# Patient Record
Sex: Female | Born: 1940 | Race: Black or African American | Hispanic: No | Marital: Married | Smoking: Former smoker
Health system: Southern US, Community
[De-identification: ages and names within clinical notes are randomized; demographics above are authoritative.]

## PROBLEM LIST (undated history)

## (undated) DIAGNOSIS — Z9289 Personal history of other medical treatment: Secondary | ICD-10-CM

## (undated) DIAGNOSIS — E785 Hyperlipidemia, unspecified: Secondary | ICD-10-CM

## (undated) DIAGNOSIS — I209 Angina pectoris, unspecified: Secondary | ICD-10-CM

## (undated) DIAGNOSIS — R002 Palpitations: Secondary | ICD-10-CM

## (undated) DIAGNOSIS — K635 Polyp of colon: Secondary | ICD-10-CM

## (undated) DIAGNOSIS — I509 Heart failure, unspecified: Secondary | ICD-10-CM

## (undated) DIAGNOSIS — I2510 Atherosclerotic heart disease of native coronary artery without angina pectoris: Secondary | ICD-10-CM

## (undated) DIAGNOSIS — F419 Anxiety disorder, unspecified: Secondary | ICD-10-CM

## (undated) DIAGNOSIS — I10 Essential (primary) hypertension: Secondary | ICD-10-CM

## (undated) DIAGNOSIS — N393 Stress incontinence (female) (male): Secondary | ICD-10-CM

## (undated) DIAGNOSIS — M1990 Unspecified osteoarthritis, unspecified site: Secondary | ICD-10-CM

## (undated) DIAGNOSIS — E669 Obesity, unspecified: Secondary | ICD-10-CM

## (undated) DIAGNOSIS — J189 Pneumonia, unspecified organism: Secondary | ICD-10-CM

## (undated) DIAGNOSIS — I351 Nonrheumatic aortic (valve) insufficiency: Secondary | ICD-10-CM

## (undated) DIAGNOSIS — K219 Gastro-esophageal reflux disease without esophagitis: Secondary | ICD-10-CM

## (undated) DIAGNOSIS — M7500 Adhesive capsulitis of unspecified shoulder: Secondary | ICD-10-CM

## (undated) DIAGNOSIS — I428 Other cardiomyopathies: Secondary | ICD-10-CM

## (undated) HISTORY — PX: TUBAL LIGATION: SHX77

## (undated) HISTORY — PX: EYE SURGERY: SHX253

## (undated) HISTORY — PX: OTHER SURGICAL HISTORY: SHX169

## (undated) HISTORY — PX: CARDIAC CATHETERIZATION: SHX172

## (undated) HISTORY — DX: Hyperlipidemia, unspecified: E78.5

## (undated) HISTORY — PX: APPENDECTOMY: SHX54

## (undated) HISTORY — DX: Personal history of other medical treatment: Z92.89

## (undated) HISTORY — DX: Other cardiomyopathies: I42.8

## (undated) HISTORY — DX: Essential (primary) hypertension: I10

## (undated) HISTORY — DX: Adhesive capsulitis of unspecified shoulder: M75.00

## (undated) HISTORY — DX: Unspecified osteoarthritis, unspecified site: M19.90

## (undated) HISTORY — DX: Obesity, unspecified: E66.9

## (undated) HISTORY — PX: HAND SURGERY: SHX662

## (undated) HISTORY — DX: Palpitations: R00.2

## (undated) HISTORY — PX: ABDOMINAL HYSTERECTOMY: SHX81

## (undated) HISTORY — DX: Atherosclerotic heart disease of native coronary artery without angina pectoris: I25.10

## (undated) HISTORY — DX: Nonrheumatic aortic (valve) insufficiency: I35.1

## (undated) HISTORY — DX: Polyp of colon: K63.5

## (undated) HISTORY — DX: Heart failure, unspecified: I50.9

---

## 1983-06-26 HISTORY — PX: BLADDER NECK SUSPENSION: SHX1240

## 1995-06-06 ENCOUNTER — Encounter: Payer: Self-pay | Admitting: Cardiovascular Disease

## 1998-09-12 ENCOUNTER — Other Ambulatory Visit: Admission: RE | Admit: 1998-09-12 | Discharge: 1998-09-12 | Payer: Self-pay | Admitting: Family Medicine

## 1998-10-10 ENCOUNTER — Ambulatory Visit (HOSPITAL_COMMUNITY): Admission: RE | Admit: 1998-10-10 | Discharge: 1998-10-10 | Payer: Self-pay | Admitting: *Deleted

## 1998-10-10 ENCOUNTER — Encounter: Payer: Self-pay | Admitting: *Deleted

## 1999-08-29 ENCOUNTER — Other Ambulatory Visit: Admission: RE | Admit: 1999-08-29 | Discharge: 1999-08-29 | Payer: Self-pay | Admitting: Family Medicine

## 1999-11-20 ENCOUNTER — Encounter: Payer: Self-pay | Admitting: Family Medicine

## 1999-11-20 ENCOUNTER — Encounter: Admission: RE | Admit: 1999-11-20 | Discharge: 1999-11-20 | Payer: Self-pay | Admitting: Family Medicine

## 2000-09-24 ENCOUNTER — Other Ambulatory Visit: Admission: RE | Admit: 2000-09-24 | Discharge: 2000-09-24 | Payer: Self-pay | Admitting: Family Medicine

## 2001-04-24 ENCOUNTER — Encounter: Payer: Self-pay | Admitting: Family Medicine

## 2001-04-24 ENCOUNTER — Encounter: Admission: RE | Admit: 2001-04-24 | Discharge: 2001-04-24 | Payer: Self-pay | Admitting: Family Medicine

## 2001-09-25 ENCOUNTER — Other Ambulatory Visit: Admission: RE | Admit: 2001-09-25 | Discharge: 2001-09-25 | Payer: Self-pay | Admitting: Family Medicine

## 2002-05-04 ENCOUNTER — Encounter: Admission: RE | Admit: 2002-05-04 | Discharge: 2002-05-04 | Payer: Self-pay | Admitting: Orthopedic Surgery

## 2002-05-04 ENCOUNTER — Encounter: Payer: Self-pay | Admitting: Orthopedic Surgery

## 2002-06-09 ENCOUNTER — Encounter: Payer: Self-pay | Admitting: Family Medicine

## 2002-06-09 ENCOUNTER — Encounter: Admission: RE | Admit: 2002-06-09 | Discharge: 2002-06-09 | Payer: Self-pay | Admitting: Family Medicine

## 2002-10-08 ENCOUNTER — Other Ambulatory Visit: Admission: RE | Admit: 2002-10-08 | Discharge: 2002-10-08 | Payer: Self-pay | Admitting: Family Medicine

## 2003-12-11 ENCOUNTER — Encounter: Admission: RE | Admit: 2003-12-11 | Discharge: 2003-12-11 | Payer: Self-pay | Admitting: Specialist

## 2003-12-23 ENCOUNTER — Other Ambulatory Visit: Admission: RE | Admit: 2003-12-23 | Discharge: 2003-12-23 | Payer: Self-pay | Admitting: Family Medicine

## 2003-12-23 ENCOUNTER — Encounter: Admission: RE | Admit: 2003-12-23 | Discharge: 2003-12-23 | Payer: Self-pay | Admitting: Family Medicine

## 2004-01-15 ENCOUNTER — Emergency Department (HOSPITAL_COMMUNITY): Admission: EM | Admit: 2004-01-15 | Discharge: 2004-01-15 | Payer: Self-pay | Admitting: Emergency Medicine

## 2004-02-09 ENCOUNTER — Encounter: Admission: RE | Admit: 2004-02-09 | Discharge: 2004-02-09 | Payer: Self-pay | Admitting: Family Medicine

## 2004-12-25 ENCOUNTER — Other Ambulatory Visit: Admission: RE | Admit: 2004-12-25 | Discharge: 2004-12-25 | Payer: Self-pay | Admitting: Family Medicine

## 2005-01-22 ENCOUNTER — Encounter: Admission: RE | Admit: 2005-01-22 | Discharge: 2005-01-22 | Payer: Self-pay | Admitting: Family Medicine

## 2005-03-02 ENCOUNTER — Ambulatory Visit: Admission: RE | Admit: 2005-03-02 | Discharge: 2005-03-02 | Payer: Self-pay | Admitting: Family Medicine

## 2005-03-14 ENCOUNTER — Encounter: Admission: RE | Admit: 2005-03-14 | Discharge: 2005-03-14 | Payer: Self-pay | Admitting: Orthopaedic Surgery

## 2005-06-29 ENCOUNTER — Encounter: Admission: RE | Admit: 2005-06-29 | Discharge: 2005-08-13 | Payer: Self-pay | Admitting: Orthopaedic Surgery

## 2005-07-13 ENCOUNTER — Ambulatory Visit (HOSPITAL_COMMUNITY): Admission: RE | Admit: 2005-07-13 | Discharge: 2005-07-13 | Payer: Self-pay | Admitting: Orthopaedic Surgery

## 2006-02-12 ENCOUNTER — Encounter: Admission: RE | Admit: 2006-02-12 | Discharge: 2006-02-12 | Payer: Self-pay | Admitting: Family Medicine

## 2007-02-14 ENCOUNTER — Encounter: Admission: RE | Admit: 2007-02-14 | Discharge: 2007-02-14 | Payer: Self-pay | Admitting: Family Medicine

## 2007-02-26 ENCOUNTER — Encounter: Admission: RE | Admit: 2007-02-26 | Discharge: 2007-02-26 | Payer: Self-pay | Admitting: Family Medicine

## 2008-04-20 ENCOUNTER — Encounter: Admission: RE | Admit: 2008-04-20 | Discharge: 2008-04-20 | Payer: Self-pay | Admitting: Family Medicine

## 2008-10-26 DIAGNOSIS — M159 Polyosteoarthritis, unspecified: Secondary | ICD-10-CM

## 2009-01-19 ENCOUNTER — Ambulatory Visit: Payer: Self-pay | Admitting: Gastroenterology

## 2009-02-01 ENCOUNTER — Encounter: Payer: Self-pay | Admitting: Gastroenterology

## 2009-02-01 ENCOUNTER — Ambulatory Visit: Payer: Self-pay | Admitting: Gastroenterology

## 2009-02-01 DIAGNOSIS — K635 Polyp of colon: Secondary | ICD-10-CM

## 2009-02-01 HISTORY — DX: Polyp of colon: K63.5

## 2009-02-03 ENCOUNTER — Encounter: Payer: Self-pay | Admitting: Gastroenterology

## 2009-04-21 ENCOUNTER — Encounter: Admission: RE | Admit: 2009-04-21 | Discharge: 2009-04-21 | Payer: Self-pay | Admitting: Family Medicine

## 2009-05-16 DIAGNOSIS — N318 Other neuromuscular dysfunction of bladder: Secondary | ICD-10-CM

## 2009-05-23 ENCOUNTER — Encounter: Admission: RE | Admit: 2009-05-23 | Discharge: 2009-05-23 | Payer: Self-pay | Admitting: Family Medicine

## 2009-09-04 ENCOUNTER — Inpatient Hospital Stay (HOSPITAL_COMMUNITY): Admission: EM | Admit: 2009-09-04 | Discharge: 2009-09-08 | Payer: Self-pay | Admitting: Emergency Medicine

## 2009-09-04 ENCOUNTER — Ambulatory Visit: Payer: Self-pay | Admitting: Cardiology

## 2009-09-05 ENCOUNTER — Encounter (INDEPENDENT_AMBULATORY_CARE_PROVIDER_SITE_OTHER): Payer: Self-pay | Admitting: Internal Medicine

## 2009-09-09 ENCOUNTER — Encounter: Payer: Self-pay | Admitting: Cardiology

## 2009-09-28 ENCOUNTER — Ambulatory Visit: Payer: Self-pay | Admitting: Cardiology

## 2009-09-28 DIAGNOSIS — I5032 Chronic diastolic (congestive) heart failure: Secondary | ICD-10-CM

## 2009-09-28 DIAGNOSIS — I359 Nonrheumatic aortic valve disorder, unspecified: Secondary | ICD-10-CM | POA: Insufficient documentation

## 2009-09-28 DIAGNOSIS — I2510 Atherosclerotic heart disease of native coronary artery without angina pectoris: Secondary | ICD-10-CM | POA: Insufficient documentation

## 2009-10-27 ENCOUNTER — Ambulatory Visit: Payer: Self-pay | Admitting: Cardiology

## 2009-11-01 LAB — CONVERTED CEMR LAB
AST: 14 units/L (ref 0–37)
Alkaline Phosphatase: 50 units/L (ref 39–117)
Bilirubin, Direct: 0.2 mg/dL (ref 0.0–0.3)
Cholesterol: 125 mg/dL (ref 0–200)
LDL Cholesterol: 53 mg/dL (ref 0–99)
Total CHOL/HDL Ratio: 2

## 2009-11-16 ENCOUNTER — Ambulatory Visit: Payer: Self-pay | Admitting: Cardiology

## 2009-11-24 ENCOUNTER — Telehealth: Payer: Self-pay | Admitting: Cardiology

## 2009-11-30 ENCOUNTER — Ambulatory Visit: Payer: Self-pay | Admitting: Cardiology

## 2009-11-30 DIAGNOSIS — R0989 Other specified symptoms and signs involving the circulatory and respiratory systems: Secondary | ICD-10-CM

## 2009-11-30 DIAGNOSIS — I517 Cardiomegaly: Secondary | ICD-10-CM

## 2009-11-30 DIAGNOSIS — R0609 Other forms of dyspnea: Secondary | ICD-10-CM | POA: Insufficient documentation

## 2009-12-06 LAB — CONVERTED CEMR LAB
BUN: 19 mg/dL (ref 6–23)
CO2: 31 meq/L (ref 19–32)
Creatinine, Ser: 0.6 mg/dL (ref 0.4–1.2)
GFR calc non Af Amer: 130.05 mL/min (ref >60)
Glucose, Bld: 107 mg/dL — ABNORMAL HIGH (ref 70–99)

## 2010-02-28 ENCOUNTER — Ambulatory Visit: Payer: Self-pay | Admitting: Internal Medicine

## 2010-02-28 ENCOUNTER — Ambulatory Visit: Payer: Self-pay

## 2010-02-28 ENCOUNTER — Encounter: Payer: Self-pay | Admitting: Cardiology

## 2010-02-28 ENCOUNTER — Ambulatory Visit (HOSPITAL_COMMUNITY): Admission: RE | Admit: 2010-02-28 | Discharge: 2010-02-28 | Payer: Self-pay | Admitting: Cardiology

## 2010-03-07 ENCOUNTER — Ambulatory Visit: Payer: Self-pay | Admitting: Cardiology

## 2010-03-07 DIAGNOSIS — I10 Essential (primary) hypertension: Secondary | ICD-10-CM

## 2010-03-07 DIAGNOSIS — E785 Hyperlipidemia, unspecified: Secondary | ICD-10-CM | POA: Insufficient documentation

## 2010-03-08 LAB — CONVERTED CEMR LAB
Chloride: 106 meq/L (ref 96–112)
GFR calc non Af Amer: 108.47 mL/min (ref >60)
Sodium: 142 meq/L (ref 135–145)

## 2010-03-23 DIAGNOSIS — I428 Other cardiomyopathies: Secondary | ICD-10-CM

## 2010-04-25 ENCOUNTER — Encounter: Admission: RE | Admit: 2010-04-25 | Discharge: 2010-04-25 | Payer: Self-pay | Admitting: Family Medicine

## 2010-04-26 ENCOUNTER — Ambulatory Visit: Payer: Self-pay | Admitting: Cardiology

## 2010-05-01 LAB — CONVERTED CEMR LAB
ALT: 12 units/L (ref 0–35)
Alkaline Phosphatase: 59 units/L (ref 39–117)
Cholesterol: 157 mg/dL (ref 0–200)
HDL: 60.5 mg/dL (ref >39.00)
LDL Cholesterol: 71 mg/dL (ref 0–99)
Triglycerides: 128 mg/dL (ref 0.0–149.0)
VLDL: 25.6 mg/dL (ref 0.0–40.0)

## 2010-05-08 ENCOUNTER — Ambulatory Visit: Payer: Self-pay | Admitting: Cardiology

## 2010-06-22 DIAGNOSIS — I10 Essential (primary) hypertension: Secondary | ICD-10-CM

## 2010-06-22 DIAGNOSIS — I2510 Atherosclerotic heart disease of native coronary artery without angina pectoris: Secondary | ICD-10-CM

## 2010-06-22 DIAGNOSIS — M949 Disorder of cartilage, unspecified: Secondary | ICD-10-CM

## 2010-06-22 DIAGNOSIS — M899 Disorder of bone, unspecified: Secondary | ICD-10-CM

## 2010-06-27 DIAGNOSIS — R7302 Impaired glucose tolerance (oral): Secondary | ICD-10-CM | POA: Insufficient documentation

## 2010-06-27 DIAGNOSIS — E119 Type 2 diabetes mellitus without complications: Secondary | ICD-10-CM | POA: Insufficient documentation

## 2010-07-23 LAB — CONVERTED CEMR LAB
CO2: 33 meq/L — ABNORMAL HIGH (ref 19–32)
Chloride: 102 meq/L (ref 96–112)
Glucose, Bld: 89 mg/dL (ref 70–99)
Pro B Natriuretic peptide (BNP): 75 pg/mL (ref 0.0–100.0)
Sodium: 141 meq/L (ref 135–145)

## 2010-07-27 NOTE — Assessment & Plan Note (Signed)
Summary: eph/per aft hrs sheet/jss   Visit Type:  eph Primary Provider:  Dr Maryelizabeth Rowan   History of Present Illness: 70 yo with history of HTN and obesity was admitted to [facility] in 3/11 with atypical chest pain and palpitations.  She ended up having a heart catheterization that showed nonobstructive CAD, but EF was decreased at 40%.  She was also diagnosed with diabetes. Her palpitations seemed to correlate with PACs on telemetry.  SInce discharge, she has done well.  No further chest pain.  She can walk on flat ground without getting short of breath but is short of breath if she walks up steps or a hill.  She is not very active.  She has been taking glipizide and blood glucose is reasonably controlled.    ECG: NSR, LVH, nonspecific anterolateral T wave flattening  Labs (3/11): K 4.3, creatinine 0.69, BNP 41, LDL 82, HDL 49  Current Medications (verified): 1)  Aspirin Ec 325 Mg Tbec (Aspirin) .... Take One Tablet By Mouth Daily 2)  Coreg 3.125 Mg Tabs (Carvedilol) .... One Tab Two Times A Day 3)  Furosemide 20 Mg Tabs (Furosemide) .... Once Daily 4)  Glipizide 5 Mg Tabs (Glipizide) .... Once Daily 5)  Lisinopril 5 Mg Tabs (Lisinopril) .... Once Daily 6)  Simvastatin 40 Mg Tabs (Simvastatin) .... Once Daily At Bedtime 7)  Calcium Carbonate 600 Mg Tabs (Calcium Carbonate) .... Once Daily 8)  Nixion .... Once Daily For The Hair 9)  Vit.d 500 .... Once Daily  Allergies: 1)  ! Iodine 2)  ! Penicillin  Past History:  Past Medical History: 1. Frozen right shoulder 2. Obesity 3. HTN 4. Diabetes 5. Hyperlipidemia 6. Palpitations: PACs noted on telemetry while in the hospital 7. Nonischemic cardiomyopathy: Echo (3/11): Difficult study, moderate aortic insufficiency noted.  Left ventriculogram (3/11): EF 40%, global hypokinesis.  8. Osteoarthritis 9. CAD (nonobstructive): Left heart cath (3/11): 40% ostial D1, 30% mid CFX.    Family History: Mother: CVA at 80 Siblings:  sister has diabetes and heart disease. Siblings: sister who is deceased in her 36s of an MI. Siblings: brother with  diabetes Father: deceased at the age of 13 with an MI.  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  Tobacco Use - No, quit 1970s.  Alcohol Use - yes Drug Use - no  Review of Systems       All systems reviewed and negative except as per HPI.   Vital Signs:  Patient profile:   70 year old female Height:      60 inches Weight:      196 pounds BMI:     38.42 Pulse rate:   62 / minute BP sitting:   111 / 59  (left arm) Cuff size:   large  Vitals Entered By: Oswald Hillock (September 28, 2009 11:34 AM)  Physical Exam  General:  Well developed, well nourished, in no acute distress.  Morbidly obese.  Head:  normocephalic and atraumatic Nose:  no deformity, discharge, inflammation, or lesions Mouth:  Teeth, gums and palate normal. Oral mucosa normal. Neck:  Neck thick, no JVD. No masses, thyromegaly or abnormal cervical nodes. Lungs:  Clear bilaterally to auscultation and percussion. Heart:  Non-displaced PMI, chest non-tender; regular rate and rhythm, S1, S2 without murmurs, rubs or gallops. Carotid upstroke normal, no bruit. Pedals normal pulses. Trace ankle edema.  Abdomen:  Bowel sounds positive; abdomen soft and non-tender without masses, organomegaly, or hernias noted. No  hepatosplenomegaly.  Obese.  Msk:  Back normal, normal gait. Muscle strength and tone normal. Extremities:  No clubbing or cyanosis. Neurologic:  Alert and oriented x 3. Skin:  Intact without lesions or rashes. Psych:  Normal affect.   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  Echo was a difficult study.  I certainly think that a significant part of the patient's dyspnea can be explained by obesity and deconditioning.  She will continue lisinopril 5 mg daily and  increase Coreg to 6.25 mg two times a day.  Will get echo in 9/11 to followup on EF and aortic insufficiency.   I have encouraged her to exercise more.  Will check BNP.   Problem # 2:  AORTIC REGURGITATION (ICD-424.1) Moderate on echo in 3/11.  Followup echo in 9/11.   Problem # 3:  CAD (ICD-414.00) Nonobstructive CAD on cath.  OK to decrease ASA to 81 mg daily.  Check lipids and LFTs in 5/11, would like LDL around 70.    Other Orders: Echocardiogram (Echo) TLB-BNP (B-Natriuretic Peptide) (83880-BNPR) TLB-BMP (Basic Metabolic Panel-BMET) (80048-METABOL)  Patient Instructions: 1)  Your physician has recommended you make the following change in your medication:  2)  Decrease Aspirin to 81mg  daily 3)  Increase Coreg to 6.25mg  twice a day 4)  Your physician recommends that you have  lab work today--BMP/BNP 428.22 5)  Your physician recommends that you return for a FASTING lipid profile/liver profile 428.22----MAY 2011 6)  Your physician recommends that you schedule a follow-up appointment in  September 2011 with Dr Veronda Prude few days after you have the echocardiogram. 7)  Your physician has requested that you have an echocardiogram.  Echocardiography is a painless test that uses sound waves to create images of your heart. It provides your doctor with information about the size and shape of your heart and how well your heart's chambers and valves are working.  This procedure takes approximately one hour. There are no restrictions for this procedure. September 2011 Prescriptions: GLIPIZIDE 5 MG TABS (GLIPIZIDE) once daily  #30 x 0   Entered by:   Katina Dung, RN, BSN   Authorized by:   Marca Ancona, MD   Signed by:   Katina Dung, RN, BSN on 09/28/2009   Method used:   Electronically to        [facility]. [phone]* (retail)       [address] [place] [address]       [place], [place]  [address]       Ph: [phone]       Fax: [phone]   RxID:    [id number] SIMVASTATIN 40 MG TABS (SIMVASTATIN) once daily at bedtime  #30 x 6   Entered by:   Katina Dung, RN, BSN   Authorized by:   Marca Ancona, MD   Signed by:   Katina Dung, RN, BSN on 09/28/2009   Method used:   Electronically to        [facility]. [phone]* (retail)       [address]       [place], [place]  [address]       Ph: [phone]       Fax: [phone]   RxID:   [id number] LISINOPRIL 5 MG TABS (LISINOPRIL) once daily  #30 x 6   Entered by:  Katina Dung, RN, BSN   Authorized by:   Marca Ancona, MD   Signed by:   Katina Dung, RN, BSN on 09/28/2009   Method used:   Electronically to        [facility] [phone]* (retail)       [address]       [place], [place]  [address]       Ph: [phone]       Fax: [phone]   RxID:   [id number] FUROSEMIDE 20 MG TABS (FUROSEMIDE) once daily  #30 x 6   Entered by:   Katina Dung, RN, BSN   Authorized by:   Marca Ancona, MD   Signed by:   Katina Dung, RN, BSN on 09/28/2009   Method used:   Electronically to        [facility] [phone]* (retail)       [address]       [place], [place]  [address]       Ph: [phone]       Fax: [phone]   RxID:   [id number] COREG 6.25 MG TABS (CARVEDILOL) one tablet twice a day  #60 x 6   Entered by:   Katina Dung, RN, BSN   Authorized by:   Marca Ancona, MD   Signed by:   Katina Dung, RN, BSN on 09/28/2009   Method used:   Electronically to        [facility] [phone]* (retail)       [address]       [place], [place]  [address]       Ph: [phone]       Fax: [phone]   RxID:   [id number]

## 2010-07-27 NOTE — Assessment & Plan Note (Signed)
Summary: F/U ECHO   Primary Provider:  Dr Maryelizabeth Rowan  CC:  f/u echo.  Pt believes Sertraline is making her itch.  .  History of Present Illness: 70 yo with history of HTN and obesity was admitted to [facility] in 3/11 with atypical chest pain and palpitations.  She ended up having a heart catheterization that showed nonobstructive CAD, but EF was decreased at 40%.  She was also diagnosed with diabetes. Her palpitations seemed to correlate with PACs on telemetry.  Echo in 9/11 has actually shown recovery of LV systolic function (EF 55-60%).  She is now taking losartan because she had a cough with lisinopril.    Patient is feeling better.  She has started going to [employer] and has been doing the exercise bike 3 times a week.  It is hard for her to walk due to her knee pain.  She is walking on flat ground without exertional dyspnea.  No chest pain.  Unfortunately, she has gained 7 lbs since last appointment.   Labs (3/11): K 4.3, creatinine 0.69, BNP 41, LDL 82, HDL 49 Labs (4/11): K 4.3, creatinine 0.7, BNP 75 Labs (5/11): LDL 53, HDL 52, LFTs normal Labs (1/61): creatinine 0.6, K 4.5  Current Medications (verified): 1)  Aspir-Low 81 Mg Tbec (Aspirin) .... One Daily 2)  Coreg 6.25 Mg Tabs (Carvedilol) .... One Tablet Twice A Day 3)  Simvastatin 40 Mg Tabs (Simvastatin) .... Once Daily At Bedtime 4)  Calcium Carbonate 600 Mg Tabs (Calcium Carbonate) .... Once Daily 5)  Vit.d 500 .... Once Daily 6)  Furosemide 20 Mg Tabs (Furosemide) .... One Daily 7)  Klor-Con M20 20 Meq Cr-Tabs (Potassium Chloride Crys Cr) .... 1/2 Tablet Once Daily 8)  Losartan Potassium 25 Mg Tabs (Losartan Potassium) .... Take 1/2 Tablet Once Daily 9)  Sertraline Hcl 100 Mg Tabs (Sertraline Hcl) .... Take One Tablet Once Daily  Allergies (verified): 1)  ! Iodine 2)  ! Penicillin  Past History:  Past Medical History: 1. Frozen shoulder 2. Obesity 3. HTN: ACEI cough.  4. Diabetes 5. Hyperlipidemia 6.  Palpitations: PACs noted on telemetry while in the hospital 7. Nonischemic cardiomyopathy: Echo (3/11): Difficult study, moderate aortic insufficiency noted.  Left ventriculogram (3/11): EF 40%, global hypokinesis.  Echo (9/11): EF 55-60%, moderate diastolic dysfunction, mild aortic insufficiency.  8. Osteoarthritis 9. CAD (nonobstructive): Left heart cath (3/11): 40% ostial D1, 30% mid CFX.   10. Aortic insufficiency: Mild on 9/11 cath.   Family History: Reviewed history from 09/28/2009 and no changes required. Mother: CVA at 33 Siblings: sister has diabetes and heart disease. Siblings: sister who is deceased in her 32s of an MI. Siblings: brother with  diabetes Father: deceased at the age of 24 with an MI.  Social History: Reviewed history from 09/28/2009 and no changes required. 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  Tobacco Use - No, quit 1970s.  Alcohol Use - yes Drug Use - no  Review of Systems       All systems reviewed and negative except as per HPI.   Vital Signs:  Patient profile:   70 year old female Height:      60 inches Weight:      227 pounds BMI:     44.49 Pulse rate:   64 / minute Pulse rhythm:   regular BP sitting:   118 / 60  (left arm) Cuff size:   large  Vitals Entered By: Judithe Modest CMA (March 07, 2010 9:29  AM)  Physical Exam  General:  Well developed, well nourished, in no acute distress.  Morbidly obese.  Mouth:  post nasal drip.   Neck:  Neck thick, JVP difficult. No masses, thyromegaly or abnormal cervical nodes. Lungs:  Clear bilaterally to auscultation and percussion. Heart:  Non-displaced PMI, chest non-tender; regular rate and rhythm, S1, S2 without rubs or gallops. 2/6 systolic murmur RUSB.  Carotid upstroke normal, no bruit. Pedals normal pulses. No edema.  Abdomen:  Bowel sounds positive; abdomen soft and non-tender without masses, organomegaly, or hernias noted. No hepatosplenomegaly. Extremities:  No clubbing  or cyanosis. Neurologic:  Alert and oriented x 3.   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 This may be due to long-standing HTN and DM.   I certainly think that a significant part of the patient's dyspnea and exhaustion can be explained by obesity and deconditioning.  Neck veins are difficult to evaluate.  Most recent echo showed improvement in EF (55-60%).  I will have her continue current doses of Coreg,  Losartan, and Lasix. BMET today to assess renal function on cardiac meds.   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   Problem # 3:  HYPERLIPIDEMIA-MIXED (ICD-272.4) Lipids/LFTs in 11/11.  LDL should be at least below 100.   Problem # 4:  HYPERTENSION, UNSPECIFIED (ICD-401.9) BP is well-controlled today.   Other Orders: TLB-BMP (Basic Metabolic Panel-BMET) (80048-METABOL)  Patient Instructions: 1)  Your physician recommends that you schedule a follow-up appointment in: 6 MONTHS WITH DR [facility] 2)  Your physician recommends that you return for lab work ZO:XWRUE BMET 428.32 AND NOV 2012 FASTIN LIPID LIVER 272.4 V58.69 3)  Your physician recommends that you continue on your current medications as directed. Please refer to the Current Medication list given to you today.

## 2010-07-27 NOTE — Assessment & Plan Note (Signed)
Summary: ROV / appt is 11:45/gd   Primary Provider:  Dr Maryelizabeth Rowan   History of Present Illness: 70 yo with history of HTN and obesity was admitted to [facility] in 3/11 with atypical chest pain and palpitations.  She ended up having a heart catheterization that showed nonobstructive CAD, but EF was decreased at 40%.  She was also diagnosed with diabetes. Her palpitations seemed to correlate with PACs on telemetry.    Today patient reports chest heaviness both with and without exertion.  This does seem to be associated with left shoulder pain (she has a frozen shoulder with musculoskeletal shoulder pain).  She is easily fatigued and feels exhausted doing housework such as making up a bed.  She is not very active (significantly limited by her knee osteoarthritis).    ECG: NSR, LVH, nonspecific anterolateral T wave flattening  Labs (3/11): K 4.3, creatinine 0.69, BNP 41, LDL 82, HDL 49 Labs (4/11): K 4.3, creatinine 0.7, BNP 75 Labs (5/11): LDL 53, HDL 52, LFTs normal  Current Medications (verified): 1)  Aspir-Low 81 Mg Tbec (Aspirin) .... One Daily 2)  Coreg 6.25 Mg Tabs (Carvedilol) .... One Tablet Twice A Day 3)  Furosemide 20 Mg Tabs (Furosemide) .... Once Daily 4)  Glipizide 5 Mg Tabs (Glipizide) .... Once Daily 5)  Lisinopril 5 Mg Tabs (Lisinopril) .... Once Daily 6)  Simvastatin 40 Mg Tabs (Simvastatin) .... Once Daily At Bedtime 7)  Calcium Carbonate 600 Mg Tabs (Calcium Carbonate) .... Once Daily 8)  Vit.d 500 .... Once Daily  Allergies (verified): 1)  ! Iodine 2)  ! Penicillin  Past History:  Past Medical History: 1. Frozen shoulder 2. Obesity 3. HTN 4. Diabetes 5. Hyperlipidemia 6. Palpitations: PACs noted on telemetry while in the hospital 7. Nonischemic cardiomyopathy: Echo (3/11): Difficult study, moderate aortic insufficiency noted.  Left ventriculogram (3/11): EF 40%, global hypokinesis.  8. Osteoarthritis 9. CAD (nonobstructive): Left heart cath (3/11):  40% ostial D1, 30% mid CFX.   10.  Moderate aortic insufficiency  Family History: Reviewed history from 09/28/2009 and no changes required. Mother: CVA at 18 Siblings: sister has diabetes and heart disease. Siblings: sister who is deceased in her 76s of an MI. Siblings: brother with  diabetes Father: deceased at the age of 70 with an MI.  Social History: Reviewed history from 09/28/2009 and no changes required. 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  Tobacco Use - No, quit 1970s.  Alcohol Use - yes Drug Use - no  Review of Systems       All systems reviewed and negative except as per HPI.   Vital Signs:  Patient profile:   70 year old female Height:      60 inches Weight:      220 pounds BMI:     43.12 Pulse rate:   63 / minute Pulse rhythm:   regular BP sitting:   95 / 55  (left arm) Cuff size:   regular  Vitals Entered By: Judithe Modest CMA (Nov 16, 2009 12:09 PM)  Physical Exam  General:  Well developed, well nourished, in no acute distress.  Morbidly obese.  Neck:  Neck thick, JVP difficult. No masses, thyromegaly or abnormal cervical nodes. Lungs:  Clear bilaterally to auscultation and percussion. Heart:  Non-displaced PMI, chest non-tender; regular rate and rhythm, S1, S2 without rubs or gallops. 2/6 systolic murmur RUSB.  Carotid upstroke normal, no bruit. Pedals normal pulses. 1+ edema 1/2 up lower legs bilaterally.  Abdomen:  Bowel sounds positive; abdomen soft and non-tender without masses, organomegaly, or hernias noted. No hepatosplenomegaly.  Obese.  Extremities:  No clubbing or cyanosis. Neurologic:  Alert and oriented x 3. Psych:  Normal affect.   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  Echo was a difficult study.  I certainly think that a significant part of the patient's dyspnea and exhaustion can be explained by obesity and  deconditioning.  Neck veins are difficult to evaluate.  She does, however, have increased lower extremity edema compared to prior.  I will have her increase her Lasix to 40 mg daily and take KCl 20 mEq daily to see if this makes any difference in her symptoms.  She needs weight loss.  Continue current doses of lisinopril and Coreg (will not titrate up given low blood pressure).  BMET in 2 wks.   Problem # 2:  AORTIC REGURGITATION (ICD-424.1) Moderate on echo in 3/11.  Followup echo in 9/11.  BP is controlled.   Problem # 3:  CAD (ICD-414.00) Nonobstructive CAD on cath.  Continue ASA.  Lipids/LFTs at goal.  I suspect that the chest heaviness she gets is not ischemic in nature.  It may be musculoskeletal and referred from her painful shoulder.   Patient Instructions: 1)  Your physician has recommended you make the following change in your medication:  2)  Increase Lasix(furosemide) to 40mg  daily 3)  Start KCL(potassium) daily 4)  Your physician recommends that you return for lab work in: 2 weeks--BMP 786.09   429.3 5)  Your physician has requested that you have an echocardiogram.  Echocardiography is a painless test that uses sound waves to create images of your heart. It provides your doctor with information about the size and shape of your heart and how well your heart's chambers and valves are working.  This procedure takes approximately one hour. There are no restrictions for this procedure. This is scheduled for September 6,2011 at 3pm. 6)  Your physician recommends that you schedule a follow-up appointment with Dr Shirlee Latch in September after the echo has been done. Prescriptions: KLOR-CON M20 20 MEQ CR-TABS (POTASSIUM CHLORIDE CRYS CR) one tablet daily  #30 x 6   Entered by:   Katina Dung, RN, BSN   Authorized by:   Marca Ancona, MD   Signed by:   Katina Dung, RN, BSN on 11/16/2009   Method used:   Electronically to        [facility]. [phone]* (retail)       [address]       [place], [place]  [address]       Ph: [phone]       Fax: [phone]   RxID:   [id number] FUROSEMIDE 40 MG TABS (FUROSEMIDE) one tablet daily  #30 x 6   Entered by:   Katina Dung, RN, BSN   Authorized by:   Marca Ancona, MD   Signed by:   Katina Dung, RN, BSN on 11/16/2009   Method used:   Electronically to        [facility]. [phone]* (retail)       [address]       [place], [place]  [address]       Ph: [phone]       Fax: [phone]   RxID:  [id number]   

## 2010-07-27 NOTE — Progress Notes (Signed)
Summary: pt has question reagrding her meds  Phone Note Call from Patient Call back at [phone]   Caller: Patient Reason for Call: Talk to Nurse, Talk to Doctor Summary of Call: pt has a question regarding medication Initial call taken by: Omer Jack,  November 24, 2009 1:17 PM  Follow-up for Phone Call        discussed with pt --pt states she feels like she cannot tolerate higher dose of Lasix -it makes her feel bad and she cannot go anywhere because she has to urinate so frequently on higher dose--she has only taken higher dose of Lasix 2 times--she is unsure about any change in her weight--I will review with Dr Shirlee Latch      Appended Document: pt has question reagrding her meds Decrease Lasix back to 20 mg daily and KCl to 10 mEq daily.   Appended Document: pt has question reagrding her meds LMTCB   Appended Document: pt has question reagrding her meds talked with pt by telephone--she will decrease Lasix to 20mg  and KCL to 10 mEq-she is schedued for BMP 11-30-09   Clinical Lists Changes  Medications: Changed medication from FUROSEMIDE 40 MG TABS (FUROSEMIDE) one tablet daily to FUROSEMIDE 20 MG TABS (FUROSEMIDE) one daily Changed medication from KLOR-CON M20 20 MEQ CR-TABS (POTASSIUM CHLORIDE CRYS CR) one tablet daily to KLOR-CON M10 10 MEQ CR-TABS (POTASSIUM CHLORIDE CRYS CR) one daily

## 2010-08-29 ENCOUNTER — Ambulatory Visit (INDEPENDENT_AMBULATORY_CARE_PROVIDER_SITE_OTHER): Payer: Medicare Other | Admitting: Cardiology

## 2010-08-29 ENCOUNTER — Encounter: Payer: Self-pay | Admitting: Cardiology

## 2010-08-29 DIAGNOSIS — I5032 Chronic diastolic (congestive) heart failure: Secondary | ICD-10-CM

## 2010-09-05 NOTE — Assessment & Plan Note (Signed)
Summary: 6 MO F/U/CY/AMD   Visit Type:  Follow-up Primary Provider:  Dr Maryelizabeth Rowan  CC:  No complaints.  History of Present Illness: 70 yo with history of HTN and obesity was admitted to [facility] in 3/11 with atypical chest pain and palpitations.  She ended up having a heart catheterization that showed nonobstructive CAD, but EF was decreased at 40%.  She was also diagnosed with diabetes. Her palpitations seemed to correlate with PACs on telemetry.  Echo in 9/11 has actually shown recovery of LV systolic function (EF 55-60%).  She is now taking losartan because she had a cough with lisinopril.    Patient has been doing well.  Weight is down 10 lbs compared to prior appointment.  She has been going to the [facility] and is walking 1.5 miles on the treadmill 4 days/week.  No exertional dyspnea or chest pain.   Labs (3/11): K 4.3, creatinine 0.69, BNP 41, LDL 82, HDL 49 Labs (4/11): K 4.3, creatinine 0.7, BNP 75 Labs (5/11): LDL 53, HDL 52, LFTs normal Labs (1/60): creatinine 0.6, K 4.5 Labs (9/11): K 4.4, creatinine 0.7 Labs (11/11): LDL 71, HDL 60, TGs 128, LFTs normal  ECG: NSR, LVH, nonspecific T wave flattening  Current Medications (verified): 1)  Aspir-Low 81 Mg Tbec (Aspirin) .... One Daily 2)  Coreg 6.25 Mg Tabs (Carvedilol) .... One Tablet Twice A Day 3)  Simvastatin 40 Mg Tabs (Simvastatin) .... Once Daily At Bedtime 4)  Calcium Carbonate 600 Mg Tabs (Calcium Carbonate) .... Once Daily 5)  Vit.d 500 .... Once Daily 6)  Furosemide 20 Mg Tabs (Furosemide) .... One Daily 7)  Klor-Con M20 20 Meq Cr-Tabs (Potassium Chloride Crys Cr) .... 1/2 Tablet Once Daily 8)  Losartan Potassium 25 Mg Tabs (Losartan Potassium) .... Take 1/2 Tablet Once Daily 9)  Sertraline Hcl 100 Mg Tabs (Sertraline Hcl) .... Take One Tablet Once Daily  Allergies: 1)  ! Iodine 2)  ! Penicillin  Past History:  Past Medical History: Reviewed history from 03/07/2010 and no changes required. 1. Frozen  shoulder 2. Obesity 3. HTN: ACEI cough.  4. Diabetes 5. Hyperlipidemia 6. Palpitations: PACs noted on telemetry while in the hospital 7. Nonischemic cardiomyopathy: Echo (3/11): Difficult study, moderate aortic insufficiency noted.  Left ventriculogram (3/11): EF 40%, global hypokinesis.  Echo (9/11): EF 55-60%, moderate diastolic dysfunction, mild aortic insufficiency.  8. Osteoarthritis 9. CAD (nonobstructive): Left heart cath (3/11): 40% ostial D1, 30% mid CFX.   10. Aortic insufficiency: Mild on 9/11 cath.   Family History: Reviewed history from 09/28/2009 and no changes required. Mother: CVA at 69 Siblings: sister has diabetes and heart disease. Siblings: sister who is deceased in her 26s of an MI. Siblings: brother with  diabetes Father: deceased at the age of 61 with an MI.  Social History: Reviewed history from 09/28/2009 and no changes required. 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  Tobacco Use - No, quit 1970s.  Alcohol Use - yes Drug Use - no  Vital Signs:  Patient profile:   70 year old female Height:      60 inches Weight:      216.25 pounds BMI:     42.39 Pulse rate:   56 / minute Pulse rhythm:   irregular Resp:     18 per minute BP sitting:   130 / 80  (left arm) Cuff size:   large  Vitals Entered By: Vikki Ports (August 29, 2010 10:30 AM)  Physical Exam  General:  Well developed, well nourished, in no acute distress.  Obese.  Neck:  Neck supple, no JVD. No masses, thyromegaly or abnormal cervical nodes. Lungs:  Clear bilaterally to auscultation and percussion. Heart:  Non-displaced PMI, chest non-tender; regular rate and rhythm, S1, S2 without rubs or gallops. 1/6 systolic murmur RUSB.  Carotid upstroke normal, no bruit. Pedals normal pulses. No edema.  Abdomen:  Bowel sounds positive; abdomen soft and non-tender without masses, organomegaly, or hernias noted. No hepatosplenomegaly. Extremities:  No clubbing or  cyanosis. Neurologic:  Alert and oriented x 3. Psych:  Normal affect.   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 This may have been due to long-standing HTN and DM.  Most recent echo showed improvement in EF (55-60%).  I will have her continue current doses of Coreg,  Losartan, and Lasix.  She is symptomatically improved and getting more exercise.   Problem # 2:  HYPERTENSION, UNSPECIFIED (ICD-401.9) BP is under good control on current meds.   Problem # 3:  HYPERLIPIDEMIA-MIXED (ICD-272.4) LDL close to goal (< 70) when last checked.   Patient Instructions: 1)  Your physician wants you to follow-up in: 1 year with Dr Shirlee Latch.[facility] 2013)  You will receive a reminder letter in the mail two months in advance. If you don't receive a letter, please call our office to schedule the follow-up appointment.

## 2010-09-18 LAB — CBC
HCT: 38.3 % (ref 36.0–46.0)
HCT: 41.5 % (ref 36.0–46.0)
Hemoglobin: 12.4 g/dL (ref 12.0–15.0)
Hemoglobin: 12.5 g/dL (ref 12.0–15.0)
Hemoglobin: 14.1 g/dL (ref 12.0–15.0)
MCHC: 33.8 g/dL (ref 30.0–36.0)
MCV: 90.3 fL (ref 78.0–100.0)
MCV: 90.3 fL (ref 78.0–100.0)
Platelets: 252 10*3/uL (ref 150–400)
RBC: 4.03 MIL/uL (ref 3.87–5.11)
RBC: 4.6 MIL/uL (ref 3.87–5.11)
RDW: 13.7 % (ref 11.5–15.5)
WBC: 11.6 10*3/uL — ABNORMAL HIGH (ref 4.0–10.5)

## 2010-09-18 LAB — BASIC METABOLIC PANEL WITH GFR
BUN: 21 mg/dL (ref 6–23)
BUN: 21 mg/dL (ref 6–23)
CO2: 25 meq/L (ref 19–32)
CO2: 25 meq/L (ref 19–32)
Calcium: 8.8 mg/dL (ref 8.4–10.5)
Calcium: 9.7 mg/dL (ref 8.4–10.5)
Chloride: 106 meq/L (ref 96–112)
Chloride: 106 meq/L (ref 96–112)
Creatinine, Ser: 0.69 mg/dL (ref 0.4–1.2)
Creatinine, Ser: 0.69 mg/dL (ref 0.4–1.2)
GFR calc non Af Amer: >60 mL/min
GFR calc non Af Amer: >60 mL/min
Glucose, Bld: 113 mg/dL — ABNORMAL HIGH (ref 70–99)
Glucose, Bld: 172 mg/dL — ABNORMAL HIGH (ref 70–99)
Potassium: 4.2 meq/L (ref 3.5–5.1)
Potassium: 4.3 meq/L (ref 3.5–5.1)
Sodium: 136 meq/L (ref 135–145)
Sodium: 137 meq/L (ref 135–145)

## 2010-09-18 LAB — CARDIAC PANEL(CRET KIN+CKTOT+MB+TROPI)
CK, MB: 1.2 ng/mL (ref 0.3–4.0)
CK, MB: 1.3 ng/mL (ref 0.3–4.0)
CK, MB: 1.4 ng/mL (ref 0.3–4.0)
Relative Index: 1.3 (ref 0.0–2.5)
Relative Index: INVALID (ref 0.0–2.5)
Relative Index: INVALID (ref 0.0–2.5)
Total CK: 111 U/L (ref 7–177)
Total CK: 85 U/L (ref 7–177)
Total CK: 86 U/L (ref 7–177)
Troponin I: 0.02 ng/mL (ref 0.00–0.06)

## 2010-09-18 LAB — POCT I-STAT, CHEM 8
BUN: 24 mg/dL — ABNORMAL HIGH (ref 6–23)
Chloride: 107 mEq/L (ref 96–112)
Creatinine, Ser: 0.8 mg/dL (ref 0.4–1.2)
Hemoglobin: 12.2 g/dL (ref 12.0–15.0)
Potassium: 3.8 mEq/L (ref 3.5–5.1)
Sodium: 138 mEq/L (ref 135–145)

## 2010-09-18 LAB — BASIC METABOLIC PANEL
CO2: 27 mEq/L (ref 19–32)
Chloride: 104 mEq/L (ref 96–112)
GFR calc Af Amer: >60 mL/min (ref >60)
Sodium: 136 mEq/L (ref 135–145)

## 2010-09-18 LAB — GLUCOSE, CAPILLARY
Glucose-Capillary: 114 mg/dL — ABNORMAL HIGH (ref 70–99)
Glucose-Capillary: 119 mg/dL — ABNORMAL HIGH (ref 70–99)
Glucose-Capillary: 131 mg/dL — ABNORMAL HIGH (ref 70–99)
Glucose-Capillary: 134 mg/dL — ABNORMAL HIGH (ref 70–99)
Glucose-Capillary: 134 mg/dL — ABNORMAL HIGH (ref 70–99)
Glucose-Capillary: 150 mg/dL — ABNORMAL HIGH (ref 70–99)
Glucose-Capillary: 162 mg/dL — ABNORMAL HIGH (ref 70–99)
Glucose-Capillary: 171 mg/dL — ABNORMAL HIGH (ref 70–99)
Glucose-Capillary: 237 mg/dL — ABNORMAL HIGH (ref 70–99)
Glucose-Capillary: 94 mg/dL (ref 70–99)
Glucose-Capillary: 95 mg/dL (ref 70–99)

## 2010-09-18 LAB — COMPREHENSIVE METABOLIC PANEL
AST: 22 U/L (ref 0–37)
Albumin: 3.8 g/dL (ref 3.5–5.2)
Calcium: 9.1 mg/dL (ref 8.4–10.5)
Creatinine, Ser: 0.72 mg/dL (ref 0.4–1.2)
GFR calc Af Amer: >60 mL/min (ref >60)
Total Protein: 7 g/dL (ref 6.0–8.3)

## 2010-09-18 LAB — POCT CARDIAC MARKERS
Myoglobin, poc: 60.6 ng/mL (ref 12–200)
Troponin i, poc: <0.05 ng/mL (ref 0.00–0.09)

## 2010-09-18 LAB — TSH: TSH: 1.101 u[IU]/mL (ref 0.350–4.500)

## 2010-09-18 LAB — PROTIME-INR
INR: 0.98 (ref 0.00–1.49)
Prothrombin Time: 12.9 s (ref 11.6–15.2)

## 2010-09-18 LAB — LIPID PANEL
Cholesterol: 150 mg/dL (ref 0–200)
LDL Cholesterol: 82 mg/dL (ref 0–99)
Triglycerides: 93 mg/dL (ref <150)

## 2010-09-18 LAB — BRAIN NATRIURETIC PEPTIDE: Pro B Natriuretic peptide (BNP): 41 pg/mL (ref 0.0–100.0)

## 2010-12-06 ENCOUNTER — Other Ambulatory Visit: Payer: Self-pay | Admitting: Orthopedic Surgery

## 2010-12-06 DIAGNOSIS — M545 Low back pain: Secondary | ICD-10-CM

## 2010-12-13 ENCOUNTER — Ambulatory Visit
Admission: RE | Admit: 2010-12-13 | Discharge: 2010-12-13 | Disposition: A | Discharge status: Home / Self Care | Payer: Medicare Other | Source: Ambulatory Visit | Attending: Orthopedic Surgery | Admitting: Orthopedic Surgery

## 2010-12-13 DIAGNOSIS — M545 Low back pain: Secondary | ICD-10-CM

## 2011-01-22 ENCOUNTER — Encounter: Payer: Self-pay | Admitting: Cardiology

## 2011-01-23 ENCOUNTER — Encounter: Payer: Self-pay | Admitting: Cardiology

## 2011-01-23 ENCOUNTER — Ambulatory Visit (INDEPENDENT_AMBULATORY_CARE_PROVIDER_SITE_OTHER): Payer: Medicare Other | Admitting: Cardiology

## 2011-01-23 DIAGNOSIS — R0600 Dyspnea, unspecified: Secondary | ICD-10-CM

## 2011-01-23 DIAGNOSIS — E785 Hyperlipidemia, unspecified: Secondary | ICD-10-CM

## 2011-01-23 DIAGNOSIS — R42 Dizziness and giddiness: Secondary | ICD-10-CM

## 2011-01-23 DIAGNOSIS — R0989 Other specified symptoms and signs involving the circulatory and respiratory systems: Secondary | ICD-10-CM

## 2011-01-23 DIAGNOSIS — R0609 Other forms of dyspnea: Secondary | ICD-10-CM

## 2011-01-23 LAB — LIPID PANEL
Cholesterol: 158 mg/dL (ref 0–200)
HDL: 65.8 mg/dL (ref >39.00)
Total CHOL/HDL Ratio: 2
Triglycerides: 140 mg/dL (ref 0.0–149.0)

## 2011-01-23 LAB — CBC WITH DIFFERENTIAL/PLATELET
Basophils Absolute: 0 10*3/uL (ref 0.0–0.1)
Basophils Relative: 0.5 % (ref 0.0–3.0)
Eosinophils Absolute: 0.3 10*3/uL (ref 0.0–0.7)
Lymphocytes Relative: 16.7 % (ref 12.0–46.0)
MCHC: 31.8 g/dL (ref 30.0–36.0)
Neutrophils Relative %: 70.9 % (ref 43.0–77.0)
Platelets: 258 10*3/uL (ref 150.0–400.0)
RBC: 4.31 Mil/uL (ref 3.87–5.11)
WBC: 9.4 10*3/uL (ref 4.5–10.5)

## 2011-01-23 LAB — BASIC METABOLIC PANEL
BUN: 22 mg/dL (ref 6–23)
CO2: 27 mEq/L (ref 19–32)
Calcium: 9.2 mg/dL (ref 8.4–10.5)
Chloride: 101 mEq/L (ref 96–112)
Creatinine, Ser: 0.6 mg/dL (ref 0.4–1.2)
Glucose, Bld: 98 mg/dL (ref 70–99)

## 2011-01-23 LAB — TSH: TSH: 1.86 u[IU]/mL (ref 0.35–5.50)

## 2011-01-23 MED ORDER — MECLIZINE HCL 25 MG PO TABS: ORAL_TABLET | ORAL | Status: DC

## 2011-01-23 NOTE — Assessment & Plan Note (Signed)
Symptoms sound like vertigo, possibly positional vertigo.  She is not orthostatic.  I will have her try meclizine to see if this can suppress the symptoms.

## 2011-01-23 NOTE — Assessment & Plan Note (Addendum)
I will check lipids/LFTs on simvastatin.   I encouraged her to increase her exercise level.

## 2011-01-23 NOTE — Patient Instructions (Signed)
Lab today--lipid profile/BMP/BNP/CBC/TSH 786.09  428.32    Use meclizine 25mg  every 8 hours as needed for dizziness.  Do not drive if you use this medication.   Schedule an appointment for an echocardiogram  Schedule an appointment with Dr Shirlee Latch in about 4 weeks.

## 2011-01-23 NOTE — Assessment & Plan Note (Signed)
Patient reports increased dyspnea with exertion.  She does not appear volume overloaded on exam.  She has gained 25 lbs since last appointment.  I suspect that obesity/deconditioning is playing a large role in her symptoms.  I will get an echo to make sure that EF remains preserved.  I will also check TSH and BNP.

## 2011-01-23 NOTE — Progress Notes (Signed)
PCP: Dr. Duanne Guess  70 yo with history of HTN and obesity was admitted to [facility] in 3/11 with atypical chest pain and palpitations. She ended up having a heart catheterization that showed nonobstructive CAD, but EF was decreased at 40%. She was also diagnosed with diabetes. Her palpitations seemed to correlate with PACs on telemetry. Echo in 9/11 showed recovery of LV systolic function (EF 55-60%).   Over the last month or so, patient has had frequent "dizzy spells".  She describes these episodes as "spinning" sensations.  She tends to get episodes in the morning.  They often occur with particular head movements.  She was not orthostatic when we checked in the office today.  She also feels in general tired and fatigued.  She has episodes of right-sided chest heaviness that are not related to exertion.  She is more short of breath with exertion.  She has not been exercising in the last month because she has not felt well.  She also chronic low back pain from spinal stenosis.  Weight is up 24 lbs since last appointment.   Labs (3/11): K 4.3, creatinine 0.69, BNP 41, LDL 82, HDL 49  Labs (4/11): K 4.3, creatinine 0.7, BNP 75  Labs (5/11): LDL 53, HDL 52, LFTs normal  Labs (6/11): creatinine 0.6, K 4.5  Labs (9/11): K 4.4, creatinine 0.7  Labs (11/11): LDL 71, HDL 60, TGs 128, LFTs normal   ECG: NSR with PAC, LVH, nonspecific anterolateral T wave flattening   Allergies:  1) ! Iodine  2) ! Penicillin   Past Medical History:  1. Frozen shoulder  2. Obesity  3. HTN: ACEI cough.  4. Diabetes  5. Hyperlipidemia  6. Palpitations: PACs noted on telemetry while in the hospital  7. Nonischemic cardiomyopathy: Echo (3/11): Difficult study, moderate aortic insufficiency noted. Left ventriculogram (3/11): EF 40%, global hypokinesis. Echo (9/11): EF 55-60%, moderate diastolic dysfunction, mild aortic insufficiency.  8. Osteoarthritis  9. CAD (nonobstructive): Left heart cath (3/11): 40% ostial D1, 30% mid  CFX.  10. Aortic insufficiency: Mild on 9/11 cath.   Family History:  Mother: CVA at 71  Siblings: sister has diabetes and heart disease.  Siblings: sister who is deceased in her 63s of an MI.  Siblings: brother with diabetes  Father: deceased at the age of 10 with an MI.   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  Tobacco Use - No, quit 1970s.  Alcohol Use - yes  Drug Use - no  ROS: All systems reviewed and negative except as per HPI.   Current Outpatient Prescriptions  Medication Sig Dispense Refill  . aspirin 81 MG tablet Take 81 mg by mouth daily.        . calcium carbonate (OS-CAL) 600 MG TABS Take 600 mg by mouth daily.        . carvedilol (COREG) 6.25 MG tablet Take 6.25 mg by mouth 2 (two) times daily.        . Cholecalciferol (VITAMIN D) 1000 UNITS capsule Take 1,000 Units by mouth daily.        . furosemide (LASIX) 20 MG tablet Take 20 mg by mouth daily.        Marland Kitchen loratadine (CLARITIN) 10 MG tablet Take 10 mg by mouth daily.        Marland Kitchen losartan (COZAAR) 25 MG tablet Take 12.5 mg by mouth daily.        . potassium chloride SA (KLOR-CON M20) 20 MEQ tablet Take 10  mEq by mouth daily.        . sertraline (ZOLOFT) 100 MG tablet Take 100 mg by mouth daily.        . simvastatin (ZOCOR) 40 MG tablet Take 40 mg by mouth at bedtime.        . meclizine (ANTIVERT) 25 MG tablet Take one every 8 hours as needed for dizziness  30 tablet  0    BP 137/69  Pulse 70  Resp 18  Ht 5' (1.524 m)  Wt 240 lb (108.863 kg)  BMI 46.87 kg/m2 General: NAD, obese.  Neck: JVP 7-8 cm, no thyromegaly or thyroid nodule.  Lungs: Clear to auscultation bilaterally with normal respiratory effort. CV: Nondisplaced PMI.  Heart regular S1/S2, no S3/S4, no murmur.  No peripheral edema.  No carotid bruit.  Normal pedal pulses.  Abdomen: Soft, nontender, no hepatosplenomegaly, no distention.  Neurologic: Alert and oriented x 3.  Psych: Normal affect. Extremities: No clubbing  or cyanosis.

## 2011-01-24 ENCOUNTER — Ambulatory Visit (HOSPITAL_COMMUNITY): Payer: Medicare Other | Attending: Cardiology | Admitting: Radiology

## 2011-01-24 DIAGNOSIS — R072 Precordial pain: Secondary | ICD-10-CM | POA: Insufficient documentation

## 2011-01-24 DIAGNOSIS — I10 Essential (primary) hypertension: Secondary | ICD-10-CM | POA: Insufficient documentation

## 2011-01-24 DIAGNOSIS — I517 Cardiomegaly: Secondary | ICD-10-CM | POA: Insufficient documentation

## 2011-01-24 DIAGNOSIS — I2510 Atherosclerotic heart disease of native coronary artery without angina pectoris: Secondary | ICD-10-CM | POA: Insufficient documentation

## 2011-01-24 DIAGNOSIS — E669 Obesity, unspecified: Secondary | ICD-10-CM | POA: Insufficient documentation

## 2011-01-24 DIAGNOSIS — Z87891 Personal history of nicotine dependence: Secondary | ICD-10-CM | POA: Insufficient documentation

## 2011-01-24 DIAGNOSIS — R0609 Other forms of dyspnea: Secondary | ICD-10-CM

## 2011-01-24 DIAGNOSIS — R42 Dizziness and giddiness: Secondary | ICD-10-CM

## 2011-01-24 DIAGNOSIS — R0989 Other specified symptoms and signs involving the circulatory and respiratory systems: Secondary | ICD-10-CM

## 2011-01-24 DIAGNOSIS — E785 Hyperlipidemia, unspecified: Secondary | ICD-10-CM | POA: Insufficient documentation

## 2011-03-05 ENCOUNTER — Ambulatory Visit: Payer: Medicare Other | Admitting: Cardiology

## 2011-03-09 ENCOUNTER — Encounter: Payer: Self-pay | Admitting: Cardiology

## 2011-03-09 ENCOUNTER — Ambulatory Visit (INDEPENDENT_AMBULATORY_CARE_PROVIDER_SITE_OTHER): Payer: Medicare Other | Admitting: Cardiology

## 2011-03-09 DIAGNOSIS — E785 Hyperlipidemia, unspecified: Secondary | ICD-10-CM

## 2011-03-09 DIAGNOSIS — R0600 Dyspnea, unspecified: Secondary | ICD-10-CM

## 2011-03-09 NOTE — Patient Instructions (Signed)
Your physician wants you to follow-up in: 6 months with Dr McLean.(March 2013). You will receive a reminder letter in the mail two months in advance. If you don't receive a letter, please call our office to schedule the follow-up appointment.  

## 2011-03-11 NOTE — Assessment & Plan Note (Signed)
Excellent lipids when checked last month.  Continue simvastatin.

## 2011-03-11 NOTE — Progress Notes (Signed)
PCP: Dr. Duanne Guess  70 yo with history of HTN and obesity was admitted to [facility] in 3/11 with atypical chest pain and palpitations. She ended up having a heart catheterization that showed nonobstructive CAD, but EF was decreased at 40%. She was also diagnosed with diabetes. Her palpitations seemed to correlate with PACs on telemetry. Echo in 9/11 showed recovery of LV systolic function (EF 55-60%).   At last appointment, patient reported increased exertional dyspnea.  Echo in 8/12 showed EF 50-55% with mild AI and mild MR.  She continues to have the same degree of exertional dyspnea as at the last appointment.  She is fatigued.  No chest pain.  She has not been able to exercise at all due to low back pain and knee pain.  She actually had a recent epidural injection.  She eats a lot of snacks and eats her largest meal at night.  She has gained another 12 lbs.   Labs (3/11): K 4.3, creatinine 0.69, BNP 41, LDL 82, HDL 49  Labs (4/11): K 4.3, creatinine 0.7, BNP 75  Labs (5/11): LDL 53, HDL 52, LFTs normal  Labs (6/11): creatinine 0.6, K 4.5  Labs (9/11): K 4.4, creatinine 0.7  Labs (11/11): LDL 71, HDL 60, TGs 128, LFTs normal  Labs (8/12): TSH normal, BNP 80, HCT 39.3, LDL 64, HDL 66, K 4.4, creatinine 0.6  Allergies:  1) ! Iodine  2) ! Penicillin   Past Medical History:  1. Frozen shoulder  2. Obesity  3. HTN: ACEI cough.  4. Diabetes  5. Hyperlipidemia  6. Palpitations: PACs noted on telemetry while in the hospital  7. Nonischemic cardiomyopathy: Echo (3/11): Difficult study, moderate aortic insufficiency noted. Left ventriculogram (3/11): EF 40%, global hypokinesis. Echo (9/11): EF 55-60%, moderate diastolic dysfunction, mild aortic insufficiency.  Echo (8/12): EF 50-55%, mild AI, mild MR 8. Osteoarthritis  9. CAD (nonobstructive): Left heart cath (3/11): 40% ostial D1, 30% mid CFX.  10. Aortic insufficiency: Mild on 9/11 cath.   Family History:  Mother: CVA at 58  Siblings: sister  has diabetes and heart disease.  Siblings: sister who is deceased in her 81s of an MI.  Siblings: brother with diabetes  Father: deceased at the age of 68 with an MI.   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  Tobacco Use - No, quit 1970s.  Alcohol Use - yes  Drug Use - no   Current Outpatient Prescriptions  Medication Sig Dispense Refill  . aspirin 81 MG tablet Take 81 mg by mouth daily.        . calcium carbonate (OS-CAL) 600 MG TABS Take 600 mg by mouth daily.        . carvedilol (COREG) 6.25 MG tablet Take 6.25 mg by mouth 2 (two) times daily.        . Cholecalciferol (VITAMIN D) 1000 UNITS capsule Take 1,000 Units by mouth daily.        . furosemide (LASIX) 20 MG tablet Take 20 mg by mouth daily.        Marland Kitchen loratadine (CLARITIN) 10 MG tablet Take 10 mg by mouth daily.        Marland Kitchen losartan (COZAAR) 25 MG tablet Take 12.5 mg by mouth daily.        . meclizine (ANTIVERT) 25 MG tablet Take one every 8 hours as needed for dizziness  30 tablet  0  . potassium chloride SA (KLOR-CON M20) 20 MEQ tablet Take 10 mEq by  mouth daily.        . sertraline (ZOLOFT) 100 MG tablet Take 100 mg by mouth daily.        . simvastatin (ZOCOR) 40 MG tablet Take 40 mg by mouth at bedtime.          BP 126/73  Pulse 67  Ht 5' (1.524 m)  Wt 252 lb (114.306 kg)  BMI 49.22 kg/m2 General: NAD, obese.  Neck: JVP not elevated, no thyromegaly or thyroid nodule.  Lungs: Clear to auscultation bilaterally with normal respiratory effort. CV: Nondisplaced PMI.  Heart regular S1/S2, no S3/S4, no murmur.  No peripheral edema.  No carotid bruit.  Normal pedal pulses.  Abdomen: Soft, nontender, no hepatosplenomegaly, no distention.  Neurologic: Alert and oriented x 3.  Psych: Normal affect. Extremities: No clubbing or cyanosis.

## 2011-03-11 NOTE — Assessment & Plan Note (Signed)
EF preserved on echo.  Not volume overloaded on exam and BNP not significantly increased.  Weight is up another 12 lbs.  I think that the main cause of her dyspnea is obesity and deconditioning.  She is sedentary due to orthopedic pain, and she has a very poor diet.  We talked at length about her need to change her diet and cut back significantly on portions and snacks.  Bariatric surgery may be a reasonable option for her.  Given fatigue and body habitus, OSA would be a consideration.

## 2011-04-17 ENCOUNTER — Other Ambulatory Visit: Payer: Self-pay | Admitting: Family Medicine

## 2011-04-17 DIAGNOSIS — Z1231 Encounter for screening mammogram for malignant neoplasm of breast: Secondary | ICD-10-CM

## 2011-05-10 ENCOUNTER — Ambulatory Visit
Admission: RE | Admit: 2011-05-10 | Discharge: 2011-05-10 | Disposition: A | Discharge status: Home / Self Care | Payer: Medicare Other | Source: Ambulatory Visit | Attending: Family Medicine | Admitting: Family Medicine

## 2011-05-10 DIAGNOSIS — Z1231 Encounter for screening mammogram for malignant neoplasm of breast: Secondary | ICD-10-CM

## 2011-06-25 ENCOUNTER — Other Ambulatory Visit: Payer: Self-pay | Admitting: Family Medicine

## 2011-06-25 DIAGNOSIS — Z780 Asymptomatic menopausal state: Secondary | ICD-10-CM

## 2011-07-04 ENCOUNTER — Other Ambulatory Visit: Payer: Medicare Other

## 2011-07-10 ENCOUNTER — Ambulatory Visit
Admission: RE | Admit: 2011-07-10 | Discharge: 2011-07-10 | Disposition: A | Discharge status: Home / Self Care | Payer: Medicare Other | Source: Ambulatory Visit | Attending: Family Medicine | Admitting: Family Medicine

## 2011-07-10 DIAGNOSIS — Z780 Asymptomatic menopausal state: Secondary | ICD-10-CM

## 2011-09-06 ENCOUNTER — Encounter: Payer: Self-pay | Admitting: Cardiology

## 2011-09-06 ENCOUNTER — Ambulatory Visit (INDEPENDENT_AMBULATORY_CARE_PROVIDER_SITE_OTHER): Payer: Medicare Other | Admitting: Cardiology

## 2011-09-06 VITALS — BP 140/78 | HR 62 | Ht 60.0 in | Wt 249.8 lb

## 2011-09-06 DIAGNOSIS — I5032 Chronic diastolic (congestive) heart failure: Secondary | ICD-10-CM

## 2011-09-06 DIAGNOSIS — I509 Heart failure, unspecified: Secondary | ICD-10-CM

## 2011-09-06 DIAGNOSIS — E785 Hyperlipidemia, unspecified: Secondary | ICD-10-CM

## 2011-09-06 DIAGNOSIS — R0600 Dyspnea, unspecified: Secondary | ICD-10-CM

## 2011-09-06 DIAGNOSIS — I10 Essential (primary) hypertension: Secondary | ICD-10-CM

## 2011-09-06 MED ORDER — TRAMADOL HCL 50 MG PO TABS: 50.0000 mg | ORAL_TABLET | Freq: Two times a day (BID) | ORAL | Status: AC | PRN

## 2011-09-06 NOTE — Patient Instructions (Addendum)
Your physician recommends that you have  lab work today-BMET/lipid profile 428.32  Avoid ibuprofen.   Use tramadol 50mg  twice a day as needed for pain.  Your physician wants you to follow-up in: 6 months with Dr Shirlee Latch. (September 2013). You will receive a reminder letter in the mail two months in advance. If you don't receive a letter, please call our office to schedule the follow-up appointment.

## 2011-09-09 NOTE — Assessment & Plan Note (Signed)
Check lipids on simvastatin.

## 2011-09-09 NOTE — Assessment & Plan Note (Addendum)
BP is mildly elevated but she just started on losartan.  Continue current doses of losartan and Coreg for now.  With hypertension and a tendency towards volume retention, I would like her to avoid ibuprofen for back pain and will give her a prescription for tramadol to use instead.

## 2011-09-09 NOTE — Assessment & Plan Note (Signed)
EF preserved on echo.  Not volume overloaded on exam and BNP not significantly increased.  I think that the main cause of her dyspnea is obesity and deconditioning.  She is sedentary due to orthopedic pain, and she has a very poor diet.  We talked again at length about her need to change her diet and cut back significantly on portions and snacks.  Bariatric surgery may be a reasonable option for her.  She has lost 3 lbs since last appointment.

## 2011-09-09 NOTE — Progress Notes (Signed)
PCP: Dr. Duanne Guess  71 yo with history of HTN and obesity was admitted to [facility] in 3/11 with atypical chest pain and palpitations. She ended up having a heart catheterization that showed nonobstructive CAD, but EF was decreased at 40%. She was also diagnosed with diabetes. Her palpitations seemed to correlate with PACs on telemetry. Echo in 9/11 showed recovery of LV systolic function (EF 55-60%).  Repeat echo in 8/12 showed EF 50-55% with mild AI and mild MR.    She has stable exertional dyspnea, noting shortness of breath with steps. She tends to do ok on flat ground but has not been walking much due to chronic back pain.  She tries to walk some around her sister's apartment complex.  She has chronic mild atypical chest pain from time to time. Weight is down 3 lbs since last appointment.    ECG: NSR, leftwards axis, LVH, nonspecific T wave flattening.   Labs (3/11): K 4.3, creatinine 0.69, BNP 41, LDL 82, HDL 49  Labs (4/11): K 4.3, creatinine 0.7, BNP 75  Labs (5/11): LDL 53, HDL 52, LFTs normal  Labs (6/11): creatinine 0.6, K 4.5  Labs (9/11): K 4.4, creatinine 0.7  Labs (11/11): LDL 71, HDL 60, TGs 128, LFTs normal  Labs (8/12): TSH normal, BNP 80, HCT 39.3, LDL 64, HDL 66, K 4.4, creatinine 0.6  Allergies:  1) ! Iodine  2) ! Penicillin   Past Medical History:  1. Frozen shoulder  2. Obesity  3. HTN: ACEI cough.  4. Diabetes  5. Hyperlipidemia  6. Palpitations: PACs noted on telemetry while in the hospital  7. Nonischemic cardiomyopathy: Echo (3/11): Difficult study, moderate aortic insufficiency noted. Left ventriculogram (3/11): EF 40%, global hypokinesis. Echo (9/11): EF 55-60%, moderate diastolic dysfunction, mild aortic insufficiency.  Echo (8/12): EF 50-55%, mild AI, mild MR 8. Osteoarthritis  9. CAD (nonobstructive): Left heart cath (3/11): 40% ostial D1, 30% mid CFX.  10. Aortic insufficiency: Mild on 9/11 cath.   Family History:  Mother: CVA at 35  Siblings: sister  has diabetes and heart disease.  Siblings: sister who is deceased in her 54s of an MI.  Siblings: brother with diabetes  Father: deceased at the age of 85 with an MI.   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  Tobacco Use - No, quit 1970s.  Alcohol Use - yes  Drug Use - no   Current Outpatient Prescriptions  Medication Sig Dispense Refill  . alendronate (FOSAMAX) 70 MG tablet Take 70 mg by mouth every 7 (seven) days. Take with a full glass of water on an empty stomach.      Marland Kitchen aspirin 81 MG tablet Take 81 mg by mouth daily.        . calcium carbonate (OS-CAL) 600 MG TABS Take 600 mg by mouth 2 (two) times daily with a meal.       . carvedilol (COREG) 6.25 MG tablet Take 6.25 mg by mouth 2 (two) times daily.        . Cholecalciferol (VITAMIN D) 1000 UNITS capsule Take 1,000 Units by mouth daily.        . furosemide (LASIX) 20 MG tablet Take 20 mg by mouth daily.        Marland Kitchen loratadine (CLARITIN) 10 MG tablet Take 10 mg by mouth daily.        Marland Kitchen losartan (COZAAR) 25 MG tablet Take 25 mg by mouth daily.       . meclizine (ANTIVERT)  25 MG tablet Take one every 8 hours as needed for dizziness  30 tablet  0  . potassium chloride SA (KLOR-CON M20) 20 MEQ tablet Take 20 mEq by mouth daily.       . simvastatin (ZOCOR) 40 MG tablet Take 40 mg by mouth at bedtime.        . traMADol (ULTRAM) 50 MG tablet Take 1 tablet (50 mg total) by mouth 2 (two) times daily as needed for pain.  20 tablet  1    BP 140/78  Pulse 62  Ht 5' (1.524 m)  Wt 249 lb 12.8 oz (113.309 kg)  BMI 48.79 kg/m2 General: NAD, obese.  Neck: JVP not elevated, no thyromegaly or thyroid nodule.  Lungs: Clear to auscultation bilaterally with normal respiratory effort. CV: Nondisplaced PMI.  Heart regular S1/S2, no S3/S4, no murmur.  Trace ankle edema.  No carotid bruit.  Normal pedal pulses.  Abdomen: Soft, nontender, no hepatosplenomegaly, no distention.  Neurologic: Alert and oriented x 3.    Psych: Normal affect. Extremities: No clubbing or cyanosis.

## 2011-09-13 ENCOUNTER — Other Ambulatory Visit: Payer: Medicare Other

## 2011-09-14 ENCOUNTER — Other Ambulatory Visit (INDEPENDENT_AMBULATORY_CARE_PROVIDER_SITE_OTHER): Payer: Medicare Other

## 2011-09-14 DIAGNOSIS — I509 Heart failure, unspecified: Secondary | ICD-10-CM

## 2011-09-14 DIAGNOSIS — I5032 Chronic diastolic (congestive) heart failure: Secondary | ICD-10-CM

## 2011-09-14 LAB — BASIC METABOLIC PANEL
BUN: 19 mg/dL (ref 6–23)
Calcium: 9.5 mg/dL (ref 8.4–10.5)
Creatinine, Ser: 0.7 mg/dL (ref 0.4–1.2)
GFR: 111.72 mL/min (ref >60.00)
Glucose, Bld: 106 mg/dL — ABNORMAL HIGH (ref 70–99)

## 2011-09-14 LAB — LIPID PANEL: Cholesterol: 127 mg/dL (ref 0–200)

## 2011-09-26 DIAGNOSIS — J309 Allergic rhinitis, unspecified: Secondary | ICD-10-CM | POA: Insufficient documentation

## 2012-03-25 ENCOUNTER — Encounter: Payer: Self-pay | Admitting: Cardiology

## 2012-03-25 ENCOUNTER — Ambulatory Visit (INDEPENDENT_AMBULATORY_CARE_PROVIDER_SITE_OTHER): Payer: Medicare Other | Admitting: Cardiology

## 2012-03-25 VITALS — BP 125/50 | HR 58 | Ht 60.0 in | Wt 253.0 lb

## 2012-03-25 DIAGNOSIS — E785 Hyperlipidemia, unspecified: Secondary | ICD-10-CM

## 2012-03-25 DIAGNOSIS — R0600 Dyspnea, unspecified: Secondary | ICD-10-CM

## 2012-03-25 DIAGNOSIS — R0989 Other specified symptoms and signs involving the circulatory and respiratory systems: Secondary | ICD-10-CM

## 2012-03-25 DIAGNOSIS — R0609 Other forms of dyspnea: Secondary | ICD-10-CM

## 2012-03-25 DIAGNOSIS — I10 Essential (primary) hypertension: Secondary | ICD-10-CM

## 2012-03-25 DIAGNOSIS — R079 Chest pain, unspecified: Secondary | ICD-10-CM

## 2012-03-25 LAB — LIPID PANEL
Cholesterol: 136 mg/dL (ref 0–200)
LDL Cholesterol: 56 mg/dL (ref 0–99)
VLDL: 24.8 mg/dL (ref 0.0–40.0)

## 2012-03-25 LAB — CBC WITH DIFFERENTIAL/PLATELET
Eosinophils Relative: 3.6 % (ref 0.0–5.0)
HCT: 39.2 % (ref 36.0–46.0)
Hemoglobin: 12.8 g/dL (ref 12.0–15.0)
Lymphs Abs: 1.4 10*3/uL (ref 0.7–4.0)
MCV: 93.4 fl (ref 78.0–100.0)
Monocytes Absolute: 0.4 10*3/uL (ref 0.1–1.0)
Monocytes Relative: 6.2 % (ref 3.0–12.0)
Neutro Abs: 4.9 10*3/uL (ref 1.4–7.7)
Platelets: 233 10*3/uL (ref 150.0–400.0)
RDW: 13.7 % (ref 11.5–14.6)
WBC: 7 10*3/uL (ref 4.5–10.5)

## 2012-03-25 LAB — BRAIN NATRIURETIC PEPTIDE: Pro B Natriuretic peptide (BNP): 53 pg/mL (ref 0.0–100.0)

## 2012-03-25 NOTE — Patient Instructions (Addendum)
Your physician recommends that you have  a FASTING lipid profile TSH/CBC/ BNP today.  Your physician has requested that you have en exercise stress myoview. For further information please visit [email or web address] Please follow instruction sheet, as given.  Call [phone] to get more information about Bariatric Surgery.   Your physician wants you to follow-up in: 6 months with Dr Shirlee Latch. (April 2014) You will receive a reminder letter in the mail two months in advance. If you don't receive a letter, please call our office to schedule the follow-up appointment.

## 2012-03-26 ENCOUNTER — Ambulatory Visit (HOSPITAL_COMMUNITY): Payer: Medicare Other | Attending: Cardiovascular Disease | Admitting: Radiology

## 2012-03-26 VITALS — BP 126/59 | Ht 60.0 in | Wt 249.0 lb

## 2012-03-26 DIAGNOSIS — R079 Chest pain, unspecified: Secondary | ICD-10-CM

## 2012-03-26 DIAGNOSIS — I10 Essential (primary) hypertension: Secondary | ICD-10-CM | POA: Insufficient documentation

## 2012-03-26 DIAGNOSIS — E119 Type 2 diabetes mellitus without complications: Secondary | ICD-10-CM | POA: Insufficient documentation

## 2012-03-26 DIAGNOSIS — I2510 Atherosclerotic heart disease of native coronary artery without angina pectoris: Secondary | ICD-10-CM

## 2012-03-26 DIAGNOSIS — R0602 Shortness of breath: Secondary | ICD-10-CM

## 2012-03-26 DIAGNOSIS — Z8249 Family history of ischemic heart disease and other diseases of the circulatory system: Secondary | ICD-10-CM | POA: Insufficient documentation

## 2012-03-26 DIAGNOSIS — R0609 Other forms of dyspnea: Secondary | ICD-10-CM | POA: Insufficient documentation

## 2012-03-26 DIAGNOSIS — R0989 Other specified symptoms and signs involving the circulatory and respiratory systems: Secondary | ICD-10-CM | POA: Insufficient documentation

## 2012-03-26 DIAGNOSIS — R42 Dizziness and giddiness: Secondary | ICD-10-CM | POA: Insufficient documentation

## 2012-03-26 MED ORDER — TECHNETIUM TC 99M SESTAMIBI GENERIC - CARDIOLITE
30.0000 | Freq: Once | INTRAVENOUS | Status: AC | PRN
  Administered 2012-03-26: 30 via INTRAVENOUS

## 2012-03-26 NOTE — Progress Notes (Signed)
Patient ID: Julie Graves, female   DOB: 1940-11-22, 71 y.o.   MRN: [id number] PCP: Dr. Duanne Guess  71 yo with history of HTN and obesity was admitted to [facility] in 3/11 with atypical chest pain and palpitations. She ended up having a heart catheterization that showed nonobstructive CAD, but EF was decreased at 40%. She was also diagnosed with diabetes. Her palpitations seemed to correlate with PACs on telemetry. Echo in 9/11 showed recovery of LV systolic function (EF 55-60%).  Repeat echo in 8/12 showed EF 50-55% with mild AI and mild MR.    She has continued to gain weight, up another 4 lbs.  BMI now 49.  She still snacks a lot, and her overall her diet is poor.  She says that she has been more short of breath recently and is concerned for a "heart blockage."  She was profoundly short of breath walking [place] a [place] this [place] in the [place].  She is getting more short of breath walking up steps.  She is fatigued with no energy all the time.  No exertional chest pain but she has "little sharp pains" in her chest lasting < 30 seconds at a time.  These seem to come on at random.     ECG: NSR, LVH, LAFB  Labs (3/11): K 4.3, creatinine 0.69, BNP 41, LDL 82, HDL 49  Labs (4/11): K 4.3, creatinine 0.7, BNP 75  Labs (5/11): LDL 53, HDL 52, LFTs normal  Labs (6/11): creatinine 0.6, K 4.5  Labs (9/11): K 4.4, creatinine 0.7  Labs (11/11): LDL 71, HDL 60, TGs 128, LFTs normal  Labs (8/12): TSH normal, BNP 80, HCT 39.3, LDL 64, HDL 66, K 4.4, creatinine 0.6 Labs (3/13): K 4.1, creatinine 0.7, LDL 47, HDL 56  Allergies:  1) ! Iodine  2) ! Penicillin   Past Medical History:  1. Frozen shoulder  2. Obesity  3. HTN: ACEI cough.  4. Diabetes  5. Hyperlipidemia  6. Palpitations: PACs noted on telemetry while in the hospital  7. Nonischemic cardiomyopathy: Echo (3/11): Difficult study, moderate aortic insufficiency noted. Left ventriculogram (3/11): EF 40%, global hypokinesis. Echo (9/11): EF 55-60%,  moderate diastolic dysfunction, mild aortic insufficiency.  Echo (8/12): EF 50-55%, mild AI, mild MR 8. Osteoarthritis  9. CAD (nonobstructive): Left heart cath (3/11): 40% ostial D1, 30% mid CFX.  10. Aortic insufficiency: Mild on 9/11 cath.   Family History:  Mother: CVA at 77  Siblings: sister has diabetes and heart disease.  Siblings: sister who is deceased in her 58s of an MI.  Siblings: brother with diabetes  Father: deceased at the age of 31 with an MI.   Social History:  Retired Administrator, sports, lives in [place]. 3 children.  Tobacco Use - No, quit 1970s.  Alcohol Use - yes  Drug Use - no  ROS: All systems reviewed and negative except as per HPI.    Current Outpatient Prescriptions  Medication Sig Dispense Refill  . alendronate (FOSAMAX) 70 MG tablet Take 70 mg by mouth every 7 (seven) days. Take with a full glass of water on an empty stomach.      Marland Kitchen aspirin 81 MG tablet Take 81 mg by mouth daily.        . calcium carbonate (OS-CAL) 600 MG TABS Take 600 mg by mouth 2 (two) times daily with a meal.       . carvedilol (COREG) 6.25 MG tablet Take 6.25 mg by mouth 2 (two) times daily.        Marland Kitchen  Cholecalciferol (VITAMIN D) 1000 UNITS capsule Take 1,000 Units by mouth daily.        . furosemide (LASIX) 20 MG tablet Take 20 mg by mouth daily.        Marland Kitchen loratadine (CLARITIN) 10 MG tablet Take 10 mg by mouth daily.        Marland Kitchen losartan (COZAAR) 25 MG tablet Take 25 mg by mouth daily.       . meclizine (ANTIVERT) 25 MG tablet Take one every 8 hours as needed for dizziness  30 tablet  0  . potassium chloride SA (KLOR-CON M20) 20 MEQ tablet Take 20 mEq by mouth daily.       . simvastatin (ZOCOR) 40 MG tablet Take 40 mg by mouth at bedtime.        . traMADol (ULTRAM) 50 MG tablet Take 1 tablet (50 mg total) by mouth 2 (two) times daily as needed for pain.  20 tablet  1  . DISCONTD: sertraline (ZOLOFT) 100 MG tablet Take 100 mg by mouth daily.          BP 125/50  Pulse 58  Ht 5' (1.524  m)  Wt 253 lb (114.76 kg)  BMI 49.41 kg/m2 General: NAD, obese.  Neck: JVP not elevated, no thyromegaly or thyroid nodule.  Lungs: Clear to auscultation bilaterally with normal respiratory effort. CV: Nondisplaced PMI.  Heart regular S1/S2, no S3/S4, no murmur.  Trace ankle edema.  No carotid bruit.  Normal pedal pulses.  Abdomen: Soft, nontender, no hepatosplenomegaly, no distention.  Neurologic: Alert and oriented x 3.  Psych: Normal affect. Extremities: No clubbing or cyanosis.   Assessment/Plan: HYPERLIPIDEMIA-MIXED  Check lipids on simvastatin. HYPERTENSION, UNSPECIFIED  BP seems to be under reasonable control on current regimen.  Dyspnea  Mrs Chelette feels like her dyspnea is considerably worse than in the past.  She is concerned that she has a "heart blockage."  Exam is difficult for volume, but she does not appear particularly volume overloaded.  She has atypical chest pain and had nonobstructive CAD on a prior cath. She is morbidly obese and has been gaining weight.  I suspect that the dyspnea is due to obesity/deconditioning.  I will have her do a stress myoview to make sure that there is no significant ischemia.   Obesity BMI now 49.  Weight loss is imperative.  Her diet is still poor.  We discussed dietary improvements.  If myoview is normal, I want her to start exercising (walking).  I will give her the number for the bariatric program at [facility].   Iyanla Eilers [employer]

## 2012-03-26 NOTE — Progress Notes (Addendum)
[facility] SITE 3 NUCLEAR MED [id number] [place] [address] [phone]  Cardiology Nuclear Med Study  Julie Graves is a 71 y.o. female     MRN : [id number]     DOB: February 03, 1941  Procedure Date: 03/26/2012  Nuclear Med Background Indication for Stress Test:  Evaluation for Ischemia History:  NICM, 3/11 Heart Cath: EF: 40%, 01/24/11 ECHO: EF: 50-55% Cardiac Risk Factors: Family History - CAD, History of Smoking, Hypertension, Lipids and NIDDM  Symptoms:  Chest Pain, Dizziness, DOE and SOB   Nuclear Pre-Procedure Caffeine/Decaff Intake:  None > 12 hrs NPO After: 6:00pm   Lungs:  clear O2 Sat: 95% on room air. IV 0.9% NS with Angio Cath:  22g  IV Site: R Antecubital x 1, tolerated well IV Started by:  Irean Hong, RN  Chest Size (in):  46 Cup Size: DD  Height: 5' (1.524 m)  Weight:  249 lb (112.946 kg)  BMI:  Body mass index is 48.63 kg/(m^2). Tech Comments:  Held Coreg x 24 hrs    Nuclear Med Study 1 or 2 day study: 2 day  Stress Test Type:  Stress  Reading MD: Kristeen Miss, MD  Order Authorizing Provider:  Marca Ancona, MD  Resting Radionuclide: Technetium 23m Sestamibi  Resting Radionuclide Dose: 33.0 mCi on 03/27/12   Stress Radionuclide:  Technetium 29m Sestamibi  Stress Radionuclide Dose: 33.0 mCi on 03/26/12           Stress Protocol Rest HR: 62 Stress HR: 133  Rest BP: 126/59 Stress BP: 156/89  Exercise Time (min): 2:59 METS: 4.6   Predicted Max HR: 149 bpm % Max HR: 89.26 bpm Rate Pressure Product: 62952   Dose of Adenosine (mg):  n/a Dose of Lexiscan: n/a mg  Dose of Atropine (mg): n/a Dose of Dobutamine: n/a mcg/kg/min (at max HR)  Stress Test Technologist: Milana Na, EMT-P  Nuclear Technologist:  Domenic Polite, CNMT     Rest Procedure:  Myocardial perfusion imaging was performed at rest 45 minutes following the intravenous administration of Technetium 27m Sestamibi. Rest ECG: NSR - Normal  EKG  Stress Procedure:  The patient performed treadmill exercise using a Bruce  Protocol for 2:59 minutes. The patient stopped due to fatigue, very sob, and denied any chest pain.  There were non specific ST-T wave changes and rare pvcs.  Technetium 46m Sestamibi was injected at peak exercise and myocardial perfusion imaging was performed after a brief delay. Stress ECG: No significant change from baseline ECG  QPS Raw Data Images:  Normal; no motion artifact; normal heart/lung ratio. Stress Images:  Small, mild apical perfusion defect.  Rest Images:  Normal homogeneous uptake in all areas of the myocardium. Subtraction (SDS):  Reversible small, mild apical perfusion defect.  Transient Ischemic Dilatation (Normal <1.22):  0.99 Lung/Heart Ratio (Normal <0.45):  0.43  Quantitative Gated Spect Images QGS EDV:  135 ml QGS ESV:  66 ml  Impression Exercise Capacity:  Poor exercise capacity. BP Response:  Hypertensive blood pressure response. Clinical Symptoms:  Very short of breath.  ECG Impression:  No significant ST segment change suggestive of ischemia. Comparison with Prior Nuclear Study: No images to compare  Overall Impression:  Low risk stress nuclear study. Poor exercise tolerance.  There was a small, mild reversible apical perfusion defect.  Cannot rule out a small area of ischemia but given prominent breast shadowing on planar images, this may represent shifting breast artifact.   LV Ejection Fraction: 51%.  LV Wall Motion:  Mild global hypokinesis.  This is comparable to prior echoes.   Marca Ancona 03/27/2012  Low risk study.  I do not think this is suggestive of a significant blockage.  EF consistent with the past.  Please tell patient.   Marca Ancona 03/28/2012 11:26 AM

## 2012-03-27 ENCOUNTER — Ambulatory Visit (HOSPITAL_COMMUNITY): Payer: Medicare Other | Attending: Cardiology

## 2012-03-27 ENCOUNTER — Telehealth: Payer: Self-pay | Admitting: Cardiology

## 2012-03-27 DIAGNOSIS — R0989 Other specified symptoms and signs involving the circulatory and respiratory systems: Secondary | ICD-10-CM

## 2012-03-27 MED ORDER — TECHNETIUM TC 99M SESTAMIBI GENERIC - CARDIOLITE
33.0000 | Freq: Once | INTRAVENOUS | Status: AC | PRN
  Administered 2012-03-27: 33 via INTRAVENOUS

## 2012-03-27 NOTE — Telephone Encounter (Signed)
New problem:  Test results.  

## 2012-03-27 NOTE — Telephone Encounter (Signed)
**Note De-Identified Julie Graves Obfuscation** Pt. given recent lab results, she verbalized understanding./LV

## 2012-03-28 NOTE — Patient Instructions (Signed)
Pt given results of stress test

## 2012-03-31 NOTE — Progress Notes (Signed)
Pt advised,verbalized understanding. 

## 2012-04-17 ENCOUNTER — Other Ambulatory Visit: Payer: Self-pay | Admitting: Family Medicine

## 2012-04-17 DIAGNOSIS — Z1231 Encounter for screening mammogram for malignant neoplasm of breast: Secondary | ICD-10-CM

## 2012-05-19 ENCOUNTER — Ambulatory Visit
Admission: RE | Admit: 2012-05-19 | Discharge: 2012-05-19 | Disposition: A | Discharge status: Home / Self Care | Payer: Medicare Other | Source: Ambulatory Visit | Attending: Family Medicine | Admitting: Family Medicine

## 2012-05-19 DIAGNOSIS — Z1231 Encounter for screening mammogram for malignant neoplasm of breast: Secondary | ICD-10-CM

## 2012-05-27 DIAGNOSIS — E785 Hyperlipidemia, unspecified: Secondary | ICD-10-CM | POA: Insufficient documentation

## 2012-10-01 DIAGNOSIS — M48061 Spinal stenosis, lumbar region without neurogenic claudication: Secondary | ICD-10-CM | POA: Insufficient documentation

## 2012-10-02 ENCOUNTER — Encounter: Payer: Self-pay | Admitting: Physical Medicine & Rehabilitation

## 2012-10-27 ENCOUNTER — Encounter: Payer: Medicare Other | Attending: Physical Medicine & Rehabilitation

## 2012-10-27 ENCOUNTER — Encounter: Payer: Self-pay | Admitting: Physical Medicine & Rehabilitation

## 2012-10-27 ENCOUNTER — Ambulatory Visit (HOSPITAL_BASED_OUTPATIENT_CLINIC_OR_DEPARTMENT_OTHER): Payer: Medicare Other | Admitting: Physical Medicine & Rehabilitation

## 2012-10-27 VITALS — BP 149/67 | HR 74 | Resp 16 | Ht 60.0 in | Wt 257.0 lb

## 2012-10-27 DIAGNOSIS — M25559 Pain in unspecified hip: Secondary | ICD-10-CM | POA: Insufficient documentation

## 2012-10-27 DIAGNOSIS — I509 Heart failure, unspecified: Secondary | ICD-10-CM | POA: Insufficient documentation

## 2012-10-27 DIAGNOSIS — M1990 Unspecified osteoarthritis, unspecified site: Secondary | ICD-10-CM | POA: Insufficient documentation

## 2012-10-27 DIAGNOSIS — E119 Type 2 diabetes mellitus without complications: Secondary | ICD-10-CM | POA: Insufficient documentation

## 2012-10-27 DIAGNOSIS — M7062 Trochanteric bursitis, left hip: Secondary | ICD-10-CM

## 2012-10-27 DIAGNOSIS — Z79899 Other long term (current) drug therapy: Secondary | ICD-10-CM

## 2012-10-27 DIAGNOSIS — M79609 Pain in unspecified limb: Secondary | ICD-10-CM | POA: Insufficient documentation

## 2012-10-27 DIAGNOSIS — I10 Essential (primary) hypertension: Secondary | ICD-10-CM | POA: Insufficient documentation

## 2012-10-27 DIAGNOSIS — Z5181 Encounter for therapeutic drug level monitoring: Secondary | ICD-10-CM

## 2012-10-27 DIAGNOSIS — M48061 Spinal stenosis, lumbar region without neurogenic claudication: Secondary | ICD-10-CM | POA: Insufficient documentation

## 2012-10-27 DIAGNOSIS — I2510 Atherosclerotic heart disease of native coronary artery without angina pectoris: Secondary | ICD-10-CM | POA: Insufficient documentation

## 2012-10-27 DIAGNOSIS — M76899 Other specified enthesopathies of unspecified lower limb, excluding foot: Secondary | ICD-10-CM

## 2012-10-27 DIAGNOSIS — M549 Dorsalgia, unspecified: Secondary | ICD-10-CM | POA: Insufficient documentation

## 2012-10-27 NOTE — Addendum Note (Signed)
Addended by: Doreene Eland on: 10/27/2012 01:42 PM   Modules accepted: Orders

## 2012-10-27 NOTE — Patient Instructions (Addendum)
If urine drug screen looks okay we can call in Tylenol 3 one tablet 2 times per day Do the exercises below Trochanteric Bursitis You have hip pain due to trochanteric bursitis. Bursitis means that the sack near the outside of the hip is filled with fluid and inflamed. This sack is made up of protective soft tissue. The pain from trochanteric bursitis can be severe and keep you from sleep. It can radiate to the buttocks or down the outside of the thigh to the knee. The pain is almost always worse when rising from the seated or lying position and with walking. Pain can improve after you take a few steps. It happens more often in people with hip joint and lumbar spine problems, such as arthritis or previous surgery. Very rarely the trochanteric bursa can become infected, and antibiotics and/or surgery may be needed. Treatment often includes an injection of local anesthetic mixed with cortisone medicine. This medicine is injected into the area where it is most tender over the hip. Repeat injections may be necessary if the response to treatment is slow. You can apply ice packs over the tender area for 30 minutes every 2 hours for the next few days. Anti-inflammatory and/or narcotic pain medicine may also be helpful. Limit your activity for the next few days if the pain continues. See your caregiver in 5-10 days if you are not greatly improved.  SEEK IMMEDIATE MEDICAL CARE IF:  You develop severe pain, fever, or increased redness.  You have pain that radiates below the knee. EXERCISES STRETCHING EXERCISES - Trochantic Bursitis  These exercises may help you when beginning to rehabilitate your injury. Your symptoms may resolve with or without further involvement from your physician, physical therapist or athletic trainer. While completing these exercises, remember:   Restoring tissue flexibility helps normal motion to return to the joints. This allows healthier, less painful movement and activity.  An  effective stretch should be held for at least 30 seconds.  A stretch should never be painful. You should only feel a gentle lengthening or release in the stretched tissue. STRETCH  Iliotibial Band  On the floor or bed, lie on your side so your injured leg is on top. Bend your knee and grab your ankle.  Slowly bring your knee back so that your thigh is in line with your trunk. Keep your heel at your buttocks and gently arch your back so your head, shoulders and hips line up.  Slowly lower your leg so that your knee approaches the floor/bed until you feel a gentle stretch on the outside of your thigh. If you do not feel a stretch and your knee will not fall farther, place the heel of your opposite foot on top of your knee and pull your thigh down farther.  Hold this stretch for __________ seconds.  Repeat __________ times. Complete this exercise __________ times per day. STRETCH Hamstrings, Supine   Lie on your back. Loop a belt or towel over the ball of your foot as shown.  Straighten your knee and slowly pull on the belt to raise your injured leg. Do not allow the knee to bend. Keep your opposite leg flat on the floor.  Raise the leg until you feel a gentle stretch behind your knee or thigh. Hold this position for __________ seconds.  Repeat __________ times. Complete this stretch __________ times per day. STRETCH - Quadriceps, Prone   Lie on your stomach on a firm surface, such as a bed or padded floor.  Bend your knee and grasp your ankle. If you are unable to reach, your ankle or pant leg, use a belt around your foot to lengthen your reach.  Gently pull your heel toward your buttocks. Your knee should not slide out to the side. You should feel a stretch in the front of your thigh and/or knee.  Hold this position for __________ seconds.  Repeat __________ times. Complete this stretch __________ times per day. STRETCHING - Hip Flexors, Lunge Half kneel with your knee on the floor  and your opposite knee bent and directly over your ankle.  Keep good posture with your head over your shoulders. Tighten your buttocks to point your tailbone downward; this will prevent your back from arching too much.  You should feel a gentle stretch in the front of your thigh and/or hip. If you do not feel any resistance, slightly slide your opposite foot forward and then slowly lunge forward so your knee once again lines up over your ankle. Be sure your tailbone remains pointed downward.  Hold this stretch for __________ seconds.  Repeat __________ times. Complete this stretch __________ times per day. STRETCH - Adductors, Lunge  While standing, spread your legs  Lean away from your injured leg by bending your opposite knee. You may rest your hands on your thigh for balance.  You should feel a stretch in your inner thigh. Hold for __________ seconds.  Repeat __________ times. Complete this exercise __________ times per day. Document Released: 07/19/2004 Document Revised: 09/03/2011 Document Reviewed: 09/23/2008 [facility] Patient Information 2013 [place], [place].

## 2012-10-27 NOTE — Progress Notes (Signed)
Subjective:    Patient ID: Julie Graves, female    DOB: [birth year], 72 y.o.   MRN: [id number]  HPI Female with congestive heart failure resident 2 year history of back pain radiating into the left hip lateral thigh lateral leg. She's been evaluated by orthopedics. She reports having had some type of spinal injections in the past. She's also had physical therapy in the past. She feels that these treatments were not helpful for her. She is also tried Lyrica which was causing fluid retention. In addition she tried tramadol but felt it made her breathing worse. Has used hydrocodone which makes her drowsy and falls sleep but then wakes up again with pain. Pain Inventory Average Pain 8 Pain Right Now n/a My pain is burning and aching  In the last 24 hours, has pain interfered with the following? General activity n/a Relation with others n/a Enjoyment of life n/a What TIME of day is your pain at its worst? morning,night time Sleep (in general) Fair  Pain is worse with: walking and standing Pain improves with: n/a Relief from Meds: n/a  Mobility use a cane  Function retired I need assistance with the following:  household duties Do you have any goals in this area?  no  Neuro/Psych bladder control problems  Prior Studies Any changes since last visit?  no RADIOLOGY REPORT*  Clinical Data: 72 year old female low back pain, burning and leg to  the ankle.  MRI LUMBAR SPINE WITHOUT CONTRAST  Technique: Multiplanar and multiecho pulse sequences of the lumbar  spine were obtained without intravenous contrast.  Comparison: 12/11/2003.  Findings: Visualized abdominal viscera and paraspinal soft tissues  are within normal limits. Visualized lower thoracic spinal cord  is normal with conus medularis at L1.  Stable vertebral height and alignment. No marrow edema or evidence  of acute osseous abnormality.  T11-T12: Interval disc desiccation and mild circumferential disc  bulge. No  significant stenosis.  T12-L1: Negative.  L1-L2: Negative.  L2-L3: Interval disc desiccation. Minor disc bulge. Mild facet  hypertrophy. No significant stenosis.  L3-L4: Interval loss of disc space height and continued disc  desiccation. Circumferential disc bulge has increased. Moderate  to severe facet and moderate ligament flavum hypertrophy is mildly  increased. Combined there is mild to moderate spinal stenosis (AP  thecal sac 7-8 mm) with mild left lateral recess and bilateral L3  foraminal stenosis.  L4-L5: Interval slight loss of disc height. Interval continued  disc desiccation. Moderate circumferential disc bulge has slightly  increased. Severe left and moderate right facet hypertrophy  appears stable. Ligament flavum hypertrophy on the left is mildly  increased. The spinal stenosis has mildly increased, now moderate  (AP thecal sac 6-7 mm), with moderate left lateral recess stenosis  and moderate left L4 foraminal stenosis.  L5-S1: Chronic sclerotic right and L5-S1 assimilation joint  appears stable. Stable mild left facet hypertrophy. Negative  disc. No stenosis.  IMPRESSION:  1. Mild progression of lumbar disc and facet degeneration since  2005.  2. Mild progression of multifactorial L3-L4 spinal stenosis (mild  to moderate), left lateral recess stenosis and bilateral L3  foraminal stenosis (all mild).  3. Mild progression of multifactorial L4-L5 spinal stenosis, left  lateral recess stenosis and left foraminal stenosis (all moderate).  4. 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  Physicians involved in your care Any changes since last visit?  no   Family History  Problem Relation Age of Onset  . Stroke Mother 56  . Heart attack  Father   . Diabetes Sister   . Heart disease Sister   . Diabetes Brother   . Heart attack Sister    History   Social History  . Marital Status: Married    Spouse Name: N/A    Number of Children: N/A  . Years of  Education: N/A   Social History Main Topics  . Smoking status: Former Games developer  . Smokeless tobacco: None     Comment: Quit 1970s  . Alcohol Use: Yes  . Drug Use: No  . Sexually Active:    Other Topics Concern  . None   Social History Narrative  . None   Past Surgical History  Procedure Laterality Date  . Abdominal hysterectomy    . Tubal ligation    . Eye surgery    . Appendectomy     Past Medical History  Diagnosis Date  . Frozen shoulder   . Obese   . HTN (hypertension)     ACEI cough  . Diabetes mellitus   . HLD (hyperlipidemia)   . Palpitation     PACs noted on telemetry while in the hospital  . Nonischemic cardiomyopathy     Ech 3/11 difficult study, moderate aortic insuficiency noted. Left evntriculogram 3/11  . Osteoarthritis   . CAD (coronary artery disease)     nonobstructive let heart cath 3/11: 40% ostial D1, 30% mid CFX  . Aortic insufficiency     mild on 9/11 cath  . CHF (congestive heart failure)    BP 149/67  Pulse 74  Resp 16  Ht 5' (1.524 m)  Wt 257 lb (116.574 kg)  BMI 50.19 kg/m2  SpO2 98%     Review of Systems  Genitourinary:       Bladder control problems.  All other systems reviewed and are negative.       Objective:   Physical Exam  Nursing note and vitals reviewed. Constitutional: She is oriented to person, place, and time. She appears well-developed.  Morbid obesity  HENT:  Head: Normocephalic.  Eyes: Conjunctivae and EOM are normal. Pupils are equal, round, and reactive to light.  Neck: Normal range of motion.  Musculoskeletal:       Lumbar back: She exhibits decreased range of motion and tenderness. She exhibits no spasm.  Left hip tenderness over the greater trochanter No pain with hip range of motion on either side No pain with knee range of motion on either side Lumbar range of motion mildly reduced In all directions 75% of normal Mild lumbar paraspinal tenderness  Neurological: She is alert and oriented to  person, place, and time. She has normal strength and normal reflexes. No sensory deficit. She exhibits normal muscle tone. Coordination abnormal.  Negative straight-leg raising Negative femoral stretch test  Psychiatric: She has a normal mood and affect.          Assessment & Plan:  1. Lumbar spinal stenosis and not convinced that she has neurogenic claudication. 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 Overall she would benefit from increasing activity. She used to walk a mile a day but has not done so after a flareup of her fasciitis. This problem has improved. Will inject trochanteric bursa today Discussed medication management she may do well with Tylenol 3 but need to check urine drug screen first RTC 1 month PA for exercise recommendations  Left hip trochanteric bursa injection Indication trochanteric bursitis not good candidate for nonsteroidals secondary  cardiac disease Informed consent was obtained at scarring risks and benefits of the procedure with the patient his include bleeding bruising and infection she elects to proceed and has given written consent. Patient placed in a right lateral decubitus position area marked and prepped with alcohol. 25-gauge 3.5" needle inserted to bone contact. After negative draw back for blood 1 cc of 40 mg mL Medrol and 4 cc 1% lidocaine injected. Patient tolerated procedure well. Post procedure instructions given

## 2012-11-03 ENCOUNTER — Telehealth: Payer: Self-pay

## 2012-11-03 NOTE — Telephone Encounter (Signed)
Patient has some questions.  Please call.

## 2012-11-04 NOTE — Telephone Encounter (Signed)
Inconsistent nonnarcotic only

## 2012-11-04 NOTE — Telephone Encounter (Signed)
Patient would like to know urine drug screen results.  She would like tylenol #3 called in if possible.

## 2012-11-07 NOTE — Telephone Encounter (Signed)
Informed patient she is non-narcotic.

## 2012-11-24 ENCOUNTER — Ambulatory Visit: Payer: Medicare Other | Admitting: Physical Medicine and Rehabilitation

## 2012-12-11 ENCOUNTER — Ambulatory Visit: Payer: Medicare Other | Admitting: Physical Medicine and Rehabilitation

## 2013-04-02 DIAGNOSIS — N3941 Urge incontinence: Secondary | ICD-10-CM | POA: Insufficient documentation

## 2013-05-06 ENCOUNTER — Other Ambulatory Visit: Payer: Self-pay

## 2013-05-06 DIAGNOSIS — Z1231 Encounter for screening mammogram for malignant neoplasm of breast: Secondary | ICD-10-CM

## 2013-05-17 ENCOUNTER — Emergency Department (HOSPITAL_COMMUNITY)
Admission: EM | Admit: 2013-05-17 | Discharge: 2013-05-17 | Disposition: A | Discharge status: Home / Self Care | Payer: Medicare Other | Attending: Emergency Medicine | Admitting: Emergency Medicine

## 2013-05-17 ENCOUNTER — Emergency Department (HOSPITAL_COMMUNITY): Payer: Medicare Other

## 2013-05-17 ENCOUNTER — Encounter (HOSPITAL_COMMUNITY): Payer: Self-pay | Admitting: Emergency Medicine

## 2013-05-17 DIAGNOSIS — E669 Obesity, unspecified: Secondary | ICD-10-CM | POA: Insufficient documentation

## 2013-05-17 DIAGNOSIS — Z79899 Other long term (current) drug therapy: Secondary | ICD-10-CM | POA: Insufficient documentation

## 2013-05-17 DIAGNOSIS — E785 Hyperlipidemia, unspecified: Secondary | ICD-10-CM | POA: Insufficient documentation

## 2013-05-17 DIAGNOSIS — Z87891 Personal history of nicotine dependence: Secondary | ICD-10-CM | POA: Insufficient documentation

## 2013-05-17 DIAGNOSIS — E119 Type 2 diabetes mellitus without complications: Secondary | ICD-10-CM | POA: Insufficient documentation

## 2013-05-17 DIAGNOSIS — Z7982 Long term (current) use of aspirin: Secondary | ICD-10-CM | POA: Insufficient documentation

## 2013-05-17 DIAGNOSIS — IMO0002 Reserved for concepts with insufficient information to code with codable children: Secondary | ICD-10-CM | POA: Insufficient documentation

## 2013-05-17 DIAGNOSIS — M25559 Pain in unspecified hip: Secondary | ICD-10-CM | POA: Insufficient documentation

## 2013-05-17 DIAGNOSIS — M1990 Unspecified osteoarthritis, unspecified site: Secondary | ICD-10-CM | POA: Insufficient documentation

## 2013-05-17 DIAGNOSIS — M5416 Radiculopathy, lumbar region: Secondary | ICD-10-CM

## 2013-05-17 DIAGNOSIS — I10 Essential (primary) hypertension: Secondary | ICD-10-CM | POA: Insufficient documentation

## 2013-05-17 DIAGNOSIS — I509 Heart failure, unspecified: Secondary | ICD-10-CM | POA: Insufficient documentation

## 2013-05-17 DIAGNOSIS — Z880 Allergy status to penicillin: Secondary | ICD-10-CM | POA: Insufficient documentation

## 2013-05-17 DIAGNOSIS — I2510 Atherosclerotic heart disease of native coronary artery without angina pectoris: Secondary | ICD-10-CM | POA: Insufficient documentation

## 2013-05-17 MED ORDER — HYDROMORPHONE HCL PF 1 MG/ML IJ SOLN
1.0000 mg | Freq: Once | INTRAMUSCULAR | Status: AC
  Administered 2013-05-17: 1 mg via INTRAMUSCULAR
  Filled 2013-05-17: qty 1

## 2013-05-17 MED ORDER — ONDANSETRON 4 MG PO TBDP
8.0000 mg | ORAL_TABLET | Freq: Once | ORAL | Status: AC
  Administered 2013-05-17: 8 mg via ORAL
  Filled 2013-05-17: qty 2

## 2013-05-17 MED ORDER — OXYCODONE-ACETAMINOPHEN 5-325 MG PO TABS: 1.0000 | ORAL_TABLET | ORAL | Status: DC | PRN

## 2013-05-17 NOTE — ED Notes (Signed)
Pt c/o right sided groin pain that radiates into right buttocks and down into right thigh. Pt reports with standing the pain increases. sts this all started about a week ago. Denies injuring leg/hip. Reports she isn't walking worse than normal, sts she usually can't walk far and uses a cane. Nad, skin warm and dry, resp e/u. Denies numbness/tingling.

## 2013-05-17 NOTE — ED Provider Notes (Signed)
CSN: [id number]     Arrival date & time 05/17/13  1206 History   First MD Initiated Contact with Patient 05/17/13 1218     Chief Complaint  Patient presents with  . Back Pain   (Consider location/radiation/quality/duration/timing/severity/associated sxs/prior Treatment) HPI Comments: Comes to the ER for evaluation of back and hip pain. The patient initially stated that it had been going on for about a week, but family says she has been having pain in this area for months. The last one to 2 weeks, however, has become more severe. Patient reports severe pain in the right groin and hip with low back region or pain that is much worse in the morning when she wakes up. Gets better when she gets up and moves around for the day, but it goes away. She has been taking Tylenol with Codeine that she was prescribed for a left hip bursitis last year but it isn't helping. Patient denies injury. Pain goes down the back of right thigh area but does not go to the knee. It worsens with movement.  Patient is a 72 y.o. female presenting with back pain.  Back Pain   Past Medical History  Diagnosis Date  . Frozen shoulder   . Obese   . HTN (hypertension)     ACEI cough  . Diabetes mellitus   . HLD (hyperlipidemia)   . Palpitation     PACs noted on telemetry while in the hospital  . Nonischemic cardiomyopathy     Ech 3/11 difficult study, moderate aortic insuficiency noted. Left evntriculogram 3/11  . Osteoarthritis   . CAD (coronary artery disease)     nonobstructive let heart cath 3/11: 40% ostial D1, 30% mid CFX  . Aortic insufficiency     mild on 9/11 cath  . CHF (congestive heart failure)    Past Surgical History  Procedure Laterality Date  . Abdominal hysterectomy    . Tubal ligation    . Eye surgery    . Appendectomy     Family History  Problem Relation Age of Onset  . Stroke Mother 64  . Heart attack Father   . Diabetes Sister   . Heart disease Sister   . Diabetes Brother   . Heart  attack Sister    History  Substance Use Topics  . Smoking status: Former Games developer  . Smokeless tobacco: Not on file     Comment: Quit 1970s  . Alcohol Use: Yes   OB History   Grav Para Term Preterm Abortions TAB SAB Ect Mult Living                 Review of Systems  Genitourinary: Negative.   Musculoskeletal: Positive for back pain.  All other systems reviewed and are negative.    Allergies  Iodine and Penicillins  Home Medications   Current Outpatient Rx  Name  Route  Sig  Dispense  Refill  . acetaminophen-codeine (TYLENOL #3) 300-30 MG per tablet   Oral   Take 1 tablet by mouth every 4 (four) hours as needed for moderate pain.         Marland Kitchen alendronate (FOSAMAX) 70 MG tablet   Oral   Take 70 mg by mouth every 7 (seven) days. Take with a full glass of water on an empty stomach. Take on mondays         . aspirin 81 MG tablet   Oral   Take 81 mg by mouth daily.           Marland Kitchen  calcium carbonate (OS-CAL) 600 MG TABS   Oral   Take 600 mg by mouth 2 (two) times daily with a meal.          . carvedilol (COREG) 6.25 MG tablet   Oral   Take 6.25 mg by mouth 2 (two) times daily.           . Cholecalciferol (VITAMIN D) 1000 UNITS capsule   Oral   Take 2,000 Units by mouth daily.          . furosemide (LASIX) 20 MG tablet   Oral   Take 20 mg by mouth daily.           Marland Kitchen levocetirizine (XYZAL) 5 MG tablet   Oral   Take 5 mg by mouth every evening.         Marland Kitchen losartan (COZAAR) 25 MG tablet   Oral   Take 25 mg by mouth daily.          . montelukast (SINGULAIR) 10 MG tablet   Oral   Take 10 mg by mouth at bedtime.         . potassium chloride SA (KLOR-CON M20) 20 MEQ tablet   Oral   Take 20 mEq by mouth daily.          . simvastatin (ZOCOR) 40 MG tablet   Oral   Take 40 mg by mouth at bedtime.            BP 140/106  Pulse 69  Temp(Src) 98 F (36.7 C) (Oral)  Resp 18  SpO2 99% Physical Exam  Constitutional: She is oriented to person,  place, and time. She appears well-developed and well-nourished. No distress.  HENT:  Head: Normocephalic and atraumatic.  Right Ear: Hearing normal.  Left Ear: Hearing normal.  Nose: Nose normal.  Mouth/Throat: Oropharynx is clear and moist and mucous membranes are normal.  Eyes: Conjunctivae and EOM are normal. Pupils are equal, round, and reactive to light.  Neck: Normal range of motion. Neck supple.  Cardiovascular: Regular rhythm, S1 normal and S2 normal.  Exam reveals no gallop and no friction rub.   No murmur heard. Pulmonary/Chest: Effort normal and breath sounds normal. No respiratory distress. She exhibits no tenderness.  Abdominal: Soft. Normal appearance and bowel sounds are normal. There is no hepatosplenomegaly. There is no tenderness. There is no rebound, no guarding, no tenderness at McBurney's point and negative Murphy's sign. No hernia.  Musculoskeletal:       Right hip: She exhibits decreased range of motion. She exhibits no tenderness, no bony tenderness and no deformity.       Lumbar back: She exhibits tenderness.  Neurological: She is alert and oriented to person, place, and time. She has normal strength. No cranial nerve deficit or sensory deficit. Coordination normal. GCS eye subscore is 4. GCS verbal subscore is 5. GCS motor subscore is 6.  Reflex Scores:      Patellar reflexes are 1+ on the right side and 1+ on the left side. Skin: Skin is warm, dry and intact. No rash noted. No cyanosis.  Psychiatric: She has a normal mood and affect. Her speech is normal and behavior is normal. Thought content normal.    ED Course  Procedures (including critical care time) Labs Review Labs Reviewed - No data to display Imaging Review Dg Lumbar Spine Complete  05/17/2013   CLINICAL DATA:  Low back pain.  EXAM: LUMBAR SPINE - COMPLETE 4+ VIEW  COMPARISON:  MRI scan of December 13, 2010.  FINDINGS: Minimal levoscoliosis of lumbar spine is noted. No fracture or spondylolisthesis is  noted. Moderate degenerative disc disease is noted at L3-4. Severe hypertrophy of left-sided posterior facet joint is seen at L4-5 secondary to degenerative joint disease.  IMPRESSION: Moderate degenerative disc disease is seen at L3-4. Severe degenerative joint disease is seen involving the left posterior facet joint at L4-5. No acute abnormality seen in the lumbar spine.   Electronically Signed   By: Roque Lias M.D.   On: 05/17/2013 13:45   Dg Hip Complete Right  05/17/2013   CLINICAL DATA:  Back pain.  Right groin and hip pain.  EXAM: RIGHT HIP - COMPLETE 2+ VIEW  COMPARISON:  None.  FINDINGS: Early osteoarthritic changes in the hips bilaterally. No acute bony abnormality. Specifically, no fracture, subluxation, or dislocation. Soft tissues are intact.  IMPRESSION: No acute bony abnormality.   Electronically Signed   By: Charlett Nose M.D.   On: 05/17/2013 13:46    EKG Interpretation   None       MDM  Diagnosis: Lumbar radiculopathy  Patient presents to the ER for evaluation of low back pain. Patient has been experiencing chronic low back pain and looking through the records, has a history of spinal stenosis. Patient reports that over the past 6 months has had progressive worsening of pain but in the last few days to one week's time it has significantly worsened. She has been using some Tylenol with codeine intermittently without improvement. She does have reproducible pain with range of motion.   Examination is consistent with mechanical back pain with radicular component. X-ray of lumbar spine and hip were performed to rule out other pathology. X-ray of the lumbar spine does show significant degenerative changes which supports the diagnosis of lumbar radiculopathy. X-ray is negative and I have no suspicion for occult fracture.  Patient was administered along with improvement here in the ER. Was prescribed Percocet, followup with her physician for possible repeat MRI (had on in 2012) or  further management.    Gilda Crease, MD 05/19/13 [phone]

## 2013-05-17 NOTE — ED Notes (Signed)
Per pt sts 1 week of lower back pain, right leg pain and inner thigh pain. sts taking tylenol without relief.

## 2013-05-20 ENCOUNTER — Ambulatory Visit: Payer: Medicare Other | Admitting: Cardiology

## 2013-06-09 ENCOUNTER — Ambulatory Visit: Payer: Medicare Other

## 2013-06-15 ENCOUNTER — Emergency Department (HOSPITAL_COMMUNITY)
Admission: EM | Admit: 2013-06-15 | Discharge: 2013-06-15 | Disposition: A | Discharge status: Home / Self Care | Payer: Medicare Other | Attending: Emergency Medicine | Admitting: Emergency Medicine

## 2013-06-15 ENCOUNTER — Encounter (HOSPITAL_COMMUNITY): Payer: Self-pay | Admitting: Emergency Medicine

## 2013-06-15 ENCOUNTER — Emergency Department (HOSPITAL_COMMUNITY): Payer: Medicare Other

## 2013-06-15 DIAGNOSIS — R0601 Orthopnea: Secondary | ICD-10-CM | POA: Insufficient documentation

## 2013-06-15 DIAGNOSIS — Z8739 Personal history of other diseases of the musculoskeletal system and connective tissue: Secondary | ICD-10-CM | POA: Insufficient documentation

## 2013-06-15 DIAGNOSIS — Z9089 Acquired absence of other organs: Secondary | ICD-10-CM | POA: Insufficient documentation

## 2013-06-15 DIAGNOSIS — E785 Hyperlipidemia, unspecified: Secondary | ICD-10-CM | POA: Insufficient documentation

## 2013-06-15 DIAGNOSIS — I10 Essential (primary) hypertension: Secondary | ICD-10-CM | POA: Insufficient documentation

## 2013-06-15 DIAGNOSIS — I2510 Atherosclerotic heart disease of native coronary artery without angina pectoris: Secondary | ICD-10-CM | POA: Insufficient documentation

## 2013-06-15 DIAGNOSIS — Z7982 Long term (current) use of aspirin: Secondary | ICD-10-CM | POA: Insufficient documentation

## 2013-06-15 DIAGNOSIS — Z9851 Tubal ligation status: Secondary | ICD-10-CM | POA: Insufficient documentation

## 2013-06-15 DIAGNOSIS — E119 Type 2 diabetes mellitus without complications: Secondary | ICD-10-CM | POA: Insufficient documentation

## 2013-06-15 DIAGNOSIS — Z87891 Personal history of nicotine dependence: Secondary | ICD-10-CM | POA: Insufficient documentation

## 2013-06-15 DIAGNOSIS — Z880 Allergy status to penicillin: Secondary | ICD-10-CM | POA: Insufficient documentation

## 2013-06-15 DIAGNOSIS — R0981 Nasal congestion: Secondary | ICD-10-CM

## 2013-06-15 DIAGNOSIS — R0602 Shortness of breath: Secondary | ICD-10-CM | POA: Insufficient documentation

## 2013-06-15 DIAGNOSIS — Z90710 Acquired absence of both cervix and uterus: Secondary | ICD-10-CM | POA: Insufficient documentation

## 2013-06-15 DIAGNOSIS — J3489 Other specified disorders of nose and nasal sinuses: Secondary | ICD-10-CM | POA: Insufficient documentation

## 2013-06-15 DIAGNOSIS — M1990 Unspecified osteoarthritis, unspecified site: Secondary | ICD-10-CM | POA: Insufficient documentation

## 2013-06-15 DIAGNOSIS — Z79899 Other long term (current) drug therapy: Secondary | ICD-10-CM | POA: Insufficient documentation

## 2013-06-15 DIAGNOSIS — I509 Heart failure, unspecified: Secondary | ICD-10-CM | POA: Insufficient documentation

## 2013-06-15 LAB — CBC WITH DIFFERENTIAL/PLATELET
Basophils Absolute: 0 10*3/uL (ref 0.0–0.1)
Eosinophils Absolute: 0.3 10*3/uL (ref 0.0–0.7)
Eosinophils Relative: 3 % (ref 0–5)
Lymphocytes Relative: 11 % — ABNORMAL LOW (ref 12–46)
Lymphs Abs: 1.1 10*3/uL (ref 0.7–4.0)
MCV: 95.1 fL (ref 78.0–100.0)
Neutrophils Relative %: 79 % — ABNORMAL HIGH (ref 43–77)
Platelets: 218 10*3/uL (ref 150–400)
RBC: 4.09 MIL/uL (ref 3.87–5.11)
RDW: 13.2 % (ref 11.5–15.5)
WBC: 9.8 10*3/uL (ref 4.0–10.5)

## 2013-06-15 LAB — BASIC METABOLIC PANEL
Calcium: 9 mg/dL (ref 8.4–10.5)
GFR calc Af Amer: >90 mL/min (ref >90)
GFR calc non Af Amer: >90 mL/min (ref >90)
Potassium: 3.7 mEq/L (ref 3.5–5.1)
Sodium: 136 mEq/L (ref 135–145)

## 2013-06-15 LAB — POCT I-STAT TROPONIN I: Troponin i, poc: 0.01 ng/mL (ref 0.00–0.08)

## 2013-06-15 MED ORDER — LORATADINE 10 MG PO TABS: 10.0000 mg | ORAL_TABLET | Freq: Every day | ORAL | Status: DC

## 2013-06-15 MED ORDER — FUROSEMIDE 10 MG/ML IJ SOLN: 40.0000 mg | Freq: Once | INTRAMUSCULAR | Status: DC

## 2013-06-15 NOTE — ED Notes (Signed)
Pt ambulates with a cane.

## 2013-06-15 NOTE — ED Notes (Signed)
Pt reports increased SOB, pt reports hx of CHF. Pt states she feels like she can't breath, pt states she woke up feeling like this. Pt states she has something going on from her head to her belly. Pt states it is hard to breath laying down. Pt is A&O X4. Pt also reports a pinched nerve in her lower back.

## 2013-06-15 NOTE — ED Provider Notes (Signed)
CSN: [id number]     Arrival date & time 06/15/13  0534 History   First MD Initiated Contact with Patient 06/15/13 [phone]     Chief Complaint  Patient presents with  . Shortness of Breath   (Consider location/radiation/quality/duration/timing/severity/associated sxs/prior Treatment) HPI This patient is a morbidly obese 72 year old woman with diabetes, hyperlipidemia, coronary artery disease and history of nonischemic cardiomyopathy with most recent ejection fraction of 50-55%.  She presents with complaints of shortness of breath. Her symptoms began after she walked back and forth to the bathroom from the bedroom, shortly prior to arrival.. She felt SOB once she got back to bed. She also complains of orthopnea. She  Has not had cough, fever or chest pain.   She has not appreciated any change in baseline dependent edema. The patient says she has not taken lasix 20mg  po qd , as prescribed, for the past month because she has a "pinched nerve" and this causes her to have pain with ambulation so, she did not want to have to deal with the urinary frequency which accompanies lasix use.   Past Medical History  Diagnosis Date  . Frozen shoulder   . Obese   . HTN (hypertension)     ACEI cough  . Diabetes mellitus   . HLD (hyperlipidemia)   . Palpitation     PACs noted on telemetry while in the hospital  . Nonischemic cardiomyopathy     Ech 3/11 difficult study, moderate aortic insuficiency noted. Left evntriculogram 3/11  . Osteoarthritis   . CAD (coronary artery disease)     nonobstructive let heart cath 3/11: 40% ostial D1, 30% mid CFX  . Aortic insufficiency     mild on 9/11 cath  . CHF (congestive heart failure)    Past Surgical History  Procedure Laterality Date  . Abdominal hysterectomy    . Tubal ligation    . Eye surgery    . Appendectomy     Family History  Problem Relation Age of Onset  . Stroke Mother 64  . Heart attack Father   . Diabetes Sister   . Heart disease Sister    . Diabetes Brother   . Heart attack Sister    History  Substance Use Topics  . Smoking status: Former Games developer  . Smokeless tobacco: Not on file     Comment: Quit 1970s  . Alcohol Use: Yes   OB History   Grav Para Term Preterm Abortions TAB SAB Ect Mult Living                 Review of Systems Ten point review of symptoms performed and is negative with the exception of symptoms noted above.   Allergies  Iodine and Penicillins  Home Medications   Current Outpatient Rx  Name  Route  Sig  Dispense  Refill  . acetaminophen-codeine (TYLENOL #3) 300-30 MG per tablet   Oral   Take 1 tablet by mouth every 4 (four) hours as needed for moderate pain.         Marland Kitchen alendronate (FOSAMAX) 70 MG tablet   Oral   Take 70 mg by mouth every 7 (seven) days. Take with a full glass of water on an empty stomach. Take on mondays         . aspirin 81 MG tablet   Oral   Take 81 mg by mouth daily.           . calcium carbonate (OS-CAL) 600 MG TABS   Oral  Take 600 mg by mouth 2 (two) times daily with a meal.          . carvedilol (COREG) 6.25 MG tablet   Oral   Take 6.25 mg by mouth 2 (two) times daily.           . Cholecalciferol (VITAMIN D) 1000 UNITS capsule   Oral   Take 2,000 Units by mouth daily.          . furosemide (LASIX) 20 MG tablet   Oral   Take 20 mg by mouth daily as needed for fluid.          Marland Kitchen gabapentin (NEURONTIN) 300 MG capsule   Oral   Take 300 mg by mouth 3 (three) times daily.         Marland Kitchen HYDROcodone-acetaminophen (NORCO/VICODIN) 5-325 MG per tablet   Oral   Take 1 tablet by mouth every 6 (six) hours as needed for moderate pain.         Marland Kitchen levocetirizine (XYZAL) 5 MG tablet   Oral   Take 5 mg by mouth every evening.         Marland Kitchen losartan (COZAAR) 25 MG tablet   Oral   Take 25 mg by mouth daily.          . montelukast (SINGULAIR) 10 MG tablet   Oral   Take 10 mg by mouth at bedtime.         . potassium chloride SA (KLOR-CON M20) 20  MEQ tablet   Oral   Take 20 mEq by mouth daily.          . simvastatin (ZOCOR) 40 MG tablet   Oral   Take 40 mg by mouth at bedtime.            BP 150/31  Pulse 88  Temp(Src) 98.3 F (36.8 C) (Oral)  Resp 22  SpO2 96% Physical Exam Gen: well developed and well nourished appearing, anxious and tearful Head: NCAT Eyes: PERL, EOMI Nose: no epistaixis or rhinorrhea Mouth/throat: mucosa is moist and pink Neck: supple, no stridor Lungs: CTA B, no wheezing, rhonchi or rales CV: regular rate and rythm, good distal pulses.  Abd: obese, soft, notender, nondistended Back: no ttp, no cva ttp Skin: warm and dry Ext: no edema, normal to inspection Neuro: CN ii-xii grossly intact, no focal deficits Psyche; tearful and anxious l affect,  cooperative.  ED Course  Procedures (including critical care time) Labs Review  Results for orders placed during the hospital encounter of 06/15/13 (from the past 24 hour(s))  CBC WITH DIFFERENTIAL     Status: Abnormal   Collection Time    06/15/13  7:30 AM      Result Value Range   WBC 9.8  4.0 - 10.5 K/uL   RBC 4.09  3.87 - 5.11 MIL/uL   Hemoglobin 12.7  12.0 - 15.0 g/dL   HCT 98.1  19.1 - 47.8 %   MCV 95.1  78.0 - 100.0 fL   MCH 31.1  26.0 - 34.0 pg   MCHC 32.6  30.0 - 36.0 g/dL   RDW 29.5  62.1 - 30.8 %   Platelets 218  150 - 400 K/uL   Neutrophils Relative % 79 (*) 43 - 77 %   Neutro Abs 7.8 (*) 1.7 - 7.7 K/uL   Lymphocytes Relative 11 (*) 12 - 46 %   Lymphs Abs 1.1  0.7 - 4.0 K/uL   Monocytes Relative 6  3 - 12 %  Monocytes Absolute 0.6  0.1 - 1.0 K/uL   Eosinophils Relative 3  0 - 5 %   Eosinophils Absolute 0.3  0.0 - 0.7 K/uL   Basophils Relative 0  0 - 1 %   Basophils Absolute 0.0  0.0 - 0.1 K/uL  POCT I-STAT TROPONIN I     Status: None   Collection Time    06/15/13  7:50 AM      Result Value Range   Troponin i, poc 0.01  0.00 - 0.08 ng/mL   Comment 3             EKG:  Nsr, no acute ischemic changes, normal axis,  normal qrs, normal intervals.   DG Chest 2 View (Final result)  Result time: 06/15/13 16:10:96    Final result by Rad Results In Interface (06/15/13 07:02:28)    Narrative:   CLINICAL DATA: Shortness of Breath  EXAM: CHEST 2 VIEW  COMPARISON: September 04, 2009  FINDINGS: There is no edema or consolidation. No appreciable effusions. There is cardiomegaly with mild pulmonary venous hypertension. No adenopathy. No bone lesions.  IMPRESSION: Cardiomegaly with pulmonary venous hypertension. Question early congestive heart failure. There is no frank edema, however. No airspace consolidation.   Electronically Signed By: Bretta Bang M.D. On: 06/15/2013 07:02         MDM   ED work up is non-diagnostic and the patient has maintained oxygen saturations in the 99 to 100% range on RA throughout her ED stay. No signs of any acute process on the CXR. I believe that the patient's sx stem from her non-compliance with lasix but, I do not believe that hospitalization is indicated. I have counseled the patient re: importance of compliance with lasix and she voices understanding She has mentioned concern re: nasal congestion and I have recommended that she try a decongestant. We will ambulate in anticipation of discharge home with close outpatient f/u.    Brandt Loosen, MD 06/19/13 [phone]

## 2013-06-17 ENCOUNTER — Other Ambulatory Visit: Payer: Self-pay | Admitting: Orthopedic Surgery

## 2013-06-17 DIAGNOSIS — M549 Dorsalgia, unspecified: Secondary | ICD-10-CM

## 2013-06-23 ENCOUNTER — Other Ambulatory Visit: Payer: Self-pay | Admitting: *Deleted

## 2013-06-23 ENCOUNTER — Encounter: Payer: Self-pay | Admitting: *Deleted

## 2013-06-28 ENCOUNTER — Ambulatory Visit
Admission: RE | Admit: 2013-06-28 | Discharge: 2013-06-28 | Disposition: A | Discharge status: Home / Self Care | Payer: Medicare Other | Source: Ambulatory Visit | Attending: Orthopedic Surgery | Admitting: Orthopedic Surgery

## 2013-06-28 DIAGNOSIS — M549 Dorsalgia, unspecified: Secondary | ICD-10-CM

## 2013-07-01 ENCOUNTER — Encounter: Payer: Self-pay | Admitting: Cardiology

## 2013-07-01 ENCOUNTER — Ambulatory Visit (INDEPENDENT_AMBULATORY_CARE_PROVIDER_SITE_OTHER): Payer: Medicare Other | Admitting: Cardiology

## 2013-07-01 VITALS — BP 118/66 | HR 72 | Ht 60.0 in | Wt 245.0 lb

## 2013-07-01 DIAGNOSIS — E785 Hyperlipidemia, unspecified: Secondary | ICD-10-CM

## 2013-07-01 DIAGNOSIS — R0600 Dyspnea, unspecified: Secondary | ICD-10-CM

## 2013-07-01 DIAGNOSIS — I10 Essential (primary) hypertension: Secondary | ICD-10-CM

## 2013-07-01 DIAGNOSIS — R0609 Other forms of dyspnea: Secondary | ICD-10-CM

## 2013-07-01 DIAGNOSIS — R0989 Other specified symptoms and signs involving the circulatory and respiratory systems: Principal | ICD-10-CM

## 2013-07-01 LAB — BASIC METABOLIC PANEL
BUN: 15 mg/dL (ref 6–23)
CHLORIDE: 101 meq/L (ref 96–112)
CO2: 28 meq/L (ref 19–32)
CREATININE: 0.7 mg/dL (ref 0.4–1.2)
Calcium: 9.2 mg/dL (ref 8.4–10.5)
GFR: 100.67 mL/min (ref >60.00)
Glucose, Bld: 105 mg/dL — ABNORMAL HIGH (ref 70–99)
Potassium: 4 mEq/L (ref 3.5–5.1)
SODIUM: 137 meq/L (ref 135–145)

## 2013-07-01 LAB — BRAIN NATRIURETIC PEPTIDE: PRO B NATRI PEPTIDE: 26 pg/mL (ref 0.0–100.0)

## 2013-07-01 NOTE — Progress Notes (Signed)
Patient ID: Julie Graves, female   DOB: 1940/07/14, 73 y.o.   MRN: [id number] PCP: Dr. Ernie Hew  73 yo with history of HTN and obesity was admitted to [facility] in 3/11 with atypical chest pain and palpitations. She ended up having a heart catheterization that showed nonobstructive CAD, but EF was decreased at 40%. She was also diagnosed with diabetes. Her palpitations seemed to correlate with PACs on telemetry. Echo in 9/11 showed recovery of LV systolic function (EF 51-70%).  Repeat echo in 8/12 showed EF 50-55% with mild AI and mild MR.  She had ETT-Cardiolite in 10/13 with poor exercise tolerance and a small apical defect suspicious for attenuation, EF 51%.   Patient was seen in the ER in 12/14 with dyspnea.  She had stopped her Lasix for about a month.  She restarted Lasix and has been taking daily since.  She is not very active because of back pain/sciatica, but she reports no dyspnea when walking around her house.  No orthopnea/PND.  No chest pain.  Weight is down 8 lbs since last appointment here.      Labs (3/11): K 4.3, creatinine 0.69, BNP 41, LDL 82, HDL 49  Labs (4/11): K 4.3, creatinine 0.7, BNP 75  Labs (5/11): LDL 53, HDL 52, LFTs normal  Labs (6/11): creatinine 0.6, K 4.5  Labs (9/11): K 4.4, creatinine 0.7  Labs (11/11): LDL 71, HDL 60, TGs 128, LFTs normal  Labs (8/12): TSH normal, BNP 80, HCT 39.3, LDL 64, HDL 66, K 4.4, creatinine 0.6 Labs (3/13): K 4.1, creatinine 0.7, LDL 47, HDL 56 Las (10/13): LDL 56, HDL 56 Labs (12/14): K 3.7, creatinine 0.52, BNP 137  Allergies:  1) ! Iodine  2) ! Penicillin   Past Medical History:  1. Frozen shoulder  2. Obesity  3. HTN: ACEI cough.  4. Diabetes  5. Hyperlipidemia  6. Palpitations: PACs noted on telemetry while in the hospital  7. Nonischemic cardiomyopathy: Echo (3/11): Difficult study, moderate aortic insufficiency noted. Left ventriculogram (3/11): EF 40%, global hypokinesis. Echo (9/11): EF 55-60%, moderate diastolic  dysfunction, mild aortic insufficiency.  Echo (8/12): EF 50-55%, mild AI, mild MR.  Cardiolite 10/13 with EF 51%.  8. Osteoarthritis  9. CAD (nonobstructive): Left heart cath (3/11): 40% ostial D1, 30% mid CFX. ETT-Cardiolite (10/13) with 2'59" exercise, small apical perfusion defect concerning for attenuation, EF 51% with global HK.  10. Aortic insufficiency: Mild on 9/11 cath.  11. Low back pain/sciatica  Family History:  Mother: CVA at 66  Siblings: sister has diabetes and heart disease.  Siblings: sister who is deceased in her 93s of an MI.  Siblings: brother with diabetes  Father: deceased at the age of 21 with an MI.   Social History:  Retired Hotel manager, lives in [place]. 3 children.  Tobacco Use - No, quit 1970s.  Alcohol Use - yes  Drug Use - no  Current Outpatient Prescriptions  Medication Sig Dispense Refill  . acetaminophen-codeine (TYLENOL #3) 300-30 MG per tablet Take 1 tablet by mouth every 4 (four) hours as needed for moderate pain.      Marland Kitchen alendronate (FOSAMAX) 70 MG tablet Take 70 mg by mouth every 7 (seven) days. Take with a full glass of water on an empty stomach. Take on mondays      . aspirin 81 MG tablet Take 81 mg by mouth daily.        . calcium carbonate (OS-CAL) 600 MG TABS Take 600 mg by mouth 2 (  two) times daily with a meal.       . carvedilol (COREG) 6.25 MG tablet Take 6.25 mg by mouth 2 (two) times daily.        . Cholecalciferol (VITAMIN D) 1000 UNITS capsule Take 2,000 Units by mouth daily.       . furosemide (LASIX) 20 MG tablet Take 20 mg by mouth daily      . HYDROcodone-acetaminophen (NORCO/VICODIN) 5-325 MG per tablet Take 1 tablet by mouth every 6 (six) hours as needed for moderate pain.      Marland Kitchen loratadine (CLARITIN) 10 MG tablet Take 1 tablet (10 mg total) by mouth daily. One po daily x 5 days  20 tablet  0  . losartan (COZAAR) 25 MG tablet Take 25 mg by mouth daily.       . montelukast (SINGULAIR) 10 MG tablet Take 10 mg by mouth at  bedtime.      . potassium chloride SA (KLOR-CON M20) 20 MEQ tablet Take 20 mEq by mouth daily.       . simvastatin (ZOCOR) 40 MG tablet Take 40 mg by mouth at bedtime.        . [DISCONTINUED] sertraline (ZOLOFT) 100 MG tablet Take 100 mg by mouth daily.         No current facility-administered medications for this visit.    BP 118/66  Pulse 72  Ht 5' (1.524 m)  Wt 111.131 kg (245 lb)  BMI 47.85 kg/m2 General: NAD, obese.  Neck: JVP not elevated, no thyromegaly or thyroid nodule.  Lungs: Clear to auscultation bilaterally with normal respiratory effort. CV: Nondisplaced PMI.  Heart regular S1/S2, no S3/S4, no murmur.  1+ ankle edema.  No carotid bruit.  Normal pedal pulses.  Abdomen: Soft, nontender, no hepatosplenomegaly, no distention.  Neurologic: Alert and oriented x 3.  Psych: Normal affect. Extremities: No clubbing or cyanosis.   Assessment/Plan: HYPERLIPIDEMIA-MIXED  Continue simvastatin. HYPERTENSION, UNSPECIFIED  BP seems to be under reasonable control on current regimen.  Chronic diastolic CHF Patient is feeling much better back on Lasix.  She is not limited by dyspnea, rather by back pain.  Continue Lasix 20 mg daily.  I will get a BMET and BNP today.  Obesity She needs weight loss.  However, exercise will be hard with back pain.  Continue working on [employer]. Would be reasonable to consider bariatric surgery evaluation.   Loralie Champagne 07/01/2013

## 2013-07-01 NOTE — Patient Instructions (Addendum)
Take lasix (furosemide) 20mg  daily every day.  Your physician recommends that you have lab work today--BMET/BNP.  Your physician wants you to follow-up in: 1 year with Dr Aundra Dubin. (January 2016).  You will receive a reminder letter in the mail two months in advance. If you don't receive a letter, please call our office to schedule the follow-up appointment.

## 2013-07-09 ENCOUNTER — Ambulatory Visit: Payer: Medicare Other

## 2013-08-25 ENCOUNTER — Ambulatory Visit: Payer: Medicare Other

## 2013-09-09 ENCOUNTER — Ambulatory Visit
Admission: RE | Admit: 2013-09-09 | Discharge: 2013-09-09 | Disposition: A | Discharge status: Home / Self Care | Payer: Medicare Other | Source: Ambulatory Visit

## 2013-09-09 DIAGNOSIS — Z1231 Encounter for screening mammogram for malignant neoplasm of breast: Secondary | ICD-10-CM

## 2013-10-06 ENCOUNTER — Encounter (HOSPITAL_COMMUNITY): Payer: Self-pay | Admitting: Pharmacy Technician

## 2013-10-09 ENCOUNTER — Encounter (HOSPITAL_COMMUNITY): Payer: Self-pay | Admitting: Vascular Surgery

## 2013-10-09 ENCOUNTER — Encounter (HOSPITAL_COMMUNITY)
Admission: RE | Admit: 2013-10-09 | Discharge: 2013-10-09 | Disposition: A | Discharge status: Home / Self Care | Payer: Medicare Other | Source: Ambulatory Visit | Attending: Orthopaedic Surgery | Admitting: Orthopaedic Surgery

## 2013-10-09 ENCOUNTER — Other Ambulatory Visit (HOSPITAL_COMMUNITY): Payer: Self-pay | Admitting: Orthopaedic Surgery

## 2013-10-09 ENCOUNTER — Ambulatory Visit (HOSPITAL_COMMUNITY)
Admission: RE | Admit: 2013-10-09 | Discharge: 2013-10-09 | Disposition: A | Discharge status: Home / Self Care | Payer: Medicare Other | Source: Ambulatory Visit | Attending: Orthopaedic Surgery | Admitting: Orthopaedic Surgery

## 2013-10-09 ENCOUNTER — Encounter (HOSPITAL_COMMUNITY): Payer: Self-pay

## 2013-10-09 DIAGNOSIS — Z01810 Encounter for preprocedural cardiovascular examination: Secondary | ICD-10-CM | POA: Insufficient documentation

## 2013-10-09 DIAGNOSIS — Z01818 Encounter for other preprocedural examination: Secondary | ICD-10-CM | POA: Insufficient documentation

## 2013-10-09 HISTORY — DX: Gastro-esophageal reflux disease without esophagitis: K21.9

## 2013-10-09 HISTORY — DX: Stress incontinence (female) (male): N39.3

## 2013-10-09 HISTORY — DX: Angina pectoris, unspecified: I20.9

## 2013-10-09 HISTORY — DX: Anxiety disorder, unspecified: F41.9

## 2013-10-09 HISTORY — DX: Pneumonia, unspecified organism: J18.9

## 2013-10-09 LAB — TYPE AND SCREEN
ABO/RH(D): AB POS
ANTIBODY SCREEN: NEGATIVE

## 2013-10-09 LAB — ABO/RH: ABO/RH(D): AB POS

## 2013-10-09 LAB — CBC
HCT: 42.2 % (ref 36.0–46.0)
HEMOGLOBIN: 13.7 g/dL (ref 12.0–15.0)
MCH: 30.2 pg (ref 26.0–34.0)
MCHC: 32.5 g/dL (ref 30.0–36.0)
MCV: 93 fL (ref 78.0–100.0)
PLATELETS: 267 10*3/uL (ref 150–400)
RBC: 4.54 MIL/uL (ref 3.87–5.11)
RDW: 13 % (ref 11.5–15.5)
WBC: 8.6 10*3/uL (ref 4.0–10.5)

## 2013-10-09 LAB — URINALYSIS, ROUTINE W REFLEX MICROSCOPIC
BILIRUBIN URINE: NEGATIVE
GLUCOSE, UA: NEGATIVE mg/dL
HGB URINE DIPSTICK: NEGATIVE
KETONES UR: NEGATIVE mg/dL
Leukocytes, UA: NEGATIVE
Nitrite: NEGATIVE
PROTEIN: NEGATIVE mg/dL
Specific Gravity, Urine: 1.015 (ref 1.005–1.030)
UROBILINOGEN UA: 0.2 mg/dL (ref 0.0–1.0)
pH: 5.5 (ref 5.0–8.0)

## 2013-10-09 LAB — COMPREHENSIVE METABOLIC PANEL
ALT: 10 U/L (ref 0–35)
AST: 14 U/L (ref 0–37)
Albumin: 4 g/dL (ref 3.5–5.2)
Alkaline Phosphatase: 46 U/L (ref 39–117)
BUN: 16 mg/dL (ref 6–23)
CALCIUM: 9.5 mg/dL (ref 8.4–10.5)
CO2: 28 meq/L (ref 19–32)
CREATININE: 0.75 mg/dL (ref 0.50–1.10)
Chloride: 101 mEq/L (ref 96–112)
GFR calc non Af Amer: 83 mL/min — ABNORMAL LOW (ref >90)
Glucose, Bld: 109 mg/dL — ABNORMAL HIGH (ref 70–99)
Potassium: 4 mEq/L (ref 3.7–5.3)
Sodium: 140 mEq/L (ref 137–147)
TOTAL PROTEIN: 7.2 g/dL (ref 6.0–8.3)
Total Bilirubin: 0.9 mg/dL (ref 0.3–1.2)

## 2013-10-09 LAB — PROTIME-INR
INR: 0.97 (ref 0.00–1.49)
PROTHROMBIN TIME: 12.7 s (ref 11.6–15.2)

## 2013-10-09 LAB — SURGICAL PCR SCREEN
MRSA, PCR: NEGATIVE
Staphylococcus aureus: NEGATIVE

## 2013-10-09 LAB — APTT: APTT: 29 s (ref 24–37)

## 2013-10-09 NOTE — Progress Notes (Signed)
req'd clearance notes, tests ,ekg from dr Aundra Dubin

## 2013-10-09 NOTE — Progress Notes (Signed)
Anesthesia PAT Evaluation:  Patient is a 73 year old female scheduled for right TKA on 10/19/13 by Dr. Lorin Mercy.  History includes former smoker, hypertension, nonischemic cardiomyopathy (mild non-obstructive CAD by 08/2009 cath), mild AR/MR by 2012 echo, congestive heart failure, pre-diabetes mellitus type 2 (no medications), anxiety, GERD, hyperlipidemia, osteoarthritis, palpitations, stress incontinence, hysterectomy, appendectomy. BMI is 45 consistent with morbid obesity. PCP is Dr. Billey Chang.   Cardiologist is Dr. Loralie Champagne, last visit 07/01/13. Overall, she appeared to be doing well at that time. She did report an episode of chest pain, somewhat sharp, when she bent over to gather her laundry on 10/05/13 AM.  Pain was still present but felt immediately better when she stood upright.  It was completely resolved within a hour. She had a pain in the same area later that evening but this was more of a quick "flash" of pain.  She has had no further episodes.  There were no associated symptoms such as SOB, radiation, arm or jaw pain, nausea, palpitations, diaphoresis.  She has had no exertional chest pain. She has chronic LE edema--her thighs feel larger since her steroid injection, but otherwise no significant change in her pedal edema or in her weight.  She denies SOB at rest and has chronic DOE which she feels is stable.  Her activity is fairly limited due to her knee pain/feeling like it will give out.   Nuclear stress test on 03/26/12 showed: Overall Impression: Low risk stress nuclear study. Poor exercise tolerance. There was a small, mild reversible apical perfusion defect. Cannot rule out a small area of ischemia but given prominent breast shadowing on planar images, this may represent shifting breast artifact. LV Ejection Fraction: 51%. LV Wall Motion: Mild global hypokinesis. This is comparable to prior echoes.  (Reviewed by Dr. Aundra Dubin who felt: "Low risk study. I do not think this is suggestive of  a significant blockage. EF consistent with the past.")  Echo on 01/24/11 showed: - Left ventricle: The cavity size was mildly dilated. Wall thickness was normal. Systolic function was normal. The estimated ejection fraction was in the range of 50% to 55%. - Aortic valve: Mild regurgitation. - Mitral valve: Mild regurgitation. - Left atrium: The atrium was mildly dilated. - Atrial septum: No defect or patent foramen ovale was identified. - Tricuspid valve: Mild regurgitation.  Incomplete cardiac CT (due to IV filtration) on 09/06/09 showed: Moderate abolute calcium score (290.1 Agatston units). This places her in the 91st percentile for her age, gender, and ethnicity (high risk). She ultimately had a cardiac cath on 09/07/09 that showed: Left main: No evidence of disease. LAD: Large vessel that coursed to the apex. There was at least moderate calcification throughout the proximal portion of the vessel. There appeared to be a mild 20% plaque in the proximal and midportion of the vessel. There were no flow limiting lesions in this vessel. There is a moderate size bifurcating diagonal branch that had a 40% stenosis at the ostium. CX: Gave off a large OM1 branch that was free of significant disease. OM 2 was small with no disease. I 13 was moderate with mild 20% plaque. The mid circumflex vessel appear to have 30% stenosis. There were no flow limiting lesions in this vessel. RCA: Moderate-sized dominant vessel with mild luminal irregularities throughout the proximal midportion. The PDA and posterior lateral branch and no disease. LVEF was 40% with mild global left ventricular systolic dysfunction and global hypokinesis. No significant mitral regurgitation. Aortic root was not enlarged.  EKG on 10/09/13 showed SB @ 55 bpm, LAD, LVH. Overall, I think her EKG is stable when compared to previous tracing.  CXR on 10/09/13 showed: No active cardiopulmonary disease.  Preoperative labs noted. Cr 0.75.  Glucose 109.   CBC, UA, PT/PTT WNL.  I reviewed above with anesthesiologist Dr. Tamala Julian.  Patient with recent cardiology follow-up and stress test within the past two years.  Her episode of chest pain when bending over sounded atypical, possible GI related, as it started to improved once upright.  She has no new CHF symptoms.  If no new or progressive CV/CHF symptoms then it is anticipated that she can proceed as planned.    George Hugh [facility] Short Stay Center/Anesthesiology Phone [phone] 10/09/2013 5:18 PM

## 2013-10-09 NOTE — Pre-Procedure Instructions (Addendum)
Julie Graves  10/09/2013   Your procedure is scheduled on:  10/19/13  Report to [facility] short stay admitting at 96 AM.  Call this number if you have problems the morning of surgery: [phone]   Remember:   Do not eat food or drink liquids after midnight.   Take these only these medicines the morning of surgery with A SIP OF WATER: carvedilol. Pain med if needed          Take all reg meds as ordered until day of surgery except as instructed below or by dr       Bridgette Habermann all herbel meds, nsaids (aleve,naproxen,advil,ibuprofen) 5 days prior to surgery including aspirin, vitamins   Do not wear jewelry, make-up or nail polish.  Do not wear lotions, powders, or perfumes. You may wear deodorant.  Do not shave 48 hours prior to surgery. Men may shave face and neck.  Do not bring valuables to the hospital.  [facility] is not responsible                  for any belongings or valuables.               Contacts, dentures or bridgework may not be worn into surgery.  Leave suitcase in the car. After surgery it may be brought to your room.  For patients admitted to the hospital, discharge time is determined by your                treatment team.               Patients discharged the day of surgery will not be allowed to drive  home.  Name and phone number of your driver:   Special Instructions:  Special Instructions: [place] - Preparing for Surgery  Before surgery, you can play an important role.  Because skin is not sterile, your skin needs to be as free of germs as possible.  You can reduce the number of germs on you skin by washing with CHG (chlorahexidine gluconate) soap before surgery.  CHG is an antiseptic cleaner which kills germs and bonds with the skin to continue killing germs even after washing.  Please DO NOT use if you have an allergy to CHG or antibacterial soaps.  If your skin becomes reddened/irritated stop using the CHG and inform your nurse when you arrive at Short  Stay.  Do not shave (including legs and underarms) for at least 48 hours prior to the first CHG shower.  You may shave your face.  Please follow these instructions carefully:   1.  Shower with CHG Soap the night before surgery and the morning of Surgery.  2.  If you choose to wash your hair, wash your hair first as usual with your normal shampoo.  3.  After you shampoo, rinse your hair and body thoroughly to remove the Shampoo.  4.  Use CHG as you would any other liquid soap.  You can apply chg directly  to the skin and wash gently with scrungie or a clean washcloth.  5.  Apply the CHG Soap to your body ONLY FROM THE NECK DOWN.  Do not use on open wounds or open sores.  Avoid contact with your eyes ears, mouth and genitals (private parts).  Wash genitals (private parts)       with your normal soap.  6.  Wash thoroughly, paying special attention to the area where your surgery will be performed.  7.  Thoroughly rinse your body with warm water from the neck down.  8.  DO NOT shower/wash with your normal soap after using and rinsing off the CHG Soap.  9.  Pat yourself dry with a clean towel.            10.  Wear clean pajamas.            11.  Place clean sheets on your bed the night of your first shower and do not sleep with pets.  Day of Surgery  Do not apply any lotions/deodorants the morning of surgery.  Please wear clean clothes to the hospital/surgery center.   Please read over the following fact sheets that you were given: Pain Booklet, Coughing and Deep Breathing, Blood Transfusion Information, Total Joint Packet, MRSA Information and Surgical Site Infection Prevention

## 2013-10-12 ENCOUNTER — Telehealth: Payer: Self-pay | Admitting: *Deleted

## 2013-10-12 NOTE — Telephone Encounter (Signed)
Dr Aundra Dubin received a request from Dr Lorin Mercy 10/09/13 requesting clearance for knee surgery. Dr Aundra Dubin cleared for surgery if pt not having any new symptoms. I spoke with patient and she states she has had a sharp pain in her chest when bending over that lasts a few minutes. This has happened 2 times. I scheduled pt an appt to see Richardson Dopp, PAc 10/27/13 2PM for clearance prior to knee surgery.  Pt is going to notify Dr Lorin Mercy.  I have forwarded clearance form request to HIM to fax to Dr Lorin Mercy with these notes.

## 2013-10-12 NOTE — H&P (Signed)
TOTAL KNEE ADMISSION H&P  Patient is being admitted for right total knee arthroplasty.  Subjective:  Chief Complaint:right knee pain.  HPI: Julie Graves, 73 y.o. female, has a history of pain and functional disability in the right knee due to arthritis and has failed non-surgical conservative treatments for greater than 12 weeks to includeNSAID's and/or analgesics, corticosteriod injections and use of assistive devices.  Onset of symptoms was gradual, starting 2 years ago with gradually worsening course since that time. The patient noted no past surgery on the right knee(s).  Patient currently rates pain in the right knee(s) at 8 out of 10 with activity. Patient has worsening of pain with activity and weight bearing, crepitus and joint swelling.  Patient has evidence of subchondral sclerosis, periarticular osteophytes and joint space narrowing by imaging studies.  There is no active infection.  Patient Active Problem List   Diagnosis Date Noted  . Urge incontinence 04/02/2013  . Spinal stenosis, lumbar region, without neurogenic claudication 10/27/2012  . Spinal stenosis of lumbar region 10/01/2012  . Hyperlipidemia 05/27/2012  . Allergic rhinitis 09/26/2011  . Dizziness 01/23/2011  . Dyspnea 01/23/2011  . Type II diabetes mellitus 06/27/2010  . Morbid obesity 06/27/2010  . Coronary atherosclerosis 06/22/2010  . Hypertension, benign 06/22/2010  . Disorder of bone and cartilage 06/22/2010  . Primary cardiomyopathy 03/23/2010  . HYPERLIPIDEMIA-MIXED 03/07/2010  . HYPERTENSION, UNSPECIFIED 03/07/2010  . CARDIOMEGALY 11/30/2009  . OTHER DYSPNEA AND RESPIRATORY ABNORMALITIES 11/30/2009  . CAD 09/28/2009  . AORTIC REGURGITATION 09/28/2009  . CHRONIC SYSTOLIC HEART FAILURE 87/56/4332  . 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 Osteoarthrosis involving multiple sites 10/26/2008   Past Medical History  Diagnosis Date  . Frozen shoulder   . Obese   . HTN (hypertension)     ACEI  cough  . HLD (hyperlipidemia)   . Palpitation     PACs noted on telemetry while in the hospital  . Nonischemic cardiomyopathy     Ech 3/11 difficult study, moderate aortic insuficiency noted. Left evntriculogram 3/11  . Osteoarthritis   . CAD (coronary artery disease)     nonobstructive let heart cath 3/11: 40% ostial D1, 30% mid CFX  . Aortic insufficiency     mild on 9/11 cath  . CHF (congestive heart failure)   . Anginal pain     "small pain?nerves in chest"started 10/05/13   . Pneumonia     hx  . Diabetes mellitus     no meds  . Anxiety     rx given recent not taken yet  . GERD (gastroesophageal reflux disease)     occ  . Stress incontinence     Past Surgical History  Procedure Laterality Date  . Abdominal hysterectomy    . Tubal ligation    . Appendectomy    . Eye surgery    . Bladder neck suspension  85    No prescriptions prior to admission   Allergies  Allergen Reactions  . Other Swelling    Shellfish- swelling throat and lips  . Iodine Hives and Swelling    REACTION: swelling  . Lisinopril     cough  . Penicillins Hives    REACTION: rash    History  Substance Use Topics  . Smoking status: Former Smoker -- 1.00 packs/day for 15 years    Types: Cigarettes    Quit date: 10/10/1983  . Smokeless tobacco: Not on file     Comment: Quit 1970s  . Alcohol Use: No    Family  History  Problem Relation Age of Onset  . Stroke Mother 30  . Heart attack Father   . Diabetes Sister   . Heart disease Sister   . Diabetes Brother   . Heart attack Sister      Review of Systems  Musculoskeletal: Positive for joint pain.       Right knee pain worsening over past several months  All other systems reviewed and are negative.   Objective:  Physical Exam  Constitutional: She is oriented to person, place, and time. She appears well-developed and well-nourished.  HENT:  Head: Normocephalic and atraumatic.  Eyes: EOM are normal. Pupils are equal, round, and reactive  to light.  Neck: Normal range of motion. Neck supple.  Cardiovascular: Normal rate.   Respiratory: Effort normal.  GI: Soft.  Musculoskeletal:    No pain with hip range of motion.  She has significant crepitus right knee, medial joint line tenderness, mild varus deformity less than 10 degrees.  Distal pulses are 2+.  Trace pitting edema.   She ambulates only with a cane.  Has a pronounced limp.  Crepitus with knee range of motion.  Quad strength is good.  Ankle range of motion is normal.   Neurological: She is alert and oriented to person, place, and time.  Skin: Skin is warm and dry.    Vital signs in last 24 hours:    Labs:   Estimated body mass index is 47.85 kg/(m^2) as calculated from the following:   Height as of 07/01/13: 5' (1.524 m).   Weight as of 07/01/13: 111.131 kg (245 lb).   Imaging Review Plain radiographs demonstrate severe degenerative joint disease of the right knee(s). The overall alignment ismild varus. The bone quality appears to be adequate for age and reported activity level.  Assessment/Plan:  End stage arthritis, right knee   The patient history, physical examination, clinical judgment of the provider and imaging studies are consistent with end stage degenerative joint disease of the right knee(s) and total knee arthroplasty is deemed medically necessary. The treatment options including medical management, injection therapy arthroscopy and arthroplasty were discussed at length. The risks and benefits of total knee arthroplasty were presented and reviewed. The risks due to aseptic loosening, infection, stiffness, patella tracking problems, thromboembolic complications and other imponderables were discussed. The patient acknowledged the explanation, agreed to proceed with the plan and consent was signed. Patient is being admitted for inpatient treatment for surgery, pain control, PT, OT, prophylactic antibiotics, VTE prophylaxis, progressive ambulation and ADL's and  discharge planning. The patient is planning to be discharged to skilled nursing facility

## 2013-10-13 ENCOUNTER — Telehealth: Payer: Self-pay | Admitting: Cardiology

## 2013-10-13 NOTE — Telephone Encounter (Signed)
Received request from Nurse fax box, documents faxed for surgical clearance. To: [employer] number: [phone] Attention: [phone]/[id number]

## 2013-10-19 ENCOUNTER — Inpatient Hospital Stay (HOSPITAL_COMMUNITY): Admission: RE | Admit: 2013-10-19 | Payer: Medicare Other | Source: Ambulatory Visit | Admitting: Orthopaedic Surgery

## 2013-10-19 ENCOUNTER — Encounter (HOSPITAL_COMMUNITY): Admission: RE | Payer: Self-pay | Source: Ambulatory Visit

## 2013-10-19 SURGERY — ARTHROPLASTY, KNEE, TOTAL, USING IMAGELESS COMPUTER-ASSISTED NAVIGATION
Anesthesia: General | Site: Knee | Laterality: Right

## 2013-10-27 ENCOUNTER — Encounter: Payer: Self-pay | Admitting: Physician Assistant

## 2013-10-27 ENCOUNTER — Ambulatory Visit (INDEPENDENT_AMBULATORY_CARE_PROVIDER_SITE_OTHER): Payer: Medicare Other | Admitting: Physician Assistant

## 2013-10-27 VITALS — BP 130/60 | HR 59 | Ht 60.0 in | Wt 230.0 lb

## 2013-10-27 DIAGNOSIS — I10 Essential (primary) hypertension: Secondary | ICD-10-CM

## 2013-10-27 DIAGNOSIS — I5032 Chronic diastolic (congestive) heart failure: Secondary | ICD-10-CM

## 2013-10-27 DIAGNOSIS — E785 Hyperlipidemia, unspecified: Secondary | ICD-10-CM

## 2013-10-27 DIAGNOSIS — R079 Chest pain, unspecified: Secondary | ICD-10-CM

## 2013-10-27 DIAGNOSIS — Z01810 Encounter for preprocedural cardiovascular examination: Secondary | ICD-10-CM

## 2013-10-27 DIAGNOSIS — I2510 Atherosclerotic heart disease of native coronary artery without angina pectoris: Secondary | ICD-10-CM

## 2013-10-27 DIAGNOSIS — I359 Nonrheumatic aortic valve disorder, unspecified: Secondary | ICD-10-CM

## 2013-10-27 NOTE — Patient Instructions (Signed)
Your physician has requested that you have a lexiscan myoview. For further information please visit [email or web address]. Please follow instruction sheet, as given.  Your physician recommends that you continue on your current medications as directed. Please refer to the Current Medication list given to you today.

## 2013-10-27 NOTE — Progress Notes (Signed)
[place], [place], [place]  [address] Phone: [phone] Fax:  [phone]  Date:  10/27/2013   ID:  Julie Graves, DOB [birth year], MRN [id number]  PCP:  Leamon Arnt, MD  Cardiologist:  Dr. Loralie Champagne    History of Present Illness: Julie Graves is a 73 y.o. female with a hx of HTN, CAD, cardiomyopathy, diabetes and obesity.  Admitted to [facility] in 08/2009 with atypical chest pain and palpitations. LHC showed nonobstructive CAD, but EF was decreased at 40%. Her palpitations seemed to correlate with PACs on telemetry. Echo in 9/11 showed recovery of LV systolic function (EF 38-10%). Repeat echo in 8/12 showed EF 50-55% with mild AI and mild MR.  She had ETT-Cardiolite in 10/13 with poor exercise tolerance and a small apical defect suspicious for attenuation, EF 51%.    Last seen by Dr. Loralie Champagne in 06/2013.  She needs R TKR with Dr. Lorin Mercy.  She was scheduled last week, but this was cancelled as the patient was having chest pain recently.  She returns for surgical clearance.  She notes sharp chest pain with bending over about 2 weeks ago.  Since then, she notes a discomfort. She cannot really tell if it occurs with exertion.  She is limited in her functionality by knee DJD.  She probably does not achieve 4 METs.  She denies significant dyspnea, orthopnea, PND, edema.  No syncope.    Recent Labs: 07/01/2013: Pro B Natriuretic peptide (BNP) 26.0  10/09/2013: ALT 10; Creatinine 0.75; Hemoglobin 13.7; Potassium 4.0   Wt Readings from Last 3 Encounters:  10/27/13 230 lb (104.327 kg)  07/01/13 245 lb (111.131 kg)  10/27/12 257 lb (116.574 kg)     Past Medical History:  1. Frozen shoulder  2. Obesity  3. HTN: ACEI cough.  4. Diabetes  5. Hyperlipidemia  6. Palpitations: PACs noted on telemetry while in the hospital  7. Nonischemic cardiomyopathy: Echo (3/11): Difficult study, moderate aortic insufficiency noted. Left ventriculogram (3/11): EF 40%, global  hypokinesis. Echo (9/11): EF 55-60%, moderate diastolic dysfunction, mild aortic insufficiency. Echo (8/12): EF 50-55%, mild AI, mild MR. Cardiolite 10/13 with EF 51%.  8. Osteoarthritis  9. CAD (nonobstructive): Left heart cath (3/11): 40% ostial D1, 30% mid CFX. ETT-Cardiolite (10/13) with 2'59" exercise, small apical perfusion defect concerning for attenuation, EF 51% with global HK.  10. Aortic insufficiency: Mild on 9/11 cath.  11. Low back pain/sciatica    Past Medical History  Diagnosis Date  . Frozen shoulder   . Obese   . HTN (hypertension)     ACEI cough  . HLD (hyperlipidemia)   . Palpitation     PACs noted on telemetry while in the hospital  . Nonischemic cardiomyopathy     Ech 3/11 difficult study, moderate aortic insuficiency noted. Left evntriculogram 3/11  . Osteoarthritis   . CAD (coronary artery disease)     nonobstructive let heart cath 3/11: 40% ostial D1, 30% mid CFX  . Aortic insufficiency     mild on 9/11 cath  . CHF (congestive heart failure)   . Anginal pain     "small pain?nerves in chest"started 10/05/13   . Pneumonia     hx  . Diabetes mellitus     no meds  . Anxiety     rx given recent not taken yet  . GERD (gastroesophageal reflux disease)     occ  . Stress incontinence     Current Outpatient Prescriptions  Medication  Sig Dispense Refill  . acetaminophen-codeine (TYLENOL #3) 300-30 MG per tablet Take 1 tablet by mouth every 4 (four) hours as needed for moderate pain.      Marland Kitchen alendronate (FOSAMAX) 70 MG tablet Take 70 mg by mouth every 7 (seven) days. Take with a full glass of water on an empty stomach. Take on mondays      . aspirin 81 MG tablet Take 81 mg by mouth every evening.       . calcium carbonate (OS-CAL) 600 MG TABS Take 600 mg by mouth 2 (two) times daily with a meal.       . carvedilol (COREG) 6.25 MG tablet Take 6.25 mg by mouth 2 (two) times daily.        . Cholecalciferol (VITAMIN D) 1000 UNITS capsule Take 2,000 Units by mouth  daily.       . furosemide (LASIX) 20 MG tablet Take 20 mg by mouth every morning.       Marland Kitchen HYDROcodone-acetaminophen (NORCO/VICODIN) 5-325 MG per tablet Take 1 tablet by mouth every 6 (six) hours as needed for moderate pain.      Marland Kitchen losartan (COZAAR) 25 MG tablet Take 25 mg by mouth every morning.       . potassium chloride SA (KLOR-CON M20) 20 MEQ tablet Take 20 mEq by mouth every morning.       . simvastatin (ZOCOR) 40 MG tablet Take 40 mg by mouth at bedtime.        . [DISCONTINUED] sertraline (ZOLOFT) 100 MG tablet Take 100 mg by mouth daily.         No current facility-administered medications for this visit.    Allergies:   Other; Iodine; Lisinopril; and Penicillins   Social History:  The patient  reports that she quit smoking about 30 years ago. Her smoking use included Cigarettes. She has a 15 pack-year smoking history. She does not have any smokeless tobacco history on file. She reports that she does not drink alcohol or use illicit drugs.   Family History:  The patient's family history includes Diabetes in her brother and sister; Heart attack in her father and sister; Heart disease in her sister; Stroke (age of onset: 23) in her mother.   ROS:  Please see the history of present illness.      All other systems reviewed and negative.   PHYSICAL EXAM: VS:  BP 130/60  Pulse 59  Ht 5' (1.524 m)  Wt 230 lb (104.327 kg)  BMI 44.92 kg/m2 Well nourished, well developed, in no acute distress HEENT: normal Neck: no JVD Cardiac:  normal S1, S2; RRR; no murmur Lungs:  clear to auscultation bilaterally, no wheezing, rhonchi or rales Abd: soft, nontender, no hepatomegaly Ext: no edema Skin: warm and dry Neuro:  CNs 2-12 intact, no focal abnormalities noted  EKG:  Sinus brady, HR 59, LAD, NSSTTW changes     ASSESSMENT AND PLAN:  1. Chest pain, unspecified:  Symptoms somewhat atypical.  She needs clearance for knee replacement surgery.  She is unable to achieve 4 METs.  She has a hx of  CAD and multiple risk factors.  It has been > 1 year since her last stress test.  I will arrange a Lexiscan Myoview. 2. Chronic diastolic heart failure:  Volume stable.  Continue current Rx. 3. CAD:  Continue ASA, statin, beta blocker.  Proceed with myoview as noted.  4. HYPERTENSION, UNSPECIFIED:  Controlled.  5. AORTIC REGURGITATION:  Mild by last echo.  No murmur  on exam.  6. HYPERLIPIDEMIA-MIXED:  Continue statin.  7. Pre-operative cardiovascular examination:  Proceed with stress testing as noted.  Further recommendations to follow. 8. Disposition:  F/u with Dr. Loralie Champagne as planned.    Signed, Richardson Dopp, PA-C  10/27/2013 2:17 PM

## 2013-11-03 ENCOUNTER — Telehealth (HOSPITAL_COMMUNITY): Payer: Self-pay

## 2013-11-03 DIAGNOSIS — F330 Major depressive disorder, recurrent, mild: Secondary | ICD-10-CM | POA: Insufficient documentation

## 2013-11-04 ENCOUNTER — Telehealth (HOSPITAL_COMMUNITY): Payer: Self-pay

## 2013-11-05 ENCOUNTER — Encounter: Payer: Self-pay | Admitting: Physician Assistant

## 2013-11-05 ENCOUNTER — Ambulatory Visit (HOSPITAL_COMMUNITY)
Admission: RE | Admit: 2013-11-05 | Discharge: 2013-11-05 | Disposition: A | Discharge status: Home / Self Care | Payer: Medicare Other | Source: Ambulatory Visit | Attending: Cardiology | Admitting: Cardiology

## 2013-11-05 DIAGNOSIS — I2510 Atherosclerotic heart disease of native coronary artery without angina pectoris: Secondary | ICD-10-CM | POA: Insufficient documentation

## 2013-11-05 DIAGNOSIS — Z01810 Encounter for preprocedural cardiovascular examination: Secondary | ICD-10-CM

## 2013-11-05 MED ORDER — TECHNETIUM TC 99M SESTAMIBI GENERIC - CARDIOLITE
10.0000 | Freq: Once | INTRAVENOUS | Status: AC | PRN
  Administered 2013-11-05: 10 via INTRAVENOUS

## 2013-11-05 MED ORDER — REGADENOSON 0.4 MG/5ML IV SOLN
0.4000 mg | Freq: Once | INTRAVENOUS | Status: AC
  Administered 2013-11-05: 0.4 mg via INTRAVENOUS

## 2013-11-05 MED ORDER — TECHNETIUM TC 99M SESTAMIBI GENERIC - CARDIOLITE
30.0000 | Freq: Once | INTRAVENOUS | Status: AC | PRN
  Administered 2013-11-05: 30 via INTRAVENOUS

## 2013-11-05 NOTE — Procedures (Addendum)
[facility] [address] [place] [address] [phone]  Cardiology Nuclear Med Study  Julie Graves is a 73 y.o. female     MRN : [id number]     DOB: 05-Mar-1941  Procedure Date: 11/05/2013  Nuclear Med Background Indication for Stress Test:  Surgical Clearance History:  CAD;nonischemic cardiomyopathy;Last NUC MPI on 03/2012-nonischemic;EF=51%;aortic insufficiency;CHF Cardiac Risk Factors: Family History - CAD, History of Smoking, Hypertension, Lipids, NIDDM and Obesity  Symptoms:  Chest Pain and Palpitations   Nuclear Pre-Procedure Caffeine/Decaff Intake:  7:00pm NPO After: 5:00am   IV Site: R Forearm  IV 0.9% NS with Angio Cath:  22g  Chest Size (in):  n/a IV Started by: Rolene Course, RN  Height: 5' (1.524 m)  Cup Size: D  BMI:  Body mass index is 44.92 kg/(m^2). Weight:  230 lb (104.327 kg)   Tech Comments:  n/a    Nuclear Med Study 1 or 2 day study: 1 day  Stress Test Type:  [place] Provider:  Loralie Champagne, MD   Resting Radionuclide: Technetium 36m Sestamibi  Resting Radionuclide Dose: 10.5 mCi   Stress Radionuclide:  Technetium 18m Sestamibi  Stress Radionuclide Dose: 30.1 mCi           Stress Protocol Rest HR: 51 Stress HR: 72  Rest BP: 141/67 Stress BP: 141/78  Exercise Time (min): n/a METS: n/a          Dose of Adenosine (mg):  n/a Dose of Lexiscan: 0.4 mg  Dose of Atropine (mg): n/a Dose of Dobutamine: n/a mcg/kg/min (at max HR)  Stress Test Technologist: Mellody Memos, CCT Nuclear Technologist: Otho Perl, CNMT   Rest Procedure:  Myocardial perfusion imaging was performed at rest 45 minutes following the intravenous administration of Technetium 37m Sestamibi. Stress Procedure:  The patient received IV Lexiscan 0.4 mg over 15-seconds.  Technetium 28m Sestamibi injected IV at 30-seconds.  There were no significant changes with Lexiscan.  Quantitative spect images were  obtained after a 45 minute delay.  Transient Ischemic Dilatation (Normal <1.22):  1.01 Lung/Heart Ratio (Normal <0.45):  0.35 QGS EDV:  102 ml QGS ESV:  54 ml LV Ejection Fraction: 46%   Robin Moffitt, CNMT  PHYSICIAN INTERPRETATION  Rest ECG: NSR-LVH , Non-specific ST and T wave changes  Stress ECG: No significant change from baseline ECG  QPS Raw Data Images:  There is a breast shadow that accounts for the anterior attenuation. Stress Images:  There is decreased uptake in the apex. Rest Images:  There is decreased uptake in the apex. Subtraction (SDS):  There is a fixed anteriour defect that is most consistent with breast attenuation, but cannot exclude apical infarct. There is no reversibility to suggest ischemia.  Impression Exercise Capacity:  Lexiscan with no exercise. BP Response:  Normal blood pressure response. Clinical Symptoms:  There is dyspnea. ECG Impression:  No significant ECG changes with Lexiscan. Comparison with Prior Nuclear Study: No images to compare to, however the clinical repeat appears to be similar.  Overall Impression:  Low risk stress nuclear study With mild apical breast attenuation. Cannot exclude small apical infarct.  LV Wall Motion:  Unable to adequately it says the overall cardiac function as the gated images were not adequately established to capture wall motion.   Leonie Man, MD  11/05/2013 6:33 PM

## 2013-11-19 ENCOUNTER — Other Ambulatory Visit (HOSPITAL_COMMUNITY): Payer: Self-pay | Admitting: Orthopaedic Surgery

## 2013-11-25 ENCOUNTER — Encounter: Payer: Self-pay | Admitting: *Deleted

## 2013-11-28 NOTE — Pre-Procedure Instructions (Signed)
[facility] - Preparing for Surgery  Before surgery, you can play an important role.  Because skin is not sterile, your skin needs to be as free of germs as possible.  You can reduce the number of germs on you skin by washing with CHG (chlorahexidine gluconate) soap before surgery.  CHG is an antiseptic cleaner which kills germs and bonds with the skin to continue killing germs even after washing.  Please DO NOT use if you have an allergy to CHG or antibacterial soaps.  If your skin becomes reddened/irritated stop using the CHG and inform your nurse when you arrive at Short Stay.  Do not shave (including legs and underarms) for at least 48 hours prior to the first CHG shower.  You may shave your face.  Please follow these instructions carefully:   1.  Shower with CHG Soap the night before surgery and the morning of Surgery.  2.  If you choose to wash your hair, wash your hair first as usual with your normal shampoo.  3.  After you shampoo, rinse your hair and body thoroughly to remove the shampoo.  4.  Use CHG as you would any other liquid soap.  You can apply CHG directly to the skin and wash gently with scrungie or a clean washcloth.  5.  Apply the CHG Soap to your body ONLY FROM THE NECK DOWN.  Do not use on open wounds or open sores.  Avoid contact with your eyes, ears, mouth and genitals (private parts).  Wash genitals (private parts) with your normal soap.  6.  Wash thoroughly, paying special attention to the area where your surgery will be performed.  7.  Thoroughly rinse your body with warm water from the neck down.  8.  DO NOT shower/wash with your normal soap after using and rinsing off the CHG Soap.  9.  Pat yourself dry with a clean towel.            10.  Wear clean pajamas.            11.  Place clean sheets on your bed the night of your first shower and do not sleep with pets.  Day of Surgery  Do not apply any lotions the morning of surgery.  Please wear clean clothes to the  hospital/surgery center.   

## 2013-11-28 NOTE — Pre-Procedure Instructions (Signed)
Julie Graves  11/28/2013   Your procedure is scheduled on:  June 17  Report to [facility] Admitting at 10:30 AM.  Call this number if you have problems the morning of surgery: [phone]   Remember:   Do not eat food or drink liquids after midnight.   Take these medicines the morning of surgery with A SIP OF WATER: Tylenol #3 OR Hydrocodone (if needed), Xanax (if needed), Carvedilol, Singulair, Zoloft   STOP Vitamin D, Os- Cal, Aspirin June 10   STOP/ Do not take Aspirin, Aleve, Naproxen, Advil, Ibuprofen, Vitamin, Herbs, or Supplements starting June 10   Do not wear jewelry, make-up or nail polish.  Do not wear lotions, powders, or perfumes. You may wear deodorant.  Do not shave 48 hours prior to surgery. Men may shave face and neck.  Do not bring valuables to the hospital.  [facility] is not responsible for any belongings or valuables.               Contacts, dentures or bridgework may not be worn into surgery.  Leave suitcase in the car. After surgery it may be brought to your room.  For patients admitted to the hospital, discharge time is determined by your treatment team.               Special Instructions: See [facility] Preparing For Surgery   Please read over the following fact sheets that you were given: Pain Booklet, Coughing and Deep Breathing, Blood Transfusion Information, Total Joint Packet and Surgical Site Infection Prevention

## 2013-11-30 ENCOUNTER — Encounter (HOSPITAL_COMMUNITY): Payer: Self-pay

## 2013-11-30 ENCOUNTER — Encounter (HOSPITAL_COMMUNITY): Payer: Self-pay | Admitting: Pharmacy Technician

## 2013-11-30 ENCOUNTER — Encounter (HOSPITAL_COMMUNITY)
Admission: RE | Admit: 2013-11-30 | Discharge: 2013-11-30 | Disposition: A | Discharge status: Home / Self Care | Payer: Medicare Other | Source: Ambulatory Visit | Attending: Orthopaedic Surgery | Admitting: Orthopaedic Surgery

## 2013-11-30 DIAGNOSIS — Z01812 Encounter for preprocedural laboratory examination: Secondary | ICD-10-CM | POA: Insufficient documentation

## 2013-11-30 LAB — CBC
HCT: 40.2 % (ref 36.0–46.0)
Hemoglobin: 12.9 g/dL (ref 12.0–15.0)
MCH: 29.8 pg (ref 26.0–34.0)
MCHC: 32.1 g/dL (ref 30.0–36.0)
MCV: 92.8 fL (ref 78.0–100.0)
PLATELETS: 226 10*3/uL (ref 150–400)
RBC: 4.33 MIL/uL (ref 3.87–5.11)
RDW: 13.3 % (ref 11.5–15.5)
WBC: 5.8 10*3/uL (ref 4.0–10.5)

## 2013-11-30 LAB — COMPREHENSIVE METABOLIC PANEL
ALBUMIN: 3.6 g/dL (ref 3.5–5.2)
ALT: 10 U/L (ref 0–35)
AST: 16 U/L (ref 0–37)
Alkaline Phosphatase: 39 U/L (ref 39–117)
BILIRUBIN TOTAL: 0.8 mg/dL (ref 0.3–1.2)
BUN: 15 mg/dL (ref 6–23)
CHLORIDE: 99 meq/L (ref 96–112)
CO2: 28 mEq/L (ref 19–32)
Calcium: 9.5 mg/dL (ref 8.4–10.5)
Creatinine, Ser: 0.66 mg/dL (ref 0.50–1.10)
GFR calc Af Amer: >90 mL/min (ref >90)
GFR calc non Af Amer: 86 mL/min — ABNORMAL LOW (ref >90)
Glucose, Bld: 102 mg/dL — ABNORMAL HIGH (ref 70–99)
Potassium: 4.3 mEq/L (ref 3.7–5.3)
SODIUM: 138 meq/L (ref 137–147)
Total Protein: 6.8 g/dL (ref 6.0–8.3)

## 2013-11-30 LAB — URINALYSIS, ROUTINE W REFLEX MICROSCOPIC
Bilirubin Urine: NEGATIVE
Glucose, UA: NEGATIVE mg/dL
HGB URINE DIPSTICK: NEGATIVE
Ketones, ur: NEGATIVE mg/dL
Leukocytes, UA: NEGATIVE
NITRITE: NEGATIVE
PH: 7 (ref 5.0–8.0)
Protein, ur: NEGATIVE mg/dL
SPECIFIC GRAVITY, URINE: 1.013 (ref 1.005–1.030)
Urobilinogen, UA: 1 mg/dL (ref 0.0–1.0)

## 2013-11-30 LAB — SURGICAL PCR SCREEN
MRSA, PCR: NEGATIVE
Staphylococcus aureus: NEGATIVE

## 2013-11-30 LAB — TYPE AND SCREEN
ABO/RH(D): AB POS
Antibody Screen: NEGATIVE

## 2013-11-30 LAB — PROTIME-INR
INR: 1.04 (ref 0.00–1.49)
Prothrombin Time: 13.4 seconds (ref 11.6–15.2)

## 2013-11-30 LAB — APTT: aPTT: 29 seconds (ref 24–37)

## 2013-12-01 ENCOUNTER — Encounter: Payer: Self-pay | Admitting: Gastroenterology

## 2013-12-07 NOTE — H&P (Signed)
TOTAL KNEE ADMISSION H&P  Patient is being admitted for right total knee arthroplasty.  Subjective:  Chief Complaint:right knee pain.  HPI: Julie Graves, 73 y.o. female, has a history of pain and functional disability in the right knee due to arthritis and has failed non-surgical conservative treatments for greater than 12 weeks to includeNSAID's and/or analgesics, corticosteriod injections and use of assistive devices.  Onset of symptoms was gradual, starting 2 years ago with gradually worsening course since that time. The patient noted no past surgery on the right knee(s).  Patient currently rates pain in the right knee(s) at 7 out of 10 with activity. Patient has night pain, worsening of pain with activity and weight bearing and pain that interferes with activities of daily living.  Patient has evidence of subchondral cysts, periarticular osteophytes and joint space narrowing by imaging studies. There is no active infection.  Patient Active Problem List   Diagnosis Date Noted  . Urge incontinence 04/02/2013  . Spinal stenosis, lumbar region, without neurogenic claudication 10/27/2012  . Spinal stenosis of lumbar region 10/01/2012  . Hyperlipidemia 05/27/2012  . Allergic rhinitis 09/26/2011  . Dizziness 01/23/2011  . Dyspnea 01/23/2011  . Type II diabetes mellitus 06/27/2010  . Morbid obesity 06/27/2010  . Coronary atherosclerosis 06/22/2010  . Hypertension, benign 06/22/2010  . Disorder of bone and cartilage 06/22/2010  . Primary cardiomyopathy 03/23/2010  . HYPERLIPIDEMIA-MIXED 03/07/2010  . HYPERTENSION, UNSPECIFIED 03/07/2010  . CARDIOMEGALY 11/30/2009  . OTHER DYSPNEA AND RESPIRATORY ABNORMALITIES 11/30/2009  . CAD 09/28/2009  . AORTIC REGURGITATION 09/28/2009  . Chronic diastolic heart failure 02/40/9735  . 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 Osteoarthrosis involving multiple sites 10/26/2008   Past Medical History  Diagnosis Date  . Frozen shoulder   . Obese    . HTN (hypertension)     ACEI cough  . HLD (hyperlipidemia)   . Palpitation     PACs noted on telemetry while in the hospital  . Nonischemic cardiomyopathy     Ech 3/11 difficult study, moderate aortic insuficiency noted. Left evntriculogram 3/11  . Osteoarthritis   . CAD (coronary artery disease)     nonobstructive let heart cath 3/11: 40% ostial D1, 30% mid CFX  . Aortic insufficiency     mild on 9/11 cath  . CHF (congestive heart failure)   . Pneumonia     hx  . Diabetes mellitus     no meds  . Anxiety     rx given recent not taken yet  . GERD (gastroesophageal reflux disease)     occ  . Stress incontinence   . Hx of cardiovascular stress test     Lexiscan Myoview (10/2013):  Fixed ant defect most c/w breast attenuation, cannot exclude apical infarct; no ischemia, EF 46%; Low Risk  . Colon polyps 02/01/2009     MULTIPLE FRAGMENTS OF TUBULAR ADENOMAS  . Anginal pain     "small pain?nerves in chest"started 10/05/13   . Anginal pain     cleared by cardiology 3/15    Past Surgical History  Procedure Laterality Date  . Abdominal hysterectomy    . Tubal ligation    . Appendectomy    . Eye surgery    . Bladder neck suspension  85    No prescriptions prior to admission   Allergies  Allergen Reactions  . Iodine Hives and Swelling  . Lisinopril Other (See Comments)    cough  . Shellfish Allergy Swelling    Swelling - throat and lips  .  Tramadol Other (See Comments)    confusion  . Penicillins Hives and Rash    History  Substance Use Topics  . Smoking status: Former Smoker -- 1.00 packs/day for 15 years    Types: Cigarettes    Quit date: 10/10/1983  . Smokeless tobacco: Not on file     Comment: Quit 1970s  . Alcohol Use: No    Family History  Problem Relation Age of Onset  . Stroke Mother 51  . Heart attack Father   . Diabetes Sister   . Heart disease Sister   . Diabetes Brother   . Heart attack Sister      Review of Systems  Musculoskeletal: Positive  for joint pain.       Right knee   All other systems reviewed and are negative.   Objective:  Physical Exam  Constitutional: She is oriented to person, place, and time. She appears well-developed and well-nourished.  HENT:  Head: Normocephalic and atraumatic.  Eyes: EOM are normal. Pupils are equal, round, and reactive to light.  Neck: Normal range of motion.  Cardiovascular: Normal rate, regular rhythm and intact distal pulses.   Respiratory: Effort normal and breath sounds normal.  GI: Soft.  Musculoskeletal:  Crepitus and mild varus deformity of the right knee. No ligamentous instability of the right knee Pronounced limp  Neurological: She is alert and oriented to person, place, and time.  Skin: Skin is warm and dry.  Psychiatric: She has a normal mood and affect.    Vital signs in last 24 hours:    Labs:   Estimated body mass index is 44.92 kg/(m^2) as calculated from the following:   Height as of 11/05/13: 5' (1.524 m).   Weight as of 11/05/13: 104.327 kg (230 lb).   Imaging Review Plain radiographs demonstrate moderate degenerative joint disease of the right knee(s). The overall alignment ismild varus. The bone quality appears to be adequate for age and reported activity level.  Assessment/Plan:  End stage arthritis, right knee   The patient history, physical examination, clinical judgment of the provider and imaging studies are consistent with end stage degenerative joint disease of the right knee(s) and total knee arthroplasty is deemed medically necessary. The treatment options including medical management, injection therapy arthroscopy and arthroplasty were discussed at length. The risks and benefits of total knee arthroplasty were presented and reviewed. The risks due to aseptic loosening, infection, stiffness, patella tracking problems, thromboembolic complications and other imponderables were discussed. The patient acknowledged the explanation, agreed to proceed  with the plan and consent was signed. Patient is being admitted for inpatient treatment for surgery, pain control, PT, OT, prophylactic antibiotics, VTE prophylaxis, progressive ambulation and ADL's and discharge planning. The patient is planning to be discharged home with home health services

## 2013-12-08 MED ORDER — CLINDAMYCIN PHOSPHATE 900 MG/50ML IV SOLN
900.0000 mg | INTRAVENOUS | Status: AC
  Administered 2013-12-09: 900 mg via INTRAVENOUS
  Filled 2013-12-08: qty 50

## 2013-12-09 ENCOUNTER — Inpatient Hospital Stay (HOSPITAL_COMMUNITY): Payer: Medicare Other | Admitting: Anesthesiology

## 2013-12-09 ENCOUNTER — Encounter (HOSPITAL_COMMUNITY): Payer: Medicare Other | Admitting: Anesthesiology

## 2013-12-09 ENCOUNTER — Encounter (HOSPITAL_COMMUNITY)
Admission: RE | Disposition: A | Discharge status: Skilled Nursing Facility | Payer: Self-pay | Source: Ambulatory Visit | Attending: Orthopaedic Surgery

## 2013-12-09 ENCOUNTER — Inpatient Hospital Stay (HOSPITAL_COMMUNITY)
Admission: RE | Admit: 2013-12-09 | Discharge: 2013-12-11 | DRG: 470 | Disposition: A | Discharge status: Skilled Nursing Facility | Payer: Medicare Other | Source: Ambulatory Visit | Attending: Orthopaedic Surgery | Admitting: Orthopaedic Surgery

## 2013-12-09 ENCOUNTER — Encounter (HOSPITAL_COMMUNITY): Payer: Self-pay | Admitting: *Deleted

## 2013-12-09 DIAGNOSIS — E785 Hyperlipidemia, unspecified: Secondary | ICD-10-CM | POA: Diagnosis present

## 2013-12-09 DIAGNOSIS — Z8249 Family history of ischemic heart disease and other diseases of the circulatory system: Secondary | ICD-10-CM

## 2013-12-09 DIAGNOSIS — I2510 Atherosclerotic heart disease of native coronary artery without angina pectoris: Secondary | ICD-10-CM | POA: Diagnosis present

## 2013-12-09 DIAGNOSIS — K219 Gastro-esophageal reflux disease without esophagitis: Secondary | ICD-10-CM | POA: Diagnosis present

## 2013-12-09 DIAGNOSIS — Z833 Family history of diabetes mellitus: Secondary | ICD-10-CM

## 2013-12-09 DIAGNOSIS — I10 Essential (primary) hypertension: Secondary | ICD-10-CM | POA: Diagnosis present

## 2013-12-09 DIAGNOSIS — M1710 Unilateral primary osteoarthritis, unspecified knee: Principal | ICD-10-CM | POA: Diagnosis present

## 2013-12-09 DIAGNOSIS — M48061 Spinal stenosis, lumbar region without neurogenic claudication: Secondary | ICD-10-CM | POA: Diagnosis present

## 2013-12-09 DIAGNOSIS — Z823 Family history of stroke: Secondary | ICD-10-CM

## 2013-12-09 DIAGNOSIS — M1711 Unilateral primary osteoarthritis, right knee: Secondary | ICD-10-CM

## 2013-12-09 DIAGNOSIS — I5032 Chronic diastolic (congestive) heart failure: Secondary | ICD-10-CM | POA: Diagnosis present

## 2013-12-09 DIAGNOSIS — I428 Other cardiomyopathies: Secondary | ICD-10-CM | POA: Diagnosis present

## 2013-12-09 DIAGNOSIS — Z9851 Tubal ligation status: Secondary | ICD-10-CM

## 2013-12-09 DIAGNOSIS — Z6841 Body Mass Index (BMI) 40.0 and over, adult: Secondary | ICD-10-CM

## 2013-12-09 DIAGNOSIS — Z7982 Long term (current) use of aspirin: Secondary | ICD-10-CM

## 2013-12-09 DIAGNOSIS — I509 Heart failure, unspecified: Secondary | ICD-10-CM | POA: Diagnosis present

## 2013-12-09 DIAGNOSIS — Z87891 Personal history of nicotine dependence: Secondary | ICD-10-CM

## 2013-12-09 DIAGNOSIS — E119 Type 2 diabetes mellitus without complications: Secondary | ICD-10-CM | POA: Diagnosis present

## 2013-12-09 DIAGNOSIS — F411 Generalized anxiety disorder: Secondary | ICD-10-CM | POA: Diagnosis present

## 2013-12-09 DIAGNOSIS — Z9089 Acquired absence of other organs: Secondary | ICD-10-CM

## 2013-12-09 DIAGNOSIS — Z79899 Other long term (current) drug therapy: Secondary | ICD-10-CM

## 2013-12-09 HISTORY — PX: KNEE ARTHROPLASTY: SHX992

## 2013-12-09 LAB — GLUCOSE, CAPILLARY: GLUCOSE-CAPILLARY: 114 mg/dL — AB (ref 70–99)

## 2013-12-09 SURGERY — ARTHROPLASTY, KNEE, TOTAL, USING IMAGELESS COMPUTER-ASSISTED NAVIGATION
Anesthesia: Regional | Site: Knee | Laterality: Right

## 2013-12-09 MED ORDER — KETOROLAC TROMETHAMINE 15 MG/ML IJ SOLN
7.5000 mg | Freq: Four times a day (QID) | INTRAMUSCULAR | Status: AC
  Administered 2013-12-09 – 2013-12-10 (×4): 7.5 mg via INTRAVENOUS
  Filled 2013-12-09: qty 1

## 2013-12-09 MED ORDER — BUPIVACAINE HCL (PF) 0.25 % IJ SOLN
INTRAMUSCULAR | Status: AC
Start: 2013-12-09 — End: 2013-12-09
  Filled 2013-12-09: qty 30

## 2013-12-09 MED ORDER — DIPHENHYDRAMINE HCL 12.5 MG/5ML PO ELIX: 12.5000 mg | ORAL_SOLUTION | ORAL | Status: DC | PRN

## 2013-12-09 MED ORDER — GLYCOPYRROLATE 0.2 MG/ML IJ SOLN
INTRAMUSCULAR | Status: AC
  Filled 2013-12-09: qty 1

## 2013-12-09 MED ORDER — SENNOSIDES-DOCUSATE SODIUM 8.6-50 MG PO TABS: 1.0000 | ORAL_TABLET | Freq: Every evening | ORAL | Status: DC | PRN

## 2013-12-09 MED ORDER — PROPOFOL 10 MG/ML IV BOLUS
INTRAVENOUS | Status: AC
Start: 2013-12-09 — End: 2013-12-09
  Filled 2013-12-09: qty 20

## 2013-12-09 MED ORDER — HYDROMORPHONE HCL PF 1 MG/ML IJ SOLN
0.2500 mg | INTRAMUSCULAR | Status: DC | PRN
  Administered 2013-12-09 (×4): 0.5 mg via INTRAVENOUS

## 2013-12-09 MED ORDER — ASPIRIN EC 325 MG PO TBEC: 325.0000 mg | DELAYED_RELEASE_TABLET | Freq: Every day | ORAL | Status: DC

## 2013-12-09 MED ORDER — FENTANYL CITRATE 0.05 MG/ML IJ SOLN: 50.0000 ug | INTRAMUSCULAR | Status: DC | PRN

## 2013-12-09 MED ORDER — LIDOCAINE HCL (CARDIAC) 20 MG/ML IV SOLN
INTRAVENOUS | Status: DC | PRN
  Administered 2013-12-09: 30 mg via INTRAVENOUS

## 2013-12-09 MED ORDER — VITAMIN D 1000 UNITS PO CAPS: 2000.0000 [IU] | ORAL_CAPSULE | Freq: Every day | ORAL | Status: DC

## 2013-12-09 MED ORDER — CARVEDILOL 6.25 MG PO TABS
6.2500 mg | ORAL_TABLET | Freq: Two times a day (BID) | ORAL | Status: DC
  Administered 2013-12-10 – 2013-12-11 (×3): 6.25 mg via ORAL
  Filled 2013-12-09 (×5): qty 1

## 2013-12-09 MED ORDER — MIDAZOLAM HCL 2 MG/2ML IJ SOLN: 2.0000 mg | INTRAMUSCULAR | Status: DC | PRN

## 2013-12-09 MED ORDER — OXYCODONE HCL 5 MG PO TABS
5.0000 mg | ORAL_TABLET | ORAL | Status: DC | PRN
  Administered 2013-12-09 – 2013-12-11 (×11): 10 mg via ORAL
  Filled 2013-12-09 (×10): qty 2

## 2013-12-09 MED ORDER — WHITE PETROLATUM GEL
Status: AC
  Administered 2013-12-09: 1
  Filled 2013-12-09: qty 5

## 2013-12-09 MED ORDER — OXYCODONE HCL 5 MG PO TABS
ORAL_TABLET | ORAL | Status: AC
  Administered 2013-12-09: 10 mg via ORAL
  Filled 2013-12-09: qty 2

## 2013-12-09 MED ORDER — MENTHOL 3 MG MT LOZG: 1.0000 | LOZENGE | OROMUCOSAL | Status: DC | PRN

## 2013-12-09 MED ORDER — SIMVASTATIN 40 MG PO TABS
40.0000 mg | ORAL_TABLET | Freq: Every day | ORAL | Status: DC
  Administered 2013-12-09 – 2013-12-10 (×2): 40 mg via ORAL
  Filled 2013-12-09 (×3): qty 1

## 2013-12-09 MED ORDER — LOSARTAN POTASSIUM 25 MG PO TABS
25.0000 mg | ORAL_TABLET | Freq: Every morning | ORAL | Status: DC
  Administered 2013-12-10 – 2013-12-11 (×2): 25 mg via ORAL
  Filled 2013-12-09 (×2): qty 1

## 2013-12-09 MED ORDER — METHOCARBAMOL 500 MG PO TABS
500.0000 mg | ORAL_TABLET | Freq: Four times a day (QID) | ORAL | Status: DC | PRN
  Administered 2013-12-09 – 2013-12-11 (×5): 500 mg via ORAL
  Filled 2013-12-09 (×4): qty 1

## 2013-12-09 MED ORDER — FENTANYL CITRATE 0.05 MG/ML IJ SOLN: 100.0000 ug | Freq: Once | INTRAMUSCULAR | Status: DC

## 2013-12-09 MED ORDER — ACETAMINOPHEN 325 MG PO TABS
650.0000 mg | ORAL_TABLET | Freq: Four times a day (QID) | ORAL | Status: DC | PRN
  Administered 2013-12-09: 650 mg via ORAL

## 2013-12-09 MED ORDER — METHOCARBAMOL 1000 MG/10ML IJ SOLN
500.0000 mg | Freq: Four times a day (QID) | INTRAVENOUS | Status: DC | PRN
  Filled 2013-12-09: qty 5

## 2013-12-09 MED ORDER — KETOROLAC TROMETHAMINE 15 MG/ML IJ SOLN
INTRAMUSCULAR | Status: AC
  Administered 2013-12-09: 7.5 mg via INTRAVENOUS
  Filled 2013-12-09: qty 1

## 2013-12-09 MED ORDER — KCL IN DEXTROSE-NACL 20-5-0.45 MEQ/L-%-% IV SOLN
INTRAVENOUS | Status: DC
  Administered 2013-12-09: 22:00:00 via INTRAVENOUS
  Filled 2013-12-09 (×5): qty 1000

## 2013-12-09 MED ORDER — ONDANSETRON HCL 4 MG/2ML IJ SOLN: 4.0000 mg | Freq: Once | INTRAMUSCULAR | Status: DC | PRN

## 2013-12-09 MED ORDER — PHENOL 1.4 % MT LIQD: 1.0000 | OROMUCOSAL | Status: DC | PRN

## 2013-12-09 MED ORDER — MEPERIDINE HCL 25 MG/ML IJ SOLN: 6.2500 mg | INTRAMUSCULAR | Status: DC | PRN

## 2013-12-09 MED ORDER — METHOCARBAMOL 500 MG PO TABS
ORAL_TABLET | ORAL | Status: AC
  Administered 2013-12-09: 500 mg via ORAL
  Filled 2013-12-09: qty 1

## 2013-12-09 MED ORDER — CALCIUM CARBONATE 600 MG PO TABS
600.0000 mg | ORAL_TABLET | Freq: Two times a day (BID) | ORAL | Status: DC
  Filled 2013-12-09: qty 1

## 2013-12-09 MED ORDER — DOCUSATE SODIUM 100 MG PO CAPS
100.0000 mg | ORAL_CAPSULE | Freq: Two times a day (BID) | ORAL | Status: DC
  Administered 2013-12-09 – 2013-12-11 (×4): 100 mg via ORAL
  Filled 2013-12-09 (×5): qty 1

## 2013-12-09 MED ORDER — LIDOCAINE HCL (CARDIAC) 20 MG/ML IV SOLN
INTRAVENOUS | Status: AC
  Filled 2013-12-09: qty 5

## 2013-12-09 MED ORDER — MORPHINE SULFATE 2 MG/ML IJ SOLN
1.0000 mg | INTRAMUSCULAR | Status: DC | PRN
  Administered 2013-12-09 (×3): 1 mg via INTRAVENOUS
  Filled 2013-12-09: qty 1

## 2013-12-09 MED ORDER — PROPOFOL 10 MG/ML IV BOLUS
INTRAVENOUS | Status: DC | PRN
  Administered 2013-12-09: 150 mg via INTRAVENOUS

## 2013-12-09 MED ORDER — MIDAZOLAM HCL 2 MG/2ML IJ SOLN
INTRAMUSCULAR | Status: AC
  Administered 2013-12-09: 2 mg
  Filled 2013-12-09: qty 2

## 2013-12-09 MED ORDER — POTASSIUM CHLORIDE CRYS ER 20 MEQ PO TBCR
20.0000 meq | EXTENDED_RELEASE_TABLET | Freq: Every day | ORAL | Status: DC
  Administered 2013-12-10 – 2013-12-11 (×2): 20 meq via ORAL
  Filled 2013-12-09 (×3): qty 1

## 2013-12-09 MED ORDER — ONDANSETRON HCL 4 MG/2ML IJ SOLN: 4.0000 mg | Freq: Four times a day (QID) | INTRAMUSCULAR | Status: DC | PRN

## 2013-12-09 MED ORDER — FENTANYL CITRATE 0.05 MG/ML IJ SOLN
INTRAMUSCULAR | Status: AC
  Filled 2013-12-09: qty 5

## 2013-12-09 MED ORDER — OXYCODONE-ACETAMINOPHEN 5-325 MG PO TABS: 1.0000 | ORAL_TABLET | ORAL | Status: DC | PRN

## 2013-12-09 MED ORDER — METOCLOPRAMIDE HCL 10 MG PO TABS: 5.0000 mg | ORAL_TABLET | Freq: Three times a day (TID) | ORAL | Status: DC | PRN

## 2013-12-09 MED ORDER — LACTATED RINGERS IV SOLN
INTRAVENOUS | Status: DC
  Administered 2013-12-09: 50 mL/h via INTRAVENOUS

## 2013-12-09 MED ORDER — HYDROMORPHONE HCL PF 1 MG/ML IJ SOLN
INTRAMUSCULAR | Status: AC
  Administered 2013-12-09: 0.5 mg via INTRAVENOUS
  Filled 2013-12-09: qty 1

## 2013-12-09 MED ORDER — ACETAMINOPHEN 325 MG PO TABS
ORAL_TABLET | ORAL | Status: AC
  Administered 2013-12-09: 650 mg via ORAL
  Filled 2013-12-09: qty 2

## 2013-12-09 MED ORDER — SERTRALINE HCL 50 MG PO TABS
50.0000 mg | ORAL_TABLET | Freq: Every day | ORAL | Status: DC
  Administered 2013-12-10 – 2013-12-11 (×2): 50 mg via ORAL
  Filled 2013-12-09 (×2): qty 1

## 2013-12-09 MED ORDER — OXYCODONE HCL 5 MG PO TABS: 5.0000 mg | ORAL_TABLET | Freq: Once | ORAL | Status: DC | PRN

## 2013-12-09 MED ORDER — FENTANYL CITRATE 0.05 MG/ML IJ SOLN
INTRAMUSCULAR | Status: DC | PRN
  Administered 2013-12-09: 50 ug via INTRAVENOUS
  Administered 2013-12-09: 150 ug via INTRAVENOUS
  Administered 2013-12-09: 50 ug via INTRAVENOUS

## 2013-12-09 MED ORDER — FENTANYL CITRATE 0.05 MG/ML IJ SOLN
INTRAMUSCULAR | Status: AC
  Administered 2013-12-09: 50 ug
  Filled 2013-12-09: qty 2

## 2013-12-09 MED ORDER — PHENYLEPHRINE HCL 10 MG/ML IJ SOLN
10.0000 mg | INTRAVENOUS | Status: DC | PRN
  Administered 2013-12-09: 20 ug/min via INTRAVENOUS

## 2013-12-09 MED ORDER — ONDANSETRON HCL 4 MG/2ML IJ SOLN
INTRAMUSCULAR | Status: DC | PRN
  Administered 2013-12-09: 4 mg via INTRAVENOUS

## 2013-12-09 MED ORDER — LACTATED RINGERS IV SOLN
INTRAVENOUS | Status: DC | PRN
  Administered 2013-12-09 (×2): via INTRAVENOUS

## 2013-12-09 MED ORDER — ALPRAZOLAM 0.25 MG PO TABS
0.2500 mg | ORAL_TABLET | Freq: Two times a day (BID) | ORAL | Status: DC | PRN
  Administered 2013-12-10: 0.25 mg via ORAL
  Filled 2013-12-09: qty 1

## 2013-12-09 MED ORDER — CALCIUM CARBONATE 1250 (500 CA) MG PO TABS
1.0000 | ORAL_TABLET | Freq: Two times a day (BID) | ORAL | Status: DC
  Administered 2013-12-10 – 2013-12-11 (×3): 500 mg via ORAL
  Filled 2013-12-09 (×5): qty 1

## 2013-12-09 MED ORDER — CHLORHEXIDINE GLUCONATE 4 % EX LIQD: 60.0000 mL | Freq: Once | CUTANEOUS | Status: DC

## 2013-12-09 MED ORDER — FLEET ENEMA 7-19 GM/118ML RE ENEM: 1.0000 | ENEMA | Freq: Once | RECTAL | Status: AC | PRN

## 2013-12-09 MED ORDER — ONDANSETRON HCL 4 MG PO TABS: 4.0000 mg | ORAL_TABLET | Freq: Four times a day (QID) | ORAL | Status: DC | PRN

## 2013-12-09 MED ORDER — VITAMIN D3 25 MCG (1000 UNIT) PO TABS
2000.0000 [IU] | ORAL_TABLET | Freq: Every day | ORAL | Status: DC
  Administered 2013-12-10 – 2013-12-11 (×2): 2000 [IU] via ORAL
  Filled 2013-12-09 (×3): qty 2

## 2013-12-09 MED ORDER — ASPIRIN EC 325 MG PO TBEC
325.0000 mg | DELAYED_RELEASE_TABLET | Freq: Every day | ORAL | Status: DC
  Administered 2013-12-10 – 2013-12-11 (×2): 325 mg via ORAL
  Filled 2013-12-09 (×3): qty 1

## 2013-12-09 MED ORDER — MIDAZOLAM HCL 2 MG/2ML IJ SOLN
1.0000 mg | INTRAMUSCULAR | Status: DC | PRN
Start: 2013-12-09 — End: 2013-12-09

## 2013-12-09 MED ORDER — MORPHINE SULFATE 2 MG/ML IJ SOLN
INTRAMUSCULAR | Status: AC
  Filled 2013-12-09: qty 1

## 2013-12-09 MED ORDER — ROCURONIUM BROMIDE 50 MG/5ML IV SOLN
INTRAVENOUS | Status: AC
  Filled 2013-12-09: qty 1

## 2013-12-09 MED ORDER — ONDANSETRON HCL 4 MG/2ML IJ SOLN
INTRAMUSCULAR | Status: AC
  Filled 2013-12-09: qty 2

## 2013-12-09 MED ORDER — PROPOFOL 10 MG/ML IV BOLUS
INTRAVENOUS | Status: AC
  Filled 2013-12-09: qty 20

## 2013-12-09 MED ORDER — BISACODYL 10 MG RE SUPP: 10.0000 mg | Freq: Every day | RECTAL | Status: DC | PRN

## 2013-12-09 MED ORDER — FUROSEMIDE 20 MG PO TABS
20.0000 mg | ORAL_TABLET | Freq: Every day | ORAL | Status: DC
  Administered 2013-12-11: 20 mg via ORAL
  Filled 2013-12-09 (×2): qty 1

## 2013-12-09 MED ORDER — ACETAMINOPHEN 650 MG RE SUPP: 650.0000 mg | Freq: Four times a day (QID) | RECTAL | Status: DC | PRN

## 2013-12-09 MED ORDER — METOCLOPRAMIDE HCL 5 MG/ML IJ SOLN: 5.0000 mg | Freq: Three times a day (TID) | INTRAMUSCULAR | Status: DC | PRN

## 2013-12-09 MED ORDER — OXYCODONE HCL 5 MG/5ML PO SOLN: 5.0000 mg | Freq: Once | ORAL | Status: DC | PRN

## 2013-12-09 MED ORDER — SODIUM CHLORIDE 0.9 % IR SOLN
Status: DC | PRN
  Administered 2013-12-09: 1000 mL

## 2013-12-09 MED ORDER — GLYCOPYRROLATE 0.2 MG/ML IJ SOLN
INTRAMUSCULAR | Status: DC | PRN
  Administered 2013-12-09: 0.2 mg via INTRAVENOUS

## 2013-12-09 MED ORDER — MIDAZOLAM HCL 5 MG/ML IJ SOLN
2.0000 mg | Freq: Once | INTRAMUSCULAR | Status: DC
Start: 2013-12-09 — End: 2013-12-09

## 2013-12-09 MED ORDER — MIDAZOLAM HCL 2 MG/2ML IJ SOLN
INTRAMUSCULAR | Status: AC
  Filled 2013-12-09: qty 2

## 2013-12-09 MED ORDER — METHOCARBAMOL 500 MG PO TABS: 500.0000 mg | ORAL_TABLET | Freq: Four times a day (QID) | ORAL | Status: DC | PRN

## 2013-12-09 SURGICAL SUPPLY — 71 items
BANDAGE ELASTIC 4 VELCRO ST LF (GAUZE/BANDAGES/DRESSINGS) ×3 IMPLANT
BANDAGE ELASTIC 6 VELCRO ST LF (GAUZE/BANDAGES/DRESSINGS) ×3 IMPLANT
BANDAGE ESMARK 6X9 LF (GAUZE/BANDAGES/DRESSINGS) ×1 IMPLANT
BENZOIN TINCTURE PRP APPL 2/3 (GAUZE/BANDAGES/DRESSINGS) ×3 IMPLANT
BLADE SAGITTAL 25.0X1.19X90 (BLADE) ×2 IMPLANT
BLADE SAGITTAL 25.0X1.19X90MM (BLADE) ×1
BLADE SAW SGTL 13X75X1.27 (BLADE) ×3 IMPLANT
BNDG CMPR MED 10X6 ELC LF (GAUZE/BANDAGES/DRESSINGS) ×1
BNDG ELASTIC 6X10 VLCR STRL LF (GAUZE/BANDAGES/DRESSINGS) ×3 IMPLANT
BNDG ESMARK 6X9 LF (GAUZE/BANDAGES/DRESSINGS) ×3
BOWL SMART MIX CTS (DISPOSABLE) ×3 IMPLANT
CAPT RP KNEE ×3 IMPLANT
CEMENT HV SMART SET (Cement) ×6 IMPLANT
CLOSURE WOUND 1/2 X4 (GAUZE/BANDAGES/DRESSINGS) ×1
COVER BACK TABLE 24X17X13 BIG (DRAPES) IMPLANT
COVER SURGICAL LIGHT HANDLE (MISCELLANEOUS) ×3 IMPLANT
CUFF TOURNIQUET SINGLE 34IN LL (TOURNIQUET CUFF) ×3 IMPLANT
CUFF TOURNIQUET SINGLE 44IN (TOURNIQUET CUFF) IMPLANT
DRAPE ORTHO SPLIT 77X108 STRL (DRAPES) ×6
DRAPE SURG ORHT 6 SPLT 77X108 (DRAPES) ×2 IMPLANT
DRAPE U-SHAPE 47X51 STRL (DRAPES) ×3 IMPLANT
DRSG PAD ABDOMINAL 8X10 ST (GAUZE/BANDAGES/DRESSINGS) ×6 IMPLANT
DURAPREP 26ML APPLICATOR (WOUND CARE) IMPLANT
ELECT REM PT RETURN 9FT ADLT (ELECTROSURGICAL) ×3
ELECTRODE REM PT RTRN 9FT ADLT (ELECTROSURGICAL) ×1 IMPLANT
FACESHIELD WRAPAROUND (MASK) ×3 IMPLANT
GAUZE XEROFORM 5X9 LF (GAUZE/BANDAGES/DRESSINGS) ×3 IMPLANT
GLOVE BIO SURGEON STRL SZ7 (GLOVE) ×6 IMPLANT
GLOVE BIOGEL PI IND STRL 7.0 (GLOVE) ×2 IMPLANT
GLOVE BIOGEL PI IND STRL 7.5 (GLOVE) ×1 IMPLANT
GLOVE BIOGEL PI IND STRL 8 (GLOVE) ×1 IMPLANT
GLOVE BIOGEL PI INDICATOR 7.0 (GLOVE) ×4
GLOVE BIOGEL PI INDICATOR 7.5 (GLOVE) ×2
GLOVE BIOGEL PI INDICATOR 8 (GLOVE) ×2
GLOVE ECLIPSE 7.0 STRL STRAW (GLOVE) ×3 IMPLANT
GLOVE ORTHO TXT STRL SZ7.5 (GLOVE) ×6 IMPLANT
GOWN STRL REUS W/ TWL LRG LVL3 (GOWN DISPOSABLE) ×3 IMPLANT
GOWN STRL REUS W/ TWL XL LVL3 (GOWN DISPOSABLE) ×3 IMPLANT
GOWN STRL REUS W/TWL LRG LVL3 (GOWN DISPOSABLE) ×6
GOWN STRL REUS W/TWL XL LVL3 (GOWN DISPOSABLE) ×6
HANDPIECE INTERPULSE COAX TIP (DISPOSABLE) ×2
IMMOBILIZER KNEE 22 UNIV (SOFTGOODS) ×3 IMPLANT
KIT BASIN OR (CUSTOM PROCEDURE TRAY) ×3 IMPLANT
KIT ROOM TURNOVER OR (KITS) ×3 IMPLANT
MANIFOLD NEPTUNE II (INSTRUMENTS) ×3 IMPLANT
MARKER SPHERE PSV REFLC THRD 5 (MARKER) ×9 IMPLANT
NEEDLE 1/2 CIR MAYO (NEEDLE) ×3 IMPLANT
NEEDLE HYPO 25GX1X1/2 BEV (NEEDLE) ×3 IMPLANT
NS IRRIG 1000ML POUR BTL (IV SOLUTION) ×3 IMPLANT
PACK TOTAL JOINT (CUSTOM PROCEDURE TRAY) ×3 IMPLANT
PAD ABD 8X10 STRL (GAUZE/BANDAGES/DRESSINGS) ×3 IMPLANT
PAD ARMBOARD 7.5X6 YLW CONV (MISCELLANEOUS) ×6 IMPLANT
PAD CAST 4YDX4 CTTN HI CHSV (CAST SUPPLIES) ×1 IMPLANT
PADDING CAST COTTON 4X4 STRL (CAST SUPPLIES) ×2
PADDING CAST COTTON 6X4 STRL (CAST SUPPLIES) ×3 IMPLANT
PIN SCHANZ 4MM 130MM (PIN) ×12 IMPLANT
SET HNDPC FAN SPRY TIP SCT (DISPOSABLE) ×1 IMPLANT
SPONGE GAUZE 4X4 12PLY (GAUZE/BANDAGES/DRESSINGS) ×3 IMPLANT
STAPLER VISISTAT 35W (STAPLE) ×3 IMPLANT
STRIP CLOSURE SKIN 1/2X4 (GAUZE/BANDAGES/DRESSINGS) ×2 IMPLANT
SUCTION FRAZIER TIP 10 FR DISP (SUCTIONS) ×3 IMPLANT
SUT ETHIBOND NAB CT1 #1 30IN (SUTURE) ×6 IMPLANT
SUT VIC AB 2-0 CT1 27 (SUTURE) ×4
SUT VIC AB 2-0 CT1 TAPERPNT 27 (SUTURE) ×2 IMPLANT
SUT VICRYL 4-0 PS2 18IN ABS (SUTURE) ×3 IMPLANT
SUT VICRYL AB 2 0 TIES (SUTURE) ×3 IMPLANT
SYR CONTROL 10ML LL (SYRINGE) ×3 IMPLANT
TOWEL OR 17X24 6PK STRL BLUE (TOWEL DISPOSABLE) ×3 IMPLANT
TOWEL OR 17X26 10 PK STRL BLUE (TOWEL DISPOSABLE) ×3 IMPLANT
TRAY FOLEY CATH 16FRSI W/METER (SET/KITS/TRAYS/PACK) ×3 IMPLANT
WATER STERILE IRR 1000ML POUR (IV SOLUTION) ×9 IMPLANT

## 2013-12-09 NOTE — Transfer of Care (Signed)
Immediate Anesthesia Transfer of Care Note  Patient: Julie Graves  Procedure(s) Performed: Procedure(s): COMPUTER ASSISTED Right TOTAL KNEE ARTHROPLASTY (Right)  Patient Location: PACU  Anesthesia Type:General  Level of Consciousness: awake and sedated  Airway & Oxygen Therapy: Patient connected to face mask oxygen  Post-op Assessment: Report given to PACU RN, Post -op Vital signs reviewed and stable and Patient moving all extremities  Post vital signs: Reviewed and stable  Complications: No apparent anesthesia complications

## 2013-12-09 NOTE — Plan of Care (Signed)
Problem: Consults Goal: Diagnosis- Total Joint Replacement Primary Total Knee Right     

## 2013-12-09 NOTE — Interval H&P Note (Signed)
History and Physical Interval Note:  12/09/2013 12:23 PM  Julie Graves  has presented today for surgery, with the diagnosis of Right Knee Osteoarthritis  The various methods of treatment have been discussed with the patient and family. After consideration of risks, benefits and other options for treatment, the patient has consented to  Procedure(s) with comments: COMPUTER ASSISTED TOTAL KNEE ARTHROPLASTY (Right) - Cemented Right Total Knee Arthroplasty as a surgical intervention .  The patient's history has been reviewed, patient examined, no change in status, stable for surgery.  I have reviewed the patient's chart and labs.  Questions were answered to the patient's satisfaction.     YATES,MARK C

## 2013-12-09 NOTE — Brief Op Note (Cosign Needed)
12/09/2013  3:18 PM  PATIENT:  Julie Graves  73 y.o. female  PRE-OPERATIVE DIAGNOSIS:  Right Knee Osteoarthritis  POST-OPERATIVE DIAGNOSIS:  right total knee arthroplasty  PROCEDURE:  Procedure(s): COMPUTER ASSISTED Right TOTAL KNEE ARTHROPLASTY (Right)  SURGEON:  Surgeon(s) and Role:    * Marybelle Killings, MD - Primary  PHYSICIAN ASSISTANT: Delman Cheadle  ASSISTANTS: none   ANESTHESIA:   general  EBL:  Total I/O In: 1000 [I.V.:1000] Out: 100 [Urine:100]  BLOOD ADMINISTERED:none  DRAINS: none   LOCAL MEDICATIONS USED:  NONE  SPECIMEN:  No Specimen  DISPOSITION OF SPECIMEN:  N/A  COUNTS:  YES  TOURNIQUET:   Total Tourniquet Time Documented: Thigh (Right) - -37371 minutes Total: Thigh (Right) - -37371 minutes   DICTATION: .Note written in EPIC  PLAN OF CARE: Admit to inpatient   PATIENT DISPOSITION:  PACU - hemodynamically stable.   Delay start of Pharmacological VTE agent (>24hrs) due to surgical blood loss or risk of bleeding: no

## 2013-12-09 NOTE — Anesthesia Procedure Notes (Addendum)
Procedure Name: LMA Insertion Date/Time: 12/09/2013 12:47 PM Performed by: Izora Gala Pre-anesthesia Checklist: Patient identified, Emergency Drugs available, Suction available and Patient being monitored Patient Re-evaluated:Patient Re-evaluated prior to inductionOxygen Delivery Method: Circle system utilized Preoxygenation: Pre-oxygenation with 100% oxygen Intubation Type: IV induction Ventilation: Mask ventilation without difficulty LMA: LMA inserted LMA Size: 4.0 Number of attempts: 1    Anesthesia Regional Block:  Femoral nerve block  Pre-Anesthetic Checklist: ,, timeout performed, Correct Patient, Correct Site, Correct Laterality, Correct Procedure, Correct Position, site marked, Risks and benefits discussed,  Surgical consent,  Pre-op evaluation,  At surgeon's request and post-op pain management  Laterality: Right  Prep: chloraprep       Needles:  Injection technique: Single-shot  Needle Type: Echogenic Stimulator Needle     Needle Length: 9cm 9 cm Needle Gauge: 21 and 21 G    Additional Needles:  Procedures: ultrasound guided (picture in chart) and nerve stimulator Femoral nerve block  Nerve Stimulator or Paresthesia:  Response: 0.4 mA,   Additional Responses:   Narrative:  Start time: 12/09/2013 1:10 PM End time: 12/09/2013 12:20 PM Injection made incrementally with aspirations every 5 mL.  Performed by: Personally  Anesthesiologist: Lillia Abed MD  Additional Notes: Monitors applied. Patient sedated. Sterile prep and drape,hand hygiene and sterile gloves were used. Relevant anatomy identified.Needle position confirmed.Local anesthetic injected incrementally after negative aspiration. Local anesthetic spread visualized around nerve(s). Vascular puncture avoided. No complications. Image printed for medical record.The patient tolerated the procedure well.

## 2013-12-09 NOTE — Op Note (Signed)
[name], Julie Graves            ACCOUNT NO.:  [id number]  MEDICAL RECORD NO.:  [id number]  LOCATION:  5N32C                        FACILITY:  [facility]  PHYSICIAN:  Mark C. Lorin Mercy, M.D.    DATE OF BIRTH:  09-22-1940  DATE OF PROCEDURE:  12/09/2013 DATE OF DISCHARGE:                              OPERATIVE REPORT   PREOPERATIVE DIAGNOSIS:  Right knee osteoarthritis.  POSTOPERATIVE DIAGNOSIS:  Right knee osteoarthritis.  PROCEDURE: 1. Right cemented total knee arthroplasty, DePuy LCS rotating     Platform. ( computer assisted) 2. Cemented femur with lugs, 10 mm rotating platform. 3. Tibial plateau, 32 mm 3 peg, poly.  SURGEON:  Mark C. Lorin Mercy, M.D.  ASSISTANT:  Epimenio Foot, PA-C, medically necessary and present for the entire procedure.  ANESTHESIA:  General with preoperative block.  TOURNIQUET TIME:  1 hour and 10 minutes.  COMPLICATIONS:  None.  PROCEDURE:  After induction of general anesthesia, the patient had morbid obesity, short leg, proximal thigh tourniquet was applied.  Leg was prepped with DuraPrep, usual extremity sheets, drapes, impervious stockinette, Coban was applied.  Time-out procedure was completed. Clindamycin 900 mg were given.  The leg was wrapped in Esmarch, tourniquet inflated.  Midline incision was made.  Superficial retinaculum was developed.  Medial parapatellar incision was made splitting the quad tendon between the medial one-third and lateral two- thirds.  The patient had thick layer of adipose tissue over the quad tendon.  It was difficult to evert the patella and patella was prepared first taking 10 mm off both bone size 32 mm and 3 holes drilled and prepared.  Stab incision was made in mid tibia and bicortical pins were made for computer navigation attachment with the laser balls and computer model was made using reference points on the tibia and femur. 8 mm were taken off the distal femur, 9 off the tibia.  Chamfer cuts were made.  All  cuts were verified.  Box cut was not made in the femur until after the tibia AP cut had been prepared.  Cuts were verified. There was 1 degree of varus on the tibia.  After box cuts, trials were inserted.  Knee reached full extension.  It was in 1 degree of varus. Collateral ligaments were balanced and gaps in flexion/extension both measured 22.5 mm.  Pulse lavage preparation, cementing of the tibia followed by femur, insertion of the permanent rotating platform after all excessive cement had been removed, and patellar component held with self-retaining clamp.  Cement was hardened at 15 minutes.  Once confirmation of good position and full extension, 1 degree varus was confirmed, the 2 pins inside the incision of the femur and 2 stab incision and tibia pins were removed.  Tourniquet was deflated after 15 minutes, the cement was hard, hemostasis was obtained.  Stab incision was made.  Tibia closed with 4-0 Vicryl subcuticular.  Deep layer closed with nonabsorbable 0, 2-0 Vicryl subcutaneous tissue, superficial retinaculum subcuticular skin closure. Tincture of benzoin, Steri-Strips, postop dressing, knee immobilizer, and transferred to recovery room in stable condition.  Instrument count and needle count was correct.     Mark C. Lorin Mercy, M.D.     MCY/MEDQ  D:  12/09/2013  T:  12/09/2013  Job:  [id number]

## 2013-12-09 NOTE — Progress Notes (Signed)
Orthopedic Tech Progress Note Patient Details:  Julie Graves [birth year] [id number]  CPM Right Knee CPM Right Knee: On Right Knee Flexion (Degrees): 60 Right Knee Extension (Degrees): 0 Additional Comments: Trapeze bar and foot roll   Cammer, Theodoro Parma 12/09/2013, 4:22 PM

## 2013-12-09 NOTE — Anesthesia Preprocedure Evaluation (Addendum)
Anesthesia Evaluation  Patient identified by MRN, date of birth, ID band Patient awake    Reviewed: Allergy & Precautions, H&P , NPO status , Patient's Chart, lab work & pertinent test results  Airway Mallampati: I TM Distance: >3 FB Neck ROM: Full    Dental  (+) Teeth Intact, Dental Advisory Given   Pulmonary neg pulmonary ROS, former smoker,    Pulmonary exam normal       Cardiovascular hypertension, Pt. on medications + CAD and +CHF Rhythm:Regular  Non-obstructive CAD with Ej Fx of 46   Neuro/Psych    GI/Hepatic GERD-  Medicated and Controlled,  Endo/Other  diabetes, Well Controlled, Type 2Does not check glucose daily and takes no meds.  Renal/GU      Musculoskeletal   Abdominal Normal abdominal exam  (+)   Peds  Hematology   Anesthesia Other Findings   Reproductive/Obstetrics                         Anesthesia Physical Anesthesia Plan  ASA: III  Anesthesia Plan: General   Post-op Pain Management: MAC Combined w/ Regional for Post-op pain   Induction: Intravenous  Airway Management Planned: LMA  Additional Equipment:   Intra-op Plan:   Post-operative Plan: Extubation in OR  Informed Consent: I have reviewed the patients History and Physical, chart, labs and discussed the procedure including the risks, benefits and alternatives for the proposed anesthesia with the patient or authorized representative who has indicated his/her understanding and acceptance.     Plan Discussed with: CRNA and Surgeon  Anesthesia Plan Comments:        Anesthesia Quick Evaluation

## 2013-12-09 NOTE — Discharge Instructions (Signed)
Keep knee incision dry for 5 days post op then may wet while bathing. Therapy daily and  goal full extension and greater than 90 degrees flexion. Call if fever or chills or increased drainage. Go to ER if acutely short of breath or call for ambulance. Return for follow up in 2 weeks. May full weight bear on the surgical leg unless told otherwise. Use knee immobilizer until able to straight leg raise off bed with knee stable. In house walking for first 2 weeks.

## 2013-12-10 ENCOUNTER — Encounter (HOSPITAL_COMMUNITY): Payer: Self-pay

## 2013-12-10 LAB — BASIC METABOLIC PANEL
BUN: 13 mg/dL (ref 6–23)
CO2: 29 mEq/L (ref 19–32)
Calcium: 9 mg/dL (ref 8.4–10.5)
Chloride: 100 mEq/L (ref 96–112)
Creatinine, Ser: 0.62 mg/dL (ref 0.50–1.10)
GFR, EST NON AFRICAN AMERICAN: 88 mL/min — AB (ref >90)
Glucose, Bld: 120 mg/dL — ABNORMAL HIGH (ref 70–99)
POTASSIUM: 4.7 meq/L (ref 3.7–5.3)
SODIUM: 138 meq/L (ref 137–147)

## 2013-12-10 LAB — CBC
HCT: 33.9 % — ABNORMAL LOW (ref 36.0–46.0)
Hemoglobin: 10.7 g/dL — ABNORMAL LOW (ref 12.0–15.0)
MCH: 29.4 pg (ref 26.0–34.0)
MCHC: 31.6 g/dL (ref 30.0–36.0)
MCV: 93.1 fL (ref 78.0–100.0)
PLATELETS: 206 10*3/uL (ref 150–400)
RBC: 3.64 MIL/uL — ABNORMAL LOW (ref 3.87–5.11)
RDW: 13.6 % (ref 11.5–15.5)
WBC: 8.5 10*3/uL (ref 4.0–10.5)

## 2013-12-10 MED ORDER — KETOROLAC TROMETHAMINE 15 MG/ML IJ SOLN
INTRAMUSCULAR | Status: AC
  Filled 2013-12-10: qty 1

## 2013-12-10 NOTE — Progress Notes (Signed)
Utilization review completed.  

## 2013-12-10 NOTE — Evaluation (Signed)
Physical Therapy Evaluation Patient Details Name: Julie Graves MRN: [id number] DOB: 10-31-1940 Today's Date: 12/10/2013   History of Present Illness  pt presents with R TKA.    Clinical Impression  Pt agreeable to mobility, but painful in R knee.  Pt indicates plan for D/C to SNF to maximize independence and decrease burden of care prior to returning to home with husband.      Follow Up Recommendations SNF    Equipment Recommendations  None recommended by PT    Recommendations for Other Services       Precautions / Restrictions Precautions Precautions: Knee;Fall Precaution Booklet Issued: Yes (comment) Required Braces or Orthoses: Knee Immobilizer - Right Knee Immobilizer - Right: Discontinue once straight leg raise with < 10 degree lag Restrictions Weight Bearing Restrictions: Yes RLE Weight Bearing: Weight bearing as tolerated      Mobility  Bed Mobility Overal bed mobility: Needs Assistance Bed Mobility: Supine to Sit     Supine to sit: Min assist;HOB elevated     General bed mobility comments: HOB elevated, only A needed for R LE.    Transfers Overall transfer level: Needs assistance Equipment used: Rolling walker (2 wheeled) Transfers: Sit to/from [facility] Sit to Stand: Min assist Stand pivot transfers: Min assist       General transfer comment: cues for UE use and positioning of LEs.  cues to control descent.    Ambulation/Gait                Stairs            Wheelchair Mobility    Modified Rankin (Stroke Patients Only)       Balance Overall balance assessment: Needs assistance Sitting-balance support: Bilateral upper extremity supported;Feet supported Sitting balance-Leahy Scale: Poor     Standing balance support: Bilateral upper extremity supported Standing balance-Leahy Scale: Poor                               Pertinent Vitals/Pain 7/10 during mobility.  Premedicated.      Home  Living Family/patient expects to be discharged to:: Skilled nursing facility                      Prior Function Level of Independence: Independent               Hand Dominance        Extremity/Trunk Assessment   Upper Extremity Assessment: Defer to OT evaluation           Lower Extremity Assessment: RLE deficits/detail RLE Deficits / Details: ROM and strength limited by post-op pain.  AAROM ~5-60    Cervical / Trunk Assessment: Normal  Communication   Communication: No difficulties  Cognition Arousal/Alertness: Awake/alert Behavior During Therapy: WFL for tasks assessed/performed Overall Cognitive Status: Within Functional Limits for tasks assessed                      General Comments      Exercises Total Joint Exercises Ankle Circles/Pumps: AROM;Both;10 reps Quad Sets: AROM;Both;10 reps      Assessment/Plan    PT Assessment Patient needs continued PT services  PT Diagnosis Abnormality of gait;Acute pain   PT Problem List Decreased strength;Decreased range of motion;Decreased activity tolerance;Decreased balance;Decreased mobility;Decreased knowledge of use of DME;Pain  PT Treatment Interventions DME instruction;Gait training;Stair training;Functional mobility training;Therapeutic activities;Therapeutic exercise;Balance training;Patient/family education   PT Goals (Current  goals can be found in the Care Plan section) Acute Rehab PT Goals Patient Stated Goal: Walk PT Goal Formulation: With patient Time For Goal Achievement: 12/17/13 Potential to Achieve Goals: Good    Frequency 7X/week   Barriers to discharge        Co-evaluation               End of Session Equipment Utilized During Treatment: Gait belt;Right knee immobilizer Activity Tolerance: Patient tolerated treatment well Patient left: in chair;with call bell/phone within reach Nurse Communication: Mobility status         Time: 6812-7517 PT Time Calculation  (min): 26 min   Charges:   PT Evaluation $Initial PT Evaluation Tier I: 1 Procedure PT Treatments $Therapeutic Activity: 8-22 mins   PT G CodesCatarina Hartshorn, [place] [phone] 12/10/2013, 12:47 PM

## 2013-12-10 NOTE — Progress Notes (Signed)
Orthopedic Tech Progress Note Patient Details:  Julie Graves 30-Nov-1940 [id number] On cpm at 6:20 pm RLE 0-60 Patient ID: Julie Graves, female   DOB: September 17, 1940, 73 y.o.   MRN: [id number]   [name] 12/10/2013, 6:26 PM

## 2013-12-10 NOTE — Progress Notes (Signed)
Clinical Social Work Department BRIEF PSYCHOSOCIAL ASSESSMENT 12/10/2013  Patient:  Julie Graves, Julie Graves     Account Number:  [id number]     Admit date:  12/09/2013  Clinical Social Worker:  Adair Laundry  Date/Time:  12/10/2013 10:00 AM  Referred by:  Physician  Date Referred:  12/10/2013 Referred for  SNF Placement   Other Referral:   Interview type:  Patient Other interview type:    PSYCHOSOCIAL DATA Living Status:  HUSBAND Admitted from facility:   Level of care:   Primary support name:  Francy Mcilvaine [phone] Primary support relationship to patient:  CHILD, ADULT Degree of support available:   Pt has good support system    CURRENT CONCERNS Current Concerns  Post-Acute Placement   Other Concerns:    SOCIAL WORK ASSESSMENT / PLAN CSW aware that pt is expecting to dc to SNF. CSW visited pt room and spoke with pt about [facility]. Pt informed CSW she has not been to rehab before and was nervous with the idea at first. Pt informed CSW that after speaking with family and having time she is more comfortable with having to go to rehab. Pt informed CSW she is familiar with [facility] since it is close to her home and she would like to dc there. CSW explained SNF referal process. Pt agreed to referal being sent to all [facility] as a back up to desired facility. Pt does have concerns about payment and CSW spoke briefly about insurance coverage but also informed pt that facility would be able to provide more details on coverage. Pt aware that should [facility] be the plan she will need a family member to complete paperwork at the facility tomorrow. Pt informed CSW this would likely be her daughter Joelene Millin.    CSW called [facility] and informed of pt preference. They will review clinicals and respond.   Assessment/plan status:  Psychosocial Support/Ongoing Assessment of Needs Other assessment/ plan:   Information/referral to community resources:   SNF list to be  provided with bed offers    PATIENT'S/FAMILY'S RESPONSE TO PLAN OF CARE: Pt is agreeable to ST rehab at [facility] [place]

## 2013-12-10 NOTE — Progress Notes (Signed)
OT Cancellation Note  Patient Details Name: Julie Graves MRN: [id number] DOB: 1941/06/03   Cancelled Treatment:    Reason Eval/Treat Not Completed: Other (comment) Pt 's current D/C plan is SNF. No apparent immediate acute care OT needs, therefore will defer OT to SNF. If OT eval is needed please call Acute Rehab Dept. at [phone] or text page OT at [phone].    Almon Register [phone] 12/10/2013, 1:53 PM

## 2013-12-10 NOTE — Progress Notes (Addendum)
Clinical Social Work Department CLINICAL SOCIAL WORK PLACEMENT NOTE 12/10/2013  Patient:  Julie Graves, Julie Graves  Account Number:  [id number] Admit date:  12/09/2013  Clinical Social Worker:  Berton Mount, Latanya Presser  Date/time:  12/10/2013 10:20 AM  Clinical Social Work is seeking post-discharge placement for this patient at the following level of care:   SKILLED NURSING   (*CSW will update this form in Epic as items are completed)   12/10/2013  Patient/family provided with [facility] Department of Clinical Social Work's list of facilities offering this level of care within the geographic area requested by the patient (or if unable, by the patient's family).  12/10/2013  Patient/family informed of their freedom to choose among providers that offer the needed level of care, that participate in Medicare, Medicaid or managed care program needed by the patient, have an available bed and are willing to accept the patient.  12/10/2013  Patient/family informed of [facility]' ownership interest in [facility], as well as of the fact that they are under no obligation to receive care at this facility.  PASARR submitted to EDS on 12/10/2013 PASARR number received on 12/10/2013  FL2 transmitted to all facilities in geographic area requested by pt/family on  12/10/2013 FL2 transmitted to all facilities within larger geographic area on   Patient informed that his/her managed care company has contracts with or will negotiate with  certain facilities, including the following:     Patient/family informed of bed offers received: 12/10/2013  Patient chooses bed at [facility] Physician recommends and patient chooses bed at    Patient to be transferred to  on   Patient to be transferred to facility by  Patient and family notified of transfer on  Name of family member notified:    The following physician request were entered in Epic: Physician Request  Please sign FL2.    Additional  CommentsBerton Mount, [place]

## 2013-12-10 NOTE — Anesthesia Postprocedure Evaluation (Signed)
Anesthesia Post Note  Patient: Julie Graves  Procedure(s) Performed: Procedure(s) (LRB): COMPUTER ASSISTED Right TOTAL KNEE ARTHROPLASTY (Right)  Anesthesia type: general  Patient location: PACU  Post pain: Pain level controlled  Post assessment: Patient's Cardiovascular Status Stable  Last Vitals:  Filed Vitals:   12/10/13 0500  BP: 125/47  Pulse: 61  Temp: 36.5 C  Resp: 16    Post vital signs: Reviewed and stable  Level of consciousness: sedated  Complications: No apparent anesthesia complications

## 2013-12-10 NOTE — Care Management Note (Signed)
CARE MANAGEMENT NOTE 12/10/2013  Patient:  Julie Graves, Julie Graves   Account Number:  [id number]  Date Initiated:  12/10/2013  Documentation initiated by:  Ricki Miller  Subjective/Objective Assessment:   73 yr old female s/p right total knee arthroplasty.     Action/Plan:   Patient is for shortterm rehab at SNF, wants [facility]. Social worker is aware. Will discharge when medically ready.   Anticipated DC Date:  12/12/2013   Anticipated DC Plan:  SKILLED NURSING FACILITY  In-house referral  Clinical Social Worker      DC Planning Services  CM consult      [facility] Choice  NA   Choice offered to / List presented to:             Status of service:  Completed, signed off Medicare Important Message given?  NA - LOS <3 / Initial given by admissions (If response is "NO", the following Medicare IM given date fields will be blank) Date Medicare IM given:   Date Additional Medicare IM given:    Discharge Disposition:  [facility]  Per UR Regulation:  Reviewed for med. necessity/level of care/duration of stay

## 2013-12-10 NOTE — Progress Notes (Signed)
Subjective: 1 Day Post-Op Procedure(s) (LRB): COMPUTER ASSISTED Right TOTAL KNEE ARTHROPLASTY (Right) Patient reports pain as moderate.    Objective: Vital signs in last 24 hours: Temp:  [97.6 F (36.4 C)-98.3 F (36.8 C)] 97.7 F (36.5 C) (06/18 0500) Pulse Rate:  [50-71] 61 (06/18 0500) Resp:  [11-21] 16 (06/18 0500) BP: (112-160)/(42-70) 125/47 mmHg (06/18 0500) SpO2:  [95 %-100 %] 99 % (06/18 0500) Weight:  [102.513 kg (226 lb)] 102.513 kg (226 lb) (06/18 0500)  Intake/Output from previous day: 06/17 0701 - 06/18 0700 In: 2440 [P.O.:540; I.V.:1900] Out: 700 [Urine:700] Intake/Output this shift:     Recent Labs  12/10/13 0525  HGB 10.7*    Recent Labs  12/10/13 0525  WBC 8.5  RBC 3.64*  HCT 33.9*  PLT 206    Recent Labs  12/10/13 0525  NA 138  K 4.7  CL 100  CO2 29  BUN 13  CREATININE 0.62  GLUCOSE 120*  CALCIUM 9.0   No results found for this basename: LABPT, INR,  in the last 72 hours  Neurologically intact  Assessment/Plan: 1 Day Post-Op Procedure(s) (LRB): COMPUTER ASSISTED Right TOTAL KNEE ARTHROPLASTY (Right) Up with therapy  Will need SNF . Will try to find FL 2 to sign  YATES,MARK C 12/10/2013, 8:01 AM

## 2013-12-11 LAB — CBC
HCT: 32.4 % — ABNORMAL LOW (ref 36.0–46.0)
HEMOGLOBIN: 10.6 g/dL — AB (ref 12.0–15.0)
MCH: 30.4 pg (ref 26.0–34.0)
MCHC: 32.7 g/dL (ref 30.0–36.0)
MCV: 92.8 fL (ref 78.0–100.0)
Platelets: 221 10*3/uL (ref 150–400)
RBC: 3.49 MIL/uL — AB (ref 3.87–5.11)
RDW: 13.5 % (ref 11.5–15.5)
WBC: 10.1 10*3/uL (ref 4.0–10.5)

## 2013-12-11 NOTE — Progress Notes (Signed)
Patient being discharged to [facility] facility post right total knee replacement 6/17. She is being transported via non-emergent transport. Report called to [facility].

## 2013-12-11 NOTE — Progress Notes (Signed)
Physical Therapy Treatment Patient Details Name: Julie Graves MRN: [id number] DOB: 1941/03/29 Today's Date: 12/11/2013    History of Present Illness pt presents with R TKA.      PT Comments    Pt moving better today, though continues to be limited by fatigue.  Pt notes anticipating D/C to SNF today.  Will continue to follow.    Follow Up Recommendations  SNF     Equipment Recommendations  None recommended by PT    Recommendations for Other Services       Precautions / Restrictions Precautions Precautions: Knee;Fall Precaution Booklet Issued: Yes (comment) Required Braces or Orthoses: Knee Immobilizer - Right Knee Immobilizer - Right: Discontinue once straight leg raise with < 10 degree lag Restrictions Weight Bearing Restrictions: Yes RLE Weight Bearing: Weight bearing as tolerated    Mobility  Bed Mobility                  Transfers Overall transfer level: Needs assistance Equipment used: Rolling walker (2 wheeled) Transfers: Sit to/from Stand Sit to Stand: Min assist         General transfer comment: cues for UE use and positioning of LEs.  cues to control descent.    Ambulation/Gait Ambulation/Gait assistance: Min guard Ambulation Distance (Feet): 75 Feet (and 100) Assistive device: Rolling walker (2 wheeled) Gait Pattern/deviations: Step-to pattern;Decreased step length - left;Decreased stance time - right     General Gait Details: pt moves slowly and fatigues easily.  cues for positioning within RW, upright posture, and hand placement on RW.     Stairs            Wheelchair Mobility    Modified Rankin (Stroke Patients Only)       Balance                                    Cognition Arousal/Alertness: Awake/alert Behavior During Therapy: WFL for tasks assessed/performed Overall Cognitive Status: Within Functional Limits for tasks assessed                      Exercises Total Joint Exercises Ankle  Circles/Pumps: AROM;Both;10 reps Quad Sets: AROM;Both;10 reps Long Arc Quad: AAROM;Right;10 reps Knee Flexion: AAROM;Right;10 reps Goniometric ROM: ~5 - 70    General Comments        Pertinent Vitals/Pain 6/10 during ROM.  Premedicated.      Home Living                      Prior Function            PT Goals (current goals can now be found in the care plan section) Acute Rehab PT Goals Patient Stated Goal: Walk PT Goal Formulation: With patient Time For Goal Achievement: 12/17/13 Potential to Achieve Goals: Good Progress towards PT goals: Progressing toward goals    Frequency  7X/week    PT Plan Current plan remains appropriate    Co-evaluation             End of Session Equipment Utilized During Treatment: Gait belt;Right knee immobilizer Activity Tolerance: Patient tolerated treatment well Patient left: in chair;with call bell/phone within reach     Time: 6270-3500 PT Time Calculation (min): 33 min  Charges:  $Gait Training: 8-22 mins $Therapeutic Exercise: 8-22 mins  G CodesCatarina Hartshorn, [place] 12/11/2013, 2:55 PM

## 2013-12-11 NOTE — Progress Notes (Signed)
Pt has bed available at [facility] if ready for dc today.  [place], [place]

## 2013-12-11 NOTE — Discharge Summary (Signed)
Physician Discharge Summary  Patient ID: Julie Graves MRN: [id number] DOB/AGE: 73-19-1942 73 y.o.  Admit date: 12/09/2013 Discharge date: 12/11/2013  Admission Diagnoses:RIGHT KNEE OA  Discharge Diagnoses: SAME Principal Problem:   Osteoarthritis of right knee   Discharged Condition: [place] [facility] Course: PATIENT UNDERWENT RIGHT TKA COMPUTER ASSISTED WITH GOOD POST OP POSITION AND ALLIGNMENT. USE OF IV AND PO PAIN MEDS, PHYSICAL THERAPY, CPM ORDERED.   Consults: None  Significant Diagnostic Studies:   Treatments: surgery: AS ABOVE.   Discharge Exam: Blood pressure 111/45, pulse 73, temperature 98.4 F (36.9 C), temperature source Oral, resp. rate 18, height 5' (1.524 m), weight 102.513 kg (226 lb), SpO2 99.00%.  POST OP KNEE INCISION DRY. MILD SWELLING. ON ASPIRIN AND SCD'S FOR PROPHYLAXIS FOR DVT PER PROTOCOL.  Disposition: 01-Home or Self Care  Discharge Instructions   Full weight bearing    Complete by:  As directed   Laterality:  right  Extremity:  Lower            Medication List         alendronate 70 MG tablet  Commonly known as:  FOSAMAX  Take 70 mg by mouth every 7 (seven) days. Take with a full glass of water on an empty stomach. Take on mondays     aspirin 81 MG tablet  Take 81 mg by mouth every evening.     aspirin EC 325 MG tablet  Take 1 tablet (325 mg total) by mouth daily.     calcium carbonate 600 MG Tabs tablet  Commonly known as:  OS-CAL  Take 600 mg by mouth 2 (two) times daily with a meal.     COREG 6.25 MG tablet  Generic drug:  carvedilol  Take 6.25 mg by mouth 2 (two) times daily.     furosemide 20 MG tablet  Commonly known as:  LASIX  Take 20 mg by mouth every morning.     KLOR-CON M20 20 MEQ tablet  Generic drug:  potassium chloride SA  Take 20 mEq by mouth every morning.     losartan 25 MG tablet  Commonly known as:  COZAAR  Take 25 mg by mouth every morning.     methocarbamol 500 MG tablet  Commonly known as:   ROBAXIN  Take 1 tablet (500 mg total) by mouth every 6 (six) hours as needed for muscle spasms (spasm).     oxyCODONE-acetaminophen 5-325 MG per tablet  Commonly known as:  ROXICET  Take 1-2 tablets by mouth every 4 (four) hours as needed.     simvastatin 40 MG tablet  Commonly known as:  ZOCOR  Take 40 mg by mouth at bedtime.     Vitamin D 1000 UNITS capsule  Take 2,000 Units by mouth daily.     XANAX 0.25 MG tablet  Generic drug:  ALPRAZolam  Take 0.25 mg by mouth 2 (two) times daily as needed for anxiety. Take 1-2 tablets twice a day as needed for anxiety     ZOLOFT 25 MG tablet  Generic drug:  sertraline  Take 50 mg by mouth daily. Take 1 tab daily for 2 weeks, then increase to 2 daily           Follow-up Information   Follow up with YATES,MARK C, MD. Schedule an appointment as soon as possible for a visit in 2 weeks.   Specialty:  Orthopedic Surgery   Contact information:   [address] [place] [address] [phone]  SignedMarybelle Killings 12/11/2013, 12:26 PM

## 2013-12-13 ENCOUNTER — Encounter (HOSPITAL_COMMUNITY): Payer: Self-pay | Admitting: Orthopaedic Surgery

## 2013-12-23 NOTE — Telephone Encounter (Signed)
Encounter complete. 

## 2013-12-24 NOTE — Telephone Encounter (Signed)
Encounter complete. 

## 2013-12-31 ENCOUNTER — Encounter (HOSPITAL_COMMUNITY): Payer: Self-pay | Admitting: Orthopaedic Surgery

## 2013-12-31 NOTE — OR Nursing (Signed)
Late entry on 12-31-2013 by Etheleen Mayhew, RN to add surgery end time.

## 2014-01-08 NOTE — OR Nursing (Signed)
Late entry for type, subtype and infection under procedural recordings 

## 2014-07-09 ENCOUNTER — Encounter: Payer: Self-pay | Admitting: Cardiology

## 2014-07-09 ENCOUNTER — Ambulatory Visit (INDEPENDENT_AMBULATORY_CARE_PROVIDER_SITE_OTHER): Payer: Medicare Other | Admitting: Cardiology

## 2014-07-09 VITALS — BP 132/60 | HR 64 | Ht 60.0 in | Wt 223.0 lb

## 2014-07-09 DIAGNOSIS — I5032 Chronic diastolic (congestive) heart failure: Secondary | ICD-10-CM

## 2014-07-09 DIAGNOSIS — I359 Nonrheumatic aortic valve disorder, unspecified: Secondary | ICD-10-CM

## 2014-07-09 DIAGNOSIS — I10 Essential (primary) hypertension: Secondary | ICD-10-CM

## 2014-07-09 DIAGNOSIS — E785 Hyperlipidemia, unspecified: Secondary | ICD-10-CM

## 2014-07-09 LAB — CBC WITH DIFFERENTIAL/PLATELET
Basophils Absolute: 0 10*3/uL (ref 0.0–0.1)
Basophils Relative: 0 % (ref 0–1)
EOS ABS: 0.3 10*3/uL (ref 0.0–0.7)
EOS PCT: 4 % (ref 0–5)
HEMATOCRIT: 38.6 % (ref 36.0–46.0)
Hemoglobin: 12.7 g/dL (ref 12.0–15.0)
LYMPHS PCT: 23 % (ref 12–46)
Lymphs Abs: 1.6 10*3/uL (ref 0.7–4.0)
MCH: 30.1 pg (ref 26.0–34.0)
MCHC: 32.9 g/dL (ref 30.0–36.0)
MCV: 91.5 fL (ref 78.0–100.0)
MONOS PCT: 9 % (ref 3–12)
MPV: 10.5 fL (ref 8.6–12.4)
Monocytes Absolute: 0.6 10*3/uL (ref 0.1–1.0)
NEUTROS PCT: 64 % (ref 43–77)
Neutro Abs: 4.4 10*3/uL (ref 1.7–7.7)
Platelets: 278 10*3/uL (ref 150–400)
RBC: 4.22 MIL/uL (ref 3.87–5.11)
RDW: 13.9 % (ref 11.5–15.5)
WBC: 6.8 10*3/uL (ref 4.0–10.5)

## 2014-07-09 LAB — BASIC METABOLIC PANEL
BUN: 18 mg/dL (ref 6–23)
CHLORIDE: 101 meq/L (ref 96–112)
CO2: 26 mEq/L (ref 19–32)
CREATININE: 0.7 mg/dL (ref 0.50–1.10)
Calcium: 9.5 mg/dL (ref 8.4–10.5)
Glucose, Bld: 90 mg/dL (ref 70–99)
POTASSIUM: 4.5 meq/L (ref 3.5–5.3)
Sodium: 138 mEq/L (ref 135–145)

## 2014-07-09 LAB — LIPID PANEL
CHOL/HDL RATIO: 2.5 ratio
CHOLESTEROL: 140 mg/dL (ref 0–200)
HDL: 55 mg/dL (ref >39)
LDL Cholesterol: 36 mg/dL (ref 0–99)
TRIGLYCERIDES: 243 mg/dL — AB (ref <150)
VLDL: 49 mg/dL — ABNORMAL HIGH (ref 0–40)

## 2014-07-09 LAB — TSH: TSH: 1.017 u[IU]/mL (ref 0.350–4.500)

## 2014-07-09 NOTE — Patient Instructions (Signed)
Your physician recommends that you have  lab work today--Lipid profile/BMET/CBCd/TSH.  Your physician wants you to follow-up in: 1 year with Dr Aundra Dubin. (January 2017).  You will receive a reminder letter in the mail two months in advance. If you don't receive a letter, please call our office to schedule the follow-up appointment.

## 2014-07-10 NOTE — Progress Notes (Signed)
Patient ID: Julie Graves, female   DOB: [birth year], 74 y.o.   MRN: [id number] PCP: Billey Chang  74 yo with history of HTN and obesity was admitted to [facility] in 3/11 with atypical chest pain and palpitations. She ended up having a heart catheterization that showed nonobstructive CAD, but EF was decreased at 40%. She was also diagnosed with diabetes. Her palpitations seemed to correlate with PACs on telemetry. Echo in 9/11 showed recovery of LV systolic function (EF 08-65%).  Repeat echo in 8/12 showed EF 50-55% with mild AI and mild MR.  She had ETT-Cardiolite in 10/13 with poor exercise tolerance and a small apical defect suspicious for attenuation, EF 51%.  Lexiscan-Cardiolite in 3/15 was probably normal with breast attenuation.   She is doing well overall.  Weight is down another 7 lbs.  She had right TKR in 6/15.  She walks and uses the machines at the [facility] several times a week.  No chest pain, no exertional dyspnea.  No lightheadedness.  Main complaint is fatigue. She does not snore and is not sleepy during the day.     Labs (3/11): K 4.3, creatinine 0.69, BNP 41, LDL 82, HDL 49  Labs (4/11): K 4.3, creatinine 0.7, BNP 75  Labs (5/11): LDL 53, HDL 52, LFTs normal  Labs (6/11): creatinine 0.6, K 4.5  Labs (9/11): K 4.4, creatinine 0.7  Labs (11/11): LDL 71, HDL 60, TGs 128, LFTs normal  Labs (8/12): TSH normal, BNP 80, HCT 39.3, LDL 64, HDL 66, K 4.4, creatinine 0.6 Labs (3/13): K 4.1, creatinine 0.7, LDL 47, HDL 56 Las (10/13): LDL 56, HDL 56 Labs (12/14): K 3.7, creatinine 0.52, BNP 137 Labs (6/15): K 4.1, creatinine 0.62  Allergies:  1) ! Iodine  2) ! Penicillin   Past Medical History:  1. Frozen shoulder  2. Obesity  3. HTN: ACEI cough.  4. Diabetes  5. Hyperlipidemia  6. Palpitations: PACs noted on telemetry while in the hospital  7. Nonischemic cardiomyopathy: Echo (3/11): Difficult study, moderate aortic insufficiency noted. Left ventriculogram (3/11): EF 40%, global  hypokinesis. Echo (9/11): EF 55-60%, moderate diastolic dysfunction, mild aortic insufficiency.  Echo (8/12): EF 50-55%, mild AI, mild MR.  Cardiolite 10/13 with EF 51%.  8. Osteoarthritis  9. CAD (nonobstructive): Left heart cath (3/11): 40% ostial D1, 30% mid CFX. ETT-Cardiolite (10/13) with 2'59" exercise, small apical perfusion defect concerning for attenuation, EF 51% with global HK.  Lexiscan Cardiolite (3/15) was probably normal with breast attenuation.  10. Aortic insufficiency: Mild on 9/11 cath.  11. Low back pain/sciatica 12. Depression 13. Right TKR (6/15)  Family History:  Mother: CVA at 37  Siblings: sister has diabetes and heart disease.  Siblings: sister who is deceased in her 65s of an MI.  Siblings: brother with diabetes  Father: deceased at the age of 3 with an MI.   Social History:  Retired Hotel manager, lives in [place]. 3 children.  Tobacco Use - No, quit 1970s.  Alcohol Use - yes  Drug Use - no  Current Outpatient Prescriptions  Medication Sig Dispense Refill  . alendronate (FOSAMAX) 70 MG tablet Take 70 mg by mouth every 7 (seven) days. Take with a full glass of water on an empty stomach. Take on mondays    . ALPRAZolam (XANAX) 0.25 MG tablet Take 0.25 mg by mouth 2 (two) times daily as needed for anxiety. Take 1-2 tablets twice a day as needed for anxiety    . aspirin 81 MG tablet  Take 81 mg by mouth every evening.     . calcium carbonate (OS-CAL) 600 MG TABS Take 600 mg by mouth 2 (two) times daily with a meal.     . carvedilol (COREG) 6.25 MG tablet Take 6.25 mg by mouth 2 (two) times daily.      . Cholecalciferol (VITAMIN D) 1000 UNITS capsule Take 2,000 Units by mouth daily.     . furosemide (LASIX) 20 MG tablet Take 20 mg by mouth every morning.     Marland Kitchen losartan (COZAAR) 25 MG tablet Take 25 mg by mouth every morning.     . potassium chloride SA (KLOR-CON M20) 20 MEQ tablet Take 20 mEq by mouth every morning.     . sertraline (ZOLOFT) 25 MG tablet  Take 25 mg by mouth daily.     . simvastatin (ZOCOR) 40 MG tablet Take 40 mg by mouth at bedtime.       No current facility-administered medications for this visit.    BP 132/60 mmHg  Pulse 64  Ht 5' (1.524 m)  Wt 223 lb (101.152 kg)  BMI 43.55 kg/m2  SpO2 98% General: NAD, obese.  Neck: JVP not elevated, no thyromegaly or thyroid nodule.  Lungs: Clear to auscultation bilaterally with normal respiratory effort. CV: Nondisplaced PMI.  Heart regular S1/S2, no S3/S4, no murmur.  Trace ankle edema.  No carotid bruit.  Normal pedal pulses.  Abdomen: Soft, nontender, no hepatosplenomegaly, no distention.  Neurologic: Alert and oriented x 3.  Psych: Normal affect. Extremities: No clubbing or cyanosis.   Assessment/Plan: HYPERLIPIDEMIA Continue simvastatin, check lipids today. HYPERTENSION  BP seems to be under reasonable control on current regimen.  Chronic diastolic CHF No significant dyspnea, weight down.  Continue current Lasix.  Obesity She is exercising and weight is coming down slowly.    Loralie Champagne 07/10/2014

## 2014-07-21 ENCOUNTER — Telehealth: Payer: Self-pay | Admitting: Cardiology

## 2014-07-21 NOTE — Telephone Encounter (Signed)
New Message  Pt calling to speak w/ Lelon Frohlich. Please call back and discuss. Can leave message on answering service.

## 2014-07-21 NOTE — Telephone Encounter (Signed)
Spoke with patient about recent lab results 

## 2014-09-09 ENCOUNTER — Other Ambulatory Visit: Payer: Self-pay | Admitting: Family Medicine

## 2014-09-09 DIAGNOSIS — M8580 Other specified disorders of bone density and structure, unspecified site: Secondary | ICD-10-CM

## 2014-09-09 DIAGNOSIS — Z1231 Encounter for screening mammogram for malignant neoplasm of breast: Secondary | ICD-10-CM

## 2014-09-21 ENCOUNTER — Ambulatory Visit
Admission: RE | Admit: 2014-09-21 | Discharge: 2014-09-21 | Disposition: A | Discharge status: Home / Self Care | Payer: Medicare Other | Source: Ambulatory Visit | Attending: Family Medicine | Admitting: Family Medicine

## 2014-09-21 DIAGNOSIS — M8580 Other specified disorders of bone density and structure, unspecified site: Secondary | ICD-10-CM

## 2014-09-21 DIAGNOSIS — Z1231 Encounter for screening mammogram for malignant neoplasm of breast: Secondary | ICD-10-CM

## 2014-12-06 ENCOUNTER — Encounter: Payer: Self-pay | Admitting: Gastroenterology

## 2015-03-16 ENCOUNTER — Encounter: Payer: Self-pay | Admitting: Gastroenterology

## 2015-03-30 ENCOUNTER — Ambulatory Visit: Payer: Medicare Other | Attending: Family Medicine | Admitting: Physical Therapy

## 2015-03-30 DIAGNOSIS — R262 Difficulty in walking, not elsewhere classified: Secondary | ICD-10-CM | POA: Diagnosis present

## 2015-03-30 DIAGNOSIS — R29898 Other symptoms and signs involving the musculoskeletal system: Secondary | ICD-10-CM | POA: Diagnosis not present

## 2015-03-30 NOTE — Patient Instructions (Signed)
   1. Marching    Alternate lifting knees as high as is comfortable, as if marching. Repeat _10__ times each leg. Do __1-2_ sessions per day. Note: If possible place feet on floor.  Copyright  VHI. All rights reserved.     2. Long Arc Quad    Tighten muscle on front of thigh and extend leg. Hold straight for __1-2__ seconds. Repeat _10___ times. Do __1-2__ sessions per day.  Copyright  VHI. All rights reserved.

## 2015-03-30 NOTE — Therapy (Signed)
[place], [place], [address] Phone: [phone]   Fax:  [phone]  Physical Therapy Evaluation  Patient Details  Name: Julie Graves MRN: [id number] Date of Birth: 04/03/1941 Referring Provider:  Leamon Arnt, MD  Encounter Date: 03/30/2015      PT End of Session - 03/30/15 1049    Visit Number 1   Number of Visits 5   Date for PT Re-Evaluation 05/04/15   Authorization Type UHC Medicare   Authorization Time Period Gcode 10th visit; progress note by 10th visit   PT Start Time 1015   PT Stop Time 1047   PT Time Calculation (min) 32 min   Activity Tolerance Patient tolerated treatment well   Behavior During Therapy [facility] for tasks assessed/performed      Past Medical History  Diagnosis Date  . Frozen shoulder   . Obese   . HTN (hypertension)     ACEI cough  . HLD (hyperlipidemia)   . Palpitation     PACs noted on telemetry while in the hospital  . Nonischemic cardiomyopathy     Ech 3/11 difficult study, moderate aortic insuficiency noted. Left evntriculogram 3/11  . Osteoarthritis   . CAD (coronary artery disease)     nonobstructive let heart cath 3/11: 40% ostial D1, 30% mid CFX  . Aortic insufficiency     mild on 9/11 cath  . CHF (congestive heart failure)   . Pneumonia     hx  . Diabetes mellitus     no meds  . Anxiety     rx given recent not taken yet  . GERD (gastroesophageal reflux disease)     occ  . Stress incontinence   . Hx of cardiovascular stress test     Lexiscan Myoview (10/2013):  Fixed ant defect most c/w breast attenuation, cannot exclude apical infarct; no ischemia, EF 46%; Low Risk  . Colon polyps 02/01/2009     MULTIPLE FRAGMENTS OF TUBULAR ADENOMAS  . Anginal pain     "small pain?nerves in chest"started 10/05/13   . Anginal pain     cleared by cardiology 3/15    Past Surgical History  Procedure Laterality Date  . Abdominal hysterectomy    . Tubal  ligation    . Appendectomy    . Eye surgery    . Bladder neck suspension  85  . Knee arthroplasty Right 12/09/2013    Procedure: COMPUTER ASSISTED Right TOTAL KNEE ARTHROPLASTY;  Surgeon: Marybelle Killings, MD;  Location: [facility];  Service: Orthopedics;  Laterality: Right;    There were no vitals filed for this visit.  Visit Diagnosis:  Weakness of right lower extremity  Difficulty walking      Subjective Assessment - 03/30/15 1020    Subjective Pt is a 74 y/o female who presents to OPPT s/p R TKA 12/09/2013.  Pt reports having OPPT initially following TKA for 8 weeks at [facility].  Pt states she had to stop due to financial limitations.  Pt persents today with continued weakness and pain.     Limitations Walking   Patient Stated Goals walk for exercise (prior to surgery able to walk 1 mile on track at [facility])   Currently in Pain? Yes   Pain Score 5    Pain Location Knee   Pain Orientation Right   Pain Descriptors / Indicators Sore   Pain Type Chronic pain   Pain Onset More than a month ago   Pain Frequency Intermittent  Aggravating Factors  walking, standing   Pain Relieving Factors rest, medication            [facility] PT Assessment - 03/30/15 1028    Assessment   Medical Diagnosis R TKA   Onset Date/Surgical Date 12/09/13   Next MD Visit 04/12/15   Prior Therapy OPPT after surgery   Precautions   Precautions None   Restrictions   Weight Bearing Restrictions No   Balance Screen   Has the patient fallen in the past 6 months No   Has the patient had a decrease in activity level because of a fear of falling?  No   Is the patient reluctant to leave their home because of a fear of falling?  No   Home Environment   Living Environment Private residence   Living Arrangements Spouse/significant other   Type of [place] to enter   Entrance Stairs-Number of Steps 1   Entrance Stairs-Rails None   Home Layout Two level;Bed/bath upstairs;Able to live on main  level with bedroom/bathroom   Alternate Level Stairs-Number of Steps 13   Alternate Level Stairs-Rails Right   Prior Function   Level of Independence Independent;Independent with household mobility without device   Vocation Retired   Observation/Other Assessments   Focus on Therapeutic Outcomes (FOTO)  49 (51% limited; predicted 43% limited)   AROM   AROM Assessment Site Knee   Right/Left Knee Right;Left   Right Knee Extension -6   Right Knee Flexion 85   Left Knee Extension -5   Left Knee Flexion 103   Strength   Strength Assessment Site Hip;Knee   Right Hip Flexion 3-/5   Right Hip Extension 3/5   Right Hip ABduction 3/5   Left Hip Flexion 4/5   Left Hip Extension 3+/5   Left Hip ABduction 4-/5   Right/Left Knee Right;Left   Right Knee Flexion 3/5   Right Knee Extension 3+/5   Left Knee Flexion 5/5   Left Knee Extension 4/5                           PT Education - 03/30/15 1044    Education provided Yes   Education Details HEP   Person(s) Educated Patient   Methods Explanation;Demonstration;Handout   Comprehension Verbalized understanding;Returned demonstration          PT Short Term Goals - 03/30/15 1052    PT SHORT TERM GOAL #1   Title all STGs=LTGs           PT Long Term Goals - 03/30/15 1053    PT LONG TERM GOAL #1   Title independent with HEP (05/04/15)   Time 5   Period Weeks   Status New   PT LONG TERM GOAL #2   Title report attendance at [employer] at least 2x/wk for return to community fitness (05/04/15)   Time 5   Period Weeks   Status New   PT LONG TERM GOAL #3   Title improve strength in RLE to at least 4/5 knee flex/ext for improved mobility (05/04/15)   Time 5   Period Weeks   Status New               Plan - 03/30/15 1050    Clinical Impression Statement Pt is a 74 y/o female who presents to OPPT with RLE weakness following TKA over 1 year ago.  Pt will benefit from OPPT to establish HEP  and return to  community fitness.    Pt will benefit from skilled therapeutic intervention in order to improve on the following deficits Pain;Decreased range of motion;Decreased strength   Rehab Potential Good   PT Frequency 1x / week   PT Duration 6 weeks   PT Treatment/Interventions ADLs/Self Care Home Management;Cryotherapy;Electrical Stimulation;Moist Heat;Therapeutic exercise;Therapeutic activities;Functional mobility training;Gait training;Ultrasound;Balance training;Neuromuscular re-education;Manual techniques;Vasopneumatic Device   PT Next Visit Plan establish HEP for lower extremity strengthening   Consulted and Agree with Plan of Care Patient          G-Codes - 04-11-15 1056    Functional Assessment Tool Used FOTO 51% limited   Functional Limitation Mobility: Walking and moving around   Mobility: Walking and Moving Around Current Status (X5170) At least 40 percent but less than 60 percent impaired, limited or restricted   Mobility: Walking and Moving Around Goal Status (Y1749) At least 40 percent but less than 60 percent impaired, limited or restricted       Problem List Patient Active Problem List   Diagnosis Date Noted  . Osteoarthritis of right knee 12/09/2013    Class: Diagnosis of  . Urge incontinence 04/02/2013  . Spinal stenosis, lumbar region, without neurogenic claudication 10/27/2012  . Spinal stenosis of lumbar region 10/01/2012  . Hyperlipidemia 05/27/2012  . Allergic rhinitis 09/26/2011  . Dizziness 01/23/2011  . Dyspnea 01/23/2011  . Type II diabetes mellitus ([place]) 06/27/2010  . Morbid obesity ([place]) 06/27/2010  . Coronary atherosclerosis 06/22/2010  . Hypertension, benign 06/22/2010  . Disorder of bone and cartilage 06/22/2010  . Primary cardiomyopathy ([place]) 03/23/2010  . HYPERLIPIDEMIA-MIXED 03/07/2010  . Essential hypertension 03/07/2010  . CARDIOMEGALY 11/30/2009  . OTHER DYSPNEA AND RESPIRATORY ABNORMALITIES 11/30/2009  . CAD 09/28/2009  . AORTIC  REGURGITATION 09/28/2009  . Chronic diastolic heart failure ([place]) 09/28/2009  . 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 Osteoarthrosis involving multiple sites 10/26/2008   Laureen Abrahams, PT, DPT April 11, 2015 10:58 AM  [facility] [address] [place], [place], [address] Phone: [phone]   Fax:  [phone]

## 2015-04-08 ENCOUNTER — Ambulatory Visit: Payer: Medicare Other | Admitting: Physical Therapy

## 2015-04-08 DIAGNOSIS — R29898 Other symptoms and signs involving the musculoskeletal system: Secondary | ICD-10-CM | POA: Diagnosis not present

## 2015-04-08 DIAGNOSIS — R262 Difficulty in walking, not elsewhere classified: Secondary | ICD-10-CM

## 2015-04-08 NOTE — Therapy (Signed)
[place], [place], [address] Phone: [phone]   Fax:  [phone]  Physical Therapy Treatment  Patient Details  Name: Julie Graves MRN: [id number] Date of Birth: 05-23-1941 No Data Recorded  Encounter Date: 04/08/2015      PT End of Session - 04/08/15 1149    Visit Number 2   Number of Visits 5   Date for PT Re-Evaluation 05/04/15   Authorization Type UHC Medicare   Authorization Time Period Gcode 10th visit; progress note by 10th visit   PT Start Time 1100   PT Stop Time 1145   PT Time Calculation (min) 45 min   Activity Tolerance Patient tolerated treatment well   Behavior During Therapy [facility] for tasks assessed/performed      Past Medical History  Diagnosis Date  . Frozen shoulder   . Obese   . HTN (hypertension)     ACEI cough  . HLD (hyperlipidemia)   . Palpitation     PACs noted on telemetry while in the hospital  . Nonischemic cardiomyopathy     Ech 3/11 difficult study, moderate aortic insuficiency noted. Left evntriculogram 3/11  . Osteoarthritis   . CAD (coronary artery disease)     nonobstructive let heart cath 3/11: 40% ostial D1, 30% mid CFX  . Aortic insufficiency     mild on 9/11 cath  . CHF (congestive heart failure)   . Pneumonia     hx  . Diabetes mellitus     no meds  . Anxiety     rx given recent not taken yet  . GERD (gastroesophageal reflux disease)     occ  . Stress incontinence   . Hx of cardiovascular stress test     Lexiscan Myoview (10/2013):  Fixed ant defect most c/w breast attenuation, cannot exclude apical infarct; no ischemia, EF 46%; Low Risk  . Colon polyps 02/01/2009     MULTIPLE FRAGMENTS OF TUBULAR ADENOMAS  . Anginal pain     "small pain?nerves in chest"started 10/05/13   . Anginal pain     cleared by cardiology 3/15    Past Surgical History  Procedure Laterality Date  . Abdominal hysterectomy    . Tubal ligation    . Appendectomy     . Eye surgery    . Bladder neck suspension  85  . Knee arthroplasty Right 12/09/2013    Procedure: COMPUTER ASSISTED Right TOTAL KNEE ARTHROPLASTY;  Surgeon: Marybelle Killings, MD;  Location: [facility];  Service: Orthopedics;  Laterality: Right;    There were no vitals filed for this visit.  Visit Diagnosis:  Weakness of right lower extremity  Difficulty walking      Subjective Assessment - 04/08/15 1109    Subjective " I have no pain it just feels weak in the knee"   Currently in Pain? No/denies   Pain Score 0-No pain   Pain Location Knee   Pain Orientation Right   Pain Descriptors / Indicators --  weakness   Pain Onset More than a month ago            [facility] PT Assessment - 04/08/15 0001    AROM   Right Knee Flexion 105                     [facility] Adult PT Treatment/Exercise - 04/08/15 1110    Knee/Hip Exercises: Aerobic   Nustep L6 x 5 min   Knee/Hip Exercises: Standing  Heel Raises 1 set;15 reps   Other Standing Knee Exercises standing hip abduction/ extension 1 x 10 each.   Knee/Hip Exercises: Seated   Long Arc Quad AROM;Strengthening;Right;1 set;15 reps   Long Arc Quad Weight --  2 1/2 Building surveyor AROM;Strengthening;Right;1 set;15 Chartered certified accountant --  2 1/2   Abduction/Adduction  Strengthening;Both;1 set;15 reps  red theraband   Sit to Sand 1 set;10 reps  VC to avoid using legs on table for support   Knee/Hip Exercises: Supine   Bridges AROM;Both;1 set;10 reps  VC to squeeze bottom together at the top   Straight Leg Raises AROM;Strengthening;Right;1 set;10 reps  VC to keep quad contracted   Manual Therapy   Manual Therapy Joint mobilization   Joint Mobilization grade 2-3 tibiofemoral joint P<>A                PT Education - 04/08/15 1149    Education provided Yes   Education Details updated HEP   Person(s) Educated Patient   Methods Explanation   Comprehension Verbalized understanding          PT Short Term Goals -  03/30/15 1052    PT SHORT TERM GOAL #1   Title all STGs=LTGs           PT Long Term Goals - 04/08/15 1153    PT LONG TERM GOAL #1   Title independent with HEP (05/04/15)   Time 5   Period Weeks   Status On-going   PT LONG TERM GOAL #2   Title report attendance at [employer] at least 2x/wk for return to community fitness (05/04/15)   Time 5   Period Weeks   Status On-going   PT LONG TERM GOAL #3   Title improve strength in RLE to at least 4/5 knee flex/ext for improved mobility (05/04/15)   Time 5   Period Weeks   Status On-going               Plan - 04/08/15 1149    Clinical Impression Statement Meridith reports that she is doing better today compared to last visit reporting no pain just weakness. She was able to complete all exercises except for standing hip abduction reporting it caused her hip to hurt, modified to seated clamshells with red theraband which she reported no pain.   PT Next Visit Plan LE strengthening   PT Home Exercise Plan straight leg raises, bridges, clamshells   Consulted and Agree with Plan of Care Patient        Problem List Patient Active Problem List   Diagnosis Date Noted  . Osteoarthritis of right knee 12/09/2013    Class: Diagnosis of  . Urge incontinence 04/02/2013  . Spinal stenosis, lumbar region, without neurogenic claudication 10/27/2012  . Spinal stenosis of lumbar region 10/01/2012  . Hyperlipidemia 05/27/2012  . Allergic rhinitis 09/26/2011  . Dizziness 01/23/2011  . Dyspnea 01/23/2011  . Type II diabetes mellitus ([place]) 06/27/2010  . Morbid obesity ([place]) 06/27/2010  . Coronary atherosclerosis 06/22/2010  . Hypertension, benign 06/22/2010  . Disorder of bone and cartilage 06/22/2010  . Primary cardiomyopathy ([place]) 03/23/2010  . HYPERLIPIDEMIA-MIXED 03/07/2010  . Essential hypertension 03/07/2010  . CARDIOMEGALY 11/30/2009  . OTHER DYSPNEA AND RESPIRATORY ABNORMALITIES 11/30/2009  . CAD 09/28/2009  . AORTIC  REGURGITATION 09/28/2009  . Chronic diastolic heart failure ([place]) 09/28/2009  . 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 Osteoarthrosis involving multiple sites 10/26/2008   Elisama Thissen PT, DPT, LAT, ATC  04/08/2015  11:55 AM      [place], [place], [address] Phone: [phone]   Fax:  [phone]  Name: KAYLOR MAIERS MRN: [id number] Date of Birth: 1940/08/01

## 2015-04-08 NOTE — Patient Instructions (Signed)
   Haniyyah Sakuma PT, DPT, LAT, ATC  [facility] Phone: [phone]     

## 2015-04-13 ENCOUNTER — Ambulatory Visit: Payer: Medicare Other | Admitting: Physical Therapy

## 2015-04-13 DIAGNOSIS — R262 Difficulty in walking, not elsewhere classified: Secondary | ICD-10-CM

## 2015-04-13 DIAGNOSIS — R29898 Other symptoms and signs involving the musculoskeletal system: Secondary | ICD-10-CM

## 2015-04-13 NOTE — Therapy (Signed)
[place], [place], [address] Phone: [phone]   Fax:  [phone]  Physical Therapy Treatment  Patient Details  Name: Julie Graves MRN: [id number] Date of Birth: [birth year] No Data Recorded  Encounter Date: 04/13/2015      PT End of Session - 04/13/15 1144    Visit Number 3   Number of Visits 5   Date for PT Re-Evaluation 05/04/15   Authorization Type UHC Medicare   Authorization Time Period Gcode 10th visit; progress note by 10th visit   PT Start Time 1100   PT Stop Time 1143   PT Time Calculation (min) 43 min   Activity Tolerance Patient tolerated treatment well   Behavior During Therapy [facility] for tasks assessed/performed      Past Medical History  Diagnosis Date  . Frozen shoulder   . Obese   . HTN (hypertension)     ACEI cough  . HLD (hyperlipidemia)   . Palpitation     PACs noted on telemetry while in the hospital  . Nonischemic cardiomyopathy     Ech 3/11 difficult study, moderate aortic insuficiency noted. Left evntriculogram 3/11  . Osteoarthritis   . CAD (coronary artery disease)     nonobstructive let heart cath 3/11: 40% ostial D1, 30% mid CFX  . Aortic insufficiency     mild on 9/11 cath  . CHF (congestive heart failure)   . Pneumonia     hx  . Diabetes mellitus     no meds  . Anxiety     rx given recent not taken yet  . GERD (gastroesophageal reflux disease)     occ  . Stress incontinence   . Hx of cardiovascular stress test     Lexiscan Myoview (10/2013):  Fixed ant defect most c/w breast attenuation, cannot exclude apical infarct; no ischemia, EF 46%; Low Risk  . Colon polyps 02/01/2009     MULTIPLE FRAGMENTS OF TUBULAR ADENOMAS  . Anginal pain     "small pain?nerves in chest"started 10/05/13   . Anginal pain     cleared by cardiology 3/15    Past Surgical History  Procedure Laterality Date  . Abdominal hysterectomy    . Tubal ligation    . Appendectomy     . Eye surgery    . Bladder neck suspension  85  . Knee arthroplasty Right 12/09/2013    Procedure: COMPUTER ASSISTED Right TOTAL KNEE ARTHROPLASTY;  Surgeon: Marybelle Killings, MD;  Location: [facility];  Service: Orthopedics;  Laterality: Right;    There were no vitals filed for this visit.  Visit Diagnosis:  Weakness of right lower extremity  Difficulty walking      Subjective Assessment - 04/13/15 1107    Subjective no pain; just weak   Patient Stated Goals walk for exercise (prior to surgery able to walk 1 mile on track [facility])   Currently in Pain? No/denies                         [facility] Adult PT Treatment/Exercise - 04/13/15 1108    Knee/Hip Exercises: Aerobic   Nustep L4 x 8 min   Knee/Hip Exercises: Machines for Strengthening   Cybex Knee Extension 15# x 10   Knee/Hip Exercises: Seated   Long Arc Quad Strengthening;Both;2 sets;10 reps   Long Arc Quad Weight 3 lbs.   Marching Both;2 sets;10 reps   Marching Weights 3 lbs.   Hamstring Curl  Both;2 sets;10 reps   Hamstring Limitations red theraband   Sit to [employer] 10 reps;2 sets   Knee/Hip Exercises: Supine   Bridges Both;2 sets;10 reps   Straight Leg Raises Both;2 sets;10 reps   Other Supine Knee/Hip Exercises clamshell with red tband 2x10                  PT Short Term Goals - 03/30/15 1052    PT SHORT TERM GOAL #1   Title all STGs=LTGs           PT Long Term Goals - 04/08/15 1153    PT LONG TERM GOAL #1   Title independent with HEP (05/04/15)   Time 5   Period Weeks   Status On-going   PT LONG TERM GOAL #2   Title report attendance at [employer] at least 2x/wk for return to community fitness (05/04/15)   Time 5   Period Weeks   Status On-going   PT LONG TERM GOAL #3   Title improve strength in RLE to at least 4/5 knee flex/ext for improved mobility (05/04/15)   Time 5   Period Weeks   Status On-going               Plan - 04/13/15 1144    Clinical Impression Statement  Pt reports compliance with HEP and tolerated exercises well today.     PT Next Visit Plan LE strengthening; add to HEP   PT Home Exercise Plan straight leg raises, bridges, clamshells   Consulted and Agree with Plan of Care Patient        Problem List Patient Active Problem List   Diagnosis Date Noted  . Osteoarthritis of right knee 12/09/2013    Class: Diagnosis of  . Urge incontinence 04/02/2013  . Spinal stenosis, lumbar region, without neurogenic claudication 10/27/2012  . Spinal stenosis of lumbar region 10/01/2012  . Hyperlipidemia 05/27/2012  . Allergic rhinitis 09/26/2011  . Dizziness 01/23/2011  . Dyspnea 01/23/2011  . Type II diabetes mellitus ([place]) 06/27/2010  . Morbid obesity ([place]) 06/27/2010  . Coronary atherosclerosis 06/22/2010  . Hypertension, benign 06/22/2010  . Disorder of bone and cartilage 06/22/2010  . Primary cardiomyopathy ([place]) 03/23/2010  . HYPERLIPIDEMIA-MIXED 03/07/2010  . Essential hypertension 03/07/2010  . CARDIOMEGALY 11/30/2009  . OTHER DYSPNEA AND RESPIRATORY ABNORMALITIES 11/30/2009  . CAD 09/28/2009  . AORTIC REGURGITATION 09/28/2009  . Chronic diastolic heart failure ([place]) 09/28/2009  . 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 Osteoarthrosis involving multiple sites 10/26/2008   Julie Graves, PT, DPT 04/13/2015 11:45 AM  [facility] [address] [place], [place], [address] Phone: [phone]   Fax:  [phone]  Name: Julie Graves MRN: [id number] Date of Birth: [birth year]

## 2015-04-20 ENCOUNTER — Ambulatory Visit: Payer: Medicare Other | Admitting: Physical Therapy

## 2015-04-20 DIAGNOSIS — R29898 Other symptoms and signs involving the musculoskeletal system: Secondary | ICD-10-CM

## 2015-04-20 DIAGNOSIS — R262 Difficulty in walking, not elsewhere classified: Secondary | ICD-10-CM

## 2015-04-20 NOTE — Therapy (Signed)
[place], [place], [address] Phone: [phone]   Fax:  [phone]  Physical Therapy Treatment  Patient Details  Name: Julie Graves MRN: [id number] Date of Birth: [birth year] No Data Recorded  Encounter Date: 04/20/2015      PT End of Session - 04/20/15 1145    Visit Number 4   Number of Visits 5   Date for PT Re-Evaluation 05/04/15   Authorization Type UHC Medicare   Authorization Time Period Gcode 10th visit; progress note by 10th visit   PT Start Time 1058   PT Stop Time 1138   PT Time Calculation (min) 40 min   Activity Tolerance Patient tolerated treatment well   Behavior During Therapy [facility] for tasks assessed/performed      Past Medical History  Diagnosis Date  . Frozen shoulder   . Obese   . HTN (hypertension)     ACEI cough  . HLD (hyperlipidemia)   . Palpitation     PACs noted on telemetry while in the hospital  . Nonischemic cardiomyopathy     Ech 3/11 difficult study, moderate aortic insuficiency noted. Left evntriculogram 3/11  . Osteoarthritis   . CAD (coronary artery disease)     nonobstructive let heart cath 3/11: 40% ostial D1, 30% mid CFX  . Aortic insufficiency     mild on 9/11 cath  . CHF (congestive heart failure)   . Pneumonia     hx  . Diabetes mellitus     no meds  . Anxiety     rx given recent not taken yet  . GERD (gastroesophageal reflux disease)     occ  . Stress incontinence   . Hx of cardiovascular stress test     Lexiscan Myoview (10/2013):  Fixed ant defect most c/w breast attenuation, cannot exclude apical infarct; no ischemia, EF 46%; Low Risk  . Colon polyps 02/01/2009     MULTIPLE FRAGMENTS OF TUBULAR ADENOMAS  . Anginal pain     "small pain?nerves in chest"started 10/05/13   . Anginal pain     cleared by cardiology 3/15    Past Surgical History  Procedure Laterality Date  . Abdominal hysterectomy    . Tubal ligation    . Appendectomy     . Eye surgery    . Bladder neck suspension  85  . Knee arthroplasty Right 12/09/2013    Procedure: COMPUTER ASSISTED Right TOTAL KNEE ARTHROPLASTY;  Surgeon: Marybelle Killings, MD;  Location: [place];  Service: Orthopedics;  Laterality: Right;    There were no vitals filed for this visit.  Visit Diagnosis:  Weakness of right lower extremity  Difficulty walking      Subjective Assessment - 04/20/15 1104    Subjective feels like her leg is getting stronger   Patient Stated Goals walk for exercise (prior to surgery able to walk 1 mile on track at [facility])   Currently in Pain? No/denies                         [facility] Adult PT Treatment/Exercise - 04/20/15 1105    Knee/Hip Exercises: Aerobic   Nustep L4 x 8 min   Knee/Hip Exercises: Standing   Heel Raises 2 sets;10 reps;Both   Hip Abduction Both;2 sets;10 reps   Hip Extension Both;2 sets;10 reps   Forward Step Up Both;1 set;Hand Hold: 1;10 reps;Step Height: 4"   Other Standing Knee Exercises tandem walking; amb on heels/toes  with 1 UE support   Knee/Hip Exercises: Seated   Long Arc Quad Strengthening;Both;2 sets;10 reps   Long Arc Quad Weight 2 lbs.   Marching Both;2 sets;10 reps   Marching Weights 2 lbs.   Sit to Sand 10 reps;2 sets                PT Education - 04/20/15 1145    Education provided Yes   Education Details HEP   Person(s) Educated Patient   Methods Explanation;Demonstration;Handout   Comprehension Verbalized understanding;Returned demonstration;Need further instruction          PT Short Term Goals - 03/30/15 1052    PT SHORT TERM GOAL #1   Title all STGs=LTGs           PT Long Term Goals - 04/08/15 1153    PT LONG TERM GOAL #1   Title independent with HEP (05/04/15)   Time 5   Period Weeks   Status On-going   PT LONG TERM GOAL #2   Title report attendance at [employer] at least 2x/wk for return to community fitness (05/04/15)   Time 5   Period Weeks   Status On-going    PT LONG TERM GOAL #3   Title improve strength in RLE to at least 4/5 knee flex/ext for improved mobility (05/04/15)   Time 5   Period Weeks   Status On-going               Plan - 04/20/15 1146    Clinical Impression Statement Pt with new onset of R sided low back/hip pain.  Unable to palpate any tenderness and pt tolerated exercises well.  Recommended pt follow up with MD if pain persists and will monitor activity and adjust as needed.   PT Next Visit Plan LE strengthening; add to HEP   PT Home Exercise Plan straight leg raises, bridges, clamshells; standing calf raises, hip abdct and hip ext   Consulted and Agree with Plan of Care Patient        Problem List Patient Active Problem List   Diagnosis Date Noted  . Osteoarthritis of right knee 12/09/2013    Class: Diagnosis of  . Urge incontinence 04/02/2013  . Spinal stenosis, lumbar region, without neurogenic claudication 10/27/2012  . Spinal stenosis of lumbar region 10/01/2012  . Hyperlipidemia 05/27/2012  . Allergic rhinitis 09/26/2011  . Dizziness 01/23/2011  . Dyspnea 01/23/2011  . Type II diabetes mellitus ([place]) 06/27/2010  . Morbid obesity ([place]) 06/27/2010  . Coronary atherosclerosis 06/22/2010  . Hypertension, benign 06/22/2010  . Disorder of bone and cartilage 06/22/2010  . Primary cardiomyopathy ([place]) 03/23/2010  . HYPERLIPIDEMIA-MIXED 03/07/2010  . Essential hypertension 03/07/2010  . CARDIOMEGALY 11/30/2009  . OTHER DYSPNEA AND RESPIRATORY ABNORMALITIES 11/30/2009  . CAD 09/28/2009  . AORTIC REGURGITATION 09/28/2009  . Chronic diastolic heart failure ([place]) 09/28/2009  . 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 Osteoarthrosis involving multiple sites 10/26/2008   Laureen Abrahams, PT, DPT 04/20/2015 11:49 AM  [facility] [address] [place], [place], [address] Phone: [phone]   Fax:  [phone]  Name: Julie Graves MRN:  [id number] Date of Birth: 10-Jan-1941

## 2015-04-20 NOTE — Patient Instructions (Signed)
Heel Raises    Stand with support. With knees straight, raise heels off ground. Hold _1-2_ seconds. Relax for _1-2__ seconds. Repeat _20__ times. Do _2-3__ times a day.  Copyright  VHI. All rights reserved.   ABDUCTION: Standing (Active)    Stand, feet flat. Lift right leg out to side. Repeat to other side. Complete _2__ sets of _10__ repetitions. Perform _2-3__ sessions per day.  http://gtsc.exer.us/111   Copyright  VHI. All rights reserved.   EXTENSION: Standing (Active)    Stand, both feet flat. Draw right leg behind body as far as possible. Repeat with other leg. Complete _2__ sets of _10__ repetitions. Perform _2-3__ sessions per day.  http://gtsc.exer.us/77   Copyright  VHI. All rights reserved.    [facility] [address] [place] [address]  [phone] (office) [phone] (fax)

## 2015-04-27 ENCOUNTER — Ambulatory Visit: Payer: Medicare Other | Attending: Family Medicine | Admitting: Physical Therapy

## 2015-04-27 DIAGNOSIS — R29898 Other symptoms and signs involving the musculoskeletal system: Secondary | ICD-10-CM | POA: Diagnosis present

## 2015-04-27 DIAGNOSIS — R262 Difficulty in walking, not elsewhere classified: Secondary | ICD-10-CM | POA: Diagnosis present

## 2015-04-27 NOTE — Therapy (Signed)
[place], [place], [address] Phone: [phone]   Fax:  [phone]  Physical Therapy Treatment  Patient Details  Name: Julie Graves MRN: [id number] Date of Birth: August 01, 1940 No Data Recorded  Encounter Date: 04/27/2015      PT End of Session - 04/27/15 1139    Visit Number 5   Number of Visits 5   Date for PT Re-Evaluation 05/04/15   Authorization Type UHC Medicare   Authorization Time Period Gcode 10th visit; progress note by 10th visit   PT Start Time 1102   PT Stop Time 1140   PT Time Calculation (min) 38 min   Activity Tolerance Patient tolerated treatment well   Behavior During Therapy [facility] for tasks assessed/performed      Past Medical History  Diagnosis Date  . Frozen shoulder   . Obese   . HTN (hypertension)     ACEI cough  . HLD (hyperlipidemia)   . Palpitation     PACs noted on telemetry while in the hospital  . Nonischemic cardiomyopathy ([place])     Ech 3/11 difficult study, moderate aortic insuficiency noted. Left evntriculogram 3/11  . Osteoarthritis   . CAD (coronary artery disease)     nonobstructive let heart cath 3/11: 40% ostial D1, 30% mid CFX  . Aortic insufficiency     mild on 9/11 cath  . CHF (congestive heart failure)   . Pneumonia     hx  . Diabetes mellitus     no meds  . Anxiety     rx given recent not taken yet  . GERD (gastroesophageal reflux disease)     occ  . Stress incontinence   . Hx of cardiovascular stress test     Lexiscan Myoview (10/2013):  Fixed ant defect most c/w breast attenuation, cannot exclude apical infarct; no ischemia, EF 46%; Low Risk  . Colon polyps 02/01/2009     MULTIPLE FRAGMENTS OF TUBULAR ADENOMAS  . Anginal pain     "small pain?nerves in chest"started 10/05/13   . Anginal pain     cleared by cardiology 3/15    Past Surgical History  Procedure Laterality Date  . Abdominal hysterectomy    . Tubal ligation    .  Appendectomy    . Eye surgery    . Bladder neck suspension  85  . Knee arthroplasty Right 12/09/2013    Procedure: COMPUTER ASSISTED Right TOTAL KNEE ARTHROPLASTY;  Surgeon: Marybelle Killings, MD;  Location: [place];  Service: Orthopedics;  Laterality: Right;    There were no vitals filed for this visit.  Visit Diagnosis:  Weakness of right lower extremity  Difficulty walking      Subjective Assessment - 04/27/15 1106    Subjective States she doesn't consistently do exercises at home.  "I get lazy." Plans to d/c next week.  Didn't go to the gym as planned. Stated "I was tired."   Limitations Walking   Patient Stated Goals walk for exercise (prior to surgery able to walk 1 mile on track at [facility])   Currently in Pain? No/denies                         [facility] Adult PT Treatment/Exercise - 04/27/15 1109    Knee/Hip Exercises: Aerobic   Nustep L4 x 8 min   Knee/Hip Exercises: Standing   Heel Raises 2 sets;10 reps;Both   Hip Flexion Both;2 sets;10 reps;Knee bent  Hip Abduction Both;2 sets;10 reps   Hip Extension Both;2 sets;10 reps   Other Standing Knee Exercises tandem walking; amb on heels/toes with 1 UE support   Other Standing Knee Exercises sidestepping and backwards walking without UE support   Knee/Hip Exercises: Supine   Bridges Both;2 sets;10 reps   Straight Leg Raises Both;2 sets;10 reps                  PT Short Term Goals - 03/30/15 1052    PT SHORT TERM GOAL #1   Title all STGs=LTGs           PT Long Term Goals - 04/08/15 1153    PT LONG TERM GOAL #1   Title independent with HEP (05/04/15)   Time 5   Period Weeks   Status On-going   PT LONG TERM GOAL #2   Title report attendance at [employer] at least 2x/wk for return to community fitness (05/04/15)   Time 5   Period Weeks   Status On-going   PT LONG TERM GOAL #3   Title improve strength in RLE to at least 4/5 knee flex/ext for improved mobility (05/04/15)   Time 5   Period Weeks    Status On-going               Plan - 04/27/15 1140    Clinical Impression Statement Pt doing well with PT and HEP mostly established.  Pt ready for transition home and community programs.  Pt states history of limited compliance with HEP and educated on importance of continued exercise to improve and maintain strength.     PT Next Visit Plan add counter dynamic exercises; d/c   PT Home Exercise Plan straight leg raises, bridges, clamshells; standing calf raises, hip abdct and hip ext   Consulted and Agree with Plan of Care Patient        Problem List Patient Active Problem List   Diagnosis Date Noted  . Osteoarthritis of right knee 12/09/2013    Class: Diagnosis of  . Urge incontinence 04/02/2013  . Spinal stenosis, lumbar region, without neurogenic claudication 10/27/2012  . Spinal stenosis of lumbar region 10/01/2012  . Hyperlipidemia 05/27/2012  . Allergic rhinitis 09/26/2011  . Dizziness 01/23/2011  . Dyspnea 01/23/2011  . Type II diabetes mellitus ([place]) 06/27/2010  . Morbid obesity ([place]) 06/27/2010  . Coronary atherosclerosis 06/22/2010  . Hypertension, benign 06/22/2010  . Disorder of bone and cartilage 06/22/2010  . Primary cardiomyopathy ([place]) 03/23/2010  . HYPERLIPIDEMIA-MIXED 03/07/2010  . Essential hypertension 03/07/2010  . CARDIOMEGALY 11/30/2009  . OTHER DYSPNEA AND RESPIRATORY ABNORMALITIES 11/30/2009  . CAD 09/28/2009  . AORTIC REGURGITATION 09/28/2009  . Chronic diastolic heart failure ([place]) 09/28/2009  . 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 Osteoarthrosis involving multiple sites 10/26/2008   Laureen Abrahams, PT, DPT 04/27/2015 11:42 AM  [facility] [address] [place], [place], [address] Phone: [phone]   Fax:  [phone]  Name: SHARINA PETRE MRN: [id number] Date of Birth: December 10, 1940

## 2015-05-04 ENCOUNTER — Ambulatory Visit: Payer: Medicare Other | Admitting: Physical Therapy

## 2015-05-04 DIAGNOSIS — R29898 Other symptoms and signs involving the musculoskeletal system: Secondary | ICD-10-CM | POA: Diagnosis not present

## 2015-05-04 DIAGNOSIS — R262 Difficulty in walking, not elsewhere classified: Secondary | ICD-10-CM

## 2015-05-04 NOTE — Therapy (Signed)
[place], [place], [address] Phone: [phone]   Fax:  [phone]  Physical Therapy Treatment  Patient Details  Name: Julie Graves MRN: [id number] Date of Birth: 1941/06/08 Referring Provider: Rodell Perna, MD  Encounter Date: 05/04/2015      PT End of Session - 05/04/15 1304    Visit Number 6   Date for PT Re-Evaluation 05/04/15   PT Start Time 1101   PT Stop Time 1132   PT Time Calculation (min) 31 min   Activity Tolerance Patient tolerated treatment well   Behavior During Therapy [facility] for tasks assessed/performed      Past Medical History  Diagnosis Date  . Frozen shoulder   . Obese   . HTN (hypertension)     ACEI cough  . HLD (hyperlipidemia)   . Palpitation     PACs noted on telemetry while in the hospital  . Nonischemic cardiomyopathy ([place])     Ech 3/11 difficult study, moderate aortic insuficiency noted. Left evntriculogram 3/11  . Osteoarthritis   . CAD (coronary artery disease)     nonobstructive let heart cath 3/11: 40% ostial D1, 30% mid CFX  . Aortic insufficiency     mild on 9/11 cath  . CHF (congestive heart failure)   . Pneumonia     hx  . Diabetes mellitus     no meds  . Anxiety     rx given recent not taken yet  . GERD (gastroesophageal reflux disease)     occ  . Stress incontinence   . Hx of cardiovascular stress test     Lexiscan Myoview (10/2013):  Fixed ant defect most c/w breast attenuation, cannot exclude apical infarct; no ischemia, EF 46%; Low Risk  . Colon polyps 02/01/2009     MULTIPLE FRAGMENTS OF TUBULAR ADENOMAS  . Anginal pain     "small pain?nerves in chest"started 10/05/13   . Anginal pain     cleared by cardiology 3/15    Past Surgical History  Procedure Laterality Date  . Abdominal hysterectomy    . Tubal ligation    . Appendectomy    . Eye surgery    . Bladder neck suspension  85  . Knee arthroplasty Right 12/09/2013    Procedure:  COMPUTER ASSISTED Right TOTAL KNEE ARTHROPLASTY;  Surgeon: Marybelle Killings, MD;  Location: [place];  Service: Orthopedics;  Laterality: Right;    There were no vitals filed for this visit.  Visit Diagnosis:  Weakness of right lower extremity  Difficulty walking      Subjective Assessment - 05/04/15 1105    Subjective Reports she is doing exercises at home; when asked about continuing after d/c pt stated "I'm gonna try my best."   Patient Stated Goals walk for exercise (prior to surgery able to walk 1 mile on track at [facility])   Currently in Pain? No/denies            [facility] PT Assessment - 05/04/15 1110    Assessment   Referring Provider Rodell Perna, MD   Observation/Other Assessments   Focus on Therapeutic Outcomes (FOTO)  45 (55% limited)   Strength   Right Hip Flexion 3+/5   Right Hip Extension 3+/5   Right Hip ABduction 4/5   Left Hip Flexion 4/5   Left Hip Extension 4/5   Left Hip ABduction 4/5   Right Knee Flexion 4/5   Right Knee Extension 4/5   Left Knee Flexion 4/5  Left Knee Extension 5/5                     [facility] Adult PT Treatment/Exercise - May 18, 2015 1107    Knee/Hip Exercises: Aerobic   Nustep L6 x 8 min   Knee/Hip Exercises: Standing   Other Standing Knee Exercises tandem walking; amb on heels/toes with 1 UE support   Other Standing Knee Exercises sidestepping and backwards walking without UE support                PT Education - 05/18/15 1303    Education provided Yes   Education Details HEP   Person(s) Educated Patient   Methods Explanation;Demonstration;Handout   Comprehension Verbalized understanding;Need further instruction          PT Short Term Goals - 03/30/15 1052    PT SHORT TERM GOAL #1   Title all STGs=LTGs           PT Long Term Goals - 2015-05-18 1305    PT LONG TERM GOAL #1   Title independent with HEP (05/18/2015)   Status Achieved   PT LONG TERM GOAL #2   Title report attendance at [employer] at least  2x/wk for return to community fitness (May 18, 2015)   Baseline pt plans to attend beginning next week (05/09/15)   Status Not Met   PT LONG TERM GOAL #3   Title improve strength in RLE to at least 4/5 knee flex/ext for improved mobility (2015/05/18)   Status Achieved               Plan - 2015-05-18 1304    Clinical Impression Statement Pt ready for d/c and return to [employer] program.  Pt states she plans to return next week.  2 out of 3 LTGs met.   PT Next Visit Plan d/c PT          G-Codes - 05/18/15 1306    Functional Assessment Tool Used FOTO 55% limited   Functional Limitation Mobility: Walking and moving around   Mobility: Walking and Moving Around Goal Status [phone]) At least 40 percent but less than 60 percent impaired, limited or restricted   Mobility: Walking and Moving Around Discharge Status [phone]) At least 40 percent but less than 60 percent impaired, limited or restricted      Problem List Patient Active Problem List   Diagnosis Date Noted  . Osteoarthritis of right knee 12/09/2013    Class: Diagnosis of  . Urge incontinence 04/02/2013  . Spinal stenosis, lumbar region, without neurogenic claudication 10/27/2012  . Spinal stenosis of lumbar region 10/01/2012  . Hyperlipidemia 05/27/2012  . Allergic rhinitis 09/26/2011  . Dizziness 01/23/2011  . Dyspnea 01/23/2011  . Type II diabetes mellitus ([place]) 06/27/2010  . Morbid obesity ([place]) 06/27/2010  . Coronary atherosclerosis 06/22/2010  . Hypertension, benign 06/22/2010  . Disorder of bone and cartilage 06/22/2010  . Primary cardiomyopathy ([place]) 03/23/2010  . HYPERLIPIDEMIA-MIXED 03/07/2010  . Essential hypertension 03/07/2010  . CARDIOMEGALY 11/30/2009  . OTHER DYSPNEA AND RESPIRATORY ABNORMALITIES 11/30/2009  . CAD 09/28/2009  . AORTIC REGURGITATION 09/28/2009  . Chronic diastolic heart failure ([place]) 09/28/2009  . 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 Osteoarthrosis involving multiple sites  10/26/2008   Laureen Abrahams, PT, DPT 05/18/15 1:10 PM  [facility] [address] [place], [place], [address] Phone: [phone]   Fax:  [phone]  Name: Julie Graves MRN: [id number] Date of Birth: [birth year]    PHYSICAL THERAPY  DISCHARGE SUMMARY  Visits from Start of Care: 6  Current functional level related to goals / functional outcomes: See above; 2/3 LTGs met   Remaining deficits: Pt demonstrates some lower extremity weakness but overall improved from initial evaluation.  Pt with history of limited compliance with HEP despite education on importance of continued exercise.     Education / Equipment: HEP, community fitness  Plan: Patient agrees to discharge.  Patient goals were partially met. Patient is being discharged due to being pleased with the current functional level.  ?????   Laureen Abrahams, PT, DPT 05/04/2015 1:10 PM  [facility] [address] [place] [address]  [phone] (office) [phone] (fax)

## 2015-05-11 ENCOUNTER — Ambulatory Visit (AMBULATORY_SURGERY_CENTER): Payer: Self-pay

## 2015-05-11 VITALS — Ht 59.5 in | Wt 244.8 lb

## 2015-05-11 DIAGNOSIS — Z860100 Personal history of colon polyps, unspecified: Secondary | ICD-10-CM

## 2015-05-11 MED ORDER — SUPREP BOWEL PREP KIT 17.5-3.13-1.6 GM/177ML PO SOLN: 1.0000 | Freq: Once | ORAL | Status: DC

## 2015-05-11 NOTE — Progress Notes (Signed)
No allergies to eggs or soy No diet/weight loss meds No home oxygen No past problems with anesthesia  No internet right now

## 2015-05-16 ENCOUNTER — Telehealth: Payer: Self-pay | Admitting: Gastroenterology

## 2015-05-16 NOTE — Telephone Encounter (Signed)
Pt states her prep is 85$ and she cannot afford that . Asked about sample or changing her prep to something else  pt to pick up sample 4th floor desk  Medication Samples have been provided to the patient.  Drug name: suprep  Qty: 1  LOTNB:[id number]  Exp.Date: 5-18  The patient has been instructed regarding the correct time, dose, and frequency of taking this medication, including desired effects and most common side effects.   Hetty Ely Widener 11:25 AM 05/16/2015

## 2015-05-25 ENCOUNTER — Ambulatory Visit (AMBULATORY_SURGERY_CENTER): Payer: Medicare Other | Admitting: Gastroenterology

## 2015-05-25 ENCOUNTER — Encounter: Payer: Self-pay | Admitting: Gastroenterology

## 2015-05-25 VITALS — BP 136/55 | HR 62 | Temp 98.6°F | Resp 19 | Ht 59.5 in | Wt 244.0 lb

## 2015-05-25 DIAGNOSIS — Z86010 Personal history of colonic polyps: Secondary | ICD-10-CM | POA: Diagnosis not present

## 2015-05-25 DIAGNOSIS — D123 Benign neoplasm of transverse colon: Secondary | ICD-10-CM | POA: Diagnosis not present

## 2015-05-25 DIAGNOSIS — Z800 Family history of malignant neoplasm of digestive organs: Secondary | ICD-10-CM | POA: Diagnosis not present

## 2015-05-25 MED ORDER — SODIUM CHLORIDE 0.9 % IV SOLN: 500.0000 mL | INTRAVENOUS | Status: DC

## 2015-05-25 NOTE — Progress Notes (Signed)
Called to room to assist during endoscopic procedure.  Patient ID and intended procedure confirmed with present staff. Received instructions for my participation in the procedure from the performing physician.  

## 2015-05-25 NOTE — Patient Instructions (Signed)
YOU HAD AN ENDOSCOPIC PROCEDURE TODAY AT [facility]:   Refer to the procedure report that was given to you for any specific questions about what was found during the examination.  If the procedure report does not answer your questions, please call your gastroenterologist to clarify.  If you requested that your care partner not be given the details of your procedure findings, then the procedure report has been included in a sealed envelope for you to review at your convenience later.  YOU SHOULD EXPECT: Some feelings of bloating in the abdomen. Passage of more gas than usual.  Walking can help get rid of the air that was put into your GI tract during the procedure and reduce the bloating. If you had a lower endoscopy (such as a colonoscopy or flexible sigmoidoscopy) you may notice spotting of blood in your stool or on the toilet paper. If you underwent a bowel prep for your procedure, you may not have a normal bowel movement for a few days.  Please Note:  You might notice some irritation and congestion in your nose or some drainage.  This is from the oxygen used during your procedure.  There is no need for concern and it should clear up in a day or so.  SYMPTOMS TO REPORT IMMEDIATELY:   Following lower endoscopy (colonoscopy or flexible sigmoidoscopy):  Excessive amounts of blood in the stool  Significant tenderness or worsening of abdominal pains  Swelling of the abdomen that is new, acute  Fever of 100F or higher    For urgent or emergent issues, a gastroenterologist can be reached at any hour by calling [phone].   DIET: Your first meal following the procedure should be a small meal and then it is ok to progress to your normal diet. Heavy or fried foods are harder to digest and may make you feel nauseous or bloated.  Likewise, meals heavy in dairy and vegetables can increase bloating.  Drink plenty of fluids but you should avoid alcoholic beverages for 24  hours.  ACTIVITY:  You should plan to take it easy for the rest of today and you should NOT DRIVE or use heavy machinery until tomorrow (because of the sedation medicines used during the test).    FOLLOW UP: Our staff will call the number listed on your records the next business day following your procedure to check on you and address any questions or concerns that you may have regarding the information given to you following your procedure. If we do not reach you, we will leave a message.  However, if you are feeling well and you are not experiencing any problems, there is no need to return our call.  We will assume that you have returned to your regular daily activities without incident.  If any biopsies were taken you will be contacted by phone or by letter within the next 1-3 weeks.  Please call us at [phone] if you have not heard about the biopsies in 3 weeks.    SIGNATURES/CONFIDENTIALITY: You and/or your care partner have signed paperwork which will be entered into your electronic medical record.  These signatures attest to the fact that that the information above on your After Visit Summary has been reviewed and is understood.  Full responsibility of the confidentiality of this discharge information lies with you and/or your care-partner.   Hold Aspirin and all NSAIDS for 2 weeks, but resume remainder of medication. Information given on polyps, diverticulosis and high fiber diet.

## 2015-05-25 NOTE — Progress Notes (Signed)
To recovery, report to Brown, RN, VSS. 

## 2015-05-25 NOTE — Op Note (Signed)
[place]  [address] [place], [id number]   COLONOSCOPY PROCEDURE REPORT  PATIENT: Julie, Graves  MR#: [id number] BIRTHDATE: February 09, 1941 , 74  yrs. old GENDER: female ENDOSCOPIST: Ladene Artist, MD, [facility] PROCEDURE DATE:  05/25/2015 PROCEDURE:   Colonoscopy, surveillance and Colonoscopy with snare polypectomy First Screening Colonoscopy - Avg.  risk and is 50 yrs.  old or older - No.  Prior Negative Screening - Now for repeat screening. N/A  History of Adenoma - Now for follow-up colonoscopy & has been > or = to 3 yrs.  Yes hx of adenoma.  Has been 3 or more years since last colonoscopy.  Polyps removed today? Yes ASA CLASS:   Class III INDICATIONS:Surveillance due to prior colonic neoplasia, PH Colon Adenoma, and PH Colon or Rectal Adenocarcinoma. MEDICATIONS: Monitored anesthesia care and Propofol 400 mg IV DESCRIPTION OF PROCEDURE:   After the risks benefits and alternatives of the procedure were thoroughly explained, informed consent was obtained.  The digital rectal exam revealed no abnormalities of the rectum.   The LB [id number]-H190 [id number]  endoscope was introduced through the anus and advanced to the cecum, which was identified by both the appendix and ileocecal valve. No adverse events experienced.   The quality of the prep was good.  (Suprep was used)  The instrument was then slowly withdrawn as the colon was fully examined. Estimated blood loss is zero unless otherwise noted in this procedure report.    COLON FINDINGS: Two sessile polyps measuring 8 mm in size were found in the transverse colon.  Polypectomies were performed using snare cautery.  The resection was complete, the polyp tissue was completely retrieved and sent to histology.   Two sessile polyps measuring 6 mm in size were found in the transverse colon and at the hepatic flexure.  Polypectomies were performed with a cold snare.  The resection was complete, the polyp tissue was  completely retrieved and sent to histology.   There was moderate diverticulosis noted in the sigmoid colon and descending colon with associated colonic spasm, luminal narrowing and muscular hypertrophy.   There was mild diverticulosis noted in the transverse colon.   The examination was otherwise normal. Retroflexed views revealed no abnormalities. The time to cecum = 4.5 Withdrawal time = 25.2   The scope was withdrawn and the procedure completed. COMPLICATIONS: There were no immediate complications.  ENDOSCOPIC IMPRESSION: 1.   Two sessile polyps in the transverse colon; polypectomies performed using snare cautery 2.   Two sessile polyps in the transverse colon and at the hepatic flexure; polypectomies performed with a cold snare 3.   Moderate diverticulosis noted in the sigmoid colon and descending colon 4.   Mild diverticulosis in the transverse colon  RECOMMENDATIONS: 1.  Await pathology results 2.  Hold Aspirin and all other NSAIDS for 2 weeks. 3.  High fiber diet with liberal fluid intake. 4.  Given your age, you will not need another colonoscopy for colon cancer screening or polyp surveillance.  These types of tests usually stop around the age 80.  eSigned:  Ladene Artist, MD, [facility] 05/25/2015 9:15 AM   cc: Billey Chang, MD   PATIENT NAME:  Julie, Graves MR#: [id number]

## 2015-05-26 ENCOUNTER — Telehealth: Payer: Self-pay

## 2015-05-26 NOTE — Telephone Encounter (Signed)
  Follow up Call-  Call back number 05/25/2015  Post procedure Call Back phone  # [phone]  Permission to leave phone message Yes     Patient questions:  Do you have a fever, pain , or abdominal swelling? No. Pain Score  0 *  Have you tolerated food without any problems? Yes.    Have you been able to return to your normal activities? Yes.    Do you have any questions about your discharge instructions: Diet   No. Medications  No. Follow up visit  No.  Do you have questions or concerns about your Care? No.  Actions: * If pain score is 4 or above: No action needed, pain <4.

## 2015-06-07 ENCOUNTER — Encounter: Payer: Self-pay | Admitting: Gastroenterology

## 2015-09-23 ENCOUNTER — Other Ambulatory Visit: Payer: Self-pay

## 2015-09-23 DIAGNOSIS — Z1231 Encounter for screening mammogram for malignant neoplasm of breast: Secondary | ICD-10-CM

## 2015-10-11 ENCOUNTER — Ambulatory Visit
Admission: RE | Admit: 2015-10-11 | Discharge: 2015-10-11 | Disposition: A | Discharge status: Home / Self Care | Payer: Medicare Other | Source: Ambulatory Visit

## 2015-10-11 DIAGNOSIS — Z1231 Encounter for screening mammogram for malignant neoplasm of breast: Secondary | ICD-10-CM

## 2016-03-23 ENCOUNTER — Telehealth: Payer: Self-pay | Admitting: *Deleted

## 2016-03-23 NOTE — Telephone Encounter (Signed)
Spoke with patient and scheduled her for Oct 3 with Dr. Letta Pate for injection.

## 2016-03-23 NOTE — Telephone Encounter (Signed)
-----   Message ----- From: Charlett Blake, MD Sent: 03/22/2016   1:49 PM To: Caro Hight, RN Subject: FW: Referral on 03/21/2016                      Please have patient make appointment for trochanteric bursa injection. I last saw her in 2014, left side, no ultrasound

## 2016-03-27 ENCOUNTER — Encounter: Payer: Medicare Other | Attending: Physical Medicine & Rehabilitation

## 2016-03-27 ENCOUNTER — Ambulatory Visit (HOSPITAL_BASED_OUTPATIENT_CLINIC_OR_DEPARTMENT_OTHER): Payer: Medicare Other | Admitting: Physical Medicine & Rehabilitation

## 2016-03-27 ENCOUNTER — Encounter: Payer: Self-pay | Admitting: Physical Medicine & Rehabilitation

## 2016-03-27 VITALS — BP 133/64 | HR 50 | Resp 14

## 2016-03-27 DIAGNOSIS — M7062 Trochanteric bursitis, left hip: Secondary | ICD-10-CM

## 2016-03-27 NOTE — Patient Instructions (Signed)
We injected the left hip for bursitis. Celestone and lidocaine were injected. Please call back if you need another injection.

## 2016-03-27 NOTE — Progress Notes (Signed)
Left hip trochanteric bursa injection Indication trochanteric bursitis not good candidate for nonsteroidals secondary cardiac disease Informed consent was obtained at scarring risks and benefits of the procedure with the patient his include bleeding bruising and infection she elects to proceed and has given written consent. Patient placed in a right lateral decubitus position area marked and prepped with alcohol. 25-gauge 3.5" needle inserted to bone contact. After negative draw back for blood 1 cc of 40 mg mL Medrol and 4 cc 1% lidocaine injected. Patient tolerated procedure well. Post procedure instructions given

## 2016-09-25 ENCOUNTER — Other Ambulatory Visit: Payer: Self-pay | Admitting: Family Medicine

## 2016-09-25 DIAGNOSIS — Z1231 Encounter for screening mammogram for malignant neoplasm of breast: Secondary | ICD-10-CM

## 2016-10-16 ENCOUNTER — Ambulatory Visit: Payer: Medicare Other

## 2016-10-18 ENCOUNTER — Ambulatory Visit
Admission: RE | Admit: 2016-10-18 | Discharge: 2016-10-18 | Disposition: A | Discharge status: Home / Self Care | Payer: Medicare Other | Source: Ambulatory Visit | Attending: Family Medicine | Admitting: Family Medicine

## 2016-10-18 DIAGNOSIS — Z1231 Encounter for screening mammogram for malignant neoplasm of breast: Secondary | ICD-10-CM

## 2016-11-27 ENCOUNTER — Ambulatory Visit (INDEPENDENT_AMBULATORY_CARE_PROVIDER_SITE_OTHER): Payer: Medicare Other

## 2016-11-27 ENCOUNTER — Ambulatory Visit (INDEPENDENT_AMBULATORY_CARE_PROVIDER_SITE_OTHER): Payer: Medicare Other | Admitting: Orthopaedic Surgery

## 2016-11-27 ENCOUNTER — Encounter (INDEPENDENT_AMBULATORY_CARE_PROVIDER_SITE_OTHER): Payer: Self-pay | Admitting: Orthopaedic Surgery

## 2016-11-27 VITALS — BP 143/70 | HR 53 | Ht 60.0 in | Wt 210.0 lb

## 2016-11-27 DIAGNOSIS — M25561 Pain in right knee: Secondary | ICD-10-CM | POA: Diagnosis not present

## 2016-11-27 DIAGNOSIS — M25562 Pain in left knee: Secondary | ICD-10-CM

## 2016-11-27 DIAGNOSIS — Z96651 Presence of right artificial knee joint: Secondary | ICD-10-CM | POA: Diagnosis not present

## 2016-11-27 NOTE — Progress Notes (Addendum)
Office Visit Note   Patient: Julie Graves           Date of Birth: 1941-01-06           MRN: [id number] Visit Date: 11/27/2016              Requested by: Leamon Arnt, [place] [address] [place], [place] [address] PCP: Leamon Arnt, MD   Assessment & Plan: Visit Diagnoses:  1. Acute pain of both knees     Plan: Patient has satisfactory postop right total knee arthroplasty x-rays. She has residual quad weakness and at 5 feet tall 210 pounds has not been able to negotiate stairs due to residual quad weakness. We went over multiple exercises she needs to do to strengthen her quads. She can do isometrics on the left as well as straight leg raising using her first on her ankle as a counterweight for both legs to help strengthen her quads. We discussed ball squats 1 over appropriate technique also 1 over single leg knee squats on a step making sure she is hanging on to the walls or radial to help strengthen the right total knee arthroplasty quad which she never fully rehabbed. I discussed with her that if she did the exercises and went to the gym that she has available and did exercise we discussed she should get significant improvement in her ambulation and improvement in her right leg fatigue. I will recheck her in 2 months. Injection was performed in her left knee for osteoarthritis with mild synovitis. She's watched her sugars carefully in the past. She requested hydrocodone tablets which she states she does not take repetitively and I discussed with her I'm willing to give her 20 tablets with no refills but she needs to do her exercises to improve her gait and fix her quad weakness. She was in agreement to this.  Follow-Up Instructions: No Follow-up on file.   Orders:  Orders Placed This Encounter  Procedures  . XR Knee 1-2 Views Right  . XR Knee 1-2 Views Left   No orders of the defined types were placed in this encounter.     Procedures: Large Joint  Inj Date/Time: 11/27/2016 3:10 PM Performed by: Marybelle Killings Authorized by: Rodell Perna C   Consent Given by:  Patient Indications:  Pain and joint swelling Location:  Knee Site:  L knee Needle Size:  22 G Needle Length:  1.5 inches Approach:  Anterolateral Ultrasound Guidance: No   Fluoroscopic Guidance: No   Arthrogram: No   Medications:  1 mL lidocaine 1 %; 40 mg methylPREDNISolone acetate 40 MG/ML; 0.66 mL bupivacaine 0.25 % Aspiration Attempted: No   Patient tolerance:  Patient tolerated the procedure well with no immediate complications     Clinical Data: No additional findings.   Subjective: Chief Complaint  Patient presents with  . Left Knee - Pain  . Right Knee - Pain    HPI patient seen with bilateral knee complaints. She had a right total knee arthroplasty June 2015. She's had persistent right knee weakness at the end of the day. She's gone to physical therapy but has trouble using steps. Left knee has become increasingly painful bothers her more certain movements mostly on the lateral aspect of her knee. She carries a cane. She's taken hydrocodone with relief and states she is out of hydrocodone. She denies fever chills no right knee effusion.  Review of Systems 14 point review of systems updated since her  2015 surgery. She has hyperlipidemia, hypertension, coronary artery disease, aortic regurgitation. History of cardiomegaly. Obesity. Type 2 diabetes, lumbar spinal stenosis. Otherwise negative as it pertains to her history of present illness   Objective: Vital Signs: BP (!) 143/70   Pulse (!) 53   Ht 5' (1.524 m)   Wt 210 lb (95.3 kg)   BMI 41.01 kg/m   Physical Exam  Constitutional: She is oriented to person, place, and time. She appears well-developed.  HENT:  Head: Normocephalic.  Right Ear: External ear normal.  Left Ear: External ear normal.  Eyes: Pupils are equal, round, and reactive to light.  Neck: No tracheal deviation present. No  thyromegaly present.  Cardiovascular: Normal rate.   Pulmonary/Chest: Effort normal.  Abdominal: Soft.  Musculoskeletal:  Patient name delay without her cane. She is unable to single stance stepped on her right total knee arthroplasty side without hop. She has some weakness with manual resistive testing of her right quad and now 2-3 extension lag. Opposite left knee shows lateral joint line tenderness trace effusion collateral cruciate ligament exam is normal distal pulses are palpable. No pain with hip range of motion. No sciatic notch tenderness. Knee and ankle jerk are symmetrical. No plantar foot lesions.  Neurological: She is alert and oriented to person, place, and time.  Skin: Skin is warm and dry.  Psychiatric: She has a normal mood and affect. Her behavior is normal.    Ortho Exam  Specialty Comments:  No specialty comments available.  Imaging: No results found.   PMFS History: Patient Active Problem List   Diagnosis Date Noted  . Osteoarthritis of right knee 12/09/2013    Class: Diagnosis of  . Urge incontinence 04/02/2013  . Spinal stenosis, lumbar region, without neurogenic claudication 10/27/2012  . Spinal stenosis of lumbar region 10/01/2012  . Hyperlipidemia 05/27/2012  . Allergic rhinitis 09/26/2011  . Dizziness 01/23/2011  . Dyspnea 01/23/2011  . Type II diabetes mellitus ([place]) 06/27/2010  . Morbid obesity ([place]) 06/27/2010  . Coronary atherosclerosis 06/22/2010  . Hypertension, benign 06/22/2010  . Disorder of bone and cartilage 06/22/2010  . 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 HYPERLIPIDEMIA-MIXED 03/07/2010  . Essential hypertension 03/07/2010  . CARDIOMEGALY 11/30/2009  . OTHER DYSPNEA AND RESPIRATORY ABNORMALITIES 11/30/2009  . CAD 09/28/2009  . AORTIC REGURGITATION 09/28/2009  . Chronic diastolic heart failure ([place]) 09/28/2009  . 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 Osteoarthrosis involving multiple sites 10/26/2008   Past Medical  History:  Diagnosis Date  . Anginal pain ([place])    "small pain?nerves in chest"started 10/05/13   . Anginal pain ([place])    cleared by cardiology 3/15  . Anxiety    rx given recent not taken yet  . Aortic insufficiency    mild on 9/11 cath  . CAD (coronary artery disease)    nonobstructive let heart cath 3/11: 40% ostial D1, 30% mid CFX  . CHF (congestive heart failure) ([place])   . Colon polyps 02/01/2009    MULTIPLE FRAGMENTS OF TUBULAR ADENOMAS  . Diabetes mellitus    no meds  . Frozen shoulder   . GERD (gastroesophageal reflux disease)    occ  . History of blood transfusion    pregnancy  . HLD (hyperlipidemia)   . HTN (hypertension)    ACEI cough  . Hx of cardiovascular stress test    Lexiscan Myoview (10/2013):  Fixed ant defect most c/w breast attenuation, cannot exclude apical infarct; no ischemia, EF 46%; Low Risk  . Nonischemic cardiomyopathy ([place])  Ech 3/11 difficult study, moderate aortic insuficiency noted. Left evntriculogram 3/11  . Obese   . Osteoarthritis   . Palpitation    PACs noted on telemetry while in the hospital  . Pneumonia    hx  . Stress incontinence     Family History  Problem Relation Age of Onset  . Heart attack Father   . Heart attack Sister   . Stroke Mother 40  . Diabetes Sister   . Heart disease Sister   . Diabetes Brother   . Colon cancer Brother     Past Surgical History:  Procedure Laterality Date  . ABDOMINAL HYSTERECTOMY    . APPENDECTOMY    . BLADDER NECK SUSPENSION  85  . EYE SURGERY    . HAND SURGERY     right "had knots cut out"  . KNEE ARTHROPLASTY Right 12/09/2013   Procedure: COMPUTER ASSISTED Right TOTAL KNEE ARTHROPLASTY;  Surgeon: Marybelle Killings, MD;  Location: [facility];  Service: Orthopedics;  Laterality: Right;  . TUBAL LIGATION     Social History   Occupational History  . Not on file.   Social History Main Topics  . Smoking status: Former Smoker    Packs/day: 1.00    Years: 15.00    Types: Cigarettes    Quit  date: 10/10/1983  . Smokeless tobacco: Never Used     Comment: Quit 1970s  . Alcohol use No  . Drug use: No  . Sexual activity: Not on file

## 2016-12-03 MED ORDER — METHYLPREDNISOLONE ACETATE 40 MG/ML IJ SUSP
40.0000 mg | INTRAMUSCULAR | Status: AC | PRN
  Administered 2016-11-27: 40 mg via INTRA_ARTICULAR

## 2016-12-03 MED ORDER — LIDOCAINE HCL 1 % IJ SOLN
1.0000 mL | INTRAMUSCULAR | Status: AC | PRN
  Administered 2016-11-27: 1 mL

## 2016-12-03 MED ORDER — BUPIVACAINE HCL 0.25 % IJ SOLN
0.6600 mL | INTRAMUSCULAR | Status: AC | PRN
  Administered 2016-11-27: .66 mL via INTRA_ARTICULAR

## 2017-08-13 ENCOUNTER — Ambulatory Visit: Payer: Medicare Other | Admitting: Cardiology

## 2017-08-13 ENCOUNTER — Encounter: Payer: Self-pay | Admitting: Cardiology

## 2017-08-13 ENCOUNTER — Ambulatory Visit (HOSPITAL_BASED_OUTPATIENT_CLINIC_OR_DEPARTMENT_OTHER)
Admission: RE | Admit: 2017-08-13 | Discharge: 2017-08-13 | Disposition: A | Discharge status: Home / Self Care | Payer: Medicare Other | Source: Ambulatory Visit | Attending: Cardiology | Admitting: Cardiology

## 2017-08-13 ENCOUNTER — Other Ambulatory Visit: Payer: Self-pay

## 2017-08-13 ENCOUNTER — Ambulatory Visit (INDEPENDENT_AMBULATORY_CARE_PROVIDER_SITE_OTHER): Payer: Medicare Other | Admitting: Cardiology

## 2017-08-13 VITALS — BP 130/80 | Ht 61.0 in | Wt 227.0 lb

## 2017-08-13 DIAGNOSIS — I25111 Atherosclerotic heart disease of native coronary artery with angina pectoris with documented spasm: Secondary | ICD-10-CM | POA: Diagnosis not present

## 2017-08-13 DIAGNOSIS — I209 Angina pectoris, unspecified: Secondary | ICD-10-CM

## 2017-08-13 DIAGNOSIS — I10 Essential (primary) hypertension: Secondary | ICD-10-CM | POA: Diagnosis not present

## 2017-08-13 DIAGNOSIS — E1100 Type 2 diabetes mellitus with hyperosmolarity without nonketotic hyperglycemic-hyperosmolar coma (NKHHC): Secondary | ICD-10-CM

## 2017-08-13 DIAGNOSIS — E782 Mixed hyperlipidemia: Secondary | ICD-10-CM | POA: Insufficient documentation

## 2017-08-13 DIAGNOSIS — I2510 Atherosclerotic heart disease of native coronary artery without angina pectoris: Secondary | ICD-10-CM | POA: Insufficient documentation

## 2017-08-13 DIAGNOSIS — R079 Chest pain, unspecified: Secondary | ICD-10-CM | POA: Insufficient documentation

## 2017-08-13 MED ORDER — PREDNISONE 50 MG PO TABS: 50.0000 mg | ORAL_TABLET | Freq: Four times a day (QID) | ORAL | 0 refills | Status: DC

## 2017-08-13 MED ORDER — DIPHENHYDRAMINE HCL 50 MG PO CAPS: ORAL_CAPSULE | ORAL | 0 refills | Status: DC

## 2017-08-13 MED ORDER — NITROGLYCERIN 0.4 MG SL SUBL: 0.4000 mg | SUBLINGUAL_TABLET | SUBLINGUAL | 6 refills | Status: DC | PRN

## 2017-08-13 NOTE — Progress Notes (Signed)
Cardiology Office Note:    Date:  08/13/2017   ID:  Julie Graves, DOB 1941-01-10, MRN [id number]  PCP:  Julie Arnt, MD  Cardiologist:  Julie Lindau, MD   Referring MD: Julie Arnt, MD    ASSESSMENT:    1. Angina pectoris ([place])   2. Essential hypertension   3. Type 2 diabetes mellitus with hyperosmolarity without coma, without long-term current use of insulin ([place])   4. Morbid obesity ([place])   5. Mixed dyslipidemia    PLAN:    In order of problems listed above:  1. Secondary prevention stressed with the patient.  Importance of compliance with diet and medications stressed and she vocalized understanding.  There is documentation of nonobstructive disease by coronary angiography in the remote past.  I do not have those details. 2. In view of the patient's symptoms, I discussed with the patient options for evaluation. Invasive and noninvasive options were given to the patient. I discussed stress testing and coronary angiography and left heart catheterization at length. Benefits, pros and cons of each approach were discussed at length. Patient had multiple questions which were answered to the patient's satisfaction. Patient opted for invasive evaluation and we will set up for coronary angiography and left heart catheterization. Further recommendations will be made based on the findings with coronary angiography. In the interim if the patient has any significant symptoms in hospital to the nearest emergency room. 3. Sublingual nitroglycerin prescription was sent, its protocol and 911 protocol explained and the patient vocalized understanding questions were answered to the patient's satisfaction 4. Her blood pressure is stable diet was discussed for diabetes mellitus dyslipidemia and obesity and the risks of obesity explained and she vocalized understanding. 5. She has allergy to shellfish and will be prepped appropriately.   Medication Adjustments/Labs and Tests  Ordered: Current medicines are reviewed at length with the patient today.  Concerns regarding medicines are outlined above.  Orders Placed This Encounter  Procedures  . DG Chest 2 View  . CBC with Differential/Platelet  . INR/PT   Meds ordered this encounter  Medications  . nitroGLYCERIN (NITROSTAT) 0.4 MG SL tablet    Sig: Place 1 tablet (0.4 mg total) under the tongue every 5 (five) minutes as needed.    Dispense:  11 tablet    Refill:  6     History of Present Illness:    Julie Graves is a 77 y.o. female who is being seen today for the evaluation of chest tightness at the request of Julie Arnt, MD.  Patient mentions to me that she has been diagnosed with cardiomyopathy in the past.  She has history of essential hypertension, dyslipidemia and diabetes mellitus and is morbidly obese.  She mentions to me that she has substernal chest tightness whenever she is stressed out.  This is no radiation to the neck or to the arms.  She feels like somebody sitting on her chest.  When she relaxes she feels better.  At the time of my evaluation, the patient is alert awake oriented and in no distress.  He does not carry nitroglycerin with her.  No history of syncope.  Past Medical History:  Diagnosis Date  . Anginal pain ([place])    "small pain?nerves in chest"started 10/05/13   . Anginal pain ([place])    cleared by cardiology 3/15  . Anxiety    rx given recent not taken yet  . Aortic insufficiency    mild on 9/11 cath  .  CAD (coronary artery disease)    nonobstructive let heart cath 3/11: 40% ostial D1, 30% mid CFX  . CHF (congestive heart failure) ([place])   . Colon polyps 02/01/2009    MULTIPLE FRAGMENTS OF TUBULAR ADENOMAS  . Diabetes mellitus    no meds  . Frozen shoulder   . GERD (gastroesophageal reflux disease)    occ  . History of blood transfusion    pregnancy  . HLD (hyperlipidemia)   . HTN (hypertension)    ACEI cough  . Hx of cardiovascular stress test    Lexiscan  Myoview (10/2013):  Fixed ant defect most c/w breast attenuation, cannot exclude apical infarct; no ischemia, EF 46%; Low Risk  . Nonischemic cardiomyopathy ([place])    Ech 3/11 difficult study, moderate aortic insuficiency noted. Left evntriculogram 3/11  . Obese   . Osteoarthritis   . Palpitation    PACs noted on telemetry while in the hospital  . Pneumonia    hx  . Stress incontinence     Past Surgical History:  Procedure Laterality Date  . ABDOMINAL HYSTERECTOMY    . APPENDECTOMY    . BLADDER NECK SUSPENSION  85  . EYE SURGERY    . HAND SURGERY     right "had knots cut out"  . KNEE ARTHROPLASTY Right 12/09/2013   Procedure: COMPUTER ASSISTED Right TOTAL KNEE ARTHROPLASTY;  Surgeon: Julie Killings, MD;  Location: [facility];  Service: Orthopedics;  Laterality: Right;  . TUBAL LIGATION      Current Medications: Current Meds  Medication Sig  . acetaminophen (TYLENOL) 500 MG tablet Take by mouth.  Marland Kitchen alendronate (FOSAMAX) 70 MG tablet Take 70 mg by mouth every 7 (seven) days. Take with a full glass of water on an empty stomach. Take on mondays  . aspirin 81 MG tablet Take 81 mg by mouth every evening.   . calcium carbonate (OS-CAL) 600 MG TABS Take 600 mg by mouth 2 (two) times daily with a meal.   . carvedilol (COREG) 6.25 MG tablet Take 6.25 mg by mouth 2 (two) times daily.    . cetirizine (ZYRTEC) 10 MG tablet Take by mouth.  . Cholecalciferol (VITAMIN D) 1000 UNITS capsule Take 2,000 Units by mouth daily.   . Cyanocobalamin (VITAMIN B 12 PO) Take by mouth. [phone]  . FLUZONE HIGH-DOSE 0.5 ML injection inject 0.5 milliliter intramuscularly  . furosemide (LASIX) 20 MG tablet Take 20 mg by mouth every morning.   Marland Kitchen HYDROcodone-acetaminophen (NORCO/VICODIN) 5-325 MG tablet Take 1 tablet by mouth every 6 (six) hours as needed for moderate pain.  Marland Kitchen losartan (COZAAR) 25 MG tablet Take 25 mg by mouth every morning.   . metFORMIN (GLUCOPHAGE-XR) 500 MG 24 hr tablet take 1 tablet by mouth once  daily WITH BREAKFAST  . potassium chloride SA (KLOR-CON M20) 20 MEQ tablet Take 20 mEq by mouth every morning.   . simvastatin (ZOCOR) 40 MG tablet Take 40 mg by mouth at bedtime.       Allergies:   Shellfish allergy; Iodine; Lisinopril; Tramadol; and Penicillins   Social History   Socioeconomic History  . Marital status: Married    Spouse name: None  . Number of children: None  . Years of education: None  . Highest education level: None  Social Needs  . Financial resource strain: None  . Food insecurity - worry: None  . Food insecurity - inability: None  . Transportation needs - medical: None  . Transportation needs - non-medical: None  Occupational  History  . None  Tobacco Use  . Smoking status: Former Smoker    Packs/day: 1.00    Years: 15.00    Pack years: 15.00    Types: Cigarettes    Last attempt to quit: 10/10/1983    Years since quitting: 33.8  . Smokeless tobacco: Never Used  . Tobacco comment: Quit 1970s  Substance and Sexual Activity  . Alcohol use: No    Alcohol/week: 0.0 oz  . Drug use: No  . Sexual activity: None  Other Topics Concern  . None  Social History Narrative  . None     Family History: The patient's family history includes Colon cancer in her brother; Diabetes in her brother and sister; Heart attack in her father and sister; Heart disease in her sister; Stroke (age of onset: 66) in her mother.  ROS:   Please see the history of present illness.    All other systems reviewed and are negative.  EKGs/Labs/Other Studies Reviewed:    The following studies were reviewed today: EKG done today reveals sinus rhythm, left axis deviation, left ventricular hypertrophy and nonspecific ST-T changes.   Recent Labs: No results found for requested labs within last 8760 hours.  Recent Lipid Panel    Component Value Date/Time   CHOL 140 07/09/2014 1637   TRIG 243 (H) 07/09/2014 1637   HDL 55 07/09/2014 1637   CHOLHDL 2.5 07/09/2014 1637   VLDL 49  (H) 07/09/2014 1637   LDLCALC 36 07/09/2014 1637    Physical Exam:    VS:  BP 130/80 (BP Location: Left Arm, Patient Position: Sitting, Cuff Size: Normal)   Ht 5\' 1"  (1.549 m)   Wt 227 lb (103 kg)   SpO2 99%   BMI 42.89 kg/m     Wt Readings from Last 3 Encounters:  08/13/17 227 lb (103 kg)  11/27/16 210 lb (95.3 kg)  05/25/15 244 lb (110.7 kg)     GEN: Patient is in no acute distress HEENT: Normal NECK: No JVD; No carotid bruits LYMPHATICS: No lymphadenopathy CARDIAC: S1 S2 regular, 2/6 systolic murmur at the apex. RESPIRATORY:  Clear to auscultation without rales, wheezing or rhonchi  ABDOMEN: Soft, non-tender, non-distended MUSCULOSKELETAL:  No edema; No deformity  SKIN: Warm and dry NEUROLOGIC:  Alert and oriented x 3 PSYCHIATRIC:  Normal affect    Signed, Julie Lindau, MD  08/13/2017 3:59 PM    [facility]

## 2017-08-13 NOTE — Patient Instructions (Addendum)
Medication Instructions:  Your physician has recommended you make the following change in your medication:  START Nitroglycerin 0.4 mg sublingual (under your tongue) as needed for chest pain. If experiencing chest pain, stop what you are doing and sit down. Take 1 nitroglycerin and wait 5 minutes. If chest pain continues, take another nitroglycerin and wait 5 minutes. If chest pain does not subside, take 1 more nitroglycerin and dial 911. You make take a total of 3 nitroglycerin in a 15 minute time frame.  START bendaryl 50 mg once with last dose of prednisone just before arriving to the hospital START prednisone 50 mg every 6 hours for 3 doses; please ensure that the last dose is taken with the benadryl before arriving to the hospital  Labwork: Your physician recommends that you have the following labs drawn: CBC and INR  Testing/Procedures: A chest x-ray takes a picture of the organs and structures inside the chest, including the heart, lungs, and blood vessels. This test can show several things, including, whether the heart is enlarges; whether fluid is building up in the lungs; and whether pacemaker / defibrillator leads are still in place.    [facility] [address], [place] [address] Dept: [phone] Loc: [phone]  Julie Graves  08/13/2017  You are scheduled for a Cardiac Catheterization on Thursday, February 21 with Dr. Larae Graves.  1. Please arrive at the [facility] (Main Entrance A) at [place]: [address] [place], [place] at 8:00 AM (two hours before your procedure to ensure your preparation). Free valet parking service is available.   Special note: Every effort is made to have your procedure done on time. Please understand that emergencies sometimes delay scheduled procedures.  2. Diet: Do not eat or drink anything after midnight prior  to your procedure except sips of water to take medications.  3. Labs: Done on 02/19  4. Medication instructions in preparation for your procedure:  *For reference purposes while preparing patient instructions.   Delete this med list prior to printing instructions for patient.*  Please do not take your lasix (furosemide) the morning of the procedure  Stop taking, Glucophage (Metformin) on Wednesday, February 20. Please hold this medication for 48 hours after.   On the morning of your procedure, take your Aspirin and any morning medicines NOT listed above.  You may use sips of water.  5. Plan for one night stay--bring personal belongings. 6. Bring a current list of your medications and current insurance cards. 7. You MUST have a responsible person to drive you home. 8. Someone MUST be with you the first 24 hours after you arrive home or your discharge will be delayed. 9. Please wear clothes that are easy to get on and off and wear slip-on shoes.  Thank you for allowing [place] to care for you!   -- [facility]    Follow-Up: Your physician recommends that you schedule a follow-up appointment in: 1 month  Any Other Special Instructions Will Be Listed Below (If Applicable).     If you need a refill on your cardiac medications before your next appointment, please call your pharmacy.   [place], RN, BSN    Coronary Angiogram With Stent Coronary angiogram with stent placement is a procedure to widen or open a narrow blood vessel of the heart (coronary artery). Arteries may become blocked by  cholesterol buildup (plaques) in the lining of the wall. When a coronary artery becomes partially blocked, blood flow to that area decreases. This may lead to chest pain or a heart attack (myocardial infarction). A stent is a small piece of metal that looks like mesh or a spring. Stent placement may be done as treatment for a heart attack or right  after a coronary angiogram in which a blocked artery is found. Let your health care provider know about:  Any allergies you have.  All medicines you are taking, including vitamins, herbs, eye drops, creams, and over-the-counter medicines.  Any problems you or family members have had with anesthetic medicines.  Any blood disorders you have.  Any surgeries you have had.  Any medical conditions you have.  Whether you are pregnant or may be pregnant. What are the risks? Generally, this is a safe procedure. However, problems may occur, including:  Damage to the heart or its blood vessels.  A return of blockage.  Bleeding, infection, or bruising at the insertion site.  A collection of blood under the skin (hematoma) at the insertion site.  A blood clot in another part of the body.  Kidney injury.  Allergic reaction to the dye or contrast that is used.  Bleeding into the abdomen (retroperitoneal bleeding).  What happens before the procedure? Staying hydrated Follow instructions from your health care provider about hydration, which may include:  Up to 2 hours before the procedure - you may continue to drink clear liquids, such as water, clear fruit juice, black coffee, and plain tea.  Eating and drinking restrictions Follow instructions from your health care provider about eating and drinking, which may include:  8 hours before the procedure - stop eating heavy meals or foods such as meat, fried foods, or fatty foods.  6 hours before the procedure - stop eating light meals or foods, such as toast or cereal.  2 hours before the procedure - stop drinking clear liquids.  Ask your health care provider about:  Changing or stopping your regular medicines. This is especially important if you are taking diabetes medicines or blood thinners.  Taking medicines such as ibuprofen. These medicines can thin your blood. Do not take these medicines before your procedure if your health  care provider instructs you not to. Generally, aspirin is recommended before a procedure of passing a small, thin tube (catheter) through a blood vessel and into the heart (cardiac catheterization).  What happens during the procedure?  An IV tube will be inserted into one of your veins.  You will be given one or more of the following: ? A medicine to help you relax (sedative). ? A medicine to numb the area where the catheter will be inserted into an artery (local anesthetic).  To reduce your risk of infection: ? Your health care team will wash or sanitize their hands. ? Your skin will be washed with soap. ? Hair may be removed from the area where the catheter will be inserted.  Using a guide wire, the catheter will be inserted into an artery. The location may be in your groin, in your wrist, or in the fold of your arm (near your elbow).  A type of X-ray (fluoroscopy) will be used to help guide the catheter to the opening of the arteries in the heart.  A dye will be injected into the catheter, and X-rays will be taken. The dye will help to show where any narrowing or blockages are located in the arteries.  A tiny wire will be guided to the blocked spot, and a balloon will be inflated to make the artery wider.  The stent will be expanded and will crush the plaques into the wall of the vessel. The stent will hold the area open and improve the blood flow. Most stents have a drug coating to reduce the risk of the stent narrowing over time.  The artery may be made wider using a drill, laser, or other tools to remove plaques.  When the blood flow is better, the catheter will be removed. The lining of the artery will grow over the stent, which stays where it was placed. This procedure may vary among health care providers and hospitals. What happens after the procedure?  If the procedure is done through the leg, you will be kept in bed lying flat for about 6 hours. You will be instructed to not  bend and not cross your legs.  The insertion site will be checked frequently.  The pulse in your foot or wrist will be checked frequently.  You may have additional blood tests, X-rays, and a test that records the electrical activity of your heart (electrocardiogram, or ECG). This information is not intended to replace advice given to you by your health care provider. Make sure you discuss any questions you have with your health care provider. Document Released: 12/16/2002 Document Revised: 02/09/2016 Document Reviewed: 01/15/2016 Elsevier Interactive Patient Education  [employer].

## 2017-08-13 NOTE — Addendum Note (Signed)
Addended by: Mattie Marlin on: 08/13/2017 04:31 PM   Modules accepted: Orders

## 2017-08-13 NOTE — H&P (View-Only) (Signed)
Cardiology Office Note:    Date:  08/13/2017   ID:  SOSIE GATO, DOB September 21, 1940, MRN [id number]  PCP:  Leamon Arnt, MD  Cardiologist:  Jenean Lindau, MD   Referring MD: Leamon Arnt, MD    ASSESSMENT:    1. Angina pectoris ([place])   2. Essential hypertension   3. Type 2 diabetes mellitus with hyperosmolarity without coma, without long-term current use of insulin ([place])   4. Morbid obesity ([place])   5. Mixed dyslipidemia    PLAN:    In order of problems listed above:  1. Secondary prevention stressed with the patient.  Importance of compliance with diet and medications stressed and she vocalized understanding.  There is documentation of nonobstructive disease by coronary angiography in the remote past.  I do not have those details. 2. In view of the patient's symptoms, I discussed with the patient options for evaluation. Invasive and noninvasive options were given to the patient. I discussed stress testing and coronary angiography and left heart catheterization at length. Benefits, pros and cons of each approach were discussed at length. Patient had multiple questions which were answered to the patient's satisfaction. Patient opted for invasive evaluation and we will set up for coronary angiography and left heart catheterization. Further recommendations will be made based on the findings with coronary angiography. In the interim if the patient has any significant symptoms in hospital to the nearest emergency room. 3. Sublingual nitroglycerin prescription was sent, its protocol and 911 protocol explained and the patient vocalized understanding questions were answered to the patient's satisfaction 4. Her blood pressure is stable diet was discussed for diabetes mellitus dyslipidemia and obesity and the risks of obesity explained and she vocalized understanding. 5. She has allergy to shellfish and will be prepped appropriately.   Medication Adjustments/Labs and Tests  Ordered: Current medicines are reviewed at length with the patient today.  Concerns regarding medicines are outlined above.  Orders Placed This Encounter  Procedures  . DG Chest 2 View  . CBC with Differential/Platelet  . INR/PT   Meds ordered this encounter  Medications  . nitroGLYCERIN (NITROSTAT) 0.4 MG SL tablet    Sig: Place 1 tablet (0.4 mg total) under the tongue every 5 (five) minutes as needed.    Dispense:  11 tablet    Refill:  6     History of Present Illness:    Julie Graves is a 77 y.o. female who is being seen today for the evaluation of chest tightness at the request of Leamon Arnt, MD.  Patient mentions to me that she has been diagnosed with cardiomyopathy in the past.  She has history of essential hypertension, dyslipidemia and diabetes mellitus and is morbidly obese.  She mentions to me that she has substernal chest tightness whenever she is stressed out.  This is no radiation to the neck or to the arms.  She feels like somebody sitting on her chest.  When she relaxes she feels better.  At the time of my evaluation, the patient is alert awake oriented and in no distress.  He does not carry nitroglycerin with her.  No history of syncope.  Past Medical History:  Diagnosis Date  . Anginal pain ([place])    "small pain?nerves in chest"started 10/05/13   . Anginal pain ([place])    cleared by cardiology 3/15  . Anxiety    rx given recent not taken yet  . Aortic insufficiency    mild on 9/11 cath  .  CAD (coronary artery disease)    nonobstructive let heart cath 3/11: 40% ostial D1, 30% mid CFX  . CHF (congestive heart failure) ([place])   . Colon polyps 02/01/2009    MULTIPLE FRAGMENTS OF TUBULAR ADENOMAS  . Diabetes mellitus    no meds  . Frozen shoulder   . GERD (gastroesophageal reflux disease)    occ  . History of blood transfusion    pregnancy  . HLD (hyperlipidemia)   . HTN (hypertension)    ACEI cough  . Hx of cardiovascular stress test    Lexiscan  Myoview (10/2013):  Fixed ant defect most c/w breast attenuation, cannot exclude apical infarct; no ischemia, EF 46%; Low Risk  . Nonischemic cardiomyopathy ([place])    Ech 3/11 difficult study, moderate aortic insuficiency noted. Left evntriculogram 3/11  . Obese   . Osteoarthritis   . Palpitation    PACs noted on telemetry while in the hospital  . Pneumonia    hx  . Stress incontinence     Past Surgical History:  Procedure Laterality Date  . ABDOMINAL HYSTERECTOMY    . APPENDECTOMY    . BLADDER NECK SUSPENSION  85  . EYE SURGERY    . HAND SURGERY     right "had knots cut out"  . KNEE ARTHROPLASTY Right 12/09/2013   Procedure: COMPUTER ASSISTED Right TOTAL KNEE ARTHROPLASTY;  Surgeon: Marybelle Killings, MD;  Location: [facility];  Service: Orthopedics;  Laterality: Right;  . TUBAL LIGATION      Current Medications: Current Meds  Medication Sig  . acetaminophen (TYLENOL) 500 MG tablet Take by mouth.  Marland Kitchen alendronate (FOSAMAX) 70 MG tablet Take 70 mg by mouth every 7 (seven) days. Take with a full glass of water on an empty stomach. Take on mondays  . aspirin 81 MG tablet Take 81 mg by mouth every evening.   . calcium carbonate (OS-CAL) 600 MG TABS Take 600 mg by mouth 2 (two) times daily with a meal.   . carvedilol (COREG) 6.25 MG tablet Take 6.25 mg by mouth 2 (two) times daily.    . cetirizine (ZYRTEC) 10 MG tablet Take by mouth.  . Cholecalciferol (VITAMIN D) 1000 UNITS capsule Take 2,000 Units by mouth daily.   . Cyanocobalamin (VITAMIN B 12 PO) Take by mouth. [phone]  . FLUZONE HIGH-DOSE 0.5 ML injection inject 0.5 milliliter intramuscularly  . furosemide (LASIX) 20 MG tablet Take 20 mg by mouth every morning.   Marland Kitchen HYDROcodone-acetaminophen (NORCO/VICODIN) 5-325 MG tablet Take 1 tablet by mouth every 6 (six) hours as needed for moderate pain.  Marland Kitchen losartan (COZAAR) 25 MG tablet Take 25 mg by mouth every morning.   . metFORMIN (GLUCOPHAGE-XR) 500 MG 24 hr tablet take 1 tablet by mouth once  daily WITH BREAKFAST  . potassium chloride SA (KLOR-CON M20) 20 MEQ tablet Take 20 mEq by mouth every morning.   . simvastatin (ZOCOR) 40 MG tablet Take 40 mg by mouth at bedtime.       Allergies:   Shellfish allergy; Iodine; Lisinopril; Tramadol; and Penicillins   Social History   Socioeconomic History  . Marital status: Married    Spouse name: None  . Number of children: None  . Years of education: None  . Highest education level: None  Social Needs  . Financial resource strain: None  . Food insecurity - worry: None  . Food insecurity - inability: None  . Transportation needs - medical: None  . Transportation needs - non-medical: None  Occupational  History  . None  Tobacco Use  . Smoking status: Former Smoker    Packs/day: 1.00    Years: 15.00    Pack years: 15.00    Types: Cigarettes    Last attempt to quit: 10/10/1983    Years since quitting: 33.8  . Smokeless tobacco: Never Used  . Tobacco comment: Quit 1970s  Substance and Sexual Activity  . Alcohol use: No    Alcohol/week: 0.0 oz  . Drug use: No  . Sexual activity: None  Other Topics Concern  . None  Social History Narrative  . None     Family History: The patient's family history includes Colon cancer in her brother; Diabetes in her brother and sister; Heart attack in her father and sister; Heart disease in her sister; Stroke (age of onset: 59) in her mother.  ROS:   Please see the history of present illness.    All other systems reviewed and are negative.  EKGs/Labs/Other Studies Reviewed:    The following studies were reviewed today: EKG done today reveals sinus rhythm, left axis deviation, left ventricular hypertrophy and nonspecific ST-T changes.   Recent Labs: No results found for requested labs within last 8760 hours.  Recent Lipid Panel    Component Value Date/Time   CHOL 140 07/09/2014 1637   TRIG 243 (H) 07/09/2014 1637   HDL 55 07/09/2014 1637   CHOLHDL 2.5 07/09/2014 1637   VLDL 49  (H) 07/09/2014 1637   LDLCALC 36 07/09/2014 1637    Physical Exam:    VS:  BP 130/80 (BP Location: Left Arm, Patient Position: Sitting, Cuff Size: Normal)   Ht 5\' 1"  (1.549 m)   Wt 227 lb (103 kg)   SpO2 99%   BMI 42.89 kg/m     Wt Readings from Last 3 Encounters:  08/13/17 227 lb (103 kg)  11/27/16 210 lb (95.3 kg)  05/25/15 244 lb (110.7 kg)     GEN: Patient is in no acute distress HEENT: Normal NECK: No JVD; No carotid bruits LYMPHATICS: No lymphadenopathy CARDIAC: S1 S2 regular, 2/6 systolic murmur at the apex. RESPIRATORY:  Clear to auscultation without rales, wheezing or rhonchi  ABDOMEN: Soft, non-tender, non-distended MUSCULOSKELETAL:  No edema; No deformity  SKIN: Warm and dry NEUROLOGIC:  Alert and oriented x 3 PSYCHIATRIC:  Normal affect    Signed, Jenean Lindau, MD  08/13/2017 3:59 PM    [facility]

## 2017-08-14 LAB — CBC WITH DIFFERENTIAL/PLATELET
BASOS ABS: 0 10*3/uL (ref 0.0–0.2)
BASOS: 0 %
EOS (ABSOLUTE): 0.2 10*3/uL (ref 0.0–0.4)
Eos: 3 %
Hematocrit: 40.6 % (ref 34.0–46.6)
Hemoglobin: 13.1 g/dL (ref 11.1–15.9)
Immature Grans (Abs): 0 10*3/uL (ref 0.0–0.1)
Immature Granulocytes: 0 %
LYMPHS: 27 %
Lymphocytes Absolute: 2 10*3/uL (ref 0.7–3.1)
MCH: 30.6 pg (ref 26.6–33.0)
MCHC: 32.3 g/dL (ref 31.5–35.7)
MCV: 95 fL (ref 79–97)
MONOS ABS: 0.5 10*3/uL (ref 0.1–0.9)
Monocytes: 7 %
Neutrophils Absolute: 4.5 10*3/uL (ref 1.4–7.0)
Neutrophils: 63 %
PLATELETS: 281 10*3/uL (ref 150–379)
RBC: 4.28 x10E6/uL (ref 3.77–5.28)
RDW: 13.5 % (ref 12.3–15.4)
WBC: 7.3 10*3/uL (ref 3.4–10.8)

## 2017-08-14 LAB — PROTIME-INR
INR: 1 (ref 0.8–1.2)
Prothrombin Time: 10.5 s (ref 9.1–12.0)

## 2017-08-15 ENCOUNTER — Encounter (HOSPITAL_COMMUNITY)
Admission: RE | Disposition: A | Discharge status: Home / Self Care | Payer: Self-pay | Source: Ambulatory Visit | Attending: Interventional Cardiology

## 2017-08-15 ENCOUNTER — Ambulatory Visit (HOSPITAL_COMMUNITY)
Admission: RE | Admit: 2017-08-15 | Discharge: 2017-08-15 | Disposition: A | Discharge status: Home / Self Care | Payer: Medicare Other | Source: Ambulatory Visit | Attending: Interventional Cardiology | Admitting: Interventional Cardiology

## 2017-08-15 DIAGNOSIS — Z7982 Long term (current) use of aspirin: Secondary | ICD-10-CM | POA: Diagnosis not present

## 2017-08-15 DIAGNOSIS — E782 Mixed hyperlipidemia: Secondary | ICD-10-CM | POA: Diagnosis not present

## 2017-08-15 DIAGNOSIS — I509 Heart failure, unspecified: Secondary | ICD-10-CM | POA: Diagnosis not present

## 2017-08-15 DIAGNOSIS — I2510 Atherosclerotic heart disease of native coronary artery without angina pectoris: Secondary | ICD-10-CM | POA: Diagnosis not present

## 2017-08-15 DIAGNOSIS — E785 Hyperlipidemia, unspecified: Secondary | ICD-10-CM | POA: Insufficient documentation

## 2017-08-15 DIAGNOSIS — I110 Hypertensive heart disease with heart failure: Secondary | ICD-10-CM | POA: Insufficient documentation

## 2017-08-15 DIAGNOSIS — Z87891 Personal history of nicotine dependence: Secondary | ICD-10-CM | POA: Insufficient documentation

## 2017-08-15 DIAGNOSIS — R072 Precordial pain: Secondary | ICD-10-CM | POA: Diagnosis present

## 2017-08-15 DIAGNOSIS — Z6841 Body Mass Index (BMI) 40.0 and over, adult: Secondary | ICD-10-CM | POA: Insufficient documentation

## 2017-08-15 DIAGNOSIS — E1100 Type 2 diabetes mellitus with hyperosmolarity without nonketotic hyperglycemic-hyperosmolar coma (NKHHC): Secondary | ICD-10-CM | POA: Insufficient documentation

## 2017-08-15 DIAGNOSIS — Z79899 Other long term (current) drug therapy: Secondary | ICD-10-CM | POA: Insufficient documentation

## 2017-08-15 DIAGNOSIS — I428 Other cardiomyopathies: Secondary | ICD-10-CM | POA: Diagnosis not present

## 2017-08-15 DIAGNOSIS — Z91013 Allergy to seafood: Secondary | ICD-10-CM | POA: Insufficient documentation

## 2017-08-15 HISTORY — PX: LEFT HEART CATH AND CORONARY ANGIOGRAPHY: CATH118249

## 2017-08-15 LAB — BASIC METABOLIC PANEL
Anion gap: 11 (ref 5–15)
BUN: 21 mg/dL — AB (ref 6–20)
CO2: 24 mmol/L (ref 22–32)
CREATININE: 0.66 mg/dL (ref 0.44–1.00)
Calcium: 9.5 mg/dL (ref 8.9–10.3)
Chloride: 103 mmol/L (ref 101–111)
GFR calc Af Amer: >60 mL/min (ref >60)
Glucose, Bld: 151 mg/dL — ABNORMAL HIGH (ref 65–99)
Potassium: 4.4 mmol/L (ref 3.5–5.1)
SODIUM: 138 mmol/L (ref 135–145)

## 2017-08-15 LAB — GLUCOSE, CAPILLARY: GLUCOSE-CAPILLARY: 146 mg/dL — AB (ref 65–99)

## 2017-08-15 SURGERY — LEFT HEART CATH AND CORONARY ANGIOGRAPHY
Anesthesia: LOCAL

## 2017-08-15 MED ORDER — FENTANYL CITRATE (PF) 100 MCG/2ML IJ SOLN
INTRAMUSCULAR | Status: DC | PRN
  Administered 2017-08-15: 50 ug via INTRAVENOUS

## 2017-08-15 MED ORDER — VERAPAMIL HCL 2.5 MG/ML IV SOLN
INTRAVENOUS | Status: AC
  Filled 2017-08-15: qty 2

## 2017-08-15 MED ORDER — HEPARIN (PORCINE) IN NACL 2-0.9 UNIT/ML-% IJ SOLN
INTRAMUSCULAR | Status: AC | PRN
  Administered 2017-08-15 (×2): 500 mL

## 2017-08-15 MED ORDER — SODIUM CHLORIDE 0.9 % IV SOLN: INTRAVENOUS | Status: DC

## 2017-08-15 MED ORDER — IOPAMIDOL (ISOVUE-370) INJECTION 76%
INTRAVENOUS | Status: AC
  Filled 2017-08-15: qty 100

## 2017-08-15 MED ORDER — SODIUM CHLORIDE 0.9 % IV SOLN
250.0000 mL | INTRAVENOUS | Status: DC | PRN
Start: 2017-08-15 — End: 2017-08-15

## 2017-08-15 MED ORDER — MIDAZOLAM HCL 2 MG/2ML IJ SOLN
INTRAMUSCULAR | Status: DC | PRN
  Administered 2017-08-15: 2 mg via INTRAVENOUS

## 2017-08-15 MED ORDER — VERAPAMIL HCL 2.5 MG/ML IV SOLN
INTRAVENOUS | Status: DC | PRN
  Administered 2017-08-15 (×2): 10 mL via INTRA_ARTERIAL

## 2017-08-15 MED ORDER — HEPARIN SODIUM (PORCINE) 1000 UNIT/ML IJ SOLN
INTRAMUSCULAR | Status: AC
  Filled 2017-08-15: qty 1

## 2017-08-15 MED ORDER — SODIUM CHLORIDE 0.9% FLUSH: 3.0000 mL | INTRAVENOUS | Status: DC | PRN

## 2017-08-15 MED ORDER — MIDAZOLAM HCL 2 MG/2ML IJ SOLN
INTRAMUSCULAR | Status: AC
  Filled 2017-08-15: qty 2

## 2017-08-15 MED ORDER — SODIUM CHLORIDE 0.9 % IV SOLN: 250.0000 mL | INTRAVENOUS | Status: DC | PRN

## 2017-08-15 MED ORDER — SODIUM CHLORIDE 0.9% FLUSH: 3.0000 mL | Freq: Two times a day (BID) | INTRAVENOUS | Status: DC

## 2017-08-15 MED ORDER — LIDOCAINE HCL (PF) 1 % IJ SOLN
INTRAMUSCULAR | Status: DC | PRN
  Administered 2017-08-15: 2 mL via INTRADERMAL

## 2017-08-15 MED ORDER — SODIUM CHLORIDE 0.9 % WEIGHT BASED INFUSION: 1.0000 mL/kg/h | INTRAVENOUS | Status: DC

## 2017-08-15 MED ORDER — FENTANYL CITRATE (PF) 100 MCG/2ML IJ SOLN
INTRAMUSCULAR | Status: AC
  Filled 2017-08-15: qty 2

## 2017-08-15 MED ORDER — SODIUM CHLORIDE 0.9 % WEIGHT BASED INFUSION
3.0000 mL/kg/h | INTRAVENOUS | Status: AC
  Administered 2017-08-15: 3 mL/kg/h via INTRAVENOUS

## 2017-08-15 MED ORDER — IOPAMIDOL (ISOVUE-370) INJECTION 76%
INTRAVENOUS | Status: DC | PRN
  Administered 2017-08-15: 50 mL via INTRA_ARTERIAL

## 2017-08-15 MED ORDER — LIDOCAINE HCL (PF) 1 % IJ SOLN
INTRAMUSCULAR | Status: AC
  Filled 2017-08-15: qty 30

## 2017-08-15 MED ORDER — HEPARIN SODIUM (PORCINE) 1000 UNIT/ML IJ SOLN
INTRAMUSCULAR | Status: DC | PRN
  Administered 2017-08-15: 5000 [IU] via INTRAVENOUS

## 2017-08-15 MED ORDER — HEPARIN (PORCINE) IN NACL 2-0.9 UNIT/ML-% IJ SOLN
INTRAMUSCULAR | Status: AC
  Filled 2017-08-15: qty 1000

## 2017-08-15 MED ORDER — ASPIRIN 81 MG PO CHEW: 81.0000 mg | CHEWABLE_TABLET | ORAL | Status: DC

## 2017-08-15 SURGICAL SUPPLY — 12 items
CATH 5FR JL3.5 JR4 ANG PIG MP (CATHETERS) ×2 IMPLANT
COVER PRB 48X5XTLSCP FOLD TPE (BAG) ×1 IMPLANT
COVER PROBE 5X48 (BAG) ×1
DEVICE RAD COMP TR BAND LRG (VASCULAR PRODUCTS) ×2 IMPLANT
GLIDESHEATH SLEND SS 6F .021 (SHEATH) ×2 IMPLANT
GUIDEWIRE INQWIRE 1.5J.035X260 (WIRE) ×1 IMPLANT
INQWIRE 1.5J .035X260CM (WIRE) ×2
KIT HEART LEFT (KITS) ×2 IMPLANT
PACK CARDIAC CATHETERIZATION (CUSTOM PROCEDURE TRAY) ×2 IMPLANT
TRANSDUCER W/STOPCOCK (MISCELLANEOUS) ×2 IMPLANT
TUBING CIL FLEX 10 FLL-RA (TUBING) ×2 IMPLANT
WIRE HI TORQ VERSACORE-J 145CM (WIRE) ×2 IMPLANT

## 2017-08-15 NOTE — Research (Signed)
CADFEM Informed Consent   Subject Name: Julie Graves  Subject met inclusion and exclusion criteria.  The informed consent form, study requirements and expectations were reviewed with the subject and questions and concerns were addressed prior to the signing of the consent form.  The subject verbalized understanding of the trail requirements.  The subject agreed to participate in the CADFEM trial and signed the informed consent.  The informed consent was obtained prior to performance of any protocol-specific procedures for the subject.  A copy of the signed informed consent was given to the subject and a copy was placed in the subject's medical record.  [name] 08/15/2017, 09:01 AM

## 2017-08-15 NOTE — Interval H&P Note (Signed)
Cath Lab Visit (complete for each Cath Lab visit)  Clinical Evaluation Leading to the Procedure:   ACS: No.  Non-ACS:    Anginal Classification: CCS III  Anti-ischemic medical therapy: Minimal Therapy (1 class of medications)  Non-Invasive Test Results: No non-invasive testing performed  Prior CABG: No previous CABG      History and Physical Interval Note:  08/15/2017 11:04 AM  Julie Graves  has presented today for surgery, with the diagnosis of angina  The various methods of treatment have been discussed with the patient and family. After consideration of risks, benefits and other options for treatment, the patient has consented to  Procedure(s): LEFT HEART CATH AND CORONARY ANGIOGRAPHY (N/A) as a surgical intervention .  The patient's history has been reviewed, patient examined, no change in status, stable for surgery.  I have reviewed the patient's chart and labs.  Questions were answered to the patient's satisfaction.     Larae Grooms

## 2017-08-15 NOTE — Discharge Instructions (Signed)

## 2017-08-16 ENCOUNTER — Telehealth: Payer: Self-pay | Admitting: Cardiology

## 2017-08-16 ENCOUNTER — Encounter (HOSPITAL_COMMUNITY): Payer: Self-pay | Admitting: Interventional Cardiology

## 2017-08-16 MED FILL — Heparin Sodium (Porcine) 2 Unit/ML in Sodium Chloride 0.9%: INTRAMUSCULAR | Qty: 1000 | Status: AC

## 2017-08-16 NOTE — Telephone Encounter (Signed)
Called patient with no answer nor voicemail.

## 2017-08-16 NOTE — Telephone Encounter (Signed)
Patient has questions about her procedure

## 2017-08-16 NOTE — Telephone Encounter (Signed)
Patient was concerned regarding the CAD she had and how she was informed that she would need to continue medical therapy. Patient requested to have her appointment moved up sooner; this was done.

## 2017-08-21 ENCOUNTER — Ambulatory Visit (INDEPENDENT_AMBULATORY_CARE_PROVIDER_SITE_OTHER): Payer: Medicare Other | Admitting: Cardiology

## 2017-08-21 ENCOUNTER — Encounter: Payer: Self-pay | Admitting: Cardiology

## 2017-08-21 VITALS — BP 128/82 | HR 64 | Ht 61.0 in | Wt 224.4 lb

## 2017-08-21 DIAGNOSIS — E782 Mixed hyperlipidemia: Secondary | ICD-10-CM

## 2017-08-21 DIAGNOSIS — I209 Angina pectoris, unspecified: Secondary | ICD-10-CM

## 2017-08-21 DIAGNOSIS — I10 Essential (primary) hypertension: Secondary | ICD-10-CM | POA: Diagnosis not present

## 2017-08-21 DIAGNOSIS — I2510 Atherosclerotic heart disease of native coronary artery without angina pectoris: Secondary | ICD-10-CM

## 2017-08-21 MED ORDER — RANOLAZINE ER 1000 MG PO TB12: 1000.0000 mg | ORAL_TABLET | Freq: Two times a day (BID) | ORAL | 1 refills | Status: DC

## 2017-08-21 NOTE — Progress Notes (Signed)
Cardiology Office Note:    Date:  08/21/2017   ID:  Julie Graves, DOB 04-Mar-1941, MRN [id number]  PCP:  Leamon Arnt, MD  Cardiologist:  Jenean Lindau, MD   Referring MD: Leamon Arnt, MD    ASSESSMENT:    1. Angina pectoris ([place])   2. Coronary artery disease involving native coronary artery of native heart without angina pectoris   3. Essential hypertension   4. Mixed dyslipidemia   5. Morbid obesity ([place])    PLAN:    In order of problems listed above:  1. Secondary prevention stressed with the patient.  Importance of compliance with diet and medications stressed and she vocalized understanding.  Sublingual nitroglycerin prescription was sent, its protocol and 911 protocol explained and the patient vocalized understanding questions were answered to the patient's satisfaction I also discussed with the patient that she needs to reduce weight and the risks of obesity were discussed. 2. In view of her coronary anatomy I have prescribed ranolazine 500 mg twice daily for 2 weeks then 1 g twice daily.  She will be seen in follow-up appointment in 3 months or earlier if she has any concerns.  Again risks of obesity was explained and she plans to work aggressively on this to lose weight.   Medication Adjustments/Labs and Tests Ordered: Current medicines are reviewed at length with the patient today.  Concerns regarding medicines are outlined above.  No orders of the defined types were placed in this encounter.  No orders of the defined types were placed in this encounter.    Chief Complaint  Patient presents with  . Follow-up     History of Present Illness:    Julie Graves is a 77 y.o. female.  The patient underwent coronary angiography for symptoms of angina.  She is a morbidly obese lady who leads a sedentary lifestyle.  No orthopnea or PND.  She is here for post-cath follow-up.  She has multiple nonobstructive stenosis and obstructive stenosis in a small  caliber artery for which no intervention was done.  She denies any chest pain orthopnea or PND now.  She has some chest tightness when she exerts herself more than usual.  Past Medical History:  Diagnosis Date  . Anginal pain ([place])    "small pain?nerves in chest"started 10/05/13   . Anginal pain ([place])    cleared by cardiology 3/15  . Anxiety    rx given recent not taken yet  . Aortic insufficiency    mild on 9/11 cath  . CAD (coronary artery disease)    nonobstructive let heart cath 3/11: 40% ostial D1, 30% mid CFX  . CHF (congestive heart failure) ([place])   . Colon polyps 02/01/2009    MULTIPLE FRAGMENTS OF TUBULAR ADENOMAS  . Diabetes mellitus    no meds  . Frozen shoulder   . GERD (gastroesophageal reflux disease)    occ  . History of blood transfusion    pregnancy  . HLD (hyperlipidemia)   . HTN (hypertension)    ACEI cough  . Hx of cardiovascular stress test    Lexiscan Myoview (10/2013):  Fixed ant defect most c/w breast attenuation, cannot exclude apical infarct; no ischemia, EF 46%; Low Risk  . Nonischemic cardiomyopathy ([place])    Ech 3/11 difficult study, moderate aortic insuficiency noted. Left evntriculogram 3/11  . Obese   . Osteoarthritis   . Palpitation    PACs noted on telemetry while in the hospital  . Pneumonia  hx  . Stress incontinence     Past Surgical History:  Procedure Laterality Date  . ABDOMINAL HYSTERECTOMY    . APPENDECTOMY    . BLADDER NECK SUSPENSION  85  . EYE SURGERY    . HAND SURGERY     right "had knots cut out"  . KNEE ARTHROPLASTY Right 12/09/2013   Procedure: COMPUTER ASSISTED Right TOTAL KNEE ARTHROPLASTY;  Surgeon: Marybelle Killings, MD;  Location: [facility];  Service: Orthopedics;  Laterality: Right;  . LEFT HEART CATH AND CORONARY ANGIOGRAPHY N/A 08/15/2017   Procedure: LEFT HEART CATH AND CORONARY ANGIOGRAPHY;  Surgeon: Jettie Booze, MD;  Location: [facility];  Service: Cardiovascular;  Laterality: N/A;  . TUBAL LIGATION       Current Medications: Current Meds  Medication Sig  . acetaminophen (TYLENOL) 500 MG tablet Take 1,000 mg by mouth every 6 (six) hours as needed for moderate pain or headache.   . alendronate (FOSAMAX) 70 MG tablet Take 70 mg by mouth every Monday. Take with a full glass of water on an empty stomach. Take on mondays  . aspirin 81 MG tablet Take 81 mg by mouth 2 (two) times daily.   . Calcium Carb-Cholecalciferol (CALCIUM 600/VITAMIN D3 PO) Take 1 tablet by mouth 2 (two) times daily.  . carvedilol (COREG) 6.25 MG tablet Take 6.25 mg by mouth 2 (two) times daily.    . cetirizine (ZYRTEC) 10 MG tablet Take 10 mg by mouth daily.   . Cholecalciferol (VITAMIN D) 2000 units CAPS Take 2,000 Units by mouth daily.   . diphenhydrAMINE (BENADRYL) 50 MG capsule Take 50 mg right before you get to the hospital for heart cath  . fluticasone (FLONASE) 50 MCG/ACT nasal spray Place 1 spray into both nostrils daily as needed for allergies or rhinitis.  . furosemide (LASIX) 20 MG tablet Take 20 mg by mouth daily as needed for fluid.   Marland Kitchen HYDROcodone-acetaminophen (NORCO/VICODIN) 5-325 MG tablet Take 1 tablet by mouth every 6 (six) hours as needed for moderate pain.  Marland Kitchen losartan (COZAAR) 25 MG tablet Take 25 mg by mouth every morning.   . metFORMIN (GLUCOPHAGE-XR) 500 MG 24 hr tablet Take 500 mg by mouth daily with breakfast  . nitroGLYCERIN (NITROSTAT) 0.4 MG SL tablet Place 1 tablet (0.4 mg total) under the tongue every 5 (five) minutes as needed. (Patient taking differently: Place 0.4 mg under the tongue every 5 (five) minutes as needed for chest pain. )  . potassium chloride SA (KLOR-CON M20) 20 MEQ tablet Take 20 mEq by mouth daily as needed (with Lasix).   . predniSONE (DELTASONE) 50 MG tablet Take 1 tablet (50 mg total) by mouth every 6 (six) hours. Take last dose prior to arrival to hospital  . simvastatin (ZOCOR) 40 MG tablet Take 40 mg by mouth at bedtime.       Allergies:   Shellfish allergy; Iodine;  Lisinopril; Tramadol; and Penicillins   Social History   Socioeconomic History  . Marital status: Married    Spouse name: None  . Number of children: None  . Years of education: None  . Highest education level: None  Social Needs  . Financial resource strain: None  . Food insecurity - worry: None  . Food insecurity - inability: None  . Transportation needs - medical: None  . Transportation needs - non-medical: None  Occupational History  . None  Tobacco Use  . Smoking status: Former Smoker    Packs/day: 1.00  Years: 15.00    Pack years: 15.00    Types: Cigarettes    Last attempt to quit: 10/10/1983    Years since quitting: 33.8  . Smokeless tobacco: Never Used  . Tobacco comment: Quit 1970s  Substance and Sexual Activity  . Alcohol use: No    Alcohol/week: 0.0 oz  . Drug use: No  . Sexual activity: None  Other Topics Concern  . None  Social History Narrative  . None     Family History: The patient's family history includes Colon cancer in her brother; Diabetes in her brother and sister; Heart attack in her father and sister; Heart disease in her sister; Stroke (age of onset: 92) in her mother.  ROS:   Please see the history of present illness.    All other systems reviewed and are negative.  EKGs/Labs/Other Studies Reviewed:    The following studies were reviewed today: Coronary angiography report was discussed with the patient at extensive length.   Recent Labs: 08/13/2017: Hemoglobin 13.1; Platelets 281 08/15/2017: BUN 21; Creatinine, Ser 0.66; Potassium 4.4; Sodium 138  Recent Lipid Panel    Component Value Date/Time   CHOL 140 07/09/2014 1637   TRIG 243 (H) 07/09/2014 1637   HDL 55 07/09/2014 1637   CHOLHDL 2.5 07/09/2014 1637   VLDL 49 (H) 07/09/2014 1637   LDLCALC 36 07/09/2014 1637    Physical Exam:    VS:  BP 128/82 (BP Location: Right Arm, Patient Position: Sitting, Cuff Size: Normal)   Pulse 64   Ht 5\' 1"  (1.549 m)   Wt 224 lb 6.4 oz  (101.8 kg)   SpO2 98%   BMI 42.40 kg/m     Wt Readings from Last 3 Encounters:  08/21/17 224 lb 6.4 oz (101.8 kg)  08/15/17 222 lb (100.7 kg)  08/13/17 227 lb (103 kg)     GEN: Patient is in no acute distress HEENT: Normal NECK: No JVD; No carotid bruits LYMPHATICS: No lymphadenopathy CARDIAC: Hear sounds regular, 2/6 systolic murmur at the apex. RESPIRATORY:  Clear to auscultation without rales, wheezing or rhonchi  ABDOMEN: Soft, non-tender, non-distended MUSCULOSKELETAL:  No edema; No deformity  SKIN: Warm and dry NEUROLOGIC:  Alert and oriented x 3 PSYCHIATRIC:  Normal affect   Signed, Jenean Lindau, MD  08/21/2017 11:25 AM    [place]

## 2017-08-21 NOTE — Addendum Note (Signed)
Addended by: Mattie Marlin on: 08/21/2017 11:36 AM   Modules accepted: Orders

## 2017-08-21 NOTE — Patient Instructions (Signed)
Medication Instructions:  Your physician has recommended you make the following change in your medication:  START ranexa 500 mg twice daily for two weeks and then change to 1,000 mg twice daily  Labwork: None  Testing/Procedures: None  Follow-Up: Your physician recommends that you schedule a follow-up appointment in: 6 months  Any Other Special Instructions Will Be Listed Below (If Applicable).     If you need a refill on your cardiac medications before your next appointment, please call your pharmacy.   [place], RN, BSN  Ranolazine tablets, extended release What is this medicine? RANOLAZINE (ra NOE la zeen) is a heart medicine. It is used to treat chronic chest pain (angina). This medicine must be taken regularly. It will not relieve an acute episode of chest pain. This medicine may be used for other purposes; ask your health care provider or pharmacist if you have questions. COMMON BRAND NAME(S): Ranexa What should I tell my health care provider before I take this medicine? They need to know if you have any of these conditions: -heart disease -irregular heartbeat -kidney disease -liver disease -low levels of potassium or magnesium in the blood -an unusual or allergic reaction to ranolazine, other medicines, foods, dyes, or preservatives -pregnant or trying to get pregnant -breast-feeding How should I use this medicine? Take this medicine by mouth with a glass of water. Follow the directions on the prescription label. Do not cut, crush, or chew this medicine. Take with or without food. Do not take this medication with grapefruit juice. Take your doses at regular intervals. Do not take your medicine more often then directed. Talk to your pediatrician regarding the use of this medicine in children. Special care may be needed. Overdosage: If you think you have taken too much of this medicine contact a poison control center or emergency room at once. NOTE:  This medicine is only for you. Do not share this medicine with others. What if I miss a dose? If you miss a dose, take it as soon as you can. If it is almost time for your next dose, take only that dose. Do not take double or extra doses. What may interact with this medicine? Do not take this medicine with any of the following medications: -antivirals for HIV or AIDS -cerivastatin -certain antibiotics like chloramphenicol, clarithromycin, dalfopristin; quinupristin, isoniazid, rifabutin, rifampin, rifapentine -certain medicines used for cancer like imatinib, nilotinib -certain medicines for fungal infections like fluconazole, itraconazole, ketoconazole, posaconazole, voriconazole -certain medicines for irregular heart beat like dofetilide, dronedarone -certain medicines for seizures like carbamazepine, fosphenytoin, oxcarbazepine, phenobarbital, phenytoin -cisapride -conivaptan -cyclosporine -grapefruit or grapefruit juice -lumacaftor; ivacaftor -nefazodone -pimozide -quinacrine -St John's wort -thioridazine -ziprasidone This medicine may also interact with the following medications: -alfuzosin -certain medicines for depression, anxiety, or psychotic disturbances like bupropion, citalopram, fluoxetine, fluphenazine, paroxetine, perphenazine, risperidone, sertraline, trifluoperazine -certain medicines for cholesterol like atorvastatin, lovastatin, simvastatin -certain medicines for stomach problems like octreotide, palonosetron, prochlorperazine -eplerenone -ergot alkaloids like dihydroergotamine, ergonovine, ergotamine, methylergonovine -metformin -nicardipine -other medicines that prolong the QT interval (cause an abnormal heart rhythm) -sirolimus -tacrolimus This list may not describe all possible interactions. Give your health care provider a list of all the medicines, herbs, non-prescription drugs, or dietary supplements you use. Also tell them if you smoke, drink alcohol, or  use illegal drugs. Some items may interact with your medicine. What should I watch for while using this medicine? Visit your doctor for regular check ups. Tell your doctor or healthcare professional  if your symptoms do not start to get better or if they get worse. This medicine will not relieve an acute attack of angina or chest pain. This medicine can change your heart rhythm. Your health care provider may check your heart rhythm by ordering an electrocardiogram (ECG) while you are taking this medicine. You may get drowsy or dizzy. Do not drive, use machinery, or do anything that needs mental alertness until you know how this medicine affects you. Do not stand or sit up quickly, especially if you are an older patient. This reduces the risk of dizzy or fainting spells. Alcohol may interfere with the effect of this medicine. Avoid alcoholic drinks. If you are scheduled for any medical or dental procedure, tell your healthcare provider that you are taking this medicine. This medicine can interact with other medicines used during surgery. What side effects may I notice from receiving this medicine? Side effects that you should report to your doctor or health care professional as soon as possible: -allergic reactions like skin rash, itching or hives, swelling of the face, lips, or tongue -breathing problems -changes in vision -fast, irregular or pounding heartbeat -feeling faint or lightheaded, falls -low or high blood pressure -numbness or tingling feelings -ringing in the ears -tremor or shakiness -slow heartbeat (fewer than 50 beats per minute) -swelling of the legs or feet Side effects that usually do not require medical attention (report to your doctor or health care professional if they continue or are bothersome): -constipation -drowsy -dry mouth -headache -nausea or vomiting -stomach upset This list may not describe all possible side effects. Call your doctor for medical advice about  side effects. You may report side effects to FDA at 1-800-FDA-1088. Where should I keep my medicine? Keep out of the reach of children. Store at room temperature between 15 and 30 degrees C (59 and 86 degrees F). Throw away any unused medicine after the expiration date. NOTE: This sheet is a summary. It may not cover all possible information. If you have questions about this medicine, talk to your doctor, pharmacist, or health care provider.  2018 Elsevier/Gold Standard (2015-07-14 12:24:15)

## 2017-08-29 ENCOUNTER — Other Ambulatory Visit: Payer: Self-pay | Admitting: Nurse Practitioner

## 2017-08-29 DIAGNOSIS — M8580 Other specified disorders of bone density and structure, unspecified site: Secondary | ICD-10-CM

## 2017-09-10 ENCOUNTER — Ambulatory Visit: Payer: Medicare Other | Admitting: Cardiology

## 2017-09-13 ENCOUNTER — Telehealth: Payer: Self-pay

## 2017-09-13 ENCOUNTER — Other Ambulatory Visit: Payer: Self-pay

## 2017-09-13 MED ORDER — ISOSORBIDE MONONITRATE ER 30 MG PO TB24: 30.0000 mg | ORAL_TABLET | Freq: Every day | ORAL | 3 refills | Status: DC

## 2017-09-13 NOTE — Telephone Encounter (Signed)
Patient called stating that she cannot tolerate her new medication ranexa. Per the patient it has caused her to gain 4-5 pounds and have heaviness in the chest. Per Dr. Bettina Gavia the patient is to discontinue ranexa and start imdur. The patient was satisfied with this option.

## 2017-10-18 ENCOUNTER — Other Ambulatory Visit: Payer: Self-pay | Admitting: Nurse Practitioner

## 2017-10-18 DIAGNOSIS — Z1231 Encounter for screening mammogram for malignant neoplasm of breast: Secondary | ICD-10-CM

## 2017-11-27 ENCOUNTER — Ambulatory Visit
Admission: RE | Admit: 2017-11-27 | Discharge: 2017-11-27 | Disposition: A | Discharge status: Home / Self Care | Payer: Medicare Other | Source: Ambulatory Visit | Attending: Nurse Practitioner | Admitting: Nurse Practitioner

## 2017-11-27 DIAGNOSIS — M8580 Other specified disorders of bone density and structure, unspecified site: Secondary | ICD-10-CM

## 2017-11-27 DIAGNOSIS — Z1231 Encounter for screening mammogram for malignant neoplasm of breast: Secondary | ICD-10-CM

## 2018-02-21 ENCOUNTER — Ambulatory Visit (INDEPENDENT_AMBULATORY_CARE_PROVIDER_SITE_OTHER): Payer: Medicare Other

## 2018-02-21 ENCOUNTER — Encounter (INDEPENDENT_AMBULATORY_CARE_PROVIDER_SITE_OTHER): Payer: Self-pay | Admitting: Orthopedic Surgery

## 2018-02-21 ENCOUNTER — Ambulatory Visit (INDEPENDENT_AMBULATORY_CARE_PROVIDER_SITE_OTHER): Payer: Medicare Other | Admitting: Orthopedic Surgery

## 2018-02-21 DIAGNOSIS — M19011 Primary osteoarthritis, right shoulder: Secondary | ICD-10-CM

## 2018-02-21 DIAGNOSIS — M25511 Pain in right shoulder: Secondary | ICD-10-CM | POA: Diagnosis not present

## 2018-02-21 MED ORDER — LIDOCAINE HCL 1 % IJ SOLN
3.0000 mL | INTRAMUSCULAR | Status: AC | PRN
Start: 2018-02-21 — End: 2018-02-21
  Administered 2018-02-21: 3 mL

## 2018-02-21 MED ORDER — BUPIVACAINE HCL 0.25 % IJ SOLN
0.6600 mL | INTRAMUSCULAR | Status: AC | PRN
Start: 2018-02-21 — End: 2018-02-21
  Administered 2018-02-21: .66 mL via INTRA_ARTICULAR

## 2018-02-21 MED ORDER — METHYLPREDNISOLONE ACETATE 40 MG/ML IJ SUSP
13.3300 mg | INTRAMUSCULAR | Status: AC | PRN
Start: 2018-02-21 — End: 2018-02-21
  Administered 2018-02-21: 13.33 mg via INTRA_ARTICULAR

## 2018-02-21 NOTE — Progress Notes (Signed)
Office Visit Note   Patient: Julie Graves           Date of Birth: September 15, 1940           MRN: [id number] Visit Date: 02/21/2018 Requested by: Bernerd Limbo, MD [facility] [address] [place], [place] [address] PCP: Neita Carp, FNP  Subjective: Chief Complaint  Patient presents with  . Right Shoulder - Pain    HPI: Patient presents for right shoulder pain.  She describes 1 month history of symptoms.  No history of injury.  She states the pain is on the top of her shoulder.  She is been taking Tylenol for her symptoms.  She is tried alcohol pads as well as heating pad.  Overhead motion okay but below shoulder level crossarm adduction and reaching behind her back is painful.              ROS: All systems reviewed are negative as they relate to the chief complaint within the history of present illness.  Patient denies  fevers or chills.   Assessment & Plan: Visit Diagnoses:  1. Acute pain of right shoulder     Plan: Impression is right shoulder pain with probable AC joint arthritis and good rotator cuff strength.  Radiographs unremarkable except for some AC joint arthritis.  Plan is ultrasound-guided injection into the [facility] joint today.  If her symptoms do not improve within a month we could consider further imaging but it looks like structurally her shoulder is okay.  I will see her back as needed.  Follow-Up Instructions: No follow-ups on file.   Orders:  Orders Placed This Encounter  Procedures  . XR Shoulder Right   No orders of the defined types were placed in this encounter.     Procedures: Medium Joint Inj: R acromioclavicular on 02/21/2018 10:54 AM Indications: diagnostic evaluation and pain Details: 25 G 1.5 in needle, ultrasound-guided superior approach Medications: 3 mL lidocaine 1 %; 0.66 mL bupivacaine 0.25 %; 13.33 mg methylPREDNISolone acetate 40 MG/ML Outcome: tolerated well, no immediate complications Procedure, treatment alternatives, risks and  benefits explained, specific risks discussed. Consent was given by the patient. Immediately prior to procedure a time out was called to verify the correct patient, procedure, equipment, support staff and site/side marked as required. Patient was prepped and draped in the usual sterile fashion.       Clinical Data: No additional findings.  Objective: Vital Signs: There were no vitals taken for this visit.  Physical Exam:   Constitutional: Patient appears well-developed HEENT:  Head: Normocephalic Eyes:EOM are normal Neck: Normal range of motion Cardiovascular: Normal rate Pulmonary/chest: Effort normal Neurologic: Patient is alert Skin: Skin is warm Psychiatric: Patient has normal mood and affect    Ortho Exam: Ortho exam demonstrates good cervical spine range of motion.  5 out of 5 grip EPL FPL interosseous wrist flexion wrist extension bicep triceps and deltoid strength.  Patient does have AC joint tenderness on the right compared to the left.  Rotator cuff strength intact isolated infraspinatus supinates and subscap muscle testing.  She has a fair amount of guarding with attempts at passive range of motion but I do not detect any coarseness or grinding with motion exercises.  Specialty Comments:  No specialty comments available.  Imaging: Xr Shoulder Right  Result Date: 02/21/2018 AP outlet and axillary right shoulder reviewed.  Enthesopathic changes noted at the rotator cuff insertion.  Mild AC joint arthritis is present.  Visualized lung fields clear.  No glenohumeral arthritis present.    PMFS History: Patient Active Problem List   Diagnosis Date Noted  . Precordial pain   . Angina pectoris ([place]) 08/13/2017  . Mixed dyslipidemia 08/13/2017  . CAD (coronary artery disease), native coronary artery 08/13/2017  . Mild episode of recurrent major depressive disorder ([place]) 11/03/2013  . Urge incontinence 04/02/2013  . Spinal stenosis, lumbar region, without neurogenic  claudication 10/27/2012  . Spinal stenosis of lumbar region 10/01/2012  . Allergic rhinitis 09/26/2011  . Dizziness 01/23/2011  . Dyspnea 01/23/2011  . Type II diabetes mellitus ([place]) 06/27/2010  . Morbid obesity ([place]) 06/27/2010  . Impaired glucose tolerance 06/27/2010  . Disorder of bone and cartilage 06/22/2010  . Primary cardiomyopathy ([place]) 03/23/2010  . HYPERLIPIDEMIA-MIXED 03/07/2010  . Essential hypertension 03/07/2010  . CARDIOMEGALY 11/30/2009  . OTHER DYSPNEA AND RESPIRATORY ABNORMALITIES 11/30/2009  . AORTIC REGURGITATION 09/28/2009  . Chronic diastolic heart failure ([place]) 09/28/2009  . Aortic regurgitation 09/28/2009  . 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 Osteoarthrosis involving multiple sites 10/26/2008   Past Medical History:  Diagnosis Date  . Anginal pain ([place])    "small pain?nerves in chest"started 10/05/13   . Anginal pain ([place])    cleared by cardiology 3/15  . Anxiety    rx given recent not taken yet  . Aortic insufficiency    mild on 9/11 cath  . CAD (coronary artery disease)    nonobstructive let heart cath 3/11: 40% ostial D1, 30% mid CFX  . CHF (congestive heart failure) ([place])   . Colon polyps 02/01/2009    MULTIPLE FRAGMENTS OF TUBULAR ADENOMAS  . Diabetes mellitus    no meds  . Frozen shoulder   . GERD (gastroesophageal reflux disease)    occ  . History of blood transfusion    pregnancy  . HLD (hyperlipidemia)   . HTN (hypertension)    ACEI cough  . Hx of cardiovascular stress test    Lexiscan Myoview (10/2013):  Fixed ant defect most c/w breast attenuation, cannot exclude apical infarct; no ischemia, EF 46%; Low Risk  . Nonischemic cardiomyopathy ([place])    Ech 3/11 difficult study, moderate aortic insuficiency noted. Left evntriculogram 3/11  . Obese   . Osteoarthritis   . Palpitation    PACs noted on telemetry while in the hospital  . Pneumonia    hx  . Stress incontinence     Family History  Problem Relation Age of Onset  .  Heart attack Father   . Heart attack Sister   . Stroke Mother 30  . Diabetes Sister   . Heart disease Sister   . Diabetes Brother   . Colon cancer Brother     Past Surgical History:  Procedure Laterality Date  . ABDOMINAL HYSTERECTOMY    . APPENDECTOMY    . BLADDER NECK SUSPENSION  85  . EYE SURGERY    . HAND SURGERY     right "had knots cut out"  . KNEE ARTHROPLASTY Right 12/09/2013   Procedure: COMPUTER ASSISTED Right TOTAL KNEE ARTHROPLASTY;  Surgeon: Marybelle Killings, MD;  Location: [facility];  Service: Orthopedics;  Laterality: Right;  . LEFT HEART CATH AND CORONARY ANGIOGRAPHY N/A 08/15/2017   Procedure: LEFT HEART CATH AND CORONARY ANGIOGRAPHY;  Surgeon: Jettie Booze, MD;  Location: [facility];  Service: Cardiovascular;  Laterality: N/A;  . TUBAL LIGATION     Social History   Occupational History  . Not on file  Tobacco Use  . Smoking status:  Former Smoker    Packs/day: 1.00    Years: 15.00    Pack years: 15.00    Types: Cigarettes    Last attempt to quit: 10/10/1983    Years since quitting: 34.3  . Smokeless tobacco: Never Used  . Tobacco comment: Quit 1970s  Substance and Sexual Activity  . Alcohol use: No    Alcohol/week: 0.0 standard drinks  . Drug use: No  . Sexual activity: Not on file

## 2018-08-11 ENCOUNTER — Other Ambulatory Visit: Payer: Self-pay | Admitting: Cardiology

## 2018-08-12 ENCOUNTER — Other Ambulatory Visit: Payer: Self-pay | Admitting: Cardiology

## 2018-09-04 ENCOUNTER — Ambulatory Visit: Payer: Medicare Other | Admitting: Cardiology

## 2018-09-04 ENCOUNTER — Other Ambulatory Visit: Payer: Self-pay

## 2018-09-04 ENCOUNTER — Encounter: Payer: Self-pay | Admitting: Cardiology

## 2018-09-04 VITALS — BP 158/72 | HR 62 | Ht 60.0 in | Wt 219.0 lb

## 2018-09-04 DIAGNOSIS — I2510 Atherosclerotic heart disease of native coronary artery without angina pectoris: Secondary | ICD-10-CM | POA: Diagnosis not present

## 2018-09-04 DIAGNOSIS — I5032 Chronic diastolic (congestive) heart failure: Secondary | ICD-10-CM

## 2018-09-04 DIAGNOSIS — R002 Palpitations: Secondary | ICD-10-CM

## 2018-09-04 DIAGNOSIS — E119 Type 2 diabetes mellitus without complications: Secondary | ICD-10-CM

## 2018-09-04 DIAGNOSIS — Z6841 Body Mass Index (BMI) 40.0 and over, adult: Secondary | ICD-10-CM

## 2018-09-04 DIAGNOSIS — I10 Essential (primary) hypertension: Secondary | ICD-10-CM | POA: Diagnosis not present

## 2018-09-04 MED ORDER — SPIRONOLACTONE 25 MG PO TABS: 25.0000 mg | ORAL_TABLET | Freq: Every day | ORAL | 2 refills | Status: DC

## 2018-09-04 NOTE — Progress Notes (Signed)
Subjective:  Primary Physician:  Harrison Mons, PA  Patient ID: Julie Graves, female    DOB: 1940/09/06, 78 y.o.   MRN: [id number]  Chief Complaint  Patient presents with  . Congestive Heart Failure  . Follow-up  . Shortness of Breath    HPI: Julie Graves  is a 78 y.o. female  with  H/O morbid obesity, , nonischemic cardiomyopathy with EF 40% by echo in June 2019. Coronary angiogram on 08/21/17 and revealed mid circumflex 50% and small with 2 distal diagonal 80% stenosis. She has mild Chronic stable angina pectoris. She also has mild to moderate aortic insufficiency, diabetes (Patient reluctant to starting metformin due to side-effects), hypertension, hyperlipidemia, former tobacco use with 15-pack-year history.  Patient is here on 1 month follow-up visit for acute on chronic systolic and diastolic heart failure. She is now following keto-diet, has lost about 6-7 pounds in weight, leg edema has improved and dyspnea is also improved. she still has occasional chest tightness with exertional activity and chronic dyspnea has persistent but no PND or orthopnea.  Past Medical History:  Diagnosis Date  . Anginal pain ([place])    "small pain?nerves in chest"started 10/05/13   . Anginal pain ([place])    cleared by cardiology 3/15  . Anxiety    rx given recent not taken yet  . Aortic insufficiency    mild on 9/11 cath  . CAD (coronary artery disease)    nonobstructive let heart cath 3/11: 40% ostial D1, 30% mid CFX  . CHF (congestive heart failure) ([place])   . Colon polyps 02/01/2009    MULTIPLE FRAGMENTS OF TUBULAR ADENOMAS  . Diabetes mellitus    no meds  . Frozen shoulder   . GERD (gastroesophageal reflux disease)    occ  . History of blood transfusion    pregnancy  . HLD (hyperlipidemia)   . HTN (hypertension)    ACEI cough  . Hx of cardiovascular stress test    Lexiscan Myoview (10/2013):  Fixed ant defect most c/w breast attenuation, cannot exclude apical infarct; no  ischemia, EF 46%; Low Risk  . Nonischemic cardiomyopathy ([place])    Ech 3/11 difficult study, moderate aortic insuficiency noted. Left evntriculogram 3/11  . Obese   . Osteoarthritis   . Palpitation    PACs noted on telemetry while in the hospital  . Pneumonia    hx  . Stress incontinence     Past Surgical History:  Procedure Laterality Date  . ABDOMINAL HYSTERECTOMY    . APPENDECTOMY    . BLADDER NECK SUSPENSION  85  . EYE SURGERY    . HAND SURGERY     right "had knots cut out"  . KNEE ARTHROPLASTY Right 12/09/2013   Procedure: COMPUTER ASSISTED Right TOTAL KNEE ARTHROPLASTY;  Surgeon: Marybelle Killings, MD;  Location: [facility];  Service: Orthopedics;  Laterality: Right;  . LEFT HEART CATH AND CORONARY ANGIOGRAPHY N/A 08/15/2017   Procedure: LEFT HEART CATH AND CORONARY ANGIOGRAPHY;  Surgeon: Jettie Booze, MD;  Location: [facility];  Service: Cardiovascular;  Laterality: N/A;  . TUBAL LIGATION      Social History   Socioeconomic History  . Marital status: Married    Spouse name: Not on file  . Number of children: 3  . Years of education: Not on file  . Highest education level: Not on file  Occupational History  . Not on file  Social Needs  . Financial resource strain: Not on file  .  Food insecurity:    Worry: Not on file    Inability: Not on file  . Transportation needs:    Medical: Not on file    Non-medical: Not on file  Tobacco Use  . Smoking status: Former Smoker    Packs/day: 1.00    Years: 15.00    Pack years: 15.00    Types: Cigarettes    Last attempt to quit: 10/10/1983    Years since quitting: 34.9  . Smokeless tobacco: Never Used  . Tobacco comment: Quit 1970s  Substance and Sexual Activity  . Alcohol use: No    Alcohol/week: 0.0 standard drinks  . Drug use: No  . Sexual activity: Not on file  Lifestyle  . Physical activity:    Days per week: Not on file    Minutes per session: Not on file  . Stress: Not on file  Relationships  . Social  connections:    Talks on phone: Not on file    Gets together: Not on file    Attends religious service: Not on file    Active member of club or organization: Not on file    Attends meetings of clubs or organizations: Not on file    Relationship status: Not on file  . Intimate partner violence:    Fear of current or ex partner: Not on file    Emotionally abused: Not on file    Physically abused: Not on file    Forced sexual activity: Not on file  Other Topics Concern  . Not on file  Social History Narrative  . Not on file    Current Outpatient Medications on File Prior to Visit  Medication Sig Dispense Refill  . acetaminophen (TYLENOL) 500 MG tablet Take 1,000 mg by mouth every 6 (six) hours as needed for moderate pain or headache.     . alendronate (FOSAMAX) 70 MG tablet Take 70 mg by mouth every Monday. Take with a full glass of water on an empty stomach. Take on mondays    . aspirin 81 MG tablet Take 81 mg by mouth 2 (two) times daily.     . Calcium Carb-Cholecalciferol (CALCIUM 600/VITAMIN D3 PO) Take 1 tablet by mouth 2 (two) times daily.    . carvedilol (COREG) 6.25 MG tablet Take 6.25 mg by mouth 2 (two) times daily.      . cetirizine (ZYRTEC) 10 MG tablet Take 10 mg by mouth as needed.     . Cholecalciferol (VITAMIN D) 2000 units CAPS Take 2,000 Units by mouth daily.     . fluticasone (FLONASE) 50 MCG/ACT nasal spray Place 1 spray into both nostrils daily as needed for allergies or rhinitis.    . furosemide (LASIX) 20 MG tablet Take 20 mg by mouth daily as needed for fluid.     Marland Kitchen HYDROcodone-acetaminophen (NORCO/VICODIN) 5-325 MG tablet Take 1 tablet by mouth every 6 (six) hours as needed for moderate pain.    Marland Kitchen losartan (COZAAR) 50 MG tablet TAKE 1 TABLET BY MOUTH EVERY DAY 90 tablet 3  . pantoprazole (PROTONIX) 20 MG tablet Take 20 mg by mouth as needed.     . potassium chloride SA (KLOR-CON M20) 20 MEQ tablet Take 20 mEq by mouth daily as needed (with Lasix).     Marland Kitchen  simvastatin (ZOCOR) 40 MG tablet Take 40 mg by mouth at bedtime.      . isosorbide mononitrate (IMDUR) 30 MG 24 hr tablet Take 1 tablet (30 mg total) by mouth daily. [place]  tablet 3  . nitroGLYCERIN (NITROSTAT) 0.4 MG SL tablet Place 1 tablet (0.4 mg total) under the tongue every 5 (five) minutes as needed. (Patient taking differently: Place 0.4 mg under the tongue every 5 (five) minutes as needed for chest pain. ) 11 tablet 6   No current facility-administered medications on file prior to visit.     Review of Systems  Constitutional: Positive for malaise/fatigue. Negative for weight loss.  Respiratory: Positive for shortness of breath (On Exertion). Negative for cough and hemoptysis.   Cardiovascular: Positive for leg swelling. Negative for chest pain, palpitations and claudication.  Gastrointestinal: Negative for abdominal pain, blood in stool, constipation, heartburn and vomiting.  Genitourinary: Negative for dysuria.  Musculoskeletal: Negative for joint pain and myalgias.  Neurological: Negative for dizziness, focal weakness and headaches.  Endo/Heme/Allergies: Does not bruise/bleed easily.  Psychiatric/Behavioral: Negative for depression. The patient is not nervous/anxious.   All other systems reviewed and are negative.      Objective:  Blood pressure (!) 158/72, pulse 62, height 5' (1.524 m), weight 219 lb (99.3 kg), SpO2 97 %. Body mass index is 42.77 kg/m.  Physical Exam  Constitutional: She appears well-developed. No distress.  Morbidly obese  HENT:  Head: Atraumatic.  Eyes: Conjunctivae are normal.  Neck: Neck supple. No thyromegaly present.  Short neck and difficult to evaluate JVP  Cardiovascular: Normal rate, regular rhythm and normal heart sounds. Exam reveals no gallop.  No murmur heard. Pulses:      Carotid pulses are 2+ on the right side and 2+ on the left side.      Dorsalis pedis pulses are 2+ on the right side and 2+ on the left side.       Posterior tibial  pulses are 2+ on the right side and 2+ on the left side.  Femoral and popliteal pulse difficult to feel due to patient's body habitus.   Pulmonary/Chest: Effort normal and breath sounds normal.  Abdominal: Soft. Bowel sounds are normal.  Pannus present  Musculoskeletal: Normal range of motion.        General: No edema.  Neurological: She is alert.  Skin: Skin is warm and dry.  Psychiatric: She has a normal mood and affect.    CARDIAC STUDIES:   Echocardiogram 12/20/2017: Left ventricle cavity is normal in size. Moderate concentric hypertrophy of the left ventricle. Mild decrease in global wall motion. Visual EF is 40-45%. Doppler evidence of grade I (impaired) diastolic dysfunction, normal LAP. Calculated EF 42%. Left atrial cavity is moderately dilated. Mild aortic valve leaflet calcification. Mild aortic valve stenosis. Aortic valve mean gradient of 7 mmHg, Vmax of 1.8 m/s. Calculated aortic valve area by continuity equation is 1.6 cm. Mild to moderate aortic regurgitation. Mild (Grade I) mitral regurgitation. Mild tricuspid regurgitation. No evidence of pulmonary hypertension.  Coronary angiogram 07/2017: mid circumflex 50%, D1 ostial 50% and mid 80%, very small. Mild noncritical CAD other vessels. EF 45%.  Stress nuclear study 11/05/2013:  Exercise Capacity:  Lexiscan with no exercise. BP Response:  Normal blood pressure response. Clinical Symptoms:  There is dyspnea. ECG Impression:  No significant ECG changes with Lexiscan. Comparison with Prior Nuclear Study: No images to compare to, however the clinical repeat appears to be similar. Overall Impression:  Low risk stress nuclear study With mild apical breast attenuation. Cannot exclude small apical infarct. LV Wall Motion:  Unable to adequately it says the overall cardiac function as the gated images were not adequately established to capture wall motion.  Recent  Labs:  CBC Latest Ref Rng & Units 08/13/2017  WBC 3.4 - 10.8 x10E3/uL  7.3  Hemoglobin 11.1 - 15.9 g/dL 13.1  Hematocrit 34.0 - 46.6 % 40.6  Platelets 150 - 379 x10E3/uL 281   BMP Latest Ref Rng & Units 08/15/2017  Glucose 65 - 99 mg/dL 151(H)  BUN 6 - 20 mg/dL 21(H)  Creatinine 0.44 - 1.00 mg/dL 0.66  Sodium 135 - 145 mmol/L 138  Potassium 3.5 - 5.1 mmol/L 4.4  Chloride 101 - 111 mmol/L 103  CO2 22 - 32 mmol/L 24  Calcium 8.9 - 10.3 mg/dL 9.5  GFR calc non Af Amer >60 mL/min >60   11/14/2017: Cholesterol 136, triglycerides 98, HDL 65, LDL 51. BNP 76.9  Assessment & Recommendations:   Chronic diastolic heart failure (HCC) - Plan: EKG 12-Lead, spironolactone (ALDACTONE) 25 MG tablet, Basic Metabolic Panel (BMET)  Palpitations  Class 3 severe obesity due to excess calories with serious comorbidity and body mass index (BMI) of 40.0 to 44.9 in adult [facility])  Coronary artery disease involving native coronary artery of native heart without angina pectoris  Essential hypertension - Plan: spironolactone (ALDACTONE) 25 MG tablet, Basic Metabolic Panel (BMET)  Diet-controlled diabetes mellitus ([place])  EKG 09/04/2018: Normal sinus rhythm at rate of 61 bpm, IVCD, atypical LBBB, LVH.  Normal QT interval.  No evidence of ischemia.  Recommendation:   Julie Graves is an AAF with H/O morbid obesity, , nonischemic cardiomyopathy with EF 40% by echo in June 2019. Coronary angiogram on 08/21/17 and revealed mid circumflex 50% and small with 2 distal diagonal 80% stenosis. hypertension, hyperlipidemia, former tobacco use with 15-pack-year history.    Patient is here on a six-month office visit and follow-up of chronic diastolic heart failure, she has not had any acute decompensation although unfortunately has not had any luck in losing weight.  She is reluctant to starting metformin for diabetes.  Lipids are being managed by her PCP, blood pressure is uncontrolled today, both for heart failure and hypertension have added spironolactone.  She'll obtain a BMP in 2  weeks.  She is scheduled for complete physical examination in about a month. I also advised her not to use furosemide on a daily basis.  She has been using it 5 days a week, advised her to cut it down to 3 days a week to be taken on a p.r.n. basis for dyspnea or leg edema.  She has not had any recurrence of angina pectoris.  I have again discussed with her regarding going on a vegetarian diet.  Dyspnea on exertion is also related to obesity hypoventilation. I'll see her back in 6 months or sooner if problems  Adrian Prows, MD, [facility] 09/04/2018, 1:05 PM [facility]. [place] Pager: [phone] Office: [phone] If no answer Cell [phone]

## 2018-09-23 ENCOUNTER — Other Ambulatory Visit: Payer: Self-pay | Admitting: Cardiology

## 2018-09-24 LAB — BASIC METABOLIC PANEL
BUN/Creatinine Ratio: 38 — ABNORMAL HIGH (ref 12–28)
BUN: 20 mg/dL (ref 8–27)
CALCIUM: 9.5 mg/dL (ref 8.7–10.3)
CHLORIDE: 99 mmol/L (ref 96–106)
CO2: 25 mmol/L (ref 20–29)
Creatinine, Ser: 0.53 mg/dL — ABNORMAL LOW (ref 0.57–1.00)
GFR calc non Af Amer: 92 mL/min/{1.73_m2} (ref >59)
GFR, EST AFRICAN AMERICAN: 106 mL/min/{1.73_m2} (ref >59)
Glucose: 101 mg/dL — ABNORMAL HIGH (ref 65–99)
POTASSIUM: 4.6 mmol/L (ref 3.5–5.2)
Sodium: 141 mmol/L (ref 134–144)

## 2018-10-13 ENCOUNTER — Encounter (HOSPITAL_COMMUNITY): Payer: Self-pay

## 2018-10-13 ENCOUNTER — Emergency Department (HOSPITAL_COMMUNITY)
Admission: EM | Admit: 2018-10-13 | Discharge: 2018-10-14 | Disposition: A | Discharge status: Home / Self Care | Payer: Medicare Other | Attending: Emergency Medicine | Admitting: Emergency Medicine

## 2018-10-13 DIAGNOSIS — Z96651 Presence of right artificial knee joint: Secondary | ICD-10-CM | POA: Insufficient documentation

## 2018-10-13 DIAGNOSIS — I509 Heart failure, unspecified: Secondary | ICD-10-CM | POA: Diagnosis not present

## 2018-10-13 DIAGNOSIS — E119 Type 2 diabetes mellitus without complications: Secondary | ICD-10-CM | POA: Diagnosis not present

## 2018-10-13 DIAGNOSIS — R1310 Dysphagia, unspecified: Secondary | ICD-10-CM | POA: Diagnosis present

## 2018-10-13 DIAGNOSIS — I2510 Atherosclerotic heart disease of native coronary artery without angina pectoris: Secondary | ICD-10-CM | POA: Diagnosis not present

## 2018-10-13 DIAGNOSIS — Z87891 Personal history of nicotine dependence: Secondary | ICD-10-CM | POA: Diagnosis not present

## 2018-10-13 DIAGNOSIS — Z79899 Other long term (current) drug therapy: Secondary | ICD-10-CM | POA: Diagnosis not present

## 2018-10-13 DIAGNOSIS — T782XXA Anaphylactic shock, unspecified, initial encounter: Secondary | ICD-10-CM | POA: Insufficient documentation

## 2018-10-13 DIAGNOSIS — Z7982 Long term (current) use of aspirin: Secondary | ICD-10-CM | POA: Insufficient documentation

## 2018-10-13 DIAGNOSIS — I110 Hypertensive heart disease with heart failure: Secondary | ICD-10-CM | POA: Diagnosis not present

## 2018-10-13 MED ORDER — DIPHENHYDRAMINE HCL 50 MG/ML IJ SOLN
25.0000 mg | Freq: Once | INTRAMUSCULAR | Status: AC
  Administered 2018-10-13: 25 mg via INTRAVENOUS
  Filled 2018-10-13: qty 1

## 2018-10-13 MED ORDER — METHYLPREDNISOLONE SODIUM SUCC 125 MG IJ SOLR
125.0000 mg | Freq: Once | INTRAMUSCULAR | Status: AC
  Administered 2018-10-13: 23:00:00 125 mg via INTRAVENOUS
  Filled 2018-10-13: qty 2

## 2018-10-13 MED ORDER — FAMOTIDINE IN NACL 20-0.9 MG/50ML-% IV SOLN
20.0000 mg | Freq: Once | INTRAVENOUS | Status: AC
  Administered 2018-10-13: 20 mg via INTRAVENOUS
  Filled 2018-10-13: qty 50

## 2018-10-13 NOTE — ED Triage Notes (Signed)
Pt arrived stating she is having an allergic reaction to metamucil about an hour ago. Pt has a rash, eye swelling, states some soreness in her throat. Pt stating she vomited.

## 2018-10-13 NOTE — ED Provider Notes (Signed)
[facility] DEPT Provider Note   CSN: [id number] Arrival date & time: 10/13/18  2147    History   Chief Complaint Chief Complaint  Patient presents with  . Allergic Reaction    HPI Julie Graves is a 78 y.o. female.     The history is provided by the patient.  Allergic Reaction  Presenting symptoms: difficulty swallowing, itching, rash and swelling   Severity:  Severe Duration:  1 hour Prior allergic episodes:  No prior episodes Context: medications   Context comment:  Took a tsp of metamucil and 20min later started to have swelling of her eyes and face.  itching everywhere and then vomiting Relieved by:  None tried Worsened by:  Nothing Ineffective treatments:  None tried   Past Medical History:  Diagnosis Date  . Anginal pain ([place])    "small pain?nerves in chest"started 10/05/13   . Anginal pain ([place])    cleared by cardiology 3/15  . Anxiety    rx given recent not taken yet  . Aortic insufficiency    mild on 9/11 cath  . CAD (coronary artery disease)    nonobstructive let heart cath 3/11: 40% ostial D1, 30% mid CFX  . CHF (congestive heart failure) ([place])   . Colon polyps 02/01/2009    MULTIPLE FRAGMENTS OF TUBULAR ADENOMAS  . Diabetes mellitus    no meds  . Frozen shoulder   . GERD (gastroesophageal reflux disease)    occ  . History of blood transfusion    pregnancy  . HLD (hyperlipidemia)   . HTN (hypertension)    ACEI cough  . Hx of cardiovascular stress test    Lexiscan Myoview (10/2013):  Fixed ant defect most c/w breast attenuation, cannot exclude apical infarct; no ischemia, EF 46%; Low Risk  . Nonischemic cardiomyopathy ([place])    Ech 3/11 difficult study, moderate aortic insuficiency noted. Left evntriculogram 3/11  . Obese   . Osteoarthritis   . Palpitation    PACs noted on telemetry while in the hospital  . Pneumonia    hx  . Stress incontinence     Patient Active Problem List   Diagnosis Date Noted  .  Precordial pain   . Angina pectoris ([place]) 08/13/2017  . Mixed dyslipidemia 08/13/2017  . CAD (coronary artery disease), native coronary artery 08/13/2017  . Mild episode of recurrent major depressive disorder ([place]) 11/03/2013  . Urge incontinence 04/02/2013  . Spinal stenosis, lumbar region, without neurogenic claudication 10/27/2012  . Spinal stenosis of lumbar region 10/01/2012  . Allergic rhinitis 09/26/2011  . Dizziness 01/23/2011  . Dyspnea 01/23/2011  . Type II diabetes mellitus ([place]) 06/27/2010  . Morbid obesity ([place]) 06/27/2010  . Impaired glucose tolerance 06/27/2010  . Disorder of bone and cartilage 06/22/2010  . Primary cardiomyopathy ([place]) 03/23/2010  . HYPERLIPIDEMIA-MIXED 03/07/2010  . Essential hypertension 03/07/2010  . CARDIOMEGALY 11/30/2009  . OTHER DYSPNEA AND RESPIRATORY ABNORMALITIES 11/30/2009  . AORTIC REGURGITATION 09/28/2009  . Chronic diastolic heart failure ([place]) 09/28/2009  . Aortic regurgitation 09/28/2009  . 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 Osteoarthrosis involving multiple sites 10/26/2008    Past Surgical History:  Procedure Laterality Date  . ABDOMINAL HYSTERECTOMY    . APPENDECTOMY    . BLADDER NECK SUSPENSION  85  . EYE SURGERY    . HAND SURGERY     right "had knots cut out"  . KNEE ARTHROPLASTY Right 12/09/2013   Procedure: COMPUTER ASSISTED Right TOTAL KNEE ARTHROPLASTY;  Surgeon: Marybelle Killings,  MD;  Location: [facility];  Service: Orthopedics;  Laterality: Right;  . LEFT HEART CATH AND CORONARY ANGIOGRAPHY N/A 08/15/2017   Procedure: LEFT HEART CATH AND CORONARY ANGIOGRAPHY;  Surgeon: Jettie Booze, MD;  Location: [facility];  Service: Cardiovascular;  Laterality: N/A;  . TUBAL LIGATION       OB History   No obstetric history on file.      Home Medications    Prior to Admission medications   Medication Sig Start Date End Date Taking? Authorizing Provider  acetaminophen (TYLENOL) 500 MG tablet Take 1,000 mg by  mouth every 6 (six) hours as needed for moderate pain or headache.     [provider]  alendronate (FOSAMAX) 70 MG tablet Take 70 mg by mouth every Monday. Take with a full glass of water on an empty stomach. Take on mondays    [provider]  aspirin 81 MG tablet Take 81 mg by mouth 2 (two) times daily.     [provider]  Calcium Carb-Cholecalciferol (CALCIUM 600/VITAMIN D3 PO) Take 1 tablet by mouth 2 (two) times daily.    [provider]  carvedilol (COREG) 6.25 MG tablet Take 6.25 mg by mouth 2 (two) times daily.      [provider]  cetirizine (ZYRTEC) 10 MG tablet Take 10 mg by mouth as needed.     [provider]  Cholecalciferol (VITAMIN D) 2000 units CAPS Take 2,000 Units by mouth daily.     [provider]  fluticasone (FLONASE) 50 MCG/ACT nasal spray Place 1 spray into both nostrils daily as needed for allergies or rhinitis.    [provider]  furosemide (LASIX) 20 MG tablet Take 20 mg by mouth daily as needed for fluid.  07/01/13   Larey Dresser, MD  HYDROcodone-acetaminophen (NORCO/VICODIN) 5-325 MG tablet Take 1 tablet by mouth every 6 (six) hours as needed for moderate pain.    [provider]  isosorbide mononitrate (IMDUR) 30 MG 24 hr tablet Take 1 tablet (30 mg total) by mouth daily. 09/13/17 12/12/17  Richardo Priest, MD  losartan (COZAAR) 50 MG tablet TAKE 1 TABLET BY MOUTH EVERY DAY 08/13/18   Adrian Prows, MD  nitroGLYCERIN (NITROSTAT) 0.4 MG SL tablet Place 1 tablet (0.4 mg total) under the tongue every 5 (five) minutes as needed. Patient taking differently: Place 0.4 mg under the tongue every 5 (five) minutes as needed for chest pain.  08/13/17 11/11/17  Revankar, Reita Cliche, MD  pantoprazole (PROTONIX) 20 MG tablet Take 20 mg by mouth as needed.     [provider]  potassium chloride SA (KLOR-CON M20) 20 MEQ tablet Take 20 mEq by mouth daily as needed (with Lasix).     [provider]  simvastatin (ZOCOR) 40 MG tablet Take 40 mg by mouth at bedtime.      [provider]  spironolactone (ALDACTONE) 25 MG tablet Take 1 tablet (25 mg total) by mouth daily. 09/04/18 12/03/18  Adrian Prows, MD    Family History Family History  Problem Relation Age of Onset  . Heart attack Father   . Heart attack Sister   . Stroke Mother 46  . Diabetes Sister   . Heart disease Sister   . Diabetes Brother   . Colon cancer Brother     Social History Social History   Tobacco Use  . Smoking status: Former Smoker    Packs/day: 1.00    Years: 15.00  Pack years: 15.00    Types: Cigarettes    Last attempt to quit: 10/10/1983    Years since quitting: 35.0  . Smokeless tobacco: Never Used  . Tobacco comment: Quit 1970s  Substance Use Topics  . Alcohol use: No    Alcohol/week: 0.0 standard drinks  . Drug use: No     Allergies   Shellfish allergy; Iodine; Lisinopril; Tramadol; and Penicillins   Review of Systems Review of Systems  HENT: Positive for trouble swallowing.   Skin: Positive for itching and rash.  All other systems reviewed and are negative.    Physical Exam Updated Vital Signs BP (!) 99/44 (BP Location: Right Wrist)   Pulse (!) 115   Temp 98.5 F (36.9 C) (Oral)   Resp 18   Ht 5' (1.524 m)   Wt 99.8 kg   SpO2 94%   BMI 42.97 kg/m   Physical Exam Vitals signs and nursing note reviewed.  Constitutional:      General: She is in acute distress.     Appearance: She is well-developed. She is obese.  HENT:     Head: Normocephalic and atraumatic.     Comments: Periorbital edema bilaterally    Mouth/Throat:     Mouth: Mucous membranes are moist.     Comments: No tongue or uvula swelling.  No stridor or voice changes Eyes:     Pupils: Pupils are equal, round, and reactive to light.  Cardiovascular:     Rate and Rhythm: Regular rhythm. Tachycardia present.     Pulses: Normal pulses.     Heart sounds: Normal heart sounds. No murmur. No friction  rub.  Pulmonary:     Effort: Pulmonary effort is normal.     Breath sounds: Normal breath sounds. No wheezing or rales.  Abdominal:     General: Bowel sounds are normal. There is no distension.     Palpations: Abdomen is soft.     Tenderness: There is no abdominal tenderness. There is no guarding or rebound.  Musculoskeletal: Normal range of motion.        General: No tenderness.     Comments: No edema  Skin:    General: Skin is warm and dry.     Findings: Rash present.     Comments: Diffuse urticarial rash over the face, neck, upper ext and abd.  Neurological:     Mental Status: She is alert and oriented to person, place, and time.     Cranial Nerves: No cranial nerve deficit.  Psychiatric:     Comments: cooperative      ED Treatments / Results  Labs (all labs ordered are listed, but only abnormal results are displayed) Labs Reviewed - No data to display  EKG None  Radiology No results found.  Procedures Procedures (including critical care time)  Medications Ordered in ED Medications  methylPREDNISolone sodium succinate (SOLU-MEDROL) 125 mg/2 mL injection 125 mg (has no administration in time range)  famotidine (PEPCID) IVPB 20 mg premix (has no administration in time range)  diphenhydrAMINE (BENADRYL) injection 25 mg (has no administration in time range)     Initial Impression / Assessment and Plan / ED Course  I have reviewed the triage vital signs and the nursing notes.  Pertinent labs & imaging results that were available during my care of the patient were reviewed by me and considered in my medical decision making (see chart for details).        Patient presenting with an acute allergic reaction most  likely to Metamucil.  She took that and 30 minutes later she was having itching, rash, facial swelling, vomiting.  Patient is tachycardic and soft blood pressures with oxygen saturation of 90 to 94%.  She is not wheezing and denies shortness of breath but does  have a scratchiness in her throat.  No notable oral swelling.  Patient started on Solu-Medrol, Benadryl, Pepcid.  Will watch closely for improvement.  Would like to avoid epi if possible due to patient's history of nonischemic cardiomyopathy and coronary artery disease.  Final Clinical Impressions(s) / ED Diagnoses   Final diagnoses:  None    ED Discharge Orders    None       Blanchie Dessert, MD 10/15/18 2050

## 2018-10-14 MED ORDER — EPINEPHRINE 0.3 MG/0.3ML IJ SOAJ: 0.3000 mg | INTRAMUSCULAR | 0 refills | Status: DC | PRN

## 2018-10-14 MED ORDER — PREDNISONE 20 MG PO TABS: 40.0000 mg | ORAL_TABLET | Freq: Every day | ORAL | 0 refills | Status: DC

## 2018-10-14 MED ORDER — FAMOTIDINE 20 MG PO TABS: 20.0000 mg | ORAL_TABLET | Freq: Two times a day (BID) | ORAL | 0 refills | Status: DC

## 2018-10-14 MED ORDER — EPINEPHRINE 0.3 MG/0.3ML IJ SOAJ
0.3000 mg | Freq: Once | INTRAMUSCULAR | Status: AC
  Administered 2018-10-14: 02:00:00 0.3 mg via INTRAMUSCULAR
  Filled 2018-10-14: qty 0.3

## 2018-10-14 NOTE — ED Provider Notes (Signed)
Patient signed out pending reassessment.  She was treated initially for an allergic reaction with Benadryl, Pepcid, steroids.  On reassessment, she continues to complain of sore throat and "difficulty swallowing."  She objectively has continued persistent swelling around the eyes.  Hemodynamically, her vital signs have improved.  She is not wheezing.  However, given that she subjectively still feels like her throat is swelling and with review of her initial vital signs which may have been suggestive of anaphylaxis, she will be given an EpiPen and monitor closely for further improvement.  2:25 AM Patient reports that she is feeling better after an epinephrine.  We will continue to monitor.  5:49 AM Patient resting comfortably.  I swelling much improved and she states that her throat feels much better.  Will discharge home with prednisone, Pepcid, EpiPen.  CRITICAL CARE Performed by: Merryl Hacker   Total critical care time: 35 minutes  Critical care time was exclusive of separately billable procedures and treating other patients.  Critical care was necessary to treat or prevent imminent or life-threatening deterioration.  Critical care was time spent personally by me on the following activities: development of treatment plan with patient and/or surrogate as well as nursing, discussions with consultants, evaluation of patient's response to treatment, examination of patient, obtaining history from patient or surrogate, ordering and performing treatments and interventions, ordering and review of laboratory studies, ordering and review of radiographic studies, pulse oximetry and re-evaluation of patient's condition.  DIagnosis:  Anaphylaxis   Merryl Hacker, MD 10/14/18 [phone]

## 2018-10-14 NOTE — Discharge Instructions (Addendum)
You had an allergic reaction.  Your symptoms were suggestive of anaphylaxis.  Avoid Metamucil.  If you have recurrence of symptoms including shortness of breath, throat swelling, you should use an EpiPen.  Take prednisone for the next 4 days.  Takes Pepcid as well.

## 2018-10-14 NOTE — ED Notes (Signed)
Pt to await for family to come around 7am.

## 2018-12-07 ENCOUNTER — Other Ambulatory Visit: Payer: Self-pay | Admitting: Cardiology

## 2018-12-07 DIAGNOSIS — I10 Essential (primary) hypertension: Secondary | ICD-10-CM

## 2018-12-07 DIAGNOSIS — I5032 Chronic diastolic (congestive) heart failure: Secondary | ICD-10-CM

## 2019-01-01 ENCOUNTER — Other Ambulatory Visit: Payer: Self-pay | Admitting: Physician Assistant

## 2019-01-01 DIAGNOSIS — Z1231 Encounter for screening mammogram for malignant neoplasm of breast: Secondary | ICD-10-CM

## 2019-02-16 ENCOUNTER — Ambulatory Visit
Admission: RE | Admit: 2019-02-16 | Discharge: 2019-02-16 | Disposition: A | Discharge status: Home / Self Care | Payer: Medicare Other | Source: Ambulatory Visit | Attending: Physician Assistant | Admitting: Physician Assistant

## 2019-02-16 ENCOUNTER — Other Ambulatory Visit: Payer: Self-pay

## 2019-02-16 DIAGNOSIS — Z1231 Encounter for screening mammogram for malignant neoplasm of breast: Secondary | ICD-10-CM

## 2019-03-09 ENCOUNTER — Encounter: Payer: Self-pay | Admitting: Cardiology

## 2019-03-09 ENCOUNTER — Ambulatory Visit: Payer: Medicare Other | Admitting: Cardiology

## 2019-03-09 ENCOUNTER — Other Ambulatory Visit: Payer: Self-pay

## 2019-03-09 VITALS — BP 134/71 | HR 55 | Temp 96.9°F | Ht 60.0 in | Wt 232.6 lb

## 2019-03-09 DIAGNOSIS — E7800 Pure hypercholesterolemia, unspecified: Secondary | ICD-10-CM | POA: Diagnosis not present

## 2019-03-09 DIAGNOSIS — R7303 Prediabetes: Secondary | ICD-10-CM

## 2019-03-09 DIAGNOSIS — I10 Essential (primary) hypertension: Secondary | ICD-10-CM | POA: Diagnosis not present

## 2019-03-09 DIAGNOSIS — I5032 Chronic diastolic (congestive) heart failure: Secondary | ICD-10-CM | POA: Diagnosis not present

## 2019-03-09 DIAGNOSIS — Z6841 Body Mass Index (BMI) 40.0 and over, adult: Secondary | ICD-10-CM

## 2019-03-09 NOTE — Progress Notes (Signed)
Subjective:  Primary Physician:  Harrison Mons, PA  Patient ID: Julie Graves, female    DOB: 16-May-1941, 78 y.o.   MRN: [id number]  Chief Complaint  Patient presents with  . diastolic heart failure  . lab results  . Follow-up    48mo    HPI: Julie Graves  is a 78 y.o. female  with  H/O morbid obesity, , nonischemic cardiomyopathy with EF 40% by echo in June 2019. Coronary angiogram on 08/21/17 and revealed mid circumflex 50% and small with 2 distal diagonal 80% stenosis. She has mild Chronic stable angina pectoris, mild to moderate aortic insufficiency, pre diabetes (Patient reluctant to starting metformin due to side-effects), hypertension, hyperlipidemia, former tobacco use with 15-pack-year history. This is six-month office visit and follow-up of chronic diastolic heart failure, hypertension and CAD.  She is tolerating spironolactone.  No change in dyspnea, leg edema is stable.  She is wondering if she can take furosemide on a p.r.n. basis.  Past Medical History:  Diagnosis Date  . Anginal pain ([place])    "small pain?nerves in chest"started 10/05/13   . Anginal pain ([place])    cleared by cardiology 3/15  . Anxiety    rx given recent not taken yet  . Aortic insufficiency    mild on 9/11 cath  . CAD (coronary artery disease)    nonobstructive let heart cath 3/11: 40% ostial D1, 30% mid CFX  . CHF (congestive heart failure) ([place])   . Colon polyps 02/01/2009    MULTIPLE FRAGMENTS OF TUBULAR ADENOMAS  . Diabetes mellitus    no meds  . Frozen shoulder   . GERD (gastroesophageal reflux disease)    occ  . History of blood transfusion    pregnancy  . HLD (hyperlipidemia)   . HTN (hypertension)    ACEI cough  . Hx of cardiovascular stress test    Lexiscan Myoview (10/2013):  Fixed ant defect most c/w breast attenuation, cannot exclude apical infarct; no ischemia, EF 46%; Low Risk  . Nonischemic cardiomyopathy ([place])    Ech 3/11 difficult study, moderate aortic  insuficiency noted. Left evntriculogram 3/11  . Obese   . Osteoarthritis   . Palpitation    PACs noted on telemetry while in the hospital  . Pneumonia    hx  . Stress incontinence     Past Surgical History:  Procedure Laterality Date  . ABDOMINAL HYSTERECTOMY    . APPENDECTOMY    . BLADDER NECK SUSPENSION  85  . EYE SURGERY    . HAND SURGERY     right "had knots cut out"  . KNEE ARTHROPLASTY Right 12/09/2013   Procedure: COMPUTER ASSISTED Right TOTAL KNEE ARTHROPLASTY;  Surgeon: Marybelle Killings, MD;  Location: [facility];  Service: Orthopedics;  Laterality: Right;  . LEFT HEART CATH AND CORONARY ANGIOGRAPHY N/A 08/15/2017   Procedure: LEFT HEART CATH AND CORONARY ANGIOGRAPHY;  Surgeon: Jettie Booze, MD;  Location: [facility];  Service: Cardiovascular;  Laterality: N/A;  . TUBAL LIGATION      Social History   Socioeconomic History  . Marital status: Married    Spouse name: Not on file  . Number of children: 3  . Years of education: Not on file  . Highest education level: Not on file  Occupational History  . Not on file  Social Needs  . Financial resource strain: Not on file  . Food insecurity    Worry: Not on file    Inability: Not  on file  . Transportation needs    Medical: Not on file    Non-medical: Not on file  Tobacco Use  . Smoking status: Former Smoker    Packs/day: 1.00    Years: 15.00    Pack years: 15.00    Types: Cigarettes    Quit date: 10/10/1983    Years since quitting: 35.4  . Smokeless tobacco: Never Used  . Tobacco comment: Quit 1970s  Substance and Sexual Activity  . Alcohol use: No    Alcohol/week: 0.0 standard drinks  . Drug use: No  . Sexual activity: Not on file  Lifestyle  . Physical activity    Days per week: Not on file    Minutes per session: Not on file  . Stress: Not on file  Relationships  . Social Herbalist on phone: Not on file    Gets together: Not on file    Attends religious service: Not on file     Active member of club or organization: Not on file    Attends meetings of clubs or organizations: Not on file    Relationship status: Not on file  . Intimate partner violence    Fear of current or ex partner: Not on file    Emotionally abused: Not on file    Physically abused: Not on file    Forced sexual activity: Not on file  Other Topics Concern  . Not on file  Social History Narrative  . Not on file    Current Outpatient Medications on File Prior to Visit  Medication Sig Dispense Refill  . acetaminophen (TYLENOL) 500 MG tablet Take 1,000 mg by mouth every 6 (six) hours as needed for moderate pain or headache.     . alendronate (FOSAMAX) 70 MG tablet Take 70 mg by mouth every Monday. Take with a full glass of water on an empty stomach. Take on mondays    . aspirin 81 MG tablet Take 81 mg by mouth daily.     . Calcium Carb-Cholecalciferol (CALCIUM 600/VITAMIN D3 PO) Take 1 tablet by mouth daily.     . carvedilol (COREG) 6.25 MG tablet Take 6.25 mg by mouth 2 (two) times daily.      . cetirizine (ZYRTEC) 10 MG tablet Take 10 mg by mouth daily as needed for allergies.     . Cholecalciferol (VITAMIN D) 2000 units CAPS Take 2,000 Units by mouth daily.     Marland Kitchen EPINEPHrine 0.3 mg/0.3 mL IJ SOAJ injection Inject 0.3 mLs (0.3 mg total) into the muscle as needed for anaphylaxis. 1 Device 0  . fluticasone (FLONASE) 50 MCG/ACT nasal spray Place 1 spray into both nostrils daily as needed for allergies or rhinitis.    . furosemide (LASIX) 20 MG tablet Take 20 mg by mouth daily as needed for fluid or edema.    Marland Kitchen HYDROcodone-acetaminophen (NORCO/VICODIN) 5-325 MG tablet Take 1 tablet by mouth every 6 (six) hours as needed for moderate pain.    Marland Kitchen losartan (COZAAR) 50 MG tablet TAKE 1 TABLET BY MOUTH EVERY DAY (Patient taking differently: Take 50 mg by mouth daily. ) 90 tablet 3  . nitroGLYCERIN (NITROSTAT) 0.4 MG SL tablet Place 1 tablet (0.4 mg total) under the tongue every 5 (five) minutes as needed.  (Patient taking differently: Place 0.4 mg under the tongue every 5 (five) minutes as needed for chest pain. ) 11 tablet 6  . potassium chloride SA (K-DUR) 20 MEQ tablet Take 20 mEq by mouth daily  as needed (With Lasix).     Marland Kitchen simvastatin (ZOCOR) 40 MG tablet Take 40 mg by mouth at bedtime.      Marland Kitchen spironolactone (ALDACTONE) 25 MG tablet TAKE 1 TABLET(25 MG) BY MOUTH DAILY 30 tablet 2   No current facility-administered medications on file prior to visit.    Review of Systems  Constitution: Negative for chills, decreased appetite, malaise/fatigue and weight gain.  Cardiovascular: Positive for dyspnea on exertion and leg swelling. Negative for syncope.  Endocrine: Negative for cold intolerance.  Hematologic/Lymphatic: Does not bruise/bleed easily.  Musculoskeletal: Positive for joint pain. Negative for joint swelling.  Gastrointestinal: Negative for abdominal pain, anorexia, change in bowel habit, hematochezia and melena.  Neurological: Negative for headaches and light-headedness.  Psychiatric/Behavioral: Negative for depression and substance abuse.  All other systems reviewed and are negative.    Objective:  Blood pressure 134/71, pulse (!) 55, temperature (!) 96.9 F (36.1 C), height 5' (1.524 m), weight 232 lb 9.6 oz (105.5 kg), SpO2 97 %. Body mass index is 45.43 kg/m.  Physical Exam  Constitutional: She appears well-developed. No distress.  Morbidly obese  HENT:  Head: Atraumatic.  Eyes: Conjunctivae are normal.  Neck: Neck supple. No thyromegaly present.  Short neck and difficult to evaluate JVP  Cardiovascular: Normal rate, regular rhythm, normal heart sounds, intact distal pulses and normal pulses. Exam reveals no gallop.  No murmur heard. Femoral and popliteal pulse difficult to feel due to patient's body habitus.  1-2+ bilateral pitting edema. No JVD  Pulmonary/Chest: Effort normal and breath sounds normal.  Abdominal: Soft. Bowel sounds are normal.  Pannus present   Musculoskeletal: Normal range of motion.  Neurological: She is alert.  Skin: Skin is warm and dry.  Psychiatric: She has a normal mood and affect.   CARDIAC STUDIES:   Stress nuclear study 11/05/2013:  Exercise Capacity:  Lexiscan with no exercise. BP Response:  Normal blood pressure response. Clinical Symptoms:  There is dyspnea. ECG Impression:  No significant ECG changes with Lexiscan. Comparison with Prior Nuclear Study: No images to compare to, however the clinical repeat appears to be similar. Overall Impression:  Low risk stress nuclear study With mild apical breast attenuation. Cannot exclude small apical infarct. LV Wall Motion:  Unable to adequately it says the overall cardiac function as the gated images were not adequately established to capture wall motion.  Echocardiogram 12/20/2017: Left ventricle cavity is normal in size. Moderate concentric hypertrophy of the left ventricle. Mild decrease in global wall motion. Visual EF is 40-45%. Doppler evidence of grade I (impaired) diastolic dysfunction, normal LAP. Calculated EF 42%. Left atrial cavity is moderately dilated. Mild aortic valve leaflet calcification. Mild aortic valve stenosis. Aortic valve mean gradient of 7 mmHg, Vmax of 1.8 m/s. Calculated aortic valve area by continuity equation is 1.6 cm. Mild to moderate aortic regurgitation. Mild (Grade I) mitral regurgitation. Mild tricuspid regurgitation. No evidence of pulmonary hypertension.  Coronary angiogram 07/2017: mid circumflex 50%, D1 ostial 50% and mid 80%, very small. Mild noncritical CAD other vessels. EF 45%.   Recent Labs:  Labs 01/27/2019: A1c 5.9%.  Total cholesterol 148, triglycerides 140, HDL 61, LDL 57.  TSH normal.  CMP Latest Ref Rng & Units 09/23/2018 08/15/2017 07/09/2014  Glucose 65 - 99 mg/dL 101(H) 151(H) 90  BUN 8 - 27 mg/dL 20 21(H) 18  Creatinine 0.57 - 1.00 mg/dL 0.53(L) 0.66 0.70  Sodium 134 - 144 mmol/L 141 138 138  Potassium 3.5 - 5.2 mmol/L 4.6  4.4 4.5  Chloride 96 - 106 mmol/L 99 103 101  CO2 20 - 29 mmol/L 25 24 26   Calcium 8.7 - 10.3 mg/dL 9.5 9.5 9.5  Total Protein 6.0 - 8.3 g/dL - - -  Total Bilirubin 0.3 - 1.2 mg/dL - - -  Alkaline Phos 39 - 117 U/L - - -  AST 0 - 37 U/L - - -  ALT 0 - 35 U/L - - -   CBC Latest Ref Rng & Units 08/13/2017 07/09/2014 12/11/2013  WBC 3.4 - 10.8 x10E3/uL 7.3 6.8 10.1  Hemoglobin 11.1 - 15.9 g/dL 13.1 12.7 10.6(L)  Hematocrit 34.0 - 46.6 % 40.6 38.6 32.4(L)  Platelets 150 - 379 x10E3/uL 281 278 221   Assessment & Recommendations:   Chronic diastolic heart failure (HCC) - Plan: EKG 12-Lead  Essential hypertension  Class 3 severe obesity due to excess calories with serious comorbidity and body mass index (BMI) of 40.0 to 44.9 in adult [facility])  EKG 03/09/2019: Marked sinus bradycardia at rate of 57 bpm, left atrial abnormality.  Left axis deviation, left anterior fascicular block.  IVCD, LVH.  Nonspecific T abnormality. No significant change from  EKG 09/04/2018   Recommendation:   Julie Graves is an AAF with H/O morbid obesity, , nonischemic cardiomyopathy with EF 40% by echo in June 2019. Coronary angiogram on 08/21/17 and revealed mid circumflex 50% and small with 2 distal diagonal 80% stenosis. hypertension, hyperlipidemia, former tobacco use with 15-pack-year history.   Patient is here on a six-month office visit and follow-up of chronic diastolic heart failure, she has not had any acute decompensation although unfortunately has not had any luck in losing weight, In fact has gained weight since Covid 19.  Lipids are being managed by her PCP, Which I reviewed, lipids and excellent control with simvastatin, blood pressure is controlled today, She is tolerating spironolactone. Edema is also improved.  I reviewed her labs from PCP, labs are stable.  With regard to prediabetes, she does not want to be on metformin.  With regard to chronic diastolic heart failure, she could certainly take  furosemide on a p.r.n. basis. Dyspnea on exertion is also related to obesity hypoventilation. I'll see her back in 6 months or sooner if problems  Adrian Prows, MD, [facility] 03/09/2019, 11:48 AM [employer] [facility] [facility] [place] Pager: [phone] Office: [phone] If no answer Cell [phone]

## 2019-03-17 ENCOUNTER — Other Ambulatory Visit: Payer: Self-pay

## 2019-03-17 DIAGNOSIS — I10 Essential (primary) hypertension: Secondary | ICD-10-CM

## 2019-03-17 DIAGNOSIS — I5032 Chronic diastolic (congestive) heart failure: Secondary | ICD-10-CM

## 2019-03-17 MED ORDER — SPIRONOLACTONE 25 MG PO TABS: ORAL_TABLET | ORAL | 1 refills | Status: DC

## 2019-06-04 ENCOUNTER — Emergency Department (HOSPITAL_COMMUNITY): Payer: Medicare Other

## 2019-06-04 ENCOUNTER — Emergency Department (HOSPITAL_COMMUNITY)
Admission: EM | Admit: 2019-06-04 | Discharge: 2019-06-04 | Disposition: A | Discharge status: Home / Self Care | Payer: Medicare Other | Attending: Emergency Medicine | Admitting: Emergency Medicine

## 2019-06-04 ENCOUNTER — Encounter (HOSPITAL_COMMUNITY): Payer: Self-pay

## 2019-06-04 ENCOUNTER — Other Ambulatory Visit: Payer: Self-pay

## 2019-06-04 DIAGNOSIS — R2681 Unsteadiness on feet: Secondary | ICD-10-CM | POA: Diagnosis not present

## 2019-06-04 DIAGNOSIS — R410 Disorientation, unspecified: Secondary | ICD-10-CM | POA: Diagnosis not present

## 2019-06-04 DIAGNOSIS — Z7982 Long term (current) use of aspirin: Secondary | ICD-10-CM | POA: Insufficient documentation

## 2019-06-04 DIAGNOSIS — F419 Anxiety disorder, unspecified: Secondary | ICD-10-CM | POA: Insufficient documentation

## 2019-06-04 DIAGNOSIS — R0789 Other chest pain: Secondary | ICD-10-CM | POA: Diagnosis not present

## 2019-06-04 DIAGNOSIS — I110 Hypertensive heart disease with heart failure: Secondary | ICD-10-CM | POA: Insufficient documentation

## 2019-06-04 DIAGNOSIS — M79604 Pain in right leg: Secondary | ICD-10-CM | POA: Insufficient documentation

## 2019-06-04 DIAGNOSIS — Z87891 Personal history of nicotine dependence: Secondary | ICD-10-CM | POA: Insufficient documentation

## 2019-06-04 DIAGNOSIS — R202 Paresthesia of skin: Secondary | ICD-10-CM | POA: Insufficient documentation

## 2019-06-04 DIAGNOSIS — Z96651 Presence of right artificial knee joint: Secondary | ICD-10-CM | POA: Insufficient documentation

## 2019-06-04 DIAGNOSIS — R5383 Other fatigue: Secondary | ICD-10-CM | POA: Insufficient documentation

## 2019-06-04 DIAGNOSIS — E119 Type 2 diabetes mellitus without complications: Secondary | ICD-10-CM | POA: Diagnosis not present

## 2019-06-04 DIAGNOSIS — I2510 Atherosclerotic heart disease of native coronary artery without angina pectoris: Secondary | ICD-10-CM | POA: Insufficient documentation

## 2019-06-04 DIAGNOSIS — R42 Dizziness and giddiness: Secondary | ICD-10-CM | POA: Diagnosis not present

## 2019-06-04 DIAGNOSIS — I5032 Chronic diastolic (congestive) heart failure: Secondary | ICD-10-CM | POA: Insufficient documentation

## 2019-06-04 DIAGNOSIS — Z79899 Other long term (current) drug therapy: Secondary | ICD-10-CM | POA: Insufficient documentation

## 2019-06-04 LAB — TROPONIN I (HIGH SENSITIVITY)
Troponin I (High Sensitivity): 5 ng/L (ref <18)
Troponin I (High Sensitivity): 9 ng/L (ref <18)

## 2019-06-04 LAB — BASIC METABOLIC PANEL
Anion gap: 8 (ref 5–15)
BUN: 25 mg/dL — ABNORMAL HIGH (ref 8–23)
CO2: 25 mmol/L (ref 22–32)
Calcium: 9.1 mg/dL (ref 8.9–10.3)
Chloride: 104 mmol/L (ref 98–111)
Creatinine, Ser: 0.67 mg/dL (ref 0.44–1.00)
GFR calc Af Amer: >60 mL/min (ref >60)
GFR calc non Af Amer: >60 mL/min (ref >60)
Glucose, Bld: 107 mg/dL — ABNORMAL HIGH (ref 70–99)
Potassium: 4 mmol/L (ref 3.5–5.1)
Sodium: 137 mmol/L (ref 135–145)

## 2019-06-04 LAB — CBC WITH DIFFERENTIAL/PLATELET
Abs Immature Granulocytes: 0.02 10*3/uL (ref 0.00–0.07)
Basophils Absolute: 0 10*3/uL (ref 0.0–0.1)
Basophils Relative: 0 %
Eosinophils Absolute: 0.2 10*3/uL (ref 0.0–0.5)
Eosinophils Relative: 3 %
HCT: 42.3 % (ref 36.0–46.0)
Hemoglobin: 13.3 g/dL (ref 12.0–15.0)
Immature Granulocytes: 0 %
Lymphocytes Relative: 18 %
Lymphs Abs: 1.2 10*3/uL (ref 0.7–4.0)
MCH: 31.1 pg (ref 26.0–34.0)
MCHC: 31.4 g/dL (ref 30.0–36.0)
MCV: 98.8 fL (ref 80.0–100.0)
Monocytes Absolute: 0.5 10*3/uL (ref 0.1–1.0)
Monocytes Relative: 8 %
Neutro Abs: 4.8 10*3/uL (ref 1.7–7.7)
Neutrophils Relative %: 71 %
Platelets: 219 10*3/uL (ref 150–400)
RBC: 4.28 MIL/uL (ref 3.87–5.11)
RDW: 13.7 % (ref 11.5–15.5)
WBC: 6.8 10*3/uL (ref 4.0–10.5)
nRBC: 0 % (ref 0.0–0.2)

## 2019-06-04 MED ORDER — DIAZEPAM 2 MG PO TABS
2.0000 mg | ORAL_TABLET | Freq: Once | ORAL | Status: AC
  Administered 2019-06-04: 2 mg via ORAL
  Filled 2019-06-04: qty 1

## 2019-06-04 NOTE — Discharge Instructions (Addendum)
You were assessed in the ED for your right leg weakness, your dizziness and your chest pressure.  We did an MRI of your head to look for stroke.  This was negative.  We did work-up to assess you for heart failure and fatigue.  This was also negative.  Please follow-up with your regular doctor and orthopedics for ongoing leg discomfort and weakness.  Please discontinue your topiramate and follow-up with your doctor regarding continuation of this medication.  Come back to the emergency room if you develop any new weakness, worsening pain, repeat dizziness, nausea vomiting or any other worrisome symptom.

## 2019-06-04 NOTE — ED Notes (Signed)
Sunquest not working. White stickers sent to lab with bloodwork. Secretary aware.

## 2019-06-04 NOTE — ED Notes (Signed)
Pt to MRI

## 2019-06-04 NOTE — ED Provider Notes (Signed)
[facility] DEPT Provider Note   CSN: [id number] Arrival date & time: 06/04/19  0708     History Chief Complaint  Patient presents with  . Leg Pain    Julie Graves is a 78 y.o. female.  Patient with a past medical history of Anginal pain ([place]), Anxiety, Aortic insufficiency, CAD, CHF, Diabetes mellitus, Frozen shoulder, GERD, HLD, HTN,, Nonischemic cardiomyopathy ([place]), Obese, Osteoarthritis, presents with 2 days of irregular right leg sensation.  Also reports 2 episodes of dizziness and confusion that occurred today when she tried to get out of bed to go to the restroom.  Also reports 1 month of fatigue for 1 month.  Patient has been on topiramate 25 mg nightly for weight loss.  Feels that her fatigue has been ongoing for this time.  Patient recently had a dose increase to 50 mg on 2 December.  Reports that she only took the increased dose 3 days ago.  Said that she did not feel well when she did this.  She then decreased to 25 mg 2 nights ago.  Patient then did not take it last night.  The history is provided by the patient.  Leg Pain Location:  Leg Time since incident:  2 days Leg location:  R leg Pain details:    Quality:  Unable to specify (Bugs crawling on my skin)   Radiates to: Posterior right knee to foot.   Severity:  Unable to specify   Onset quality:  Gradual   Timing:  Intermittent   Progression:  Worsening Chronicity:  New Dislocation: no   Foreign body present:  No foreign bodies Tetanus status:  Unknown Prior injury to area:  No Relieved by:  Nothing Worsened by:  Nothing Ineffective treatments:  None tried Associated symptoms: fatigue   Associated symptoms: no back pain, no decreased ROM, no fever, no itching, no muscle weakness, no neck pain, no numbness, no stiffness, no swelling and no tingling   Risk factors: obesity   Risk factors: no recent illness        Past Medical History:  Diagnosis Date  . Anginal pain  ([place])    "small pain?nerves in chest"started 10/05/13   . Anginal pain ([place])    cleared by cardiology 3/15  . Anxiety    rx given recent not taken yet  . Aortic insufficiency    mild on 9/11 cath  . CAD (coronary artery disease)    nonobstructive let heart cath 3/11: 40% ostial D1, 30% mid CFX  . CHF (congestive heart failure) ([place])   . Colon polyps 02/01/2009    MULTIPLE FRAGMENTS OF TUBULAR ADENOMAS  . Diabetes mellitus    no meds  . Frozen shoulder   . GERD (gastroesophageal reflux disease)    occ  . History of blood transfusion    pregnancy  . HLD (hyperlipidemia)   . HTN (hypertension)    ACEI cough  . Hx of cardiovascular stress test    Lexiscan Myoview (10/2013):  Fixed ant defect most c/w breast attenuation, cannot exclude apical infarct; no ischemia, EF 46%; Low Risk  . Nonischemic cardiomyopathy ([place])    Ech 3/11 difficult study, moderate aortic insuficiency noted. Left evntriculogram 3/11  . Obese   . Osteoarthritis   . Palpitation    PACs noted on telemetry while in the hospital  . Pneumonia    hx  . Stress incontinence     Patient Active Problem List   Diagnosis Date Noted  . Precordial pain   .  Angina pectoris ([place]) 08/13/2017  . Mixed dyslipidemia 08/13/2017  . CAD (coronary artery disease), native coronary artery 08/13/2017  . Mild episode of recurrent major depressive disorder ([place]) 11/03/2013  . Urge incontinence 04/02/2013  . Spinal stenosis, lumbar region, without neurogenic claudication 10/27/2012  . Spinal stenosis of lumbar region 10/01/2012  . Allergic rhinitis 09/26/2011  . Dizziness 01/23/2011  . Dyspnea 01/23/2011  . Type II diabetes mellitus ([place]) 06/27/2010  . Morbid obesity ([place]) 06/27/2010  . Impaired glucose tolerance 06/27/2010  . Disorder of bone and cartilage 06/22/2010  . Primary cardiomyopathy ([place]) 03/23/2010  . HYPERLIPIDEMIA-MIXED 03/07/2010  . Essential hypertension 03/07/2010  . CARDIOMEGALY 11/30/2009  . OTHER DYSPNEA  AND RESPIRATORY ABNORMALITIES 11/30/2009  . AORTIC REGURGITATION 09/28/2009  . Chronic diastolic heart failure ([place]) 09/28/2009  . Aortic regurgitation 09/28/2009  . 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 Osteoarthrosis involving multiple sites 10/26/2008    Past Surgical History:  Procedure Laterality Date  . ABDOMINAL HYSTERECTOMY    . APPENDECTOMY    . BLADDER NECK SUSPENSION  85  . EYE SURGERY    . HAND SURGERY     right "had knots cut out"  . KNEE ARTHROPLASTY Right 12/09/2013   Procedure: COMPUTER ASSISTED Right TOTAL KNEE ARTHROPLASTY;  Surgeon: Marybelle Killings, MD;  Location: [facility];  Service: Orthopedics;  Laterality: Right;  . LEFT HEART CATH AND CORONARY ANGIOGRAPHY N/A 08/15/2017   Procedure: LEFT HEART CATH AND CORONARY ANGIOGRAPHY;  Surgeon: Jettie Booze, MD;  Location: [facility];  Service: Cardiovascular;  Laterality: N/A;  . TUBAL LIGATION       OB History   No obstetric history on file.     Family History  Problem Relation Age of Onset  . Heart attack Father   . Heart attack Sister   . Stroke Mother 30  . Diabetes Sister   . Heart disease Sister   . Diabetes Brother   . Colon cancer Brother   . Breast cancer Neg Hx     Social History   Tobacco Use  . Smoking status: Former Smoker    Packs/day: 1.00    Years: 15.00    Pack years: 15.00    Types: Cigarettes    Quit date: 10/10/1983    Years since quitting: 35.6  . Smokeless tobacco: Never Used  . Tobacco comment: Quit 1970s  Substance Use Topics  . Alcohol use: No    Alcohol/week: 0.0 standard drinks  . Drug use: No    Home Medications Prior to Admission medications   Medication Sig Start Date End Date Taking? Authorizing Provider  acetaminophen (TYLENOL) 500 MG tablet Take 1,000 mg by mouth every 6 (six) hours as needed for moderate pain or headache.     [provider]  alendronate (FOSAMAX) 70 MG tablet Take 70 mg by mouth every Monday. Take with a full glass of water  on an empty stomach. Take on mondays    [provider]  aspirin 81 MG tablet Take 81 mg by mouth daily.     [provider]  Calcium Carb-Cholecalciferol (CALCIUM 600/VITAMIN D3 PO) Take 1 tablet by mouth daily.     [provider]  carvedilol (COREG) 6.25 MG tablet Take 6.25 mg by mouth 2 (two) times daily.      [provider]  cetirizine (ZYRTEC) 10 MG tablet Take 10 mg by mouth daily as needed for allergies.     [provider]  Cholecalciferol (VITAMIN D) 2000 units  CAPS Take 2,000 Units by mouth daily.     [provider]  EPINEPHrine 0.3 mg/0.3 mL IJ SOAJ injection Inject 0.3 mLs (0.3 mg total) into the muscle as needed for anaphylaxis. 10/14/18   Horton, Barbette Hair, MD  fluticasone (FLONASE) 50 MCG/ACT nasal spray Place 1 spray into both nostrils daily as needed for allergies or rhinitis.    [provider]  furosemide (LASIX) 20 MG tablet Take 20 mg by mouth daily as needed for fluid or edema.    [provider]  HYDROcodone-acetaminophen (NORCO/VICODIN) 5-325 MG tablet Take 1 tablet by mouth every 6 (six) hours as needed for moderate pain.    [provider]  losartan (COZAAR) 50 MG tablet TAKE 1 TABLET BY MOUTH EVERY DAY Patient taking differently: Take 50 mg by mouth daily.  08/13/18   Adrian Prows, MD  nitroGLYCERIN (NITROSTAT) 0.4 MG SL tablet Place 1 tablet (0.4 mg total) under the tongue every 5 (five) minutes as needed. Patient taking differently: Place 0.4 mg under the tongue every 5 (five) minutes as needed for chest pain.  08/13/17 03/09/19  Revankar, Reita Cliche, MD  potassium chloride SA (K-DUR) 20 MEQ tablet Take 20 mEq by mouth daily as needed (With Lasix).  01/27/19   [provider]  simvastatin (ZOCOR) 40 MG tablet Take 40 mg by mouth at bedtime.      [provider]  spironolactone (ALDACTONE) 25 MG tablet TAKE 1 TABLET(25 MG) BY MOUTH DAILY 03/17/19   Adrian Prows, MD    Allergies     Shellfish allergy, Iodine, Lisinopril, Metamucil [psyllium], Tramadol, and Penicillins  Review of Systems   Review of Systems  Constitutional: Positive for fatigue. Negative for fever.  HENT: Negative for congestion, trouble swallowing and voice change.   Respiratory: Positive for shortness of breath.        "Chest heaviness"  Cardiovascular: Negative for chest pain, palpitations and leg swelling.  Gastrointestinal: Negative.   Endocrine: Negative.   Musculoskeletal: Negative for back pain, neck pain and stiffness.  Skin: Negative for itching.  Neurological: Positive for dizziness and light-headedness. Negative for speech difficulty and headaches.    Physical Exam Updated Vital Signs BP (!) 128/115   Pulse 62   Temp 97.8 F (36.6 C) (Oral)   Resp 16   Ht 5' (1.524 m)   Wt 99.8 kg   SpO2 100%   BMI 42.97 kg/m   Physical Exam Vitals reviewed.  Constitutional:      General: She is not in acute distress.    Appearance: She is obese.  HENT:     Head: Normocephalic and atraumatic.     Nose: Nose normal. No congestion.     Mouth/Throat:     Mouth: Mucous membranes are moist.     Pharynx: Oropharynx is clear.  Eyes:     Extraocular Movements: Extraocular movements intact.     Pupils: Pupils are equal, round, and reactive to light.  Cardiovascular:     Rate and Rhythm: Normal rate and regular rhythm.     Heart sounds: No murmur. No friction rub. No gallop.   Pulmonary:     Effort: Pulmonary effort is normal. No respiratory distress.     Breath sounds: Normal breath sounds.  Abdominal:     General: Abdomen is flat. There is no distension.     Tenderness: There is no abdominal tenderness.  Musculoskeletal:     Cervical back: Normal range of motion. No tenderness.     Comments:  Difficulty ambulating at baseline, but worse today.  4 of 5 strength with right lower extremity at hip flexion, 5 of 5 strength with remainder of right lower extremity, 5 5 strength right lower  extremity throughout  Skin:    Findings: No rash.  Neurological:     General: No focal deficit present.     Mental Status: She is alert and oriented to person, place, and time.     Cranial Nerves: No cranial nerve deficit.     Comments: No pronator drift, no facial asymmetry, no dysmetria  Psychiatric:     Comments: Anxious affect     ED Results / Procedures / Treatments   Labs (all labs ordered are listed, but only abnormal results are displayed) Labs Reviewed  BASIC METABOLIC PANEL - Abnormal; Notable for the following components:      Result Value   Glucose, Bld 107 (*)    BUN 25 (*)    All other components within normal limits  CBC WITH DIFFERENTIAL/PLATELET  TROPONIN I (HIGH SENSITIVITY)  TROPONIN I (HIGH SENSITIVITY)    EKG EKG Interpretation  Date/Time:  Thursday June 04 2019 08:23:15 EST Ventricular Rate:  55 PR Interval:    QRS Duration: 132 QT Interval:  464 QTC Calculation: 444 R Axis:   -34 Text Interpretation: Sinus rhythm Short PR interval Nonspecific intraventricular conduction delay Borderline T abnormalities, diffuse leads Nonspecific T wave abnormality Confirmed by Ezequiel Essex [phone]) on 06/04/2019 8:30:08 AM   Radiology MR BRAIN WO CONTRAST  Result Date: 06/04/2019 CLINICAL DATA:  Focal neuro deficit, greater than 6 hours, stroke suspected. Additional history provided: Dizziness. EXAM: MRI HEAD WITHOUT CONTRAST TECHNIQUE: Multiplanar, multiecho pulse sequences of the brain and surrounding structures were obtained without intravenous contrast. COMPARISON:  Report from brain MRI 10/10/1998 (images unavailable) FINDINGS: Brain: Multiple sequences are mildly motion degraded. There is no evidence of acute infarct. No evidence of intracranial mass. No midline shift or extra-axial fluid collection. No chronic intracranial blood products. Moderate scattered T2/FLAIR hyperintensity within the cerebral white matter and pons is nonspecific, but consistent  with chronic small vessel ischemic disease. Cerebral volume is normal for age.  Partially empty sella turcica. Vascular: Flow voids maintained within the proximal large arterial vessels. Skull and upper cervical spine: No focal marrow lesion Sinuses/Orbits: Bilateral lens replacements. Mild scattered paranasal sinus mucosal thickening. No significant mastoid effusion. IMPRESSION: 1. Motion degraded exam. 2. No evidence of acute intracranial abnormality, including acute infarct. 3. Moderate chronic small vessel ischemic disease. Electronically Signed   By: Kellie Simmering DO   On: 06/04/2019 10:29    Procedures Procedures (including critical care time)  Medications Ordered in ED Medications  diazepam (VALIUM) tablet 2 mg (2 mg Oral Given 06/04/19 [id number])    ED Course  I have reviewed the triage vital signs and the nursing notes.  Pertinent labs & imaging results that were available during my care of the patient were reviewed by me and considered in my medical decision making (see chart for details).  Clinical Course as of Jun 04 1047  Thu Jun 04, 2019  0739 BP(!): 206/93 [AT]    Clinical Course User Index [AT] Bonnita Hollow, MD   MDM Rules/Calculators/A&P     CHA2DS2/VAS Stroke Risk Points      N/A >= 2 Points: High Risk  1 - 1.99 Points: Medium Risk  0 Points: Low Risk    A final score could not be computed because of missing components.: Last  Change: N/A  This score determines the patient's risk of having a stroke if the  patient has atrial fibrillation.      This score is not applicable to this patient. Components are not  calculated.                   Patient presenting with right leg paresthesia, 2 episodes of intermittent dizziness (now resolved), 1 month of "chest heaviness" and fatigue. Presentation does not nicely fit into one differential.  Symptoms seem to be related to the medication topiramate which can cause generalized fatigue.  Physical exam patient does not  appear to be in any acute heart failure, she is A&O x3, and has no cranial nerves so I do not feel that she has had a CVA to explain her dizziness and change in right leg paresthesia. Although she does have risk factors for TIA vs. CVA including DMT2, HTN, HLD. Patient's leg sensation described as "bugs crawling on legs" is consistent with formication, but patient does not have known risk factors such as history of depression, associated hallucinations, known drug abuse history. Sounds neuropathic in nature. No rash, so shingles is not likely at this time. DM is well controlled and presentation does not correspond with "stocking and glove" neuropathy.  Plan to rule out cardiac abnormalities such as MI and CHF with EKG, CXR. Will rule out anemia and infection with CBC. Will rule out electrolyte abnormalities with BMP. If negative, which is expected, plan to have patient discontinue totipalmate and follow up with PCP regarding ongoing issues.   Update EKG shows SR, nospecific T wave abnormalities, appears grossly unchanged since prior EKG.   Update Orthostatics normal  Update CBC normal. MRI shows no acute intracranial abnormalities. BMP pending.   Update BMP was normal.  No significant findings warranting admission.  Plan discharge patient follow-up with orthopedics for possible leg versus back chronic issue.  Recommend patient follow-up with PCP regarding continuation of topiramate, will discontinue for now.    Final Clinical Impression(s) / ED Diagnoses Final diagnoses:  Formication  Dizziness  Chest pressure    Rx / DC Orders ED Discharge Orders    None       Bonnita Hollow, MD 06/04/19 Baker, Clayton, DO 06/04/19 1335

## 2019-06-04 NOTE — ED Triage Notes (Signed)
Patient states she was standing and washing dishes 2 days ago and felt like something was crawling up her right leg. Patient states she has had pain and tingling since that day. Patient states she got up to go to the bathroom at 0400 and felt like the room was in a spin.  Patient states she started Topiramate 25 mg tabs for weight loss x 1 month.

## 2019-07-14 ENCOUNTER — Encounter: Payer: Self-pay | Admitting: Orthopaedic Surgery

## 2019-07-14 ENCOUNTER — Other Ambulatory Visit: Payer: Self-pay

## 2019-07-14 ENCOUNTER — Ambulatory Visit: Payer: Self-pay

## 2019-07-14 ENCOUNTER — Ambulatory Visit: Payer: Medicare Other | Admitting: Orthopaedic Surgery

## 2019-07-14 VITALS — BP 133/63 | HR 62 | Ht 60.0 in | Wt 220.0 lb

## 2019-07-14 DIAGNOSIS — M25561 Pain in right knee: Secondary | ICD-10-CM | POA: Diagnosis not present

## 2019-07-14 DIAGNOSIS — G8929 Other chronic pain: Secondary | ICD-10-CM

## 2019-07-14 DIAGNOSIS — M25562 Pain in left knee: Secondary | ICD-10-CM

## 2019-07-14 DIAGNOSIS — Z96651 Presence of right artificial knee joint: Secondary | ICD-10-CM

## 2019-07-14 NOTE — Progress Notes (Signed)
Office Visit Note   Patient: Julie Graves           Date of Birth: [birth year]           MRN: [id number] Visit Date: 07/14/2019              Requested by: Harrison Mons, [address] [place],  [place] [id number] PCP: Harrison Mons, PA   Assessment & Plan: Visit Diagnoses:  1. Chronic pain of both knees   2. Hx of total knee arthroplasty, right     Plan: We discussed the progressive osteoarthritis in her left knee.  BMI is at 42 we gave her target weight she needs to work on losing about 20 pounds.  At this point no injection is recommended.  If she has increased symptoms she can return.  X-ray images and results were reviewed in detail and pathophysiology discussed.  Follow-Up Instructions: Return in about 6 months (around 01/11/2020).   Orders:  Orders Placed This Encounter  Procedures  . XR Knee 1-2 Views Right  . XR Knee 1-2 Views Left   No orders of the defined types were placed in this encounter.     Procedures: No procedures performed   Clinical Data: No additional findings.   Subjective: Chief Complaint  Patient presents with  . Left Knee - Pain  . Right Knee - Pain    HPI 79 year old female returns for follow-up with pain in her left and right knee both.  Right total knee arthroplasty June 2015 with residual quad weakness which is improved since her last visit June 2018.  She can ambulate without the cane but takes it with her when she is ambulating in the community.  She has noticed some increased left knee pain and swelling gradually.  She feels like something is running along the back of her right leg which feels like water on the skin from the back to the knee halfway down in the calf she denies associated back pain no chills or fever.  No weakness in her calf or legs.  Review of Systems 14 point system update unchanged from 11/27/2016 other than as mentioned HPI.   Objective: Vital Signs: BP 133/63   Pulse 62   Ht 5' (1.524  m)   Wt 220 lb (99.8 kg)   BMI 42.97 kg/m   Physical Exam Constitutional:      Appearance: She is well-developed.  HENT:     Head: Normocephalic.     Right Ear: External ear normal.     Left Ear: External ear normal.  Eyes:     Pupils: Pupils are equal, round, and reactive to light.  Neck:     Thyroid: No thyromegaly.     Trachea: No tracheal deviation.  Cardiovascular:     Rate and Rhythm: Normal rate.  Pulmonary:     Effort: Pulmonary effort is normal.  Abdominal:     Palpations: Abdomen is soft.  Skin:    General: Skin is warm and dry.  Neurological:     Mental Status: She is alert and oriented to person, place, and time.  Psychiatric:        Behavior: Behavior normal.     Ortho Exam patient can ambulate with and without her cane in the office.  Right quad strength is significantly improved no extension lag.  Ankle range of motion is full anterior tib gastrocsoleus is normal no palpable Baker's cyst negative popliteal compression test.  Negative logroll to right  and left hip in sitting position.  Specialty Comments:  No specialty comments available.  Imaging: XR Knee 1-2 Views Left  Result Date: 07/14/2019 Standing AP both knees lateral left knee obtained and reviewed.  This shows left knee osteoarthritis with mild valgus more lateral than medial compartment arthritis.  Patellofemoral degenerative changes noted. Impression: Moderate left knee osteoarthritis with mild valgus.  XR Knee 1-2 Views Right  Result Date: 07/14/2019 Standing AP both knees lateral right knee obtained and reviewed.  This shows right cemented total knee arthroplasty.  Slight lucency underneath the cement medial and lateral tibial tray.  Healed as well cemented.  No evidence of femoral loosening.  Alignment looks good. Impression: Post right total knee arthroplasty now 5 years with trace lucent line underneath medial and lateral tibial tray without subsidence.    PMFS History: Patient Active  Problem List   Diagnosis Date Noted  . Hx of total knee arthroplasty, right 07/14/2019  . Precordial pain   . Angina pectoris ([place]) 08/13/2017  . Mixed dyslipidemia 08/13/2017  . CAD (coronary artery disease), native coronary artery 08/13/2017  . Mild episode of recurrent major depressive disorder ([place]) 11/03/2013  . Urge incontinence 04/02/2013  . Spinal stenosis, lumbar region, without neurogenic claudication 10/27/2012  . Spinal stenosis of lumbar region 10/01/2012  . Allergic rhinitis 09/26/2011  . Dizziness 01/23/2011  . Dyspnea 01/23/2011  . Type II diabetes mellitus ([place]) 06/27/2010  . Morbid obesity ([place]) 06/27/2010  . Impaired glucose tolerance 06/27/2010  . Disorder of bone and cartilage 06/22/2010  . Primary cardiomyopathy ([place]) 03/23/2010  . HYPERLIPIDEMIA-MIXED 03/07/2010  . Essential hypertension 03/07/2010  . CARDIOMEGALY 11/30/2009  . OTHER DYSPNEA AND RESPIRATORY ABNORMALITIES 11/30/2009  . AORTIC REGURGITATION 09/28/2009  . Chronic diastolic heart failure ([place]) 09/28/2009  . Aortic regurgitation 09/28/2009  . 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 Osteoarthrosis involving multiple sites 10/26/2008   Past Medical History:  Diagnosis Date  . Anginal pain ([place])    "small pain?nerves in chest"started 10/05/13   . Anginal pain ([place])    cleared by cardiology 3/15  . Anxiety    rx given recent not taken yet  . Aortic insufficiency    mild on 9/11 cath  . CAD (coronary artery disease)    nonobstructive let heart cath 3/11: 40% ostial D1, 30% mid CFX  . CHF (congestive heart failure) ([place])   . Colon polyps 02/01/2009    MULTIPLE FRAGMENTS OF TUBULAR ADENOMAS  . Diabetes mellitus    no meds  . Frozen shoulder   . GERD (gastroesophageal reflux disease)    occ  . History of blood transfusion    pregnancy  . HLD (hyperlipidemia)   . HTN (hypertension)    ACEI cough  . Hx of cardiovascular stress test    Lexiscan Myoview (10/2013):  Fixed ant defect most c/w  breast attenuation, cannot exclude apical infarct; no ischemia, EF 46%; Low Risk  . Nonischemic cardiomyopathy ([place])    Ech 3/11 difficult study, moderate aortic insuficiency noted. Left evntriculogram 3/11  . Obese   . Osteoarthritis   . Palpitation    PACs noted on telemetry while in the hospital  . Pneumonia    hx  . Stress incontinence     Family History  Problem Relation Age of Onset  . Heart attack Father   . Heart attack Sister   . Stroke Mother 15  . Diabetes Sister   . Heart disease Sister   . Diabetes Brother   . Colon cancer  Brother   . Breast cancer Neg Hx     Past Surgical History:  Procedure Laterality Date  . ABDOMINAL HYSTERECTOMY    . APPENDECTOMY    . BLADDER NECK SUSPENSION  85  . EYE SURGERY    . HAND SURGERY     right "had knots cut out"  . KNEE ARTHROPLASTY Right 12/09/2013   Procedure: COMPUTER ASSISTED Right TOTAL KNEE ARTHROPLASTY;  Surgeon: Marybelle Killings, MD;  Location: [facility];  Service: Orthopedics;  Laterality: Right;  . LEFT HEART CATH AND CORONARY ANGIOGRAPHY N/A 08/15/2017   Procedure: LEFT HEART CATH AND CORONARY ANGIOGRAPHY;  Surgeon: Jettie Booze, MD;  Location: [facility];  Service: Cardiovascular;  Laterality: N/A;  . TUBAL LIGATION     Social History   Occupational History  . Not on file  Tobacco Use  . Smoking status: Former Smoker    Packs/day: 1.00    Years: 15.00    Pack years: 15.00    Types: Cigarettes    Quit date: 10/10/1983    Years since quitting: 35.7  . Smokeless tobacco: Never Used  . Tobacco comment: Quit 1970s  Substance and Sexual Activity  . Alcohol use: No    Alcohol/week: 0.0 standard drinks  . Drug use: No  . Sexual activity: Not on file

## 2019-09-09 ENCOUNTER — Other Ambulatory Visit: Payer: Self-pay

## 2019-09-09 ENCOUNTER — Encounter: Payer: Self-pay | Admitting: Cardiology

## 2019-09-09 ENCOUNTER — Ambulatory Visit: Payer: Medicare Other | Admitting: Cardiology

## 2019-09-09 VITALS — BP 136/76 | HR 66 | Temp 96.0°F | Ht 60.0 in | Wt 230.0 lb

## 2019-09-09 DIAGNOSIS — I10 Essential (primary) hypertension: Secondary | ICD-10-CM

## 2019-09-09 DIAGNOSIS — I2510 Atherosclerotic heart disease of native coronary artery without angina pectoris: Secondary | ICD-10-CM

## 2019-09-09 DIAGNOSIS — I428 Other cardiomyopathies: Secondary | ICD-10-CM

## 2019-09-09 DIAGNOSIS — R7303 Prediabetes: Secondary | ICD-10-CM

## 2019-09-09 DIAGNOSIS — I5042 Chronic combined systolic (congestive) and diastolic (congestive) heart failure: Secondary | ICD-10-CM

## 2019-09-09 DIAGNOSIS — Z6841 Body Mass Index (BMI) 40.0 and over, adult: Secondary | ICD-10-CM

## 2019-09-09 MED ORDER — JARDIANCE 10 MG PO TABS: 10.0000 mg | ORAL_TABLET | Freq: Every day | ORAL | 6 refills | Status: DC

## 2019-09-09 MED ORDER — LOSARTAN POTASSIUM 100 MG PO TABS: 100.0000 mg | ORAL_TABLET | Freq: Every day | ORAL | 3 refills | Status: DC

## 2019-09-09 NOTE — Progress Notes (Signed)
Primary Physician/Referring:  Harrison Mons, PA  Patient ID: Julie Graves, female    DOB: 1940-07-10, 79 y.o.   MRN: [id number]  Chief Complaint  Patient presents with  . Congestive Heart Failure  . Hypertension  . Follow-up    6 month   HPI:    Julie Graves  is a 79 y.o. female  with  H/O morbid obesity, , nonischemic cardiomyopathy with EF 40% by echo in June 2019. Coronary angiogram on 08/21/17 and revealed mid circumflex 50% and small with 2 distal diagonal 80% stenosis. She has mild Chronic stable angina pectoris, mild to moderate aortic insufficiency, pre diabetes (Patient reluctant to starting metformin due to side-effects), hypertension, hyperlipidemia, former tobacco use with 15-pack-year history.  This is six-month office visit and follow-up of chronic diastolic heart failure, hypertension and CAD.  She is tolerating spironolactone.  No change in dyspnea, leg edema is stable.    Past Medical History:  Diagnosis Date  . Anginal pain ([place])    "small pain?nerves in chest"started 10/05/13   . Anginal pain ([place])    cleared by cardiology 3/15  . Anxiety    rx given recent not taken yet  . Aortic insufficiency    mild on 9/11 cath  . CAD (coronary artery disease)    nonobstructive let heart cath 3/11: 40% ostial D1, 30% mid CFX  . CHF (congestive heart failure) ([place])   . Colon polyps 02/01/2009    MULTIPLE FRAGMENTS OF TUBULAR ADENOMAS  . Diabetes mellitus    no meds  . Frozen shoulder   . GERD (gastroesophageal reflux disease)    occ  . History of blood transfusion    pregnancy  . HLD (hyperlipidemia)   . HTN (hypertension)    ACEI cough  . Hx of cardiovascular stress test    Lexiscan Myoview (10/2013):  Fixed ant defect most c/w breast attenuation, cannot exclude apical infarct; no ischemia, EF 46%; Low Risk  . Nonischemic cardiomyopathy ([place])    Ech 3/11 difficult study, moderate aortic insuficiency noted. Left evntriculogram 3/11  . Obese   .  Osteoarthritis   . Palpitation    PACs noted on telemetry while in the hospital  . Pneumonia    hx  . Stress incontinence    Past Surgical History:  Procedure Laterality Date  . ABDOMINAL HYSTERECTOMY    . APPENDECTOMY    . BLADDER NECK SUSPENSION  85  . EYE SURGERY    . HAND SURGERY     right "had knots cut out"  . KNEE ARTHROPLASTY Right 12/09/2013   Procedure: COMPUTER ASSISTED Right TOTAL KNEE ARTHROPLASTY;  Surgeon: Marybelle Killings, MD;  Location: [facility];  Service: Orthopedics;  Laterality: Right;  . LEFT HEART CATH AND CORONARY ANGIOGRAPHY N/A 08/15/2017   Procedure: LEFT HEART CATH AND CORONARY ANGIOGRAPHY;  Surgeon: Jettie Booze, MD;  Location: [facility];  Service: Cardiovascular;  Laterality: N/A;  . TUBAL LIGATION     Family History  Problem Relation Age of Onset  . Heart attack Father   . Heart attack Sister   . Stroke Mother 62  . Diabetes Sister   . Heart disease Sister   . Diabetes Brother   . Colon cancer Brother   . Breast cancer Neg Hx     Social History   Tobacco Use  . Smoking status: Former Smoker    Packs/day: 1.00    Years: 15.00    Pack years: 15.00    Types:  Cigarettes    Quit date: 10/10/1983    Years since quitting: 35.9  . Smokeless tobacco: Never Used  . Tobacco comment: Quit 1970s  Substance Use Topics  . Alcohol use: No    Alcohol/week: 0.0 standard drinks   ROS  Review of Systems  Cardiovascular: Positive for dyspnea on exertion and leg swelling. Negative for chest pain.  Musculoskeletal: Positive for joint pain.  Gastrointestinal: Negative for melena.   Objective  Blood pressure 136/76, pulse 66, temperature (!) 96 F (35.6 C), temperature source Temporal, height 5' (1.524 m), weight 230 lb (104.3 kg), SpO2 98 %.  Vitals with BMI 09/09/2019 09/09/2019 07/14/2019  Height 5\' 0"  5\' 0"  5\' 0"   Weight 230 lbs 230 lbs 8 oz 220 lbs  BMI 44.92 0000000 [id number]  Systolic [id number]  Diastolic 76 51 63  Pulse 66 65 62      Physical Exam  Constitutional:  Morbidly obese in no acute distress.  Eyes: Conjunctivae are normal.  Cardiovascular: Normal rate, regular rhythm and normal heart sounds. Exam reveals no gallop.  No murmur heard. Pulses:      Carotid pulses are 2+ on the right side and 2+ on the left side.      Dorsalis pedis pulses are 2+ on the right side and 2+ on the left side.       Posterior tibial pulses are 2+ on the right side and 2+ on the left side.  Femoral and popliteal pulse difficult to feel due to patient's body habitus.  1-2+ bilateral pitting below knee leg edema.  JVD difficult to see due to short neck.  Pulmonary/Chest: Effort normal and breath sounds normal.  Abdominal: Soft. Bowel sounds are normal.  Obese. Pannus present   Laboratory examination:   Recent Labs    09/23/18 1436 06/04/19 1006  NA 141 137  K 4.6 4.0  CL 99 104  CO2 25 25  GLUCOSE 101* 107*  BUN 20 25*  CREATININE 0.53* 0.67  CALCIUM 9.5 9.1  GFRNONAA 92 >60  GFRAA 106 >60   CrCl cannot be calculated (Patient's most recent lab result is older than the maximum 21 days allowed.).  CMP Latest Ref Rng & Units 06/04/2019 09/23/2018 08/15/2017  Glucose 70 - 99 mg/dL 107(H) 101(H) 151(H)  BUN 8 - 23 mg/dL 25(H) 20 21(H)  Creatinine 0.44 - 1.00 mg/dL 0.67 0.53(L) 0.66  Sodium 135 - 145 mmol/L 137 141 138  Potassium 3.5 - 5.1 mmol/L 4.0 4.6 4.4  Chloride 98 - 111 mmol/L 104 99 103  CO2 22 - 32 mmol/L 25 25 24   Calcium 8.9 - 10.3 mg/dL 9.1 9.5 9.5  Total Protein 6.0 - 8.3 g/dL - - -  Total Bilirubin 0.3 - 1.2 mg/dL - - -  Alkaline Phos 39 - 117 U/L - - -  AST 0 - 37 U/L - - -  ALT 0 - 35 U/L - - -   CBC Latest Ref Rng & Units 06/04/2019 08/13/2017 07/09/2014  WBC 4.0 - 10.5 K/uL 6.8 7.3 6.8  Hemoglobin 12.0 - 15.0 g/dL 13.3 13.1 12.7  Hematocrit 36.0 - 46.0 % 42.3 40.6 38.6  Platelets 150 - 400 K/uL 219 281 278   External labs:   Care Everywhere Result Report  CBC 04/29/2019: Hb 12.6, platelets 279,  normal indicis.  Care Everywhere Result Report Lipid Panel With LDL/HDL RatioResulted: 04/30/2019 6:36 AM [facility] Component Name Value Ref Range Cholesterol, Total 137 100 - 199 mg/dL Triglycerides 159 (H) 0 -  149 mg/dL HDL 58 >39 mg/dL VLDL Cholesterol Cal 27 5 - 40 mg/dL LDL 52 0 - 99 mg/dL  TSHResulted: 01/28/2019 4:35 AM A1c 04/29/2019: 5.8%.  [facility] Component Name Value Ref Range TSH 1.570 0.45 - 4.5 uIU/mL   Medications and allergies   Allergies  Allergen Reactions  . Topiramate     Other reaction(s): Chest Pain  . Shellfish Allergy Swelling and Other (See Comments)    Swelling - throat and lips  . Iodine Hives and Swelling  . Lisinopril Other (See Comments)    cough  . Metamucil [Psyllium]   . Tramadol Other (See Comments)    confusion  . Penicillins Hives, Rash and Other (See Comments)    Has patient had a PCN reaction causing immediate rash, facial/tongue/throat swelling, SOB or lightheadedness with hypotension: Unknown Has patient had a PCN reaction causing severe rash involving mucus membranes or skin necrosis: No Has patient had a PCN reaction that required hospitalization: No Has patient had a PCN reaction occurring within the last 10 years: No If all of the above answers are "NO", then may proceed with Cephalosporin use.      Current Outpatient Medications  Medication Instructions  . acetaminophen (TYLENOL) 1,000 mg, Oral, Every 6 hours PRN  . alendronate (FOSAMAX) 70 mg, Oral, Every Mon, Take with a full glass of water on an empty stomach. Take on mondays  . aspirin 81 mg, Oral, Daily  . Calcium Carb-Cholecalciferol (CALCIUM 600/VITAMIN D3 PO) 1 tablet, Oral, Daily  . carvedilol (COREG) 6.25 mg, 2 times daily  . EPINEPHrine (EPI-PEN) 0.3 mg, Intramuscular, As needed  . fluticasone (FLONASE) 50 MCG/ACT nasal spray 1 spray, Each Nare, Daily PRN  . furosemide (LASIX) 20 mg, Oral, Daily PRN  . Jardiance 10 mg, Oral, Daily before breakfast  .  losartan (COZAAR) 100 mg, Oral, Daily  . metFORMIN (GLUCOPHAGE-XR) 500 mg, Oral, Daily  . nitroGLYCERIN (NITROSTAT) 0.4 mg, Sublingual, Every 5 min PRN  . potassium chloride SA (K-DUR) 20 MEQ tablet 20 mEq, Oral, Daily PRN  . simvastatin (ZOCOR) 40 mg, Daily at bedtime  . spironolactone (ALDACTONE) 25 MG tablet TAKE 1 TABLET(25 MG) BY MOUTH DAILY  . Vitamin D 2,000 Units, Daily   Radiology:   No results found.  Cardiac Studies:   Stress nuclear study 11/05/2013:  Exercise Capacity:  Lexiscan with no exercise. BP Response:  Normal blood pressure response. Clinical Symptoms:  There is dyspnea. ECG Impression:  No significant ECG changes with Lexiscan. Comparison with Prior Nuclear Study: No images to compare to, however the clinical repeat appears to be similar. Overall Impression:  Low risk stress nuclear study With mild apical breast attenuation. Cannot exclude small apical infarct. LV Wall Motion:  Unable to adequately it says the overall cardiac function as the gated images were not adequately established to capture wall motion.  Echocardiogram 12/20/2017: Left ventricle cavity is normal in size. Moderate concentric hypertrophy of the left ventricle. Mild decrease in global wall motion. Visual EF is 40-45%. Doppler evidence of grade I (impaired) diastolic dysfunction, normal LAP. Calculated EF 42%. Left atrial cavity is moderately dilated. Mild aortic valve leaflet calcification. Mild aortic valve stenosis. Aortic valve mean gradient of 7 mmHg, Vmax of 1.8 m/s. Calculated aortic valve area by continuity equation is 1.6 cm. Mild to moderate aortic regurgitation. Mild (Grade I) mitral regurgitation. Mild tricuspid regurgitation. No evidence of pulmonary hypertension.  Coronary angiogram 07/2017: mid circumflex 50%, D1 ostial 50% and mid 80%, very small. Mild noncritical CAD  other vessels. EF 45%.  EKG  EKG 09/09/2019: Normal sinus rhythm with rate of 62 bpm, left axis deviation, left  anterior fascicular block.  Poor R progression, cannot exclude anteroseptal infarct old.  IVCD, LVH with strain.  Abnormal EKG. NO SIGNIFICANT CHANGE FROM EKG 03/09/2019.    Assessment     ICD-10-CM   1. Chronic combined systolic and diastolic heart failure (HCC)  I50.42 EKG 12-Lead    PCV ECHOCARDIOGRAM COMPLETE    empagliflozin (JARDIANCE) 10 MG TABS tablet    losartan (COZAAR) 100 MG tablet    Basic metabolic panel    Brain natriuretic peptide    DISCONTINUED: empagliflozin (JARDIANCE) 10 MG TABS tablet  2. Non-ischemic cardiomyopathy ([place])  I42.8   3. Coronary artery disease involving native coronary artery of native heart without angina pectoris  I25.10   4. Essential hypertension  I10 losartan (COZAAR) 100 MG tablet    Basic metabolic panel  5. Pre-diabetes  R73.03 empagliflozin (JARDIANCE) 10 MG TABS tablet    DISCONTINUED: empagliflozin (JARDIANCE) 10 MG TABS tablet  6. Class 3 severe obesity due to excess calories with serious comorbidity and body mass index (BMI) of 40.0 to 44.9 in adult [facility])  E66.01    Z68.41     Meds ordered this encounter  Medications  . DISCONTD: empagliflozin (JARDIANCE) 10 MG TABS tablet    Sig: Take 10 mg by mouth daily before breakfast.    Dispense:  30 tablet    Refill:  6  . empagliflozin (JARDIANCE) 10 MG TABS tablet    Sig: Take 10 mg by mouth daily before breakfast.    Dispense:  30 tablet    Refill:  6  . losartan (COZAAR) 100 MG tablet    Sig: Take 1 tablet (100 mg total) by mouth daily.    Dispense:  90 tablet    Refill:  3    Medications Discontinued During This Encounter  Medication Reason  . empagliflozin (JARDIANCE) 10 MG TABS tablet   . losartan (COZAAR) 50 MG tablet   . cetirizine (ZYRTEC) 10 MG tablet Patient has not taken in last 30 days  . HYDROcodone-acetaminophen (NORCO/VICODIN) 5-325 MG tablet Patient has not taken in last 30 days    Recommendations:   Julie Graves  is a 79 y.o. female  with  H/O morbid  obesity, , nonischemic cardiomyopathy with EF 40% by echo in June 2019. Coronary angiogram on 08/21/17 and revealed mid circumflex 50% and small with 2 distal diagonal 80% stenosis. She has mild Chronic stable angina pectoris, mild to moderate aortic insufficiency, pre diabetes (Patient reluctant to starting metformin due to side-effects), hypertension, hyperlipidemia, former tobacco use with 15-pack-year history.  Patient seen in 75-month visit, blood pressure initially was slightly elevated.  In view of prediabetes mellitus, heart failure, I started her on Jardiance 10 mg daily, which should help with fluid loss and also hopefully will control her blood pressure better.  Weight loss was discussed extensively.  I will repeat an echocardiogram to reevaluate her LV systolic function.  I will also increase losartan from 50 mg to 100 g daily, obtain BMP in 2 weeks and I will also obtain a BNP as a baseline.  With regard to lipids, mild elevation in triglycerides related to her diet.  Hopefully making changes to her diet and weight loss will help with better control otherwise LDL is at goal with regard to CAD and she remains angina free.  Unless significant abnormality, I will see her back  in 3 months.  Adrian Prows, MD, [facility] 09/09/2019, 11:24 AM [facility]. [place] Office: [phone]

## 2019-09-09 NOTE — Patient Instructions (Signed)
Please note I have increased losartan from 50 mg to 100 mg daily.  I wanted to get blood work done in 2 weeks from now, he can go to any [facility] to have the blood work.  We will be in touch with you.

## 2019-09-14 ENCOUNTER — Other Ambulatory Visit: Payer: Self-pay | Admitting: Cardiology

## 2019-09-14 DIAGNOSIS — I10 Essential (primary) hypertension: Secondary | ICD-10-CM

## 2019-09-14 DIAGNOSIS — I5032 Chronic diastolic (congestive) heart failure: Secondary | ICD-10-CM

## 2019-09-15 ENCOUNTER — Other Ambulatory Visit: Payer: Self-pay

## 2019-09-15 DIAGNOSIS — I10 Essential (primary) hypertension: Secondary | ICD-10-CM

## 2019-09-15 DIAGNOSIS — I5032 Chronic diastolic (congestive) heart failure: Secondary | ICD-10-CM

## 2019-09-15 MED ORDER — SPIRONOLACTONE 25 MG PO TABS: ORAL_TABLET | ORAL | 1 refills | Status: DC

## 2019-09-21 ENCOUNTER — Other Ambulatory Visit: Payer: Self-pay

## 2019-09-21 ENCOUNTER — Ambulatory Visit: Payer: Medicare Other

## 2019-09-21 DIAGNOSIS — I5042 Chronic combined systolic (congestive) and diastolic (congestive) heart failure: Secondary | ICD-10-CM

## 2019-09-30 LAB — BASIC METABOLIC PANEL
BUN/Creatinine Ratio: 24 (ref 12–28)
BUN: 17 mg/dL (ref 8–27)
CO2: 23 mmol/L (ref 20–29)
Calcium: 9.7 mg/dL (ref 8.7–10.3)
Chloride: 104 mmol/L (ref 96–106)
Creatinine, Ser: 0.72 mg/dL (ref 0.57–1.00)
GFR calc Af Amer: 93 mL/min/{1.73_m2} (ref >59)
GFR calc non Af Amer: 80 mL/min/{1.73_m2} (ref >59)
Glucose: 97 mg/dL (ref 65–99)
Potassium: 4.5 mmol/L (ref 3.5–5.2)
Sodium: 140 mmol/L (ref 134–144)

## 2019-09-30 LAB — BRAIN NATRIURETIC PEPTIDE: BNP: 113.9 pg/mL — ABNORMAL HIGH (ref 0.0–100.0)

## 2019-12-21 ENCOUNTER — Ambulatory Visit: Payer: Medicare Other | Admitting: Cardiology

## 2019-12-21 ENCOUNTER — Encounter: Payer: Self-pay | Admitting: Cardiology

## 2019-12-21 ENCOUNTER — Other Ambulatory Visit: Payer: Self-pay

## 2019-12-21 VITALS — BP 130/69 | HR 60 | Resp 16 | Ht 60.0 in | Wt 226.0 lb

## 2019-12-21 DIAGNOSIS — I2510 Atherosclerotic heart disease of native coronary artery without angina pectoris: Secondary | ICD-10-CM

## 2019-12-21 DIAGNOSIS — I428 Other cardiomyopathies: Secondary | ICD-10-CM

## 2019-12-21 DIAGNOSIS — I10 Essential (primary) hypertension: Secondary | ICD-10-CM

## 2019-12-21 DIAGNOSIS — I5042 Chronic combined systolic (congestive) and diastolic (congestive) heart failure: Secondary | ICD-10-CM

## 2019-12-21 NOTE — Progress Notes (Signed)
Primary Physician/Referring:  Harrison Mons, PA  Patient ID: Julie Graves, female    DOB: [birth year], 79 y.o.   MRN: [id number]  Chief Complaint  Patient presents with  . Hypertension  . Congestive Heart Failure  . Follow-up    3 month   HPI:    Julie Graves  is a 79 y.o. female  with  H/O morbid obesity, , nonischemic cardiomyopathy with EF 40% by echo in June 2019. Coronary angiogram on 08/21/17 and revealed mid circumflex 50% and small with 2 distal diagonal 80% stenosis. She has mild Chronic stable angina pectoris, mild to moderate aortic insufficiency, pre diabetes (Patient reluctant to starting metformin due to side-effects), hypertension, hyperlipidemia, former tobacco use with 15-pack-year history.  I have seen a few months ago, I started her on Jardiance for diabetes as she did not want to be on Metformin.  She is tolerating this.  She underwent echocardiogram and presents for follow-up.  No change in her chronic dyspnea, no leg edema, no PND or orthopnea.  She has not had any chest pain..    Past Medical History:  Diagnosis Date  . Anginal pain ([place])    "small pain?nerves in chest"started 10/05/13   . Anginal pain ([place])    cleared by cardiology 3/15  . Anxiety    rx given recent not taken yet  . Aortic insufficiency    mild on 9/11 cath  . CAD (coronary artery disease)    nonobstructive let heart cath 3/11: 40% ostial D1, 30% mid CFX  . CHF (congestive heart failure) ([place])   . Colon polyps 02/01/2009    MULTIPLE FRAGMENTS OF TUBULAR ADENOMAS  . Diabetes mellitus    no meds  . Frozen shoulder   . GERD (gastroesophageal reflux disease)    occ  . History of blood transfusion    pregnancy  . HLD (hyperlipidemia)   . HTN (hypertension)    ACEI cough  . Hx of cardiovascular stress test    Lexiscan Myoview (10/2013):  Fixed ant defect most c/w breast attenuation, cannot exclude apical infarct; no ischemia, EF 46%; Low Risk  . Nonischemic cardiomyopathy  ([place])    Ech 3/11 difficult study, moderate aortic insuficiency noted. Left evntriculogram 3/11  . Obese   . Osteoarthritis   . Palpitation    PACs noted on telemetry while in the hospital  . Pneumonia    hx  . Stress incontinence    Past Surgical History:  Procedure Laterality Date  . ABDOMINAL HYSTERECTOMY    . APPENDECTOMY    . BLADDER NECK SUSPENSION  85  . EYE SURGERY    . HAND SURGERY     right "had knots cut out"  . KNEE ARTHROPLASTY Right 12/09/2013   Procedure: COMPUTER ASSISTED Right TOTAL KNEE ARTHROPLASTY;  Surgeon: Marybelle Killings, MD;  Location: [facility];  Service: Orthopedics;  Laterality: Right;  . LEFT HEART CATH AND CORONARY ANGIOGRAPHY N/A 08/15/2017   Procedure: LEFT HEART CATH AND CORONARY ANGIOGRAPHY;  Surgeon: Jettie Booze, MD;  Location: [facility];  Service: Cardiovascular;  Laterality: N/A;  . TUBAL LIGATION     Family History  Problem Relation Age of Onset  . Heart attack Father   . Heart attack Sister   . Stroke Mother 62  . Diabetes Sister   . Heart disease Sister   . Diabetes Brother   . Colon cancer Brother   . Breast cancer Neg Hx     Social History  Tobacco Use  . Smoking status: Former Smoker    Packs/day: 1.00    Years: 15.00    Pack years: 15.00    Types: Cigarettes    Quit date: 10/10/1983    Years since quitting: 36.2  . Smokeless tobacco: Never Used  . Tobacco comment: Quit 1970s  Substance Use Topics  . Alcohol use: No    Alcohol/week: 0.0 standard drinks   ROS  Review of Systems  Cardiovascular: Positive for dyspnea on exertion and leg swelling. Negative for chest pain.  Musculoskeletal: Positive for joint pain.  Gastrointestinal: Negative for melena.   Objective  Blood pressure 130/69, pulse 60, resp. rate 16, height 5' (1.524 m), weight 226 lb (102.5 kg), SpO2 99 %.  Vitals with BMI 12/21/2019 09/09/2019 09/09/2019  Height 5' 0"  5' 0"  5' 0"   Weight 226 lbs 230 lbs 230 lbs 8 oz  BMI 44.14 35.57 32.20   Systolic 254 270 623  Diastolic 69 76 51  Pulse 60 66 65     Physical Exam Constitutional:      Comments: Morbidly obese in no acute distress.  Cardiovascular:     Rate and Rhythm: Normal rate and regular rhythm.     Pulses:          Carotid pulses are 2+ on the right side and 2+ on the left side.      Dorsalis pedis pulses are 2+ on the right side and 2+ on the left side.       Posterior tibial pulses are 2+ on the right side and 2+ on the left side.     Heart sounds: Normal heart sounds. No murmur heard.  No gallop.      Comments: Femoral and popliteal pulse difficult to feel due to patient's body habitus.  1+ bilateral pitting below knee leg edema.  JVD difficult to see due to short neck. Pulmonary:     Effort: Pulmonary effort is normal.     Breath sounds: Normal breath sounds.  Abdominal:     General: Bowel sounds are normal.     Palpations: Abdomen is soft.     Comments: Obese. Pannus present    Laboratory examination:   Recent Labs    06/04/19 1006 09/29/19 1305  NA 137 140  K 4.0 4.5  CL 104 104  CO2 25 23  GLUCOSE 107* 97  BUN 25* 17  CREATININE 0.67 0.72  CALCIUM 9.1 9.7  GFRNONAA >60 80  GFRAA >60 93   CrCl cannot be calculated (Patient's most recent lab result is older than the maximum 21 days allowed.).  CMP Latest Ref Rng & Units 09/29/2019 06/04/2019 09/23/2018  Glucose 65 - 99 mg/dL 97 107(H) 101(H)  BUN 8 - 27 mg/dL 17 25(H) 20  Creatinine 0.57 - 1.00 mg/dL 0.72 0.67 0.53(L)  Sodium 134 - 144 mmol/L 140 137 141  Potassium 3.5 - 5.2 mmol/L 4.5 4.0 4.6  Chloride 96 - 106 mmol/L 104 104 99  CO2 20 - 29 mmol/L 23 25 25   Calcium 8.7 - 10.3 mg/dL 9.7 9.1 9.5  Total Protein 6.0 - 8.3 g/dL - - -  Total Bilirubin 0.3 - 1.2 mg/dL - - -  Alkaline Phos 39 - 117 U/L - - -  AST 0 - 37 U/L - - -  ALT 0 - 35 U/L - - -   CBC Latest Ref Rng & Units 06/04/2019 08/13/2017 07/09/2014  WBC 4.0 - 10.5 K/uL 6.8 7.3 6.8  Hemoglobin 12.0 - 15.0  g/dL 13.3 13.1 12.7   Hematocrit 36 - 46 % 42.3 40.6 38.6  Platelets 150 - 400 K/uL 219 281 278   External labs:   11/25/2019: A1c 5.7%.  Hb 12.6/HCT 37.5, platelets 203.  Total cholesterol 135, triglycerides 131, HDL 60, LDL 52.  Serum glucose 90 mg, BUN 12, creatinine 0.59, EGFR greater than 60 mL, potassium 4.8, sodium 140, CMP otherwise normal.  Medications and allergies   Allergies  Allergen Reactions  . Topiramate     Other reaction(s): Chest Pain  . Shellfish Allergy Swelling and Other (See Comments)    Swelling - throat and lips  . Iodine Hives and Swelling  . Lisinopril Other (See Comments)    cough  . Metamucil [Psyllium]   . Tramadol Other (See Comments)    confusion  . Penicillins Hives, Rash and Other (See Comments)    Has patient had a PCN reaction causing immediate rash, facial/tongue/throat swelling, SOB or lightheadedness with hypotension: Unknown Has patient had a PCN reaction causing severe rash involving mucus membranes or skin necrosis: No Has patient had a PCN reaction that required hospitalization: No Has patient had a PCN reaction occurring within the last 10 years: No If all of the above answers are "NO", then may proceed with Cephalosporin use.      Current Outpatient Medications  Medication Instructions  . acetaminophen (TYLENOL) 1,000 mg, Oral, Every 6 hours PRN  . alendronate (FOSAMAX) 70 mg, Oral, Every Mon, Take with a full glass of water on an empty stomach. Take on mondays  . aspirin 81 mg, Oral, Daily  . Calcium Carb-Cholecalciferol (CALCIUM 600/VITAMIN D3 PO) 1 tablet, Oral, Daily  . carvedilol (COREG) 6.25 mg, 2 times daily  . diazepam (VALIUM) 2 mg, Oral, As needed  . EPINEPHrine (EPI-PEN) 0.3 mg, Intramuscular, As needed  . fluticasone (FLONASE) 50 MCG/ACT nasal spray 1 spray, Each Nare, Daily PRN  . furosemide (LASIX) 20 mg, Oral, Daily PRN  . Jardiance 10 mg, Oral, Daily before breakfast  . losartan (COZAAR) 100 mg, Oral, Daily  . metFORMIN  (GLUCOPHAGE-XR) 500 mg, Oral, Daily  . potassium chloride SA (K-DUR) 20 MEQ tablet 20 mEq, Oral, Daily PRN  . simvastatin (ZOCOR) 40 mg, Daily at bedtime  . spironolactone (ALDACTONE) 25 MG tablet TAKE 1 TABLET(25 MG) BY MOUTH DAILY  . Vitamin D 2,000 Units, Daily   Radiology:   No results found.  Cardiac Studies:   Stress nuclear study 11/05/2013:  Exercise Capacity:  Lexiscan with no exercise. BP Response:  Normal blood pressure response. Clinical Symptoms:  There is dyspnea. ECG Impression:  No significant ECG changes with Lexiscan. Comparison with Prior Nuclear Study: No images to compare to, however the clinical repeat appears to be similar. Overall Impression:  Low risk stress nuclear study With mild apical breast attenuation. Cannot exclude small apical infarct. LV Wall Motion:  Unable to adequately it says the overall cardiac function as the gated images were not adequately established to capture wall motion.  Echocardiogram 09/21/2019:  Left ventricle cavity is normal in size. Moderate concentric hypertrophy  of the left ventricle. Moderate global hypokinesis. LVEF 40-45%. Grade I  diastolic function. Normal left atrial pressure.   Left atrial cavity is severely dilated. LA Vol index 51 ml/m2.  Trileaflet aortic valve with mild aortic valve leaflet calcification.  Moderate (Grade II) aortic regurgitation.  Moderate (Grade II) mitral regurgitation.  Moderate tricuspid regurgitation. Estimated pulmonary artery systolic  pressure is 20 mmHg.  Mild pulmonic regurgitation. No significant change  from 12/20/2017.  Coronary angiogram 07/2017: mid circumflex 50%, D1 ostial 50% and mid 80%, very small. Mild noncritical CAD other vessels. EF 45%.  EKG  EKG 09/09/2019: Normal sinus rhythm with rate of 62 bpm, left axis deviation, left anterior fascicular block.  Poor R progression, cannot exclude anteroseptal infarct old.  IVCD, LVH with strain.  Abnormal EKG. NO SIGNIFICANT CHANGE FROM  EKG 03/09/2019.    Assessment     ICD-10-CM   1. Chronic combined systolic and diastolic heart failure (HCC)  I50.42   2. Non-ischemic cardiomyopathy ([place])  I42.8   3. Coronary artery disease involving native coronary artery of native heart without angina pectoris  I25.10   4. Essential hypertension  I10     No orders of the defined types were placed in this encounter.   There are no discontinued medications.  Recommendations:   Julie Graves  is a 79 y.o. female  with  H/O morbid obesity, , nonischemic cardiomyopathy with EF 40% by echo in June 2019. Coronary angiogram on 08/21/17 and revealed mid circumflex 50% and small with 2 distal diagonal 80% stenosis. She has mild Chronic stable angina pectoris, mild to moderate aortic insufficiency, pre diabetes (Patient reluctant to starting metformin due to side-effects), hypertension, hyperlipidemia, former tobacco use with 15-pack-year history.  I had seen her on 09/09/2019, advised her to start Jardiance for diabetes mellitus as she was reluctant to taking Metformin.  She is tolerating this well.  There is no clinical evidence of heart failure, I reviewed her echocardiogram, fortunately LVEF is remained stable.  Today the blood pressure is also very well controlled.  She is now tolerating increased dose of losartan.  With regard to lipids were well controlled, I reviewed her external labs, diabetes is also not well controlled.  I will see her back in 6 months for follow-up.  With regard to coronary artery disease, she has not had any recurrence of angina pectoris.  Adrian Prows, MD, [facility] 12/21/2019, 11:40 AM [facility]. [place] Office: [phone]

## 2020-01-12 ENCOUNTER — Ambulatory Visit: Payer: Medicare Other | Admitting: Orthopaedic Surgery

## 2020-01-12 ENCOUNTER — Encounter: Payer: Self-pay | Admitting: Orthopaedic Surgery

## 2020-01-12 VITALS — Ht 60.0 in | Wt 220.0 lb

## 2020-01-12 DIAGNOSIS — M1712 Unilateral primary osteoarthritis, left knee: Secondary | ICD-10-CM | POA: Diagnosis not present

## 2020-01-12 DIAGNOSIS — G8929 Other chronic pain: Secondary | ICD-10-CM

## 2020-01-12 DIAGNOSIS — Z96651 Presence of right artificial knee joint: Secondary | ICD-10-CM

## 2020-01-12 DIAGNOSIS — M25561 Pain in right knee: Secondary | ICD-10-CM | POA: Diagnosis not present

## 2020-01-12 DIAGNOSIS — M25562 Pain in left knee: Secondary | ICD-10-CM | POA: Diagnosis not present

## 2020-01-12 MED ORDER — LIDOCAINE HCL 1 % IJ SOLN
3.0000 mL | INTRAMUSCULAR | Status: AC | PRN
  Administered 2020-01-12: 3 mL

## 2020-01-12 MED ORDER — METHYLPREDNISOLONE ACETATE 40 MG/ML IJ SUSP
40.0000 mg | INTRAMUSCULAR | Status: AC | PRN
  Administered 2020-01-12: 40 mg via INTRA_ARTICULAR

## 2020-01-12 MED ORDER — BUPIVACAINE HCL 0.25 % IJ SOLN
6.0000 mL | INTRAMUSCULAR | Status: AC | PRN
  Administered 2020-01-12: 6 mL via INTRA_ARTICULAR

## 2020-01-12 NOTE — Progress Notes (Signed)
Office Visit Note   Patient: Julie Graves           Date of Birth: August 08, 1940           MRN: [id number] Visit Date: 01/12/2020              Requested by: Harrison Mons, [address],  [place] [id number] PCP: Harrison Mons, PA   Assessment & Plan: Visit Diagnoses:  1. Hx of total knee arthroplasty, right   2. Chronic pain of both knees   3. Unilateral primary osteoarthritis, left knee     Plan: Fingerstick glucose in clinic today 97.  In hopes of giving patient some improvement of her left knee pain offered injection.  At patient consent left knee is prepped Betadine and intra-articular Marcaine/Depo-Medrol performed.  Patient did have good improvement with anesthetic in place.  Ultimately may come down the patient needing total knee replacement but hoping to be able to put this off as long as possible if we can do this with conservative management.  Patient says that she has not had viscosupplementation to the left knee.  Follow-up in 3 months for recheck.  Follow-Up Instructions: Return in about 6 months (around 07/14/2020) for With Dr. Lorin Mercy.   Orders:  Orders Placed This Encounter  Procedures  . Large Joint Inj   No orders of the defined types were placed in this encounter.     Procedures: Large Joint Inj: L knee on 01/12/2020 11:27 AM Indications: pain Details: 25 G 1.5 in needle, medial approach Medications: 3 mL lidocaine 1 %; 6 mL bupivacaine 0.25 %; 40 mg methylPREDNISolone acetate 40 MG/ML Outcome: tolerated well, no immediate complications Consent was given by the patient. Patient was prepped and draped in the usual sterile fashion.       Clinical Data: No additional findings.   Subjective: Chief Complaint  Patient presents with  . Left Knee - Pain  . Right Knee - Pain    HPI 79 year old black female comes in today for recheck of her bilateral knee pain.  As previously documented documented she is status post right  total knee replacement June 2015.  Patient also has known history of left knee DJD.  States that both knees are about the same.  Most Monday states that she had a fall when she may have lost her footing going down the escalator.  She was able to ambulate after this incident.  Knee pain has not been worse since then.  States it has been a while since she is had her left knee injected.  Patient states that she has a history of borderline diabetes and is currently on oral meds for this. Review of Systems No current cardiopulmonary GI GU issues  Objective: Vital Signs: Ht 5' (1.524 m)   Wt 220 lb (99.8 kg)   BMI 42.97 kg/m   Physical Exam HENT:     Head: Normocephalic.  Pulmonary:     Effort: Pulmonary effort is normal. No respiratory distress.  Musculoskeletal:     Comments: Gait is antalgic.  Left knee positive patellofemoral crepitus.  Medial greater than lateral joint line tenderness.  Neurological:     Mental Status: She is alert and oriented to person, place, and time.  Psychiatric:        Mood and Affect: Mood normal.     Ortho Exam  Specialty Comments:  No specialty comments available.  Imaging: No results found.   PMFS History: Patient Active Problem  List   Diagnosis Date Noted  . Hx of total knee arthroplasty, right 07/14/2019  . Precordial pain   . Angina pectoris ([place]) 08/13/2017  . Mixed dyslipidemia 08/13/2017  . CAD (coronary artery disease), native coronary artery 08/13/2017  . Mild episode of recurrent major depressive disorder ([place]) 11/03/2013  . Urge incontinence 04/02/2013  . Spinal stenosis, lumbar region, without neurogenic claudication 10/27/2012  . Spinal stenosis of lumbar region 10/01/2012  . Allergic rhinitis 09/26/2011  . Dizziness 01/23/2011  . Dyspnea 01/23/2011  . Type II diabetes mellitus ([place]) 06/27/2010  . Morbid obesity ([place]) 06/27/2010  . Impaired glucose tolerance 06/27/2010  . Disorder of bone and cartilage 06/22/2010  . Primary  cardiomyopathy ([place]) 03/23/2010  . HYPERLIPIDEMIA-MIXED 03/07/2010  . Essential hypertension 03/07/2010  . CARDIOMEGALY 11/30/2009  . OTHER DYSPNEA AND RESPIRATORY ABNORMALITIES 11/30/2009  . AORTIC REGURGITATION 09/28/2009  . Chronic diastolic heart failure ([place]) 09/28/2009  . Aortic regurgitation 09/28/2009  . 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 Osteoarthrosis involving multiple sites 10/26/2008   Past Medical History:  Diagnosis Date  . Anginal pain ([place])    "small pain?nerves in chest"started 10/05/13   . Anginal pain ([place])    cleared by cardiology 3/15  . Anxiety    rx given recent not taken yet  . Aortic insufficiency    mild on 9/11 cath  . CAD (coronary artery disease)    nonobstructive let heart cath 3/11: 40% ostial D1, 30% mid CFX  . CHF (congestive heart failure) ([place])   . Colon polyps 02/01/2009    MULTIPLE FRAGMENTS OF TUBULAR ADENOMAS  . Diabetes mellitus    no meds  . Frozen shoulder   . GERD (gastroesophageal reflux disease)    occ  . History of blood transfusion    pregnancy  . HLD (hyperlipidemia)   . HTN (hypertension)    ACEI cough  . Hx of cardiovascular stress test    Lexiscan Myoview (10/2013):  Fixed ant defect most c/w breast attenuation, cannot exclude apical infarct; no ischemia, EF 46%; Low Risk  . Nonischemic cardiomyopathy ([place])    Ech 3/11 difficult study, moderate aortic insuficiency noted. Left evntriculogram 3/11  . Obese   . Osteoarthritis   . Palpitation    PACs noted on telemetry while in the hospital  . Pneumonia    hx  . Stress incontinence     Family History  Problem Relation Age of Onset  . Heart attack Father   . Heart attack Sister   . Stroke Mother 4  . Diabetes Sister   . Heart disease Sister   . Diabetes Brother   . Colon cancer Brother   . Breast cancer Neg Hx     Past Surgical History:  Procedure Laterality Date  . ABDOMINAL HYSTERECTOMY    . APPENDECTOMY    . BLADDER NECK SUSPENSION  85  . EYE SURGERY     . HAND SURGERY     right "had knots cut out"  . KNEE ARTHROPLASTY Right 12/09/2013   Procedure: COMPUTER ASSISTED Right TOTAL KNEE ARTHROPLASTY;  Surgeon: Marybelle Killings, MD;  Location: [place];  Service: Orthopedics;  Laterality: Right;  . LEFT HEART CATH AND CORONARY ANGIOGRAPHY N/A 08/15/2017   Procedure: LEFT HEART CATH AND CORONARY ANGIOGRAPHY;  Surgeon: Jettie Booze, MD;  Location: [facility];  Service: Cardiovascular;  Laterality: N/A;  . TUBAL LIGATION     Social History   Occupational History  . Not on file  Tobacco Use  .  Smoking status: Former Smoker    Packs/day: 1.00    Years: 15.00    Pack years: 15.00    Types: Cigarettes    Quit date: 10/10/1983    Years since quitting: 36.2  . Smokeless tobacco: Never Used  . Tobacco comment: Quit 1970s  Vaping Use  . Vaping Use: Never used  Substance and Sexual Activity  . Alcohol use: No    Alcohol/week: 0.0 standard drinks  . Drug use: No  . Sexual activity: Not on file

## 2020-03-01 ENCOUNTER — Other Ambulatory Visit: Payer: Self-pay | Admitting: Cardiology

## 2020-03-01 DIAGNOSIS — I5032 Chronic diastolic (congestive) heart failure: Secondary | ICD-10-CM

## 2020-03-01 DIAGNOSIS — I10 Essential (primary) hypertension: Secondary | ICD-10-CM

## 2020-03-30 ENCOUNTER — Ambulatory Visit: Payer: Medicare Other | Admitting: Orthopaedic Surgery

## 2020-03-30 ENCOUNTER — Encounter: Payer: Self-pay | Admitting: Orthopaedic Surgery

## 2020-03-30 DIAGNOSIS — Z96651 Presence of right artificial knee joint: Secondary | ICD-10-CM | POA: Diagnosis not present

## 2020-03-30 DIAGNOSIS — M1712 Unilateral primary osteoarthritis, left knee: Secondary | ICD-10-CM

## 2020-03-30 NOTE — Progress Notes (Signed)
Office Visit Note   Patient: Julie Graves           Date of Birth: [birth year]           MRN: [id number] Visit Date: 03/30/2020              Requested by: Harrison Mons, [address] [place],  [place] [id number] PCP: Harrison Mons, PA   Assessment & Plan: Visit Diagnoses:  1. Unilateral primary osteoarthritis, left knee   2. History of total knee arthroplasty, right     Plan: Patient has been using Tylenol hydrocodone intermittent ice topical cream use of a cane and is still at fall risk.  She needs to be using her walker.  She can go up steps having pain at night and has bone-on-bone changes in her left knee.  Has bone-on-bone changes lateral compartment and 10 degrees valgus of her knee with tricompartmental spurring.  We discussed she has failed intra-articular injections anti-inflammatories.  She has history of heart failure and is not able to tolerate anti-inflammatory oral medication.  Right total knee arthroplasty doing well.  She is ready to proceed with left total knee arthroplasty.  Decision for surgery made today.  Follow-Up Instructions: Return in about 4 weeks (around 04/27/2020).   Orders:  No orders of the defined types were placed in this encounter.  No orders of the defined types were placed in this encounter.     Procedures: No procedures performed   Clinical Data: No additional findings.   Subjective: Chief Complaint  Patient presents with  . Left Knee - Follow-up    HPI 79 year old female seen with severe left knee osteoarthritis now using a cane putting both hands on a cane to walk.  Previous right total knee 2015 doing well.  Pain is gotten worse she states her husband has some type of: Problem and is going to do that and likely will need some surgery on this.  She has been trying to put off left total knee arthroplasty until her husband is better but he has told her that she needs to go ahead and get it fixed.  She has  difficulty walking pain bothers her at night.  She currently cannot walk without her cane.  She does have a walker at home but has not gotten it out and used it.  Review of Systems she does have a history of hypertension hyperlipidemia history of heart failure has cardiologist.   Objective: Vital Signs: BP (!) 132/92 (BP Location: Left Arm, Patient Position: Sitting, Cuff Size: Large)   Pulse 74   Ht 5' (1.524 m)   Wt 220 lb (99.8 kg)   BMI 42.97 kg/m   Physical Exam Constitutional:      Appearance: She is well-developed.  HENT:     Head: Normocephalic.     Right Ear: External ear normal.     Left Ear: External ear normal.  Eyes:     Pupils: Pupils are equal, round, and reactive to light.  Neck:     Thyroid: No thyromegaly.     Trachea: No tracheal deviation.  Cardiovascular:     Rate and Rhythm: Normal rate.  Pulmonary:     Effort: Pulmonary effort is normal.  Abdominal:     Palpations: Abdomen is soft.  Skin:    General: Skin is warm and dry.  Neurological:     Mental Status: She is alert and oriented to person, place, and time.  Psychiatric:  Behavior: Behavior normal.     Ortho Exam patient has tenderness with palpation about her left knee.  Severe crepitus with knee range of motion she comes within 5 degrees of full extension flexes 100 degrees.  Pedal pulses are palpable negative logroll to the hips.  Opposite right total knee arthroplasty incision is nicely healed. Specialty Comments:  No specialty comments available.  Imaging: No results found.   PMFS History: Patient Active Problem List   Diagnosis Date Noted  . Unilateral primary osteoarthritis, left knee 03/31/2020  . History of total knee arthroplasty, right 03/31/2020  . Precordial pain   . Angina pectoris ([place]) 08/13/2017  . Mixed dyslipidemia 08/13/2017  . CAD (coronary artery disease), native coronary artery 08/13/2017  . Mild episode of recurrent major depressive disorder ([place])  11/03/2013  . Urge incontinence 04/02/2013  . Spinal stenosis, lumbar region, without neurogenic claudication 10/27/2012  . Spinal stenosis of lumbar region 10/01/2012  . Allergic rhinitis 09/26/2011  . Dizziness 01/23/2011  . Dyspnea 01/23/2011  . Type II diabetes mellitus ([place]) 06/27/2010  . Morbid obesity ([place]) 06/27/2010  . Impaired glucose tolerance 06/27/2010  . Disorder of bone and cartilage 06/22/2010  . Primary cardiomyopathy ([place]) 03/23/2010  . HYPERLIPIDEMIA-MIXED 03/07/2010  . Essential hypertension 03/07/2010  . CARDIOMEGALY 11/30/2009  . OTHER DYSPNEA AND RESPIRATORY ABNORMALITIES 11/30/2009  . AORTIC REGURGITATION 09/28/2009  . Chronic diastolic heart failure ([place]) 09/28/2009  . Aortic regurgitation 09/28/2009  . 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 Osteoarthrosis involving multiple sites 10/26/2008   Past Medical History:  Diagnosis Date  . Anginal pain ([place])    "small pain?nerves in chest"started 10/05/13   . Anginal pain ([place])    cleared by cardiology 3/15  . Anxiety    rx given recent not taken yet  . Aortic insufficiency    mild on 9/11 cath  . CAD (coronary artery disease)    nonobstructive let heart cath 3/11: 40% ostial D1, 30% mid CFX  . CHF (congestive heart failure) ([place])   . Colon polyps 02/01/2009    MULTIPLE FRAGMENTS OF TUBULAR ADENOMAS  . Diabetes mellitus    no meds  . Frozen shoulder   . GERD (gastroesophageal reflux disease)    occ  . History of blood transfusion    pregnancy  . HLD (hyperlipidemia)   . HTN (hypertension)    ACEI cough  . Hx of cardiovascular stress test    Lexiscan Myoview (10/2013):  Fixed ant defect most c/w breast attenuation, cannot exclude apical infarct; no ischemia, EF 46%; Low Risk  . Nonischemic cardiomyopathy ([place])    Ech 3/11 difficult study, moderate aortic insuficiency noted. Left evntriculogram 3/11  . Obese   . Osteoarthritis   . Palpitation    PACs noted on telemetry while in the hospital  .  Pneumonia    hx  . Stress incontinence     Family History  Problem Relation Age of Onset  . Heart attack Father   . Heart attack Sister   . Stroke Mother 38  . Diabetes Sister   . Heart disease Sister   . Diabetes Brother   . Colon cancer Brother   . Breast cancer Neg Hx     Past Surgical History:  Procedure Laterality Date  . ABDOMINAL HYSTERECTOMY    . APPENDECTOMY    . BLADDER NECK SUSPENSION  85  . EYE SURGERY    . HAND SURGERY     right "had knots cut out"  . KNEE ARTHROPLASTY Right 12/09/2013  Procedure: COMPUTER ASSISTED Right TOTAL KNEE ARTHROPLASTY;  Surgeon: Marybelle Killings, MD;  Location: [facility];  Service: Orthopedics;  Laterality: Right;  . LEFT HEART CATH AND CORONARY ANGIOGRAPHY N/A 08/15/2017   Procedure: LEFT HEART CATH AND CORONARY ANGIOGRAPHY;  Surgeon: Jettie Booze, MD;  Location: [facility];  Service: Cardiovascular;  Laterality: N/A;  . TUBAL LIGATION     Social History   Occupational History  . Not on file  Tobacco Use  . Smoking status: Former Smoker    Packs/day: 1.00    Years: 15.00    Pack years: 15.00    Types: Cigarettes    Quit date: 10/10/1983    Years since quitting: 36.4  . Smokeless tobacco: Never Used  . Tobacco comment: Quit 1970s  Vaping Use  . Vaping Use: Never used  Substance and Sexual Activity  . Alcohol use: No    Alcohol/week: 0.0 standard drinks  . Drug use: No  . Sexual activity: Not on file

## 2020-03-31 DIAGNOSIS — M1712 Unilateral primary osteoarthritis, left knee: Secondary | ICD-10-CM | POA: Insufficient documentation

## 2020-03-31 DIAGNOSIS — Z96651 Presence of right artificial knee joint: Secondary | ICD-10-CM | POA: Insufficient documentation

## 2020-04-05 ENCOUNTER — Ambulatory Visit: Payer: Medicare Other | Admitting: Orthopaedic Surgery

## 2020-04-11 ENCOUNTER — Encounter: Payer: Self-pay | Admitting: Cardiology

## 2020-04-13 ENCOUNTER — Ambulatory Visit: Payer: Medicare Other | Admitting: Orthopaedic Surgery

## 2020-04-20 ENCOUNTER — Other Ambulatory Visit: Payer: Self-pay

## 2020-04-30 ENCOUNTER — Other Ambulatory Visit: Payer: Self-pay | Admitting: Cardiology

## 2020-04-30 DIAGNOSIS — I5042 Chronic combined systolic (congestive) and diastolic (congestive) heart failure: Secondary | ICD-10-CM

## 2020-04-30 DIAGNOSIS — R7303 Prediabetes: Secondary | ICD-10-CM

## 2020-05-02 ENCOUNTER — Other Ambulatory Visit: Payer: Self-pay | Admitting: Cardiology

## 2020-05-02 DIAGNOSIS — R7303 Prediabetes: Secondary | ICD-10-CM

## 2020-05-02 DIAGNOSIS — I5042 Chronic combined systolic (congestive) and diastolic (congestive) heart failure: Secondary | ICD-10-CM

## 2020-05-04 ENCOUNTER — Ambulatory Visit (INDEPENDENT_AMBULATORY_CARE_PROVIDER_SITE_OTHER): Payer: Medicare Other | Admitting: Surgery

## 2020-05-04 ENCOUNTER — Other Ambulatory Visit: Payer: Self-pay

## 2020-05-04 ENCOUNTER — Encounter: Payer: Self-pay | Admitting: Surgery

## 2020-05-04 VITALS — BP 126/73 | HR 71 | Ht 59.5 in | Wt 220.2 lb

## 2020-05-04 DIAGNOSIS — M1712 Unilateral primary osteoarthritis, left knee: Secondary | ICD-10-CM

## 2020-05-06 NOTE — Progress Notes (Signed)
79 year old female history of end-stage DJD left knee and chronic pain comes in for preop evaluation.  States that knee symptoms unchanged in previous visit.  She is want to proceed with left total knee replacement as scheduled.  We have received preop cardiac clearance.  Today history and physical performed.  Review of systems negative.  Patient had right total knee replacement done several years ago and she did go to a skilled nurse facility briefly for rehab.  After long talk with patient we discussed that with her medical history this may be a good option for her after this surgery as well.  I did have a phone conversation with patient's daughter today regarding this.  Patient may not have a lot of help immediately for the first couple weeks at home postop.  Advised patient started we will see how Ms. Bi does have her surgery and also get recommendations from the physical therapist.  All questions answered.

## 2020-05-06 NOTE — Progress Notes (Signed)
Your procedure is scheduled on Friday 05/13/20.  Report to [facility] Main Entrance "A" at 08:00 A.M., and check in at the Admitting office.  Call this number if you have problems the morning of surgery: [phone]  Call [phone] if you have any questions prior to your surgery date Monday-Friday 8am-4pm   Remember: Do not eat after midnight the night before your surgery  You may drink clear liquids until 07:00 A.M. the morning of your surgery.   Clear liquids allowed are: Water, Non-Citrus Juices (without pulp), Carbonated Beverages, Clear Tea, Black Coffee Only, and Gatorade  Please complete your PRE-SURGERY water that was provided to you by 07:00 A.M. the morning of your surgery..  Please, if able, drink it in one setting. DO NOT SIP.    Take these medicines the morning of surgery with A SIP OF WATER: alendronate (FOSAMAX)  carvedilol (COREG)  diazepam (VALIUM)  If needed: acetaminophen (TYLENOL)  nitroGLYCERIN (NITROSTAT)    Follow your surgeon's instructions on when to stop Aspirin.  If no instructions were given by your surgeon then you will need to call the office to get those instructions.    As of today, STOP taking any Aleve, Naproxen, Ibuprofen, Motrin, Advil, Goody's, BC's, all herbal medications, fish oil, and all vitamins.   WHAT DO I DO ABOUT MY DIABETES MEDICATION?  JARDIANCE  Day before surgery - NONE Morning of surgery - NONE  metFORMIN (GLUCOPHAGE-XR)  Day before surgery - take as usual Morning of surgery - NONE  HOW TO MANAGE YOUR DIABETES BEFORE AND AFTER SURGERY  Why is it important to control my blood sugar before and after surgery? . Improving blood sugar levels before and after surgery helps healing and can limit problems. . A way of improving blood sugar control is eating a healthy diet by: o  Eating less sugar and carbohydrates o  Increasing activity/exercise o  Talking with your doctor about reaching your blood sugar goals . High  blood sugars (greater than 180 mg/dL) can raise your risk of infections and slow your recovery, so you will need to focus on controlling your diabetes during the weeks before surgery. . Make sure that the doctor who takes care of your diabetes knows about your planned surgery including the date and location.  How do I manage my blood sugar before surgery? . Check your blood sugar at least 4 times a day, starting 2 days before surgery, to make sure that the level is not too high or low. . Check your blood sugar the morning of your surgery when you wake up and every 2 hours until you get to the Short Stay unit. o If your blood sugar is less than 70 mg/dL, you will need to treat for low blood sugar: - Do not take insulin. - Treat a low blood sugar (less than 70 mg/dL) with  cup of clear juice (cranberry or apple), 4 glucose tablets, OR glucose gel. - Recheck blood sugar in 15 minutes after treatment (to make sure it is greater than 70 mg/dL). If your blood sugar is not greater than 70 mg/dL on recheck, call [phone] for further instructions. . Report your blood sugar to the short stay nurse when you get to Short Stay.  . If you are admitted to the hospital after surgery: o Your blood sugar will be checked by the staff and you will probably be given insulin after surgery (instead of oral diabetes medicines) to make sure you have good blood sugar levels. o  The goal for blood sugar control after surgery is 80-180 mg/dL.     The Morning of Surgery  Do not wear jewelry, make-up or nail polish.  Do not wear lotions, powders, or perfumes, or deodorant  Do not shave 48 hours prior to surgery.    Do not bring valuables to the hospital.  [facility] is not responsible for any belongings or valuables.  If you are a smoker, DO NOT Smoke 24 hours prior to surgery  If you wear a CPAP at night please bring your mask the morning of surgery   Remember that you must have someone to transport you home  after your surgery, and remain with you for 24 hours if you are discharged the same day.   Please bring cases for contacts, glasses, hearing aids, dentures or bridgework because it cannot be worn into surgery.    Leave your suitcase in the car.  After surgery it may be brought to your room.  For patients admitted to the hospital, discharge time will be determined by your treatment team.  Patients discharged the day of surgery will not be allowed to drive home.    Special instructions:   [facility]- Preparing For Surgery  Before surgery, you can play an important role. Because skin is not sterile, your skin needs to be as free of germs as possible. You can reduce the number of germs on your skin by washing with CHG (chlorahexidine gluconate) Soap before surgery.  CHG is an antiseptic cleaner which kills germs and bonds with the skin to continue killing germs even after washing.    Oral Hygiene is also important to reduce your risk of infection.  Remember - BRUSH YOUR TEETH THE MORNING OF SURGERY WITH YOUR REGULAR TOOTHPASTE  Please do not use if you have an allergy to CHG or antibacterial soaps. If your skin becomes reddened/irritated stop using the CHG.  Do not shave (including legs and underarms) for at least 48 hours prior to first CHG shower. It is OK to shave your face.  Please follow these instructions carefully.   1. Shower the NIGHT BEFORE SURGERY and the MORNING OF SURGERY with CHG Soap.   2. If you chose to wash your hair and body, wash as usual with your normal shampoo and body-wash/soap.  3. Rinse your hair and body thoroughly to remove the shampoo and soap.  4. Apply CHG directly to the skin (ONLY FROM THE NECK DOWN) and wash gently with a scrungie or a clean washcloth.   5. Do not use on open wounds or open sores. Avoid contact with your eyes, ears, mouth and genitals (private parts). Wash Face and genitals (private parts)  with your normal soap.   6. Wash thoroughly,  paying special attention to the area where your surgery will be performed.  7. Thoroughly rinse your body with warm water from the neck down.  8. DO NOT shower/wash with your normal soap after using and rinsing off the CHG Soap.  9. Pat yourself dry with a CLEAN TOWEL.  10. Wear CLEAN PAJAMAS to bed the night before surgery  11. Place CLEAN SHEETS on your bed the night of your first shower and DO NOT SLEEP WITH PETS.  12. Wear comfortable clothes the morning of surgery.     Day of Surgery:  Please shower the morning of surgery with the CHG soap Do not apply any deodorants/lotions. Please wear clean clothes to the hospital/surgery center.   Remember to brush your teeth  WITH YOUR REGULAR TOOTHPASTE.   Please read over the following fact sheets that you were given.

## 2020-05-09 ENCOUNTER — Encounter (HOSPITAL_COMMUNITY)
Admission: RE | Admit: 2020-05-09 | Discharge: 2020-05-09 | Disposition: A | Discharge status: Home / Self Care | Payer: Medicare Other | Source: Ambulatory Visit | Attending: Orthopaedic Surgery | Admitting: Orthopaedic Surgery

## 2020-05-09 ENCOUNTER — Other Ambulatory Visit: Payer: Self-pay

## 2020-05-09 ENCOUNTER — Encounter (HOSPITAL_COMMUNITY): Payer: Self-pay

## 2020-05-09 DIAGNOSIS — Z01818 Encounter for other preprocedural examination: Secondary | ICD-10-CM | POA: Diagnosis not present

## 2020-05-09 DIAGNOSIS — M1712 Unilateral primary osteoarthritis, left knee: Secondary | ICD-10-CM | POA: Insufficient documentation

## 2020-05-09 LAB — COMPREHENSIVE METABOLIC PANEL
ALT: 12 U/L (ref 0–44)
AST: 14 U/L — ABNORMAL LOW (ref 15–41)
Albumin: 3.9 g/dL (ref 3.5–5.0)
Alkaline Phosphatase: 29 U/L — ABNORMAL LOW (ref 38–126)
Anion gap: 8 (ref 5–15)
BUN: 17 mg/dL (ref 8–23)
CO2: 26 mmol/L (ref 22–32)
Calcium: 9.6 mg/dL (ref 8.9–10.3)
Chloride: 103 mmol/L (ref 98–111)
Creatinine, Ser: 0.75 mg/dL (ref 0.44–1.00)
GFR, Estimated: >60 mL/min (ref >60)
Glucose, Bld: 108 mg/dL — ABNORMAL HIGH (ref 70–99)
Potassium: 4 mmol/L (ref 3.5–5.1)
Sodium: 137 mmol/L (ref 135–145)
Total Bilirubin: 0.9 mg/dL (ref 0.3–1.2)
Total Protein: 6.8 g/dL (ref 6.5–8.1)

## 2020-05-09 LAB — URINALYSIS, ROUTINE W REFLEX MICROSCOPIC
Bacteria, UA: NONE SEEN
Bilirubin Urine: NEGATIVE
Glucose, UA: >=500 mg/dL — AB
Ketones, ur: NEGATIVE mg/dL
Leukocytes,Ua: NEGATIVE
Nitrite: NEGATIVE
Protein, ur: NEGATIVE mg/dL
Specific Gravity, Urine: 1.036 — ABNORMAL HIGH (ref 1.005–1.030)
pH: 5 (ref 5.0–8.0)

## 2020-05-09 LAB — CBC
HCT: 42.9 % (ref 36.0–46.0)
Hemoglobin: 13.4 g/dL (ref 12.0–15.0)
MCH: 30.9 pg (ref 26.0–34.0)
MCHC: 31.2 g/dL (ref 30.0–36.0)
MCV: 98.8 fL (ref 80.0–100.0)
Platelets: 269 10*3/uL (ref 150–400)
RBC: 4.34 MIL/uL (ref 3.87–5.11)
RDW: 13.1 % (ref 11.5–15.5)
WBC: 7 10*3/uL (ref 4.0–10.5)
nRBC: 0 % (ref 0.0–0.2)

## 2020-05-09 LAB — HEMOGLOBIN A1C
Hgb A1c MFr Bld: 5.9 % — ABNORMAL HIGH (ref 4.8–5.6)
Mean Plasma Glucose: 122.63 mg/dL

## 2020-05-09 LAB — SURGICAL PCR SCREEN
MRSA, PCR: NEGATIVE
Staphylococcus aureus: NEGATIVE

## 2020-05-09 LAB — GLUCOSE, CAPILLARY: Glucose-Capillary: 109 mg/dL — ABNORMAL HIGH (ref 70–99)

## 2020-05-09 NOTE — Progress Notes (Signed)
PCP - dr Jacqulynn Cadet  [facility] Cardiologist - DR [place]     Chest x-ray - NA EKG - 3/21 Stress Test - NA ECHO - 3/21 Cardiac Cath - 2/19  Sleep Study -NA  CPAP - NS  Fasting Blood Sugar - NOT KNOWN Checks Blood Sugar _OCC____ times a day  Aspirin Instructions:STOP  ERAS Protcol -YES PRE-SURGERY Ensure or G2-   NO G2 AVAILABLE,INSTRUCTED ON CLEAR LIGS  COVID TEST- FOR TUESDAY   Anesthesia review: HTN   HEART HX  Patient denies shortness of breath, fever, cough and chest pain at PAT appointment   All instructions explained to the patient, with a verbal understanding of the material. Patient agrees to go over the instructions while at home for a better understanding. Patient also instructed to self quarantine after being tested for COVID-19. The opportunity to ask questions was provided.

## 2020-05-10 ENCOUNTER — Telehealth: Payer: Self-pay | Admitting: Orthopaedic Surgery

## 2020-05-10 ENCOUNTER — Other Ambulatory Visit (HOSPITAL_COMMUNITY)
Admission: RE | Admit: 2020-05-10 | Discharge: 2020-05-10 | Disposition: A | Discharge status: Home / Self Care | Payer: Medicare Other | Source: Ambulatory Visit | Attending: Orthopaedic Surgery | Admitting: Orthopaedic Surgery

## 2020-05-10 DIAGNOSIS — Z20822 Contact with and (suspected) exposure to covid-19: Secondary | ICD-10-CM | POA: Insufficient documentation

## 2020-05-10 DIAGNOSIS — Z01812 Encounter for preprocedural laboratory examination: Secondary | ICD-10-CM | POA: Diagnosis present

## 2020-05-10 LAB — SARS CORONAVIRUS 2 (TAT 6-24 HRS): SARS Coronavirus 2: NEGATIVE

## 2020-05-10 NOTE — Telephone Encounter (Signed)
I called patient and advised. She also was told to drink presurgical water prior to procedure, however, they were out when she went for her pre-op appt so she was told to drink regular water. She is questioning how much. Patient advised 8oz water 3 hours before surgery time. She expressed understanding.

## 2020-05-10 NOTE — Telephone Encounter (Signed)
Stop ASA Now .   Other vitamins she took by mistake is OK. Tell her I will see her 11/19  Friday.

## 2020-05-10 NOTE — Telephone Encounter (Signed)
Please advise 

## 2020-05-10 NOTE — Progress Notes (Signed)
Anesthesia Chart Review:  Follows with cardiology for history of nonischemic cardiomyopathy with EF 40% by echo in June 2019. Coronary angiogram on 08/21/17 and revealed mid circumflex 50% and small with 2 distal diagonal 80% stenosis. She has mild Chronic stable angina pectoris, mild to moderate aortic insufficiency, hypertension, hyperlipidemia, former tobacco use with 15-pack-year history.  Last seen by Dr. Einar Gip 12/21/2019, noted to be stable from cardiac standpoint, no cardiac complaints.  Patient was cleared for surgery in letter dated 04/11/2020, "Julie Graves is at low risk, from a cardiac standpoint, for her upcoming procedure: Left TKA under spinal anesthesia.  It is ok to proceed without further cardiac testing."  Preop labs reviewed, unremarkable.  DM2 well-controlled with A1c 5.9.  EKG 09/09/2019: Normal sinus rhythm with rate of 62 bpm, left axis deviation, left anterior fascicular block.  Poor R progression, cannot exclude anteroseptal infarct old.  IVCD, LVH with strain.  Abnormal EKG. NO SIGNIFICANT CHANGE FROM EKG 03/09/2019.   Echocardiogram 09/21/2019:  Left ventricle cavity is normal in size. Moderate concentric hypertrophy  of the left ventricle. Moderate global hypokinesis. LVEF 40-45%. Grade I  diastolic function. Normal left atrial pressure.   Left atrial cavity is severely dilated. LA Vol index 51 ml/m2.  Trileaflet aortic valve with mild aortic valve leaflet calcification.  Moderate (Grade II) aortic regurgitation.  Moderate (Grade II) mitral regurgitation.  Moderate tricuspid regurgitation. Estimated pulmonary artery systolic  pressure is 20 mmHg.  Mild pulmonic regurgitation. No significant change from 12/20/2017.  Coronary angiogram 07/2017: mid circumflex 50%, D1 ostial 50% and mid 80%, very small. Mild noncritical CAD other vessels. EF 45%.   Wynonia Musty [facility] Short Stay Center/Anesthesiology Phone [phone] 05/10/2020 4:20 PM

## 2020-05-10 NOTE — Anesthesia Preprocedure Evaluation (Addendum)
Anesthesia Evaluation  Patient identified by MRN, date of birth, ID band Patient awake    Reviewed: Allergy & Precautions, NPO status , Patient's Chart, lab work & pertinent test results, reviewed documented beta blocker date and time   Airway Mallampati: II  TM Distance: >3 FB Neck ROM: Full    Dental no notable dental hx. (+) Teeth Intact, Dental Advisory Given   Pulmonary neg pulmonary ROS, former smoker,    Pulmonary exam normal breath sounds clear to auscultation       Cardiovascular hypertension, Pt. on home beta blockers and Pt. on medications + angina + CAD and +CHF  Normal cardiovascular exam+ Valvular Problems/Murmurs AI and MR  Rhythm:Regular Rate:Normal  EKG 09/09/2019: Normal sinus rhythm with rate of 62 bpm, left axis deviation, left anterior fascicular block. Poor R progression, cannot exclude anteroseptal infarct old. IVCD, LVH with strain. Abnormal EKG. NO SIGNIFICANT CHANGE FROM EKG 03/09/2019.   Echocardiogram 09/21/2019:  Left ventricle cavity is normal in size. Moderate concentric hypertrophy of the left ventricle. Moderate global hypokinesis. LVEF 40-45%. Grade I diastolic function. Normal left atrial pressure.   Left atrial cavity is severely dilated. LA Vol index 51 ml/m2.  Trileaflet aortic valve with mild aortic valve leaflet calcification.  Moderate (Grade II) aortic regurgitation.  Moderate (Grade II) mitral regurgitation.  Moderate tricuspid regurgitation. Estimated pulmonary artery systolic pressure is 20 mmHg.  Mild pulmonic regurgitation. No significant change from06/28/2019.  Coronary angiogram 07/2017: mid circumflex 50%, D1 ostial 50% and mid 80%, very small. Mild noncritical CAD other vessels. EF 45%.   Neuro/Psych PSYCHIATRIC DISORDERS Anxiety Depression negative neurological ROS     GI/Hepatic Neg liver ROS, GERD  ,  Endo/Other  diabetes, Oral Hypoglycemic AgentsMorbid obesity (BMI  43)  Renal/GU negative Renal ROS  negative genitourinary   Musculoskeletal  (+) Arthritis ,   Abdominal   Peds  Hematology negative hematology ROS (+)   Anesthesia Other Findings   Reproductive/Obstetrics                           Anesthesia Physical Anesthesia Plan  ASA: III  Anesthesia Plan: Spinal and Regional   Post-op Pain Management:  Regional for Post-op pain   Induction:   PONV Risk Score and Plan: 2 and Treatment may vary due to age or medical condition, Propofol infusion and Ondansetron  Airway Management Planned: Natural Airway  Additional Equipment:   Intra-op Plan:   Post-operative Plan:   Informed Consent: I have reviewed the patients History and Physical, chart, labs and discussed the procedure including the risks, benefits and alternatives for the proposed anesthesia with the patient or authorized representative who has indicated his/her understanding and acceptance.     Dental advisory given  Plan Discussed with: CRNA  Anesthesia Plan Comments: ( )       Anesthesia Quick Evaluation

## 2020-05-10 NOTE — Telephone Encounter (Signed)
Patient called. She would like to know when she should stop talking her aspirin and also she took her vitamin 600 calcium pill 2000 vitamin D pill by mistake this morning. She would like a call back [phone]

## 2020-05-12 NOTE — H&P (Signed)
TOTAL KNEE ADMISSION H&P  Patient is being admitted for left total knee arthroplasty.  Subjective:  Chief Complaint:left knee pain.  HPI: Julie Graves, 79 y.o. female, has a history of pain and functional disability in the left knee due to arthritis and has failed non-surgical conservative treatments for greater than 12 weeks to includeNSAID's and/or analgesics, corticosteriod injections, supervised PT with diminished ADL's post treatment, use of assistive devices and activity modification.  Onset of symptoms was gradual, starting 10 years ago with gradually worsening course since that time.   Patient currently rates pain in the left knee(s) at 10 out of 10 with activity. Patient has night pain, worsening of pain with activity and weight bearing, pain that interferes with activities of daily living, pain with passive range of motion, crepitus and joint swelling.  Patient has evidence of subchondral cysts, periarticular osteophytes and joint space narrowing by imaging studies.  There is no active infection.  Patient Active Problem List   Diagnosis Date Noted  . Unilateral primary osteoarthritis, left knee 03/31/2020  . History of total knee arthroplasty, right 03/31/2020  . Precordial pain   . Angina pectoris ([place]) 08/13/2017  . Mixed dyslipidemia 08/13/2017  . CAD (coronary artery disease), native coronary artery 08/13/2017  . Mild episode of recurrent major depressive disorder ([place]) 11/03/2013  . Urge incontinence 04/02/2013  . Spinal stenosis, lumbar region, without neurogenic claudication 10/27/2012  . Spinal stenosis of lumbar region 10/01/2012  . Allergic rhinitis 09/26/2011  . Dizziness 01/23/2011  . Dyspnea 01/23/2011  . Type II diabetes mellitus ([place]) 06/27/2010  . Morbid obesity ([place]) 06/27/2010  . Impaired glucose tolerance 06/27/2010  . Disorder of bone and cartilage 06/22/2010  . Primary cardiomyopathy ([place]) 03/23/2010  . HYPERLIPIDEMIA-MIXED 03/07/2010  . Essential  hypertension 03/07/2010  . CARDIOMEGALY 11/30/2009  . OTHER DYSPNEA AND RESPIRATORY ABNORMALITIES 11/30/2009  . AORTIC REGURGITATION 09/28/2009  . Chronic diastolic heart failure ([place]) 09/28/2009  . Aortic regurgitation 09/28/2009  . 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 Osteoarthrosis involving multiple sites 10/26/2008   Past Medical History:  Diagnosis Date  . Anginal pain ([place])    "small pain?nerves in chest"started 10/05/13   . Anginal pain ([place])    cleared by cardiology 3/15  . Anxiety    rx given recent not taken yet  . Aortic insufficiency    mild on 9/11 cath  . CAD (coronary artery disease)    nonobstructive let heart cath 3/11: 40% ostial D1, 30% mid CFX  . CHF (congestive heart failure) ([place])   . Colon polyps 02/01/2009    MULTIPLE FRAGMENTS OF TUBULAR ADENOMAS  . Diabetes mellitus   . Frozen shoulder   . GERD (gastroesophageal reflux disease)    occ  . History of blood transfusion    pregnancy  . HLD (hyperlipidemia)   . HTN (hypertension)    ACEI cough  . Hx of cardiovascular stress test    Lexiscan Myoview (10/2013):  Fixed ant defect most c/w breast attenuation, cannot exclude apical infarct; no ischemia, EF 46%; Low Risk  . Nonischemic cardiomyopathy ([place])    Ech 3/11 difficult study, moderate aortic insuficiency noted. Left evntriculogram 3/11  . Obese   . Osteoarthritis   . Palpitation    PACs noted on telemetry while in the hospital  . Pneumonia    hx  . Stress incontinence     Past Surgical History:  Procedure Laterality Date  . ABDOMINAL HYSTERECTOMY    . APPENDECTOMY    . BLADDER NECK SUSPENSION  85  . CARDIAC CATHETERIZATION    . EYE SURGERY    . HAND SURGERY     right "had knots cut out"  . KNEE ARTHROPLASTY Right 12/09/2013   Procedure: COMPUTER ASSISTED Right TOTAL KNEE ARTHROPLASTY;  Surgeon: Marybelle Killings, MD;  Location: [facility];  Service: Orthopedics;  Laterality: Right;  . LEFT HEART CATH AND CORONARY ANGIOGRAPHY N/A 08/15/2017    Procedure: LEFT HEART CATH AND CORONARY ANGIOGRAPHY;  Surgeon: Jettie Booze, MD;  Location: [facility];  Service: Cardiovascular;  Laterality: N/A;  . TUBAL LIGATION      No current facility-administered medications for this encounter.   Current Outpatient Medications  Medication Sig Dispense Refill Last Dose  . acetaminophen (TYLENOL) 500 MG tablet Take 1,000 mg by mouth every 6 (six) hours as needed for moderate pain or headache.      . alendronate (FOSAMAX) 70 MG tablet Take 70 mg by mouth every Monday. Take with a full glass of water on an empty stomach. Take on mondays     . aspirin 81 MG tablet Take 81 mg by mouth at bedtime.      . Calcium Carb-Cholecalciferol (CALCIUM 600/VITAMIN D3 PO) Take 1 tablet by mouth daily.      . carvedilol (COREG) 6.25 MG tablet Take 6.25 mg by mouth 2 (two) times daily.       . Cholecalciferol (VITAMIN D) 2000 units CAPS Take 2,000 Units by mouth daily.      . diazepam (VALIUM) 2 MG tablet Take 2 mg by mouth daily as needed for anxiety.      Marland Kitchen EPINEPHrine 0.3 mg/0.3 mL IJ SOAJ injection Inject 0.3 mLs (0.3 mg total) into the muscle as needed for anaphylaxis. 1 Device 0   . fluticasone (FLONASE) 50 MCG/ACT nasal spray Place 1 spray into both nostrils daily as needed for allergies or rhinitis.     . furosemide (LASIX) 40 MG tablet Take 40 mg by mouth daily as needed for fluid or edema.      Marland Kitchen JARDIANCE 10 MG TABS tablet TAKE 1 TABLET BY MOUTH DAILY BEFORE BREAKFAST (Patient taking differently: Take 10 mg by mouth daily before breakfast. ) 30 tablet 6   . losartan (COZAAR) 100 MG tablet Take 1 tablet (100 mg total) by mouth daily. 90 tablet 3   . metFORMIN (GLUCOPHAGE-XR) 500 MG 24 hr tablet Take 500 mg by mouth daily.     . nitroGLYCERIN (NITROSTAT) 0.4 MG SL tablet Place 0.4 mg under the tongue every 5 (five) minutes as needed for chest pain.     . potassium chloride SA (K-DUR) 20 MEQ tablet Take 20 mEq by mouth daily as needed (when taking  furosemide).      . simvastatin (ZOCOR) 40 MG tablet Take 40 mg by mouth at bedtime.       Marland Kitchen spironolactone (ALDACTONE) 25 MG tablet TAKE 1 TABLET(25 MG) BY MOUTH DAILY (Patient taking differently: Take 25 mg by mouth daily. ) 90 tablet 2    Allergies  Allergen Reactions  . Topiramate     Other reaction(s): Chest Pain  . Shellfish Allergy Swelling and Other (See Comments)    Swelling - throat and lips  . Iodine Hives and Swelling  . Lisinopril Other (See Comments)    cough  . Metamucil [Psyllium]   . Tramadol Other (See Comments)    confusion  . Penicillins Hives, Rash and Other (See Comments)    Has patient had a PCN reaction causing  immediate rash, facial/tongue/throat swelling, SOB or lightheadedness with hypotension: Unknown Has patient had a PCN reaction causing severe rash involving mucus membranes or skin necrosis: No Has patient had a PCN reaction that required hospitalization: No Has patient had a PCN reaction occurring within the last 10 years: No If all of the above answers are "NO", then may proceed with Cephalosporin use.     Social History   Tobacco Use  . Smoking status: Former Smoker    Packs/day: 1.00    Years: 15.00    Pack years: 15.00    Types: Cigarettes    Quit date: 10/10/1983    Years since quitting: 36.6  . Smokeless tobacco: Never Used  . Tobacco comment: Quit 1970s  Substance Use Topics  . Alcohol use: No    Alcohol/week: 0.0 standard drinks    Family History  Problem Relation Age of Onset  . Heart attack Father   . Heart attack Sister   . Stroke Mother 27  . Diabetes Sister   . Heart disease Sister   . Diabetes Brother   . Colon cancer Brother   . Breast cancer Neg Hx      Review of Systems  Constitutional: Positive for activity change.  HENT: Negative.   Respiratory: Negative.   Cardiovascular: Negative.   Gastrointestinal: Negative.   Genitourinary: Negative.   Musculoskeletal: Positive for gait problem and joint swelling.   Skin: Negative.   Psychiatric/Behavioral: Negative.     Objective:  Physical Exam HENT:     Head: Normocephalic and atraumatic.     Nose: Nose normal.  Eyes:     Extraocular Movements: Extraocular movements intact.     Pupils: Pupils are equal, round, and reactive to light.  Cardiovascular:     Rate and Rhythm: Regular rhythm.  Pulmonary:     Effort: Pulmonary effort is normal. No respiratory distress.     Breath sounds: Normal breath sounds.  Abdominal:     General: There is no distension.  Musculoskeletal:        General: Swelling and tenderness present.     Cervical back: Normal range of motion.  Skin:    General: Skin is warm and dry.  Neurological:     Mental Status: She is alert and oriented to person, place, and time.  Psychiatric:        Mood and Affect: Mood normal.     Vital signs in last 24 hours:    Labs:   Estimated body mass index is 42.75 kg/m as calculated from the following:   Height as of 05/09/20: 5' (1.524 m).   Weight as of 05/09/20: 99.3 kg.   Imaging Review Plain radiographs demonstrate moderate degenerative joint disease of the left knee(s). The overall alignment ismild varus. The bone quality appears to be good for age and reported activity level.      Assessment/Plan:  End stage arthritis, left knee   The patient history, physical examination, clinical judgment of the provider and imaging studies are consistent with end stage degenerative joint disease of the left knee(s) and total knee arthroplasty is deemed medically necessary. The treatment options including medical management, injection therapy arthroscopy and arthroplasty were discussed at length. The risks and benefits of total knee arthroplasty were presented and reviewed. The risks due to aseptic loosening, infection, stiffness, patella tracking problems, thromboembolic complications and other imponderables were discussed. The patient acknowledged the explanation, agreed to  proceed with the plan and consent was signed. Patient is being admitted for inpatient  treatment for surgery, pain control, PT, OT, prophylactic antibiotics, VTE prophylaxis, progressive ambulation and ADL's and discharge planning. The patient is planning to be discharged to skilled nursing facility    Anticipated LOS equal to or greater than 2 midnights due to - Age 62 and older with one or more of the following:  - Obesity  - Expected need for hospital services (PT, OT, Nursing) required for safe  discharge  - Anticipated need for postoperative skilled nursing care or inpatient rehab  - Active co-morbidities: Diabetes, Coronary Artery Disease and Heart Failure OR   - Unanticipated findings during/Post Surgery: None  - Patient is a high risk of re-admission due to: None

## 2020-05-13 ENCOUNTER — Observation Stay (HOSPITAL_COMMUNITY)
Admission: RE | Admit: 2020-05-13 | Discharge: 2020-05-19 | Disposition: A | Discharge status: Home / Self Care | Payer: Medicare Other | Attending: Orthopaedic Surgery | Admitting: Orthopaedic Surgery

## 2020-05-13 ENCOUNTER — Ambulatory Visit (HOSPITAL_COMMUNITY): Payer: Medicare Other | Admitting: Physician Assistant

## 2020-05-13 ENCOUNTER — Other Ambulatory Visit: Payer: Self-pay

## 2020-05-13 ENCOUNTER — Ambulatory Visit (HOSPITAL_COMMUNITY): Payer: Medicare Other | Admitting: Anesthesiology

## 2020-05-13 ENCOUNTER — Encounter (HOSPITAL_COMMUNITY)
Admission: RE | Disposition: A | Discharge status: Home / Self Care | Payer: Self-pay | Source: Home / Self Care | Attending: Orthopaedic Surgery

## 2020-05-13 ENCOUNTER — Encounter (HOSPITAL_COMMUNITY): Payer: Self-pay | Admitting: Orthopaedic Surgery

## 2020-05-13 ENCOUNTER — Inpatient Hospital Stay (HOSPITAL_COMMUNITY): Payer: Medicare Other

## 2020-05-13 DIAGNOSIS — Z7982 Long term (current) use of aspirin: Secondary | ICD-10-CM | POA: Insufficient documentation

## 2020-05-13 DIAGNOSIS — M1712 Unilateral primary osteoarthritis, left knee: Secondary | ICD-10-CM | POA: Diagnosis not present

## 2020-05-13 DIAGNOSIS — Z7984 Long term (current) use of oral hypoglycemic drugs: Secondary | ICD-10-CM | POA: Diagnosis not present

## 2020-05-13 DIAGNOSIS — Z09 Encounter for follow-up examination after completed treatment for conditions other than malignant neoplasm: Principal | ICD-10-CM

## 2020-05-13 DIAGNOSIS — I5032 Chronic diastolic (congestive) heart failure: Secondary | ICD-10-CM | POA: Insufficient documentation

## 2020-05-13 DIAGNOSIS — Z20822 Contact with and (suspected) exposure to covid-19: Secondary | ICD-10-CM | POA: Diagnosis not present

## 2020-05-13 DIAGNOSIS — Z96651 Presence of right artificial knee joint: Secondary | ICD-10-CM | POA: Insufficient documentation

## 2020-05-13 DIAGNOSIS — I25119 Atherosclerotic heart disease of native coronary artery with unspecified angina pectoris: Secondary | ICD-10-CM | POA: Diagnosis not present

## 2020-05-13 DIAGNOSIS — Z96652 Presence of left artificial knee joint: Secondary | ICD-10-CM

## 2020-05-13 DIAGNOSIS — I110 Hypertensive heart disease with heart failure: Secondary | ICD-10-CM | POA: Diagnosis not present

## 2020-05-13 DIAGNOSIS — Z87891 Personal history of nicotine dependence: Secondary | ICD-10-CM | POA: Insufficient documentation

## 2020-05-13 DIAGNOSIS — Z79899 Other long term (current) drug therapy: Secondary | ICD-10-CM | POA: Insufficient documentation

## 2020-05-13 DIAGNOSIS — E119 Type 2 diabetes mellitus without complications: Secondary | ICD-10-CM | POA: Diagnosis not present

## 2020-05-13 DIAGNOSIS — M25562 Pain in left knee: Secondary | ICD-10-CM | POA: Diagnosis present

## 2020-05-13 HISTORY — PX: TOTAL KNEE ARTHROPLASTY: SHX125

## 2020-05-13 LAB — GLUCOSE, CAPILLARY
Glucose-Capillary: 102 mg/dL — ABNORMAL HIGH (ref 70–99)
Glucose-Capillary: 93 mg/dL (ref 70–99)
Glucose-Capillary: 134 mg/dL — ABNORMAL HIGH (ref 70–99)
Glucose-Capillary: 151 mg/dL — ABNORMAL HIGH (ref 70–99)

## 2020-05-13 LAB — HEMOGLOBIN A1C
Hgb A1c MFr Bld: 5.9 % — ABNORMAL HIGH (ref 4.8–5.6)
Mean Plasma Glucose: 122.63 mg/dL

## 2020-05-13 SURGERY — ARTHROPLASTY, KNEE, TOTAL
Anesthesia: Regional | Site: Knee | Laterality: Left

## 2020-05-13 MED ORDER — VANCOMYCIN HCL IN DEXTROSE 1-5 GM/200ML-% IV SOLN: 1000.0000 mg | INTRAVENOUS | Status: AC

## 2020-05-13 MED ORDER — ASPIRIN EC 325 MG PO TBEC
325.0000 mg | DELAYED_RELEASE_TABLET | Freq: Every day | ORAL | Status: DC
  Administered 2020-05-14 – 2020-05-18 (×5): 325 mg via ORAL
  Filled 2020-05-13 (×5): qty 1

## 2020-05-13 MED ORDER — NITROGLYCERIN 0.4 MG SL SUBL: 0.4000 mg | SUBLINGUAL_TABLET | SUBLINGUAL | Status: DC | PRN

## 2020-05-13 MED ORDER — METOCLOPRAMIDE HCL 5 MG PO TABS: 5.0000 mg | ORAL_TABLET | Freq: Three times a day (TID) | ORAL | Status: DC | PRN

## 2020-05-13 MED ORDER — METOCLOPRAMIDE HCL 5 MG/ML IJ SOLN: 5.0000 mg | Freq: Three times a day (TID) | INTRAMUSCULAR | Status: DC | PRN

## 2020-05-13 MED ORDER — 0.9 % SODIUM CHLORIDE (POUR BTL) OPTIME
TOPICAL | Status: DC | PRN
  Administered 2020-05-13: 1000 mL

## 2020-05-13 MED ORDER — TRANEXAMIC ACID-NACL 1000-0.7 MG/100ML-% IV SOLN
INTRAVENOUS | Status: AC
  Filled 2020-05-13: qty 100

## 2020-05-13 MED ORDER — CHLORHEXIDINE GLUCONATE 0.12 % MT SOLN
OROMUCOSAL | Status: AC
  Filled 2020-05-13: qty 15

## 2020-05-13 MED ORDER — METHOCARBAMOL 500 MG PO TABS
500.0000 mg | ORAL_TABLET | Freq: Four times a day (QID) | ORAL | Status: DC | PRN
  Administered 2020-05-13 – 2020-05-15 (×5): 500 mg via ORAL
  Filled 2020-05-13 (×9): qty 1

## 2020-05-13 MED ORDER — FENTANYL CITRATE (PF) 100 MCG/2ML IJ SOLN
INTRAMUSCULAR | Status: AC
  Administered 2020-05-13: 100 ug via INTRAVENOUS
  Filled 2020-05-13: qty 2

## 2020-05-13 MED ORDER — CHLORHEXIDINE GLUCONATE 0.12 % MT SOLN
15.0000 mL | Freq: Once | OROMUCOSAL | Status: AC
  Filled 2020-05-13: qty 15

## 2020-05-13 MED ORDER — SODIUM CHLORIDE 0.9 % IV SOLN: INTRAVENOUS | Status: DC

## 2020-05-13 MED ORDER — ACETAMINOPHEN 500 MG PO TABS
1000.0000 mg | ORAL_TABLET | Freq: Once | ORAL | Status: AC
  Filled 2020-05-13: qty 2

## 2020-05-13 MED ORDER — SPIRONOLACTONE 25 MG PO TABS
25.0000 mg | ORAL_TABLET | Freq: Every day | ORAL | Status: DC
  Administered 2020-05-13 – 2020-05-18 (×6): 25 mg via ORAL
  Filled 2020-05-13 (×6): qty 1

## 2020-05-13 MED ORDER — VITAMIN D 25 MCG (1000 UNIT) PO TABS
2000.0000 [IU] | ORAL_TABLET | Freq: Every day | ORAL | Status: DC
  Administered 2020-05-13 – 2020-05-18 (×6): 2000 [IU] via ORAL
  Filled 2020-05-13 (×6): qty 2

## 2020-05-13 MED ORDER — TRANEXAMIC ACID-NACL 1000-0.7 MG/100ML-% IV SOLN
INTRAVENOUS | Status: DC | PRN
  Administered 2020-05-13: 1000 mg via INTRAVENOUS

## 2020-05-13 MED ORDER — ONDANSETRON HCL 4 MG PO TABS: 4.0000 mg | ORAL_TABLET | Freq: Four times a day (QID) | ORAL | Status: DC | PRN

## 2020-05-13 MED ORDER — BUPIVACAINE HCL (PF) 0.25 % IJ SOLN
INTRAMUSCULAR | Status: DC | PRN
  Administered 2020-05-13: 30 mL

## 2020-05-13 MED ORDER — METFORMIN HCL ER 500 MG PO TB24
500.0000 mg | ORAL_TABLET | Freq: Every day | ORAL | Status: DC
  Administered 2020-05-14 – 2020-05-18 (×5): 500 mg via ORAL
  Filled 2020-05-13 (×5): qty 1

## 2020-05-13 MED ORDER — HYDROMORPHONE HCL 1 MG/ML IJ SOLN
0.5000 mg | INTRAMUSCULAR | Status: DC | PRN
  Administered 2020-05-14 – 2020-05-17 (×2): 0.5 mg via INTRAVENOUS
  Filled 2020-05-13 (×3): qty 0.5

## 2020-05-13 MED ORDER — PROPOFOL 500 MG/50ML IV EMUL
INTRAVENOUS | Status: DC | PRN
  Administered 2020-05-13: 80 ug/kg/min via INTRAVENOUS

## 2020-05-13 MED ORDER — METHOCARBAMOL 1000 MG/10ML IJ SOLN
500.0000 mg | Freq: Four times a day (QID) | INTRAVENOUS | Status: DC | PRN
  Filled 2020-05-13: qty 5

## 2020-05-13 MED ORDER — PHENOL 1.4 % MT LIQD: 1.0000 | OROMUCOSAL | Status: DC | PRN

## 2020-05-13 MED ORDER — ROPIVACAINE HCL 5 MG/ML IJ SOLN
INTRAMUSCULAR | Status: DC | PRN
  Administered 2020-05-13: 20 mL via PERINEURAL

## 2020-05-13 MED ORDER — FENTANYL CITRATE (PF) 100 MCG/2ML IJ SOLN
100.0000 ug | Freq: Once | INTRAMUSCULAR | Status: AC
  Filled 2020-05-13: qty 2

## 2020-05-13 MED ORDER — POLYETHYLENE GLYCOL 3350 17 G PO PACK
17.0000 g | PACK | Freq: Every day | ORAL | Status: DC | PRN
  Filled 2020-05-13: qty 1

## 2020-05-13 MED ORDER — SIMVASTATIN 20 MG PO TABS
40.0000 mg | ORAL_TABLET | Freq: Every day | ORAL | Status: DC
  Administered 2020-05-13 – 2020-05-18 (×6): 40 mg via ORAL
  Filled 2020-05-13 (×6): qty 2

## 2020-05-13 MED ORDER — BUPIVACAINE HCL (PF) 0.25 % IJ SOLN
INTRAMUSCULAR | Status: AC
  Filled 2020-05-13: qty 30

## 2020-05-13 MED ORDER — OXYCODONE HCL 5 MG PO TABS
5.0000 mg | ORAL_TABLET | ORAL | Status: DC | PRN
  Administered 2020-05-13 – 2020-05-18 (×15): 5 mg via ORAL
  Filled 2020-05-13 (×16): qty 1

## 2020-05-13 MED ORDER — PHENYLEPHRINE HCL-NACL 10-0.9 MG/250ML-% IV SOLN
INTRAVENOUS | Status: DC | PRN
  Administered 2020-05-13: 40 ug/min via INTRAVENOUS

## 2020-05-13 MED ORDER — BUPIVACAINE IN DEXTROSE 0.75-8.25 % IT SOLN
INTRATHECAL | Status: DC | PRN
  Administered 2020-05-13: 1.6 mL via INTRATHECAL

## 2020-05-13 MED ORDER — CARVEDILOL 6.25 MG PO TABS
6.2500 mg | ORAL_TABLET | Freq: Two times a day (BID) | ORAL | Status: DC
  Administered 2020-05-13 – 2020-05-18 (×9): 6.25 mg via ORAL
  Filled 2020-05-13 (×11): qty 1

## 2020-05-13 MED ORDER — EMPAGLIFLOZIN 10 MG PO TABS
10.0000 mg | ORAL_TABLET | Freq: Every day | ORAL | Status: DC
  Administered 2020-05-14 – 2020-05-18 (×5): 10 mg via ORAL
  Filled 2020-05-13 (×6): qty 1

## 2020-05-13 MED ORDER — CEFAZOLIN SODIUM-DEXTROSE 2-4 GM/100ML-% IV SOLN
INTRAVENOUS | Status: AC
  Filled 2020-05-13: qty 100

## 2020-05-13 MED ORDER — DOCUSATE SODIUM 100 MG PO CAPS
100.0000 mg | ORAL_CAPSULE | Freq: Two times a day (BID) | ORAL | Status: DC
  Administered 2020-05-13 – 2020-05-18 (×9): 100 mg via ORAL
  Filled 2020-05-13 (×11): qty 1

## 2020-05-13 MED ORDER — CHLORHEXIDINE GLUCONATE 0.12 % MT SOLN
OROMUCOSAL | Status: AC
  Administered 2020-05-13: 15 mL via OROMUCOSAL
  Filled 2020-05-13: qty 15

## 2020-05-13 MED ORDER — ONDANSETRON HCL 4 MG/2ML IJ SOLN: 4.0000 mg | Freq: Four times a day (QID) | INTRAMUSCULAR | Status: DC | PRN

## 2020-05-13 MED ORDER — LOSARTAN POTASSIUM 50 MG PO TABS
100.0000 mg | ORAL_TABLET | Freq: Every day | ORAL | Status: DC
  Administered 2020-05-13 – 2020-05-18 (×6): 100 mg via ORAL
  Filled 2020-05-13 (×6): qty 2

## 2020-05-13 MED ORDER — ASPIRIN EC 325 MG PO TBEC: 325.0000 mg | DELAYED_RELEASE_TABLET | Freq: Every day | ORAL | 0 refills | Status: DC

## 2020-05-13 MED ORDER — LACTATED RINGERS IV SOLN: INTRAVENOUS | Status: DC

## 2020-05-13 MED ORDER — FENTANYL CITRATE (PF) 100 MCG/2ML IJ SOLN: 25.0000 ug | INTRAMUSCULAR | Status: DC | PRN

## 2020-05-13 MED ORDER — ACETAMINOPHEN 500 MG PO TABS
ORAL_TABLET | ORAL | Status: AC
  Administered 2020-05-13: 1000 mg via ORAL
  Filled 2020-05-13: qty 2

## 2020-05-13 MED ORDER — BUPIVACAINE LIPOSOME 1.3 % IJ SUSP
INTRAMUSCULAR | Status: DC | PRN
  Administered 2020-05-13: 20 mL

## 2020-05-13 MED ORDER — ALENDRONATE SODIUM 70 MG PO TABS
70.0000 mg | ORAL_TABLET | ORAL | Status: DC
  Filled 2020-05-13: qty 1

## 2020-05-13 MED ORDER — POTASSIUM CHLORIDE CRYS ER 20 MEQ PO TBCR: 20.0000 meq | EXTENDED_RELEASE_TABLET | Freq: Every day | ORAL | Status: DC | PRN

## 2020-05-13 MED ORDER — OXYCODONE-ACETAMINOPHEN 5-325 MG PO TABS
1.0000 | ORAL_TABLET | ORAL | 0 refills | Status: DC | PRN
Start: 2020-05-13 — End: 2020-08-26

## 2020-05-13 MED ORDER — INSULIN ASPART 100 UNIT/ML ~~LOC~~ SOLN: 0.0000 [IU] | Freq: Every day | SUBCUTANEOUS | Status: DC

## 2020-05-13 MED ORDER — DEXAMETHASONE SODIUM PHOSPHATE 10 MG/ML IJ SOLN
INTRAMUSCULAR | Status: DC | PRN
  Administered 2020-05-13: 5 mg

## 2020-05-13 MED ORDER — VANCOMYCIN HCL IN DEXTROSE 1-5 GM/200ML-% IV SOLN
INTRAVENOUS | Status: AC
  Administered 2020-05-13: 1000 mg via INTRAVENOUS
  Filled 2020-05-13: qty 200

## 2020-05-13 MED ORDER — FUROSEMIDE 40 MG PO TABS: 40.0000 mg | ORAL_TABLET | Freq: Every day | ORAL | Status: DC | PRN

## 2020-05-13 MED ORDER — ACETAMINOPHEN 325 MG PO TABS
325.0000 mg | ORAL_TABLET | Freq: Four times a day (QID) | ORAL | Status: DC | PRN
  Administered 2020-05-13 – 2020-05-15 (×2): 650 mg via ORAL
  Filled 2020-05-13 (×2): qty 2

## 2020-05-13 MED ORDER — ONDANSETRON HCL 4 MG/2ML IJ SOLN
INTRAMUSCULAR | Status: DC | PRN
  Administered 2020-05-13: 4 mg via INTRAVENOUS

## 2020-05-13 MED ORDER — INSULIN ASPART 100 UNIT/ML ~~LOC~~ SOLN
0.0000 [IU] | Freq: Three times a day (TID) | SUBCUTANEOUS | Status: DC
  Administered 2020-05-13 – 2020-05-16 (×2): 3 [IU] via SUBCUTANEOUS
  Administered 2020-05-16: 4 [IU] via SUBCUTANEOUS
  Administered 2020-05-17: 3 [IU] via SUBCUTANEOUS

## 2020-05-13 MED ORDER — MIDAZOLAM HCL 2 MG/2ML IJ SOLN
INTRAMUSCULAR | Status: AC
  Filled 2020-05-13: qty 2

## 2020-05-13 MED ORDER — MENTHOL 3 MG MT LOZG: 1.0000 | LOZENGE | OROMUCOSAL | Status: DC | PRN

## 2020-05-13 MED ORDER — FLUTICASONE PROPIONATE 50 MCG/ACT NA SUSP
1.0000 | Freq: Every day | NASAL | Status: DC | PRN
  Filled 2020-05-13: qty 16

## 2020-05-13 MED ORDER — EPINEPHRINE 0.3 MG/0.3ML IJ SOAJ: 0.3000 mg | INTRAMUSCULAR | Status: DC | PRN

## 2020-05-13 MED ORDER — BUPIVACAINE LIPOSOME 1.3 % IJ SUSP
20.0000 mL | Freq: Once | INTRAMUSCULAR | Status: DC
  Filled 2020-05-13: qty 20

## 2020-05-13 MED ORDER — PROPOFOL 10 MG/ML IV BOLUS
INTRAVENOUS | Status: DC | PRN
  Administered 2020-05-13: 20 mg via INTRAVENOUS
  Administered 2020-05-13: 30 mg via INTRAVENOUS

## 2020-05-13 MED ORDER — SODIUM CHLORIDE 0.9 % IR SOLN
Status: DC | PRN
  Administered 2020-05-13: 3000 mL

## 2020-05-13 SURGICAL SUPPLY — 77 items
ATTUNE MED DOME PAT 32 KNEE (Knees) ×2 IMPLANT
ATTUNE MED DOME PAT 32MM KNEE (Knees) ×1 IMPLANT
ATTUNE PS FEM LT SZ 3 CEM KNEE (Femur) ×3 IMPLANT
ATTUNE PSRP INSR SZ3 5 KNEE (Insert) ×2 IMPLANT
ATTUNE PSRP INSR SZ3 5MM KNEE (Insert) ×1 IMPLANT
BANDAGE ESMARK 6X9 LF (GAUZE/BANDAGES/DRESSINGS) ×1 IMPLANT
BASE TIBIAL ROT PLAT SZ 3 KNEE (Knees) ×1 IMPLANT
BENZOIN TINCTURE PRP APPL 2/3 (GAUZE/BANDAGES/DRESSINGS) IMPLANT
BLADE SAGITTAL 25.0X1.19X90 (BLADE) ×2 IMPLANT
BLADE SAGITTAL 25.0X1.19X90MM (BLADE) ×1
BLADE SAW SGTL 13X75X1.27 (BLADE) ×3 IMPLANT
BNDG CMPR 9X6 STRL LF SNTH (GAUZE/BANDAGES/DRESSINGS) ×1
BNDG ELASTIC 4X5.8 VLCR STR LF (GAUZE/BANDAGES/DRESSINGS) ×3 IMPLANT
BNDG ELASTIC 6X5.8 VLCR STR LF (GAUZE/BANDAGES/DRESSINGS) ×3 IMPLANT
BNDG ESMARK 6X9 LF (GAUZE/BANDAGES/DRESSINGS) ×3
BOWL SMART MIX CTS (DISPOSABLE) ×3 IMPLANT
BSPLAT TIB 3 CMNT ROT PLAT STR (Knees) ×1 IMPLANT
CEMENT HV SMART SET (Cement) ×6 IMPLANT
COVER SURGICAL LIGHT HANDLE (MISCELLANEOUS) ×3 IMPLANT
COVER WAND RF STERILE (DRAPES) ×3 IMPLANT
CUFF TOURN SGL QUICK 34 (TOURNIQUET CUFF) ×3
CUFF TOURN SGL QUICK 42 (TOURNIQUET CUFF) IMPLANT
CUFF TRNQT CYL 34X4.125X (TOURNIQUET CUFF) ×1 IMPLANT
DRAPE ORTHO SPLIT 77X108 STRL (DRAPES) ×6
DRAPE SURG ORHT 6 SPLT 77X108 (DRAPES) ×2 IMPLANT
DRAPE U-SHAPE 47X51 STRL (DRAPES) ×3 IMPLANT
DRSG PAD ABDOMINAL 8X10 ST (GAUZE/BANDAGES/DRESSINGS) ×6 IMPLANT
DURAPREP 26ML APPLICATOR (WOUND CARE) ×6 IMPLANT
ELECT REM PT RETURN 9FT ADLT (ELECTROSURGICAL) ×3
ELECTRODE REM PT RTRN 9FT ADLT (ELECTROSURGICAL) ×1 IMPLANT
EVACUATOR 1/8 PVC DRAIN (DRAIN) IMPLANT
FACESHIELD WRAPAROUND (MASK) ×6 IMPLANT
GAUZE SPONGE 4X4 12PLY STRL (GAUZE/BANDAGES/DRESSINGS) ×3 IMPLANT
GAUZE XEROFORM 5X9 LF (GAUZE/BANDAGES/DRESSINGS) ×3 IMPLANT
GLOVE BIOGEL PI IND STRL 8 (GLOVE) ×2 IMPLANT
GLOVE BIOGEL PI INDICATOR 8 (GLOVE) ×4
GLOVE ORTHO TXT STRL SZ7.5 (GLOVE) ×6 IMPLANT
GOWN STRL REUS W/ TWL LRG LVL3 (GOWN DISPOSABLE) ×1 IMPLANT
GOWN STRL REUS W/ TWL XL LVL3 (GOWN DISPOSABLE) ×1 IMPLANT
GOWN STRL REUS W/TWL 2XL LVL3 (GOWN DISPOSABLE) ×3 IMPLANT
GOWN STRL REUS W/TWL LRG LVL3 (GOWN DISPOSABLE) ×3
GOWN STRL REUS W/TWL XL LVL3 (GOWN DISPOSABLE) ×3
HANDPIECE INTERPULSE COAX TIP (DISPOSABLE) ×3
IMMOBILIZER KNEE 22 UNIV (SOFTGOODS) ×3 IMPLANT
KIT BASIN OR (CUSTOM PROCEDURE TRAY) ×3 IMPLANT
KIT TURNOVER KIT B (KITS) ×3 IMPLANT
MANIFOLD NEPTUNE II (INSTRUMENTS) ×3 IMPLANT
MARKER SKIN DUAL TIP RULER LAB (MISCELLANEOUS) ×3 IMPLANT
NEEDLE 18GX1X1/2 (RX/OR ONLY) (NEEDLE) ×3 IMPLANT
NEEDLE HYPO 25GX1X1/2 BEV (NEEDLE) ×3 IMPLANT
NS IRRIG 1000ML POUR BTL (IV SOLUTION) ×3 IMPLANT
PACK TOTAL JOINT (CUSTOM PROCEDURE TRAY) ×3 IMPLANT
PAD ARMBOARD 7.5X6 YLW CONV (MISCELLANEOUS) ×6 IMPLANT
PAD CAST 4YDX4 CTTN HI CHSV (CAST SUPPLIES) ×1 IMPLANT
PADDING CAST ABS 6INX4YD NS (CAST SUPPLIES) ×2
PADDING CAST ABS COTTON 6X4 NS (CAST SUPPLIES) ×1 IMPLANT
PADDING CAST COTTON 4X4 STRL (CAST SUPPLIES) ×3
PADDING CAST COTTON 6X4 STRL (CAST SUPPLIES) ×3 IMPLANT
PIN FIX SIGMA LCS THRD HI (PIN) ×3 IMPLANT
PIN STEINMAN FIXATION KNEE (PIN) ×3 IMPLANT
SET HNDPC FAN SPRY TIP SCT (DISPOSABLE) ×1 IMPLANT
STAPLER VISISTAT 35W (STAPLE) IMPLANT
SUCTION FRAZIER HANDLE 10FR (MISCELLANEOUS) ×3
SUCTION TUBE FRAZIER 10FR DISP (MISCELLANEOUS) ×1 IMPLANT
SUT VIC AB 0 CT1 27 (SUTURE) ×3
SUT VIC AB 0 CT1 27XBRD ANBCTR (SUTURE) ×1 IMPLANT
SUT VIC AB 1 CTX 36 (SUTURE) ×6
SUT VIC AB 1 CTX36XBRD ANBCTR (SUTURE) ×2 IMPLANT
SUT VIC AB 2-0 CT1 27 (SUTURE) ×6
SUT VIC AB 2-0 CT1 TAPERPNT 27 (SUTURE) ×2 IMPLANT
SUT VIC AB 3-0 X1 27 (SUTURE) ×3 IMPLANT
SYR 50ML LL SCALE MARK (SYRINGE) ×3 IMPLANT
SYR CONTROL 10ML LL (SYRINGE) ×3 IMPLANT
TIBIAL BASE ROT PLAT SZ 3 KNEE (Knees) ×3 IMPLANT
TOWEL GREEN STERILE (TOWEL DISPOSABLE) ×3 IMPLANT
TOWEL GREEN STERILE FF (TOWEL DISPOSABLE) ×3 IMPLANT
TRAY CATH 16FR W/PLASTIC CATH (SET/KITS/TRAYS/PACK) IMPLANT

## 2020-05-13 NOTE — Anesthesia Postprocedure Evaluation (Signed)
Anesthesia Post Note  Patient: Julie Graves  Procedure(s) Performed: LEFT TOTAL KNEE ARTHROPLASTY (Left Knee)     Patient location during evaluation: PACU Anesthesia Type: Regional and Spinal Level of consciousness: awake and alert Pain management: pain level controlled Vital Signs Assessment: post-procedure vital signs reviewed and stable Respiratory status: spontaneous breathing, nonlabored ventilation, respiratory function stable and patient connected to nasal cannula oxygen Cardiovascular status: blood pressure returned to baseline and stable Postop Assessment: no apparent nausea or vomiting, spinal receding, no backache and no headache Anesthetic complications: no   No complications documented.  Last Vitals:  Vitals:   05/13/20 1400 05/13/20 1500  BP: (!) 147/68 140/65  Pulse: 64 70  Resp: 18 17  Temp: 36.5 C 36.6 C  SpO2: 97% 98%    Last Pain:  Vitals:   05/13/20 1500  TempSrc: Oral  PainSc:                  Dezman Granda L Terita Hejl

## 2020-05-13 NOTE — Progress Notes (Signed)
Patient voiced complaints of hx of urinary urgency and requested purewick to be placed. Education provided, pt acknowledge. MD made aware and ok.

## 2020-05-13 NOTE — Evaluation (Signed)
Physical Therapy Evaluation Patient Details Name: Julie Graves MRN: [id number] DOB: [birth year] Today's Date: 05/13/2020   History of Present Illness  Pt is a 79 y/o female s/p L TKA. PMH includes CAD, CHF, HTN, and DM.   Clinical Impression  Pt is s/p surgery above with deficits below. Pt requiring mod A to sit at EOB. Further mobility limited secondary to pain. Educated about knee precautions and supine HEP. Will continue to follow acutely.     Follow Up Recommendations Follow surgeon's recommendation for DC plan and follow-up therapies (pt reports she is going SNF )    Equipment Recommendations  None recommended by PT    Recommendations for Other Services       Precautions / Restrictions Precautions Precautions: Knee Precaution Booklet Issued: No Precaution Comments: Verbally reviewed knee precaution  Restrictions Weight Bearing Restrictions: Yes LLE Weight Bearing: Weight bearing as tolerated      Mobility  Bed Mobility Overal bed mobility: Needs Assistance Bed Mobility: Supine to Sit;Sit to Supine     Supine to sit: Mod assist Sit to supine: Min assist   General bed mobility comments: Mod A for trunk assist and LLE assist to come to sitting. Pt with increased pain, so further mobility deferred. Min A for LLE assist to return to supine.     Transfers                    Ambulation/Gait                Stairs            Wheelchair Mobility    Modified Rankin (Stroke Patients Only)       Balance Overall balance assessment: Needs assistance Sitting-balance support: No upper extremity supported;Feet supported Sitting balance-Leahy Scale: Fair                                       Pertinent Vitals/Pain Pain Assessment: 0-10 Pain Score: 9  Pain Location: L knee  Pain Descriptors / Indicators: Aching;Operative site guarding Pain Intervention(s): Limited activity within patient's tolerance;Monitored during  session;Repositioned    Home Living Family/patient expects to be discharged to:: Skilled nursing facility Living Arrangements: Spouse/significant other                    Prior Function Level of Independence: Independent with assistive device(s)         Comments: Used RW vs cane for mobility      Hand Dominance        Extremity/Trunk Assessment   Upper Extremity Assessment Upper Extremity Assessment: Generalized weakness    Lower Extremity Assessment Lower Extremity Assessment: LLE deficits/detail LLE Deficits / Details: Deficits consistent with post op pain and weakness.     Cervical / Trunk Assessment Cervical / Trunk Assessment: Normal  Communication   Communication: No difficulties  Cognition Arousal/Alertness: Awake/alert Behavior During Therapy: WFL for tasks assessed/performed Overall Cognitive Status: Within Functional Limits for tasks assessed                                        General Comments      Exercises Total Joint Exercises Ankle Circles/Pumps: AROM;Both;20 reps Quad Sets: AROM;Left;5 reps   Assessment/Plan    PT Assessment Patient needs continued PT services  PT  Problem List Decreased strength;Decreased balance;Decreased mobility;Decreased activity tolerance;Decreased range of motion;Decreased coordination;Decreased knowledge of use of DME;Decreased knowledge of precautions;Pain       PT Treatment Interventions DME instruction;Gait training;Therapeutic activities;Functional mobility training;Therapeutic exercise;Balance training;Patient/family education    PT Goals (Current goals can be found in the Care Plan section)  Acute Rehab PT Goals Patient Stated Goal: to decrease pain  PT Goal Formulation: With patient Time For Goal Achievement: 05/27/20 Potential to Achieve Goals: Good    Frequency 7X/week   Barriers to discharge        Co-evaluation               AM-PAC PT "6 Clicks" Mobility   Outcome Measure Help needed turning from your back to your side while in a flat bed without using bedrails?: A Lot Help needed moving from lying on your back to sitting on the side of a flat bed without using bedrails?: A Lot Help needed moving to and from a bed to a chair (including a wheelchair)?: A Lot Help needed standing up from a chair using your arms (e.g., wheelchair or bedside chair)?: A Lot Help needed to walk in hospital room?: A Lot Help needed climbing 3-5 steps with a railing? : Total 6 Click Score: 11    End of Session   Activity Tolerance: Patient limited by pain Patient left: in bed;with call bell/phone within reach;with nursing/sitter in room Nurse Communication: Mobility status;Patient requests pain meds PT Visit Diagnosis: Unsteadiness on feet (R26.81);Muscle weakness (generalized) (M62.81);Difficulty in walking, not elsewhere classified (R26.2)    Time: 9767-3419 PT Time Calculation (min) (ACUTE ONLY): 16 min   Charges:   PT Evaluation $PT Eval Low Complexity: 1 Low          Lou Miner, DPT  Acute Rehabilitation Services  Pager: [phone] Office: [phone]   Rudean Hitt 05/13/2020, 4:38 PM

## 2020-05-13 NOTE — Progress Notes (Signed)
Patient arrived to unit a/o/vx4, oriented to room. CPM increased to 80 degrees per patient. Patient denies pain at this time. Will continue to monitor.

## 2020-05-13 NOTE — Anesthesia Procedure Notes (Signed)
Anesthesia Regional Block: Adductor canal block   Pre-Anesthetic Checklist: ,, timeout performed, Correct Patient, Correct Site, Correct Laterality, Correct Procedure, Correct Position, site marked, Risks and benefits discussed,  Surgical consent,  Pre-op evaluation,  At surgeon's request and post-op pain management  Laterality: Left  Prep: Maximum Sterile Barrier Precautions used, chloraprep       Needles:  Injection technique: Single-shot  Needle Type: Echogenic Stimulator Needle     Needle Length: 9cm  Needle Gauge: 22     Additional Needles:   Procedures:,,,, ultrasound used (permanent image in chart),,,,  Narrative:  Start time: 05/13/2020 9:33 AM End time: 05/13/2020 9:43 AM Injection made incrementally with aspirations every 5 mL.  Performed by: Personally  Anesthesiologist: Freddrick March, MD  Additional Notes: Monitors applied. No increased pain on injection. No increased resistance to injection. Injection made in 5cc increments. Good needle visualization. Patient tolerated procedure well.

## 2020-05-13 NOTE — Progress Notes (Signed)
Orthopedic Tech Progress Note Patient Details:  Julie Graves Mar 28, 1941 [id number]  CPM Left Knee CPM Left Knee: On Left Knee Flexion (Degrees): 0 Left Knee Extension (Degrees): 70  Post Interventions Patient Tolerated: Well  [name] 05/13/2020, 4:54 PM

## 2020-05-13 NOTE — Anesthesia Procedure Notes (Signed)
Spinal  Patient location during procedure: OR Start time: 05/13/2020 10:00 AM End time: 05/13/2020 10:10 AM Staffing Performed: anesthesiologist  Anesthesiologist: Freddrick March, MD Preanesthetic Checklist Completed: patient identified, IV checked, risks and benefits discussed, surgical consent, monitors and equipment checked, pre-op evaluation and timeout performed Spinal Block Patient position: sitting Prep: DuraPrep and site prepped and draped Patient monitoring: cardiac monitor, continuous pulse ox and blood pressure Approach: midline Location: L3-4 Injection technique: single-shot Needle Needle type: Pencan  Needle gauge: 24 G Needle length: 9 cm Assessment Sensory level: T6 Additional Notes Functioning IV was confirmed and monitors were applied. Sterile prep and drape, including hand hygiene and sterile gloves were used. The patient was positioned and the spine was prepped. The skin was anesthetized with lidocaine.  Free flow of clear CSF was obtained prior to injecting local anesthetic into the CSF.  The spinal needle aspirated freely following injection.  The needle was carefully withdrawn.  The patient tolerated the procedure well.

## 2020-05-13 NOTE — Op Note (Signed)
Preop diagnosis: Left knee primary osteoarthritis  Postop diagnosis: Same  Procedure: Left total knee arthroplasty.  Surgeon: Rodell Perna, MD  Assistant Benjiman Core, PA-C medically necessary and present for the entire procedure  Anesthesia spinal plus Exparel Marcaine and preoperative abductor block.  Tourniquet 47 minutes x 350.  Complications: None  Implants Depuy size 3 Attune femur and tibia, 35mm rotating platform.  32 mm 3 peg dome patella.  Procedure: After induction of spinal anesthesia standard prepping and draping proximal thigh tourniquet lateral post heel bump with ChloraPrep due to patient's history of iodine allergy standard drapes were applied for total knee arthroplasty.  Purple skin marker was used in the midline.  Betadine Steri-Drape applied.  Timeout procedure was completed.  Vancomycin was given to the patient's allergies.  Midline incision was made medial parapatellar incision was made patella was everted 10 mm resected.  Intramedullary hole made in the femur.  Chamfer cuts made in the femur.  She is.  Size 3 tibia.  An additional 2 mm were taken off the tibia and femur to get the 5 mm spacer block which fit nicely.  Posterior spurs removed with a three-quarter curved osteotome.  There was tricompartment degenerative arthritis.  Procedure inserted.  Full extension good collateral balance in flexion extension.  Pulse lavage mixing of the cement and tibia was cemented followed by femur.  Permanent static surgery today 39mm rotating platform was inserted followed by patella was held with a self-retaining clamp.  Exparel with Marcaine was injected while tourniquet was still and the cement was hardening.  Once cement was hardened all excessive cement had been removed tourniquet deflated hemostasis obtained standard layered closure with skin staples  1.  Vicryl interested in a deep fascia subcutaneous tissue.  Patient tolerated procedure well transfer the care room in stable  condition.

## 2020-05-13 NOTE — Progress Notes (Signed)
Orthopedic Tech Progress Note Patient Details:  Julie Graves 10-Nov-1940 [id number]  Patient ID: Julie Graves, female   DOB: 07-26-1940, 79 y.o.   MRN: [id number] Checked on cpm.  Julie Graves 05/13/2020, 10:42 PM

## 2020-05-13 NOTE — Transfer of Care (Signed)
Immediate Anesthesia Transfer of Care Note  Patient: Julie Graves  Procedure(s) Performed: LEFT TOTAL KNEE ARTHROPLASTY (Left Knee)  Patient Location: PACU  Anesthesia Type:MAC and Spinal  Level of Consciousness: awake and alert   Airway & Oxygen Therapy: Patient Spontanous Breathing  Post-op Assessment: Report given to RN and Post -op Vital signs reviewed and stable  Post vital signs: Reviewed and stable  Last Vitals:  Vitals Value Taken Time  BP 102/86 05/13/20 1203  Temp    Pulse 57 05/13/20 1204  Resp 20 05/13/20 1204  SpO2 95 % 05/13/20 1204  Vitals shown include unvalidated device data.  Last Pain:  Vitals:   05/13/20 0918  TempSrc:   PainSc: 7          Complications: No complications documented.

## 2020-05-14 DIAGNOSIS — M1712 Unilateral primary osteoarthritis, left knee: Secondary | ICD-10-CM | POA: Diagnosis not present

## 2020-05-14 LAB — BASIC METABOLIC PANEL
Anion gap: 5 (ref 5–15)
BUN: 15 mg/dL (ref 8–23)
CO2: 24 mmol/L (ref 22–32)
Calcium: 8.5 mg/dL — ABNORMAL LOW (ref 8.9–10.3)
Chloride: 109 mmol/L (ref 98–111)
Creatinine, Ser: 0.71 mg/dL (ref 0.44–1.00)
GFR, Estimated: >60 mL/min (ref >60)
Glucose, Bld: 149 mg/dL — ABNORMAL HIGH (ref 70–99)
Potassium: 3.8 mmol/L (ref 3.5–5.1)
Sodium: 138 mmol/L (ref 135–145)

## 2020-05-14 LAB — GLUCOSE, CAPILLARY
Glucose-Capillary: 119 mg/dL — ABNORMAL HIGH (ref 70–99)
Glucose-Capillary: 106 mg/dL — ABNORMAL HIGH (ref 70–99)
Glucose-Capillary: 111 mg/dL — ABNORMAL HIGH (ref 70–99)
Glucose-Capillary: 102 mg/dL — ABNORMAL HIGH (ref 70–99)

## 2020-05-14 LAB — CBC
HCT: 33.3 % — ABNORMAL LOW (ref 36.0–46.0)
Hemoglobin: 10.8 g/dL — ABNORMAL LOW (ref 12.0–15.0)
MCH: 31 pg (ref 26.0–34.0)
MCHC: 32.4 g/dL (ref 30.0–36.0)
MCV: 95.7 fL (ref 80.0–100.0)
Platelets: 206 10*3/uL (ref 150–400)
RBC: 3.48 MIL/uL — ABNORMAL LOW (ref 3.87–5.11)
RDW: 12.9 % (ref 11.5–15.5)
WBC: 13 10*3/uL — ABNORMAL HIGH (ref 4.0–10.5)
nRBC: 0 % (ref 0.0–0.2)

## 2020-05-14 NOTE — Progress Notes (Signed)
Patient ID: Julie Graves, female   DOB: June 03, 1941, 79 y.o.   MRN: [id number] Patient is status post left total knee arthroplasty.  She is progressing slowly with therapy and anticipate discharge to skilled nursing.

## 2020-05-14 NOTE — Progress Notes (Signed)
Return to cpm- attempted to increase to 80 degree flexion-she didn't want to go up at this time- tolerated for 2 hrs.

## 2020-05-14 NOTE — Care Management CC44 (Signed)
Condition Code 44 Documentation Completed  Patient Details  Name: KIMORI TARTAGLIA MRN: [id number] Date of Birth: May 31, 1941   Condition Code 44 given:  Yes Patient signature on Condition Code 44 notice:  Yes Documentation of 2 MD's agreement:  Yes Code 44 added to claim:  Yes    Sharin Mons, RN 05/14/2020, 4:39 PM

## 2020-05-14 NOTE — Progress Notes (Signed)
Physical Therapy Treatment Patient Details Name: Julie Graves MRN: [id number] DOB: 11/22/1940 Today's Date: 05/14/2020    History of Present Illness Pt is a 79 y/o female s/p L TKA. PMH includes CAD, CHF, HTN, and DM.     PT Comments    Pt eager to move but also limited due to pain.  Pt required assistance to mobilize with min assistance overall.  Pt continues to benefit from skilled rehab in a post acute setting to maximize functional gains before returning home.  Pt seated in recliner post session with cold packs applied.    Follow Up Recommendations  Follow surgeon's recommendation for DC plan and follow-up therapies     Equipment Recommendations  None recommended by PT    Recommendations for Other Services       Precautions / Restrictions Precautions Precautions: Knee Precaution Booklet Issued: No Precaution Comments: Verbally reviewed knee precaution  Restrictions Weight Bearing Restrictions: Yes LLE Weight Bearing: Weight bearing as tolerated    Mobility  Bed Mobility Overal bed mobility: Needs Assistance Bed Mobility: Supine to Sit;Sit to Supine     Supine to sit: Min assist Sit to supine: Min assist   General bed mobility comments: Min assistance to move to edge of bed advancing.  LLE and trunk into sitting.  Heavy use of rails during therapy.  Transfers Overall transfer level: Needs assistance Equipment used: Rolling walker (2 wheeled) Transfers: Sit to/from Stand Sit to Stand: Min guard;From elevated surface         General transfer comment: Pt pushing through RW hand grips from higher seated surface.  Cues for hand placement to return to seated surface.  Ambulation/Gait Ambulation/Gait assistance: Min assist Gait Distance (Feet): 6 Feet Assistive device: Rolling walker (2 wheeled) Gait Pattern/deviations: Step-to pattern;Trunk flexed;Antalgic;Decreased stride length;Decreased dorsiflexion - left;Decreased stance time - left     General  Gait Details: Cues for keeping hands on RW, head up and trunk extended.  Antalgic pattern from surface to surface.   Stairs             Wheelchair Mobility    Modified Rankin (Stroke Patients Only)       Balance Overall balance assessment: Needs assistance Sitting-balance support: No upper extremity supported;Feet supported Sitting balance-Leahy Scale: Fair       Standing balance-Leahy Scale: Poor                              Cognition Arousal/Alertness: Awake/alert Behavior During Therapy: WFL for tasks assessed/performed Overall Cognitive Status: Within Functional Limits for tasks assessed                                        Exercises Total Joint Exercises Ankle Circles/Pumps: AROM;Both;20 reps;Supine Quad Sets: AROM;Left;10 reps;Supine Heel Slides: AAROM;Left;10 reps;Supine Hip ABduction/ADduction: AAROM;Left;10 reps;Supine Straight Leg Raises: AAROM;Left;10 reps;Supine Goniometric ROM: 7-65 L knee.    General Comments        Pertinent Vitals/Pain Pain Assessment: 0-10 Pain Location: L knee  Pain Descriptors / Indicators: Aching;Operative site guarding Pain Intervention(s): Monitored during session;Repositioned    Home Living                      Prior Function            PT Goals (current goals can now be found in the care  plan section) Acute Rehab PT Goals Patient Stated Goal: to decrease pain  Potential to Achieve Goals: Good Progress towards PT goals: Progressing toward goals    Frequency    7X/week      PT Plan Current plan remains appropriate    Co-evaluation              AM-PAC PT "6 Clicks" Mobility   Outcome Measure  Help needed turning from your back to your side while in a flat bed without using bedrails?: A Lot Help needed moving from lying on your back to sitting on the side of a flat bed without using bedrails?: A Lot Help needed moving to and from a bed to a chair  (including a wheelchair)?: A Lot Help needed standing up from a chair using your arms (e.g., wheelchair or bedside chair)?: A Lot Help needed to walk in hospital room?: A Lot Help needed climbing 3-5 steps with a railing? : Total 6 Click Score: 11    End of Session Equipment Utilized During Treatment: Gait belt Activity Tolerance: Patient limited by pain Patient left: with call bell/phone within reach;in chair;with chair alarm set Nurse Communication: Mobility status (purewick application.) PT Visit Diagnosis: Unsteadiness on feet (R26.81);Muscle weakness (generalized) (M62.81);Difficulty in walking, not elsewhere classified (R26.2)     Time: 7543-6067 PT Time Calculation (min) (ACUTE ONLY): 25 min  Charges:  $Therapeutic Exercise: 8-22 mins $Therapeutic Activity: 8-22 mins                     Julie Graves , PTA Acute Rehabilitation Services Pager [phone] Office [phone]     Julie Graves 05/14/2020, 12:06 PM

## 2020-05-14 NOTE — Care Management Obs Status (Signed)
[facility] NOTIFICATION   Patient Details  Name: COREEN SHIPPEE MRN: [id number] Date of Birth: Jul 17, 1940   Medicare Observation Status Notification Given:  Yes (Pt declined to sign)    Sharin Mons, RN 05/14/2020, 4:39 PM

## 2020-05-15 DIAGNOSIS — M1712 Unilateral primary osteoarthritis, left knee: Secondary | ICD-10-CM | POA: Diagnosis not present

## 2020-05-15 LAB — GLUCOSE, CAPILLARY
Glucose-Capillary: 83 mg/dL (ref 70–99)
Glucose-Capillary: 114 mg/dL — ABNORMAL HIGH (ref 70–99)
Glucose-Capillary: 97 mg/dL (ref 70–99)
Glucose-Capillary: 124 mg/dL — ABNORMAL HIGH (ref 70–99)

## 2020-05-15 LAB — CBC
HCT: 35.3 % — ABNORMAL LOW (ref 36.0–46.0)
Hemoglobin: 11.4 g/dL — ABNORMAL LOW (ref 12.0–15.0)
MCH: 30.5 pg (ref 26.0–34.0)
MCHC: 32.3 g/dL (ref 30.0–36.0)
MCV: 94.4 fL (ref 80.0–100.0)
Platelets: 223 10*3/uL (ref 150–400)
RBC: 3.74 MIL/uL — ABNORMAL LOW (ref 3.87–5.11)
RDW: 13 % (ref 11.5–15.5)
WBC: 12.3 10*3/uL — ABNORMAL HIGH (ref 4.0–10.5)
nRBC: 0 % (ref 0.0–0.2)

## 2020-05-15 MED ORDER — MAGNESIUM CITRATE PO SOLN
1.0000 | Freq: Once | ORAL | Status: AC
  Administered 2020-05-15: 1 via ORAL
  Filled 2020-05-15: qty 296

## 2020-05-15 NOTE — Progress Notes (Signed)
Patient ID: Julie Graves, female   DOB: 1940-09-09, 79 y.o.   MRN: [id number] Patient is status post total knee arthroplasty she is slow with therapy will need discharge to skilled nursing anticipate discharge to skilled nursing on Monday or Tuesday.

## 2020-05-15 NOTE — Progress Notes (Signed)
PT Cancellation Note  Patient Details Name: Julie Graves MRN: [id number] DOB: Mar 31, 1941   Cancelled Treatment:    Reason Eval/Treat Not Completed: Other (comment)   Politely declining due to pain;  Notified RN pt requesting pain meds, and will make every effort for PT session later this afternoon;   Roney Marion, [place] Pager [phone] Office [phone]    Colletta [place] 05/15/2020, 2:26 PM

## 2020-05-15 NOTE — Progress Notes (Signed)
Physical Therapy Treatment Patient Details Name: Julie Graves MRN: [id number] DOB: [birth year] Today's Date: 05/15/2020    History of Present Illness Pt is a 79 y/o female s/p L TKA. PMH includes CAD, CHF, HTN, and DM.     PT Comments    Continuing work on functional mobility and activity tolerance;  Session focused on functional mobility, and gently progressing ambulation; Able to walk a few steps further today; tears in eyes from pain, but still participating well   Follow Up Recommendations  Follow surgeon's recommendation for DC plan and follow-up therapies     Equipment Recommendations  None recommended by PT    Recommendations for Other Services       Precautions / Restrictions Precautions Precautions: Knee Precaution Comments: Verbally reviewed knee precaution  Restrictions LLE Weight Bearing: Weight bearing as tolerated    Mobility  Bed Mobility Overal bed mobility: Needs Assistance Bed Mobility: Supine to Sit     Supine to sit: Min assist     General bed mobility comments: Min assistance and cues to use RLE to half-bridge to EOB; then handheld assist to pull to sit  Transfers Overall transfer level: Needs assistance Equipment used: Rolling walker (2 wheeled) Transfers: Sit to/from Stand Sit to Stand: Min assist         General transfer comment: Pt pushing through RW hand grips from higher seated surface.  Cues for hand placement to return to seated surface.  Ambulation/Gait Ambulation/Gait assistance: Min assist Gait Distance (Feet): 8 Feet Assistive device: Rolling walker (2 wheeled) Gait Pattern/deviations: Step-to pattern;Trunk flexed;Antalgic;Decreased stride length;Decreased dorsiflexion - left;Decreased stance time - left     General Gait Details: Cues for keeping hands on RW, head up and trunk extended.     Stairs             Wheelchair Mobility    Modified Rankin (Stroke Patients Only)       Balance     Sitting  balance-Leahy Scale: Fair       Standing balance-Leahy Scale: Poor                              Cognition Arousal/Alertness: Awake/alert Behavior During Therapy: WFL for tasks assessed/performed Overall Cognitive Status: Within Functional Limits for tasks assessed                                        Exercises      General Comments General comments (skin integrity, edema, etc.): Obtained Bari RW and sized it for optimal fit      Pertinent Vitals/Pain Pain Assessment: Faces Faces Pain Scale: Hurts worst Pain Location: L knee  Pain Descriptors / Indicators: Aching;Operative site guarding Pain Intervention(s): Premedicated before session    Home Living                      Prior Function            PT Goals (current goals can now be found in the care plan section) Acute Rehab PT Goals Patient Stated Goal: to decrease pain  PT Goal Formulation: With patient Time For Goal Achievement: 05/27/20 Potential to Achieve Goals: Good Progress towards PT goals: Progressing toward goals (slowly)    Frequency    7X/week      PT Plan Current plan remains appropriate  Co-evaluation              AM-PAC PT "6 Clicks" Mobility   Outcome Measure  Help needed turning from your back to your side while in a flat bed without using bedrails?: A Lot Help needed moving from lying on your back to sitting on the side of a flat bed without using bedrails?: A Lot Help needed moving to and from a bed to a chair (including a wheelchair)?: A Lot Help needed standing up from a chair using your arms (e.g., wheelchair or bedside chair)?: A Lot Help needed to walk in hospital room?: A Lot Help needed climbing 3-5 steps with a railing? : Total 6 Click Score: 11    End of Session Equipment Utilized During Treatment: Gait belt Activity Tolerance: Patient tolerated treatment well     PT Visit Diagnosis: Unsteadiness on feet (R26.81);Muscle  weakness (generalized) (M62.81);Difficulty in walking, not elsewhere classified (R26.2)     Time: 2162-4469 PT Time Calculation (min) (ACUTE ONLY): 28 min  Charges:  $Gait Training: 8-22 mins $Therapeutic Activity: 8-22 mins                     Roney Marion, PT  Acute Rehabilitation Services Pager [phone] Office [place] 05/15/2020, 5:59 PM

## 2020-05-16 ENCOUNTER — Encounter (HOSPITAL_COMMUNITY): Payer: Self-pay | Admitting: Orthopaedic Surgery

## 2020-05-16 DIAGNOSIS — M1712 Unilateral primary osteoarthritis, left knee: Secondary | ICD-10-CM | POA: Diagnosis not present

## 2020-05-16 LAB — CBC
HCT: 34.3 % — ABNORMAL LOW (ref 36.0–46.0)
Hemoglobin: 11 g/dL — ABNORMAL LOW (ref 12.0–15.0)
MCH: 30.1 pg (ref 26.0–34.0)
MCHC: 32.1 g/dL (ref 30.0–36.0)
MCV: 94 fL (ref 80.0–100.0)
Platelets: 218 10*3/uL (ref 150–400)
RBC: 3.65 MIL/uL — ABNORMAL LOW (ref 3.87–5.11)
RDW: 13.1 % (ref 11.5–15.5)
WBC: 10.3 10*3/uL (ref 4.0–10.5)
nRBC: 0 % (ref 0.0–0.2)

## 2020-05-16 LAB — GLUCOSE, CAPILLARY
Glucose-Capillary: 139 mg/dL — ABNORMAL HIGH (ref 70–99)
Glucose-Capillary: 156 mg/dL — ABNORMAL HIGH (ref 70–99)
Glucose-Capillary: 82 mg/dL (ref 70–99)
Glucose-Capillary: 138 mg/dL — ABNORMAL HIGH (ref 70–99)

## 2020-05-16 MED ORDER — BISACODYL 10 MG RE SUPP: 10.0000 mg | Freq: Once | RECTAL | Status: DC

## 2020-05-16 NOTE — Progress Notes (Signed)
Physical Therapy Treatment Patient Details Name: Julie Graves MRN: [id number] DOB: 14-Oct-1940 Today's Date: 05/16/2020    History of Present Illness Pt is a 79 y/o female s/p L TKA. PMH includes CAD, CHF, HTN, and DM.     PT Comments    Continuing work on functional mobility and activity tolerance;  Able to participate in a full complement of TKA therex, and walk out of her room into the hallway today; L knee still painful, but nice and stable in stance; She is concerned about moving her bowels -- provided encouragement that if she can walk in the hallways, she can get to the [facility]; I'm hopeful she is turning a corner, and anticipate good progress at post-acute rehab  Follow Up Recommendations  Follow surgeon's recommendation for DC plan and follow-up therapies     Equipment Recommendations  None recommended by PT    Recommendations for Other Services       Precautions / Restrictions Precautions Precautions: Knee Precaution Booklet Issued: Yes (comment) Precaution Comments: Verbally reviewed knee precaution  Restrictions Weight Bearing Restrictions: Yes LLE Weight Bearing: Weight bearing as tolerated    Mobility  Bed Mobility Overal bed mobility: Needs Assistance Bed Mobility: Supine to Sit     Supine to sit: Min guard     General bed mobility comments: Cues for technique, minguard for safety; Slow moving, and with short, inefficient scoots to EOB, Min assist and use of bed pad to square off hips at EOB  Transfers Overall transfer level: Needs assistance Equipment used: Rolling walker (2 wheeled) Transfers: Sit to/from Stand Sit to Stand: Min assist;Min guard         General transfer comment: Min assist to steady RW when standing from EOB; minguard standing from recliner and BSC with use of bilateral armrests to push up   Ambulation/Gait Ambulation/Gait assistance: Min guard (and chair pulled) Gait Distance (Feet): 35 Feet Assistive device: Rolling walker  (2 wheeled) Gait Pattern/deviations: Step-to pattern;Antalgic     General Gait Details: Cues for sequence, and to relax shoulders as much as possible; L knee still quite painful in stance, but no instability or buckling   Stairs             Wheelchair Mobility    Modified Rankin (Stroke Patients Only)       Balance     Sitting balance-Leahy Scale: Fair       Standing balance-Leahy Scale: Poor (but improving)                              Cognition Arousal/Alertness: Awake/alert Behavior During Therapy: WFL for tasks assessed/performed Overall Cognitive Status: Within Functional Limits for tasks assessed                                        Exercises Total Joint Exercises Ankle Circles/Pumps: AROM;Both;20 reps Quad Sets: AROM;Left;10 reps;Supine Short Arc Quad: AAROM;Left;10 reps;Other (comment) (noting some hamstring co-contraction) Heel Slides: AAROM;Left;10 reps;Supine Straight Leg Raises: AAROM;Left;10 reps;Supine Goniometric ROM: approx 3-65 deg    General Comments        Pertinent Vitals/Pain Pain Assessment: Faces Faces Pain Scale: Hurts even more Pain Location: L knee  Pain Descriptors / Indicators: Aching;Operative site guarding Pain Intervention(s): Monitored during session;Premedicated before session    Home Living  Prior Function            PT Goals (current goals can now be found in the care plan section) Acute Rehab PT Goals Patient Stated Goal: to decrease pain; walk again PT Goal Formulation: With patient Time For Goal Achievement: 05/27/20 Potential to Achieve Goals: Good Progress towards PT goals: Progressing toward goals    Frequency    7X/week      PT Plan Current plan remains appropriate    Co-evaluation              AM-PAC PT "6 Clicks" Mobility   Outcome Measure  Help needed turning from your back to your side while in a flat bed without using  bedrails?: A Little Help needed moving from lying on your back to sitting on the side of a flat bed without using bedrails?: A Little Help needed moving to and from a bed to a chair (including a wheelchair)?: A Little Help needed standing up from a chair using your arms (e.g., wheelchair or bedside chair)?: A Lot Help needed to walk in hospital room?: A Lot Help needed climbing 3-5 steps with a railing? : Total 6 Click Score: 14    End of Session Equipment Utilized During Treatment: Gait belt Activity Tolerance: Patient tolerated treatment well Patient left: with call bell/phone within reach;in chair;with chair alarm set Nurse Communication: Mobility status;Other (comment) (and pt is on [facility] with call bell within reach) PT Visit Diagnosis: Unsteadiness on feet (R26.81);Muscle weakness (generalized) (M62.81);Difficulty in walking, not elsewhere classified (R26.2)     Time: 4627-0350 PT Time Calculation (min) (ACUTE ONLY): 46 min  Charges:  $Gait Training: 8-22 mins $Therapeutic Exercise: 8-22 mins $Therapeutic Activity: 8-22 mins                     Roney Marion, PT  Acute Rehabilitation Services Pager [phone] Office [place] 05/16/2020, 10:38 AM

## 2020-05-16 NOTE — Progress Notes (Signed)
Orthopedic Tech Progress Note Patient Details:  Julie Graves [birth year] [id number]  Patient ID: [name], female   DOB: [birth year], 79 y.o.   MRN: [id number] Cpm replaced  Karolee Stamps 05/16/2020, 7:03 AM

## 2020-05-16 NOTE — Progress Notes (Signed)
CPM switched out for different one and adjusted by ortho tech- has been encouraged to pre medicate for pain- has declined this am

## 2020-05-16 NOTE — NC FL2 (Signed)
[place] MEDICAID FL2 LEVEL OF CARE SCREENING TOOL     IDENTIFICATION  Patient Name: Julie Graves Birthdate: 02-Jul-1940 Sex: female Admission Date (Current Location): 05/13/2020  [facility] and [place] Number:  Herbalist and Address:  The . [facility], [place] [address] [place], [place] [address]      Provider Number: [id number]  Attending Physician Name and Address:  Marybelle Killings, MD  Relative Name and Phone Number:       Current Level of Care: Hospital Recommended Level of Care: [place] Prior Approval Number:    Date Approved/Denied:   PASRR Number: [id number]  Discharge Plan: SNF    Current Diagnoses: Patient Active Problem List   Diagnosis Date Noted  . Arthritis of left knee 05/13/2020  . S/P total knee arthroplasty, left 05/13/2020  . Unilateral primary osteoarthritis, left knee 03/31/2020  . History of total knee arthroplasty, right 03/31/2020  . Precordial pain   . Angina pectoris ([place]) 08/13/2017  . Mixed dyslipidemia 08/13/2017  . CAD (coronary artery disease), native coronary artery 08/13/2017  . Mild episode of recurrent major depressive disorder ([place]) 11/03/2013  . Urge incontinence 04/02/2013  . Spinal stenosis, lumbar region, without neurogenic claudication 10/27/2012  . Spinal stenosis of lumbar region 10/01/2012  . Allergic rhinitis 09/26/2011  . Dizziness 01/23/2011  . Dyspnea 01/23/2011  . Type II diabetes mellitus ([place]) 06/27/2010  . Morbid obesity ([place]) 06/27/2010  . Impaired glucose tolerance 06/27/2010  . Disorder of bone and cartilage 06/22/2010  . Primary cardiomyopathy ([place]) 03/23/2010  . HYPERLIPIDEMIA-MIXED 03/07/2010  . Essential hypertension 03/07/2010  . CARDIOMEGALY 11/30/2009  . OTHER DYSPNEA AND RESPIRATORY ABNORMALITIES 11/30/2009  . AORTIC REGURGITATION 09/28/2009  . Chronic diastolic heart failure ([place]) 09/28/2009  . Aortic regurgitation 09/28/2009  . 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 Osteoarthrosis involving multiple sites 10/26/2008    Orientation RESPIRATION BLADDER Height & Weight     Self, Situation, Time, Place  Normal Continent Weight: 99.3 kg Height:  5' (152.4 cm)  BEHAVIORAL SYMPTOMS/MOOD NEUROLOGICAL BOWEL NUTRITION STATUS      Continent Diet (refer to d/c summary)  AMBULATORY STATUS COMMUNICATION OF NEEDS Skin   Extensive Assist Verbally Surgical wounds (s/p L TKA, 05/13/2020)                       Personal Care Assistance Level of Assistance  Bathing, Feeding, Dressing Bathing Assistance: Maximum assistance Feeding assistance: Independent Dressing Assistance: Maximum assistance     Functional Limitations Info  Hearing, Speech, Sight Sight Info: Adequate Hearing Info: Adequate Speech Info: Adequate    SPECIAL CARE FACTORS FREQUENCY  OT (By licensed OT), PT (By licensed PT)     PT Frequency: 5x/week , evaluate and treat OT Frequency: 5x/week , evaluate and treat            Contractures Contractures Info: Not present    Additional Factors Info  Code Status, Allergies Code Status Info: Full code Allergies Info: Iodine, Shellfish Allergy, Topiramate, Metamucil Psyllium, Lisinopril, Penicillins, Tramadol           Current Medications (05/16/2020):  This is the current hospital active medication list Current Facility-Administered Medications  Medication Dose Route Frequency Provider Last Rate Last Admin  . 0.9 %  sodium chloride infusion   Intravenous Continuous Lanae Crumbly, PA-C 85 mL/hr at 05/13/20 1420 New Bag at 05/13/20 1420  . acetaminophen (TYLENOL) tablet 325-650 mg  325-650 mg Oral Q6H PRN Benjiman Core  M, PA-C   650 mg at 05/15/20 0825  . aspirin EC tablet 325 mg  325 mg Oral Q breakfast Lanae Crumbly, PA-C   325 mg at 05/16/20 8938  . bisacodyl (DULCOLAX) suppository 10 mg  10 mg Rectal Once Lanae Crumbly, PA-C      . bupivacaine liposome (EXPAREL) 1.3 % injection 266 mg  20 mL Infiltration  Once Marybelle Killings, MD      . carvedilol (COREG) tablet 6.25 mg  6.25 mg Oral BID Benjiman Core M, PA-C   6.25 mg at 05/16/20 0900  . cholecalciferol (VITAMIN D3) tablet 2,000 Units  2,000 Units Oral Daily Lanae Crumbly, PA-C   2,000 Units at 05/16/20 1017  . docusate sodium (COLACE) capsule 100 mg  100 mg Oral BID Lanae Crumbly, PA-C   100 mg at 05/16/20 0900  . empagliflozin (JARDIANCE) tablet 10 mg  10 mg Oral QAC breakfast Lanae Crumbly, PA-C   10 mg at 05/16/20 0900  . fluticasone (FLONASE) 50 MCG/ACT nasal spray 1 spray  1 spray Each Nare Daily PRN Lanae Crumbly, PA-C      . furosemide (LASIX) tablet 40 mg  40 mg Oral Daily PRN Lanae Crumbly, PA-C      . HYDROmorphone (DILAUDID) injection 0.5 mg  0.5 mg Intravenous Q4H PRN Lanae Crumbly, PA-C   0.5 mg at 05/14/20 0342  . insulin aspart (novoLOG) injection 0-20 Units  0-20 Units Subcutaneous TID WC Lanae Crumbly, PA-C   4 Units at 05/16/20 1153  . insulin aspart (novoLOG) injection 0-5 Units  0-5 Units Subcutaneous QHS Benjiman Core M, PA-C      . lactated ringers infusion   Intravenous Continuous Freddrick March, MD 10 mL/hr at 05/13/20 5102 New Bag at 05/13/20 1143  . losartan (COZAAR) tablet 100 mg  100 mg Oral Daily Benjiman Core M, PA-C   100 mg at 05/16/20 0900  . menthol-cetylpyridinium (CEPACOL) lozenge 3 mg  1 lozenge Oral PRN Lanae Crumbly, PA-C       Or  . phenol (CHLORASEPTIC) mouth spray 1 spray  1 spray Mouth/Throat PRN Lanae Crumbly, PA-C      . metFORMIN (GLUCOPHAGE-XR) 24 hr tablet 500 mg  500 mg Oral Q breakfast Lanae Crumbly, PA-C   500 mg at 05/16/20 5852  . methocarbamol (ROBAXIN) tablet 500 mg  500 mg Oral Q6H PRN Lanae Crumbly, PA-C   500 mg at 05/15/20 2126   Or  . methocarbamol (ROBAXIN) 500 mg in dextrose 5 % 50 mL IVPB  500 mg Intravenous Q6H PRN Lanae Crumbly, PA-C      . metoCLOPramide (REGLAN) tablet 5-10 mg  5-10 mg Oral Q8H PRN Lanae Crumbly, PA-C       Or  . metoCLOPramide (REGLAN) injection  5-10 mg  5-10 mg Intravenous Q8H PRN Benjiman Core M, PA-C      . nitroGLYCERIN (NITROSTAT) SL tablet 0.4 mg  0.4 mg Sublingual Q5 min PRN Lanae Crumbly, PA-C      . ondansetron [facility]) tablet 4 mg  4 mg Oral Q6H PRN Lanae Crumbly, PA-C       Or  . ondansetron [facility]) injection 4 mg  4 mg Intravenous Q6H PRN Lanae Crumbly, PA-C      . oxyCODONE (Oxy IR/ROXICODONE) immediate release tablet 5 mg  5 mg Oral Q4H PRN Lanae Crumbly, PA-C   5 mg at 05/16/20 7782  .  polyethylene glycol (MIRALAX / GLYCOLAX) packet 17 g  17 g Oral Daily PRN Lanae Crumbly, PA-C      . potassium chloride SA (KLOR-CON) CR tablet 20 mEq  20 mEq Oral Daily PRN Lanae Crumbly, PA-C      . simvastatin (ZOCOR) tablet 40 mg  40 mg Oral QHS Benjiman Core M, PA-C   40 mg at 05/15/20 2126  . spironolactone (ALDACTONE) tablet 25 mg  25 mg Oral Daily Benjiman Core M, PA-C   25 mg at 05/16/20 0900     Discharge Medications: Please see discharge summary for a list of discharge medications.  Relevant Imaging Results:  Relevant Lab Results:   Additional Information SS# [id number]  Sharin Mons, RN

## 2020-05-16 NOTE — Progress Notes (Signed)
Subjective: Left knee doing okay.  States that dressing too tight around her upper leg.  No complaints of calf pain.  No bowel movement since last Wednesday.  Patient has a bottle of mag citrate sitting at bedside that has not been used.  No complaints of abdominal pain.  Positive flatus.  Awaiting transfer to skilled nurse facility.  Patient is progressing slowly with rehab.   Objective: Vital signs in last 24 hours: Temp:  [97.6 F (36.4 C)-99.1 F (37.3 C)] 98.6 F (37 C) (11/22 0814) Pulse Rate:  [78-80] 78 (11/22 0814) Resp:  [16-18] 17 (11/22 0814) BP: (126-138)/(58-62) 131/61 (11/22 0814) SpO2:  [96 %-100 %] 96 % (11/22 0814)  Intake/Output from previous day: 11/21 0701 - 11/22 0700 In: 480 [P.O.:480] Out: 1400 [Urine:1400] Intake/Output this shift: No intake/output data recorded.  Recent Labs    05/14/20 0205 05/15/20 0613 05/16/20 0446  HGB 10.8* 11.4* 11.0*   Recent Labs    05/15/20 0613 05/16/20 0446  WBC 12.3* 10.3  RBC 3.74* 3.65*  HCT 35.3* 34.3*  PLT 223 218   Recent Labs    05/14/20 0205  NA 138  K 3.8  CL 109  CO2 24  BUN 15  CREATININE 0.71  GLUCOSE 149*  CALCIUM 8.5*   No results for input(s): LABPT, INR in the last 72 hours.  Exam Pleasant white female alert and oriented in no acute distress.  Abdomen nontender.  Left lower extremity dressing removed.  Patient states that her leg did feel better after this was done.  Wound looks good.  Staples intact.  No drainage or signs of infection.  Calf nontender.  Neurovascular intact.    Assessment/Plan: Patient encouraged to drink the mag citrate that she has in her room.  Will also order Dulcolax suppository.  I did speak with case management and they will work on transfer to skilled nursing facility for rehab.   Benjiman Core 05/16/2020, 9:02 AM

## 2020-05-16 NOTE — Progress Notes (Signed)

## 2020-05-16 NOTE — Plan of Care (Signed)
  Problem: Activity: Goal: Risk for activity intolerance will decrease Outcome: Progressing   Problem: Nutrition: Goal: Adequate nutrition will be maintained Outcome: Progressing   Problem: Coping: Goal: Level of anxiety will decrease Outcome: Progressing   Problem: Pain Managment: Goal: General experience of comfort will improve Outcome: Progressing   Problem: Safety: Goal: Ability to remain free from injury will improve Outcome: Progressing   

## 2020-05-16 NOTE — Progress Notes (Signed)
Declined use of CPM at this time- causing her too much discomfort

## 2020-05-16 NOTE — TOC Initial Note (Signed)
Transition of Care [facility]) - Initial/Assessment Note    Patient Details  Name: Julie Graves MRN: [id number] Date of Birth: September 17, 1940  [facility]) CM/SW Contact:    Sharin Mons, RN Phone Number: 05/16/2020, 7:46 PM  Clinical Narrative:       - s/p L TKA, 11/19. Hx of CAD, CHF, HTN, and DM. From home alone. PTA independent with ADL's.                  RNCM received consult for possible SNF placement at time of discharge. RNCM spoke with patient regarding PT recommendation of SNF placement at time of discharge. Patient reports she is currently unable to care for self  given her  current physical needs and fall risk. Patient expressed understanding of PT recommendation and is agreeable to SNF placement at time of discharge. Patient reports preference for [facility], [facility]. RNCM discussed insurance authorization process and provided Medicare SNF ratings list. Patient expressed being hopeful for rehab and to feel better soon. No further questions reported at this time. RNCM to continue to follow and assist with discharge planning needs. Pt has been COVID vaccinated.  Expected Discharge Plan: [place]     Patient Goals and CMS Choice        Expected Discharge Plan and Services Expected Discharge Plan: [place]                                              Prior Living Arrangements/Services                       Activities of Daily Living Home Assistive Devices/Equipment: Kasandra Knudsen (specify quad or straight) ADL Screening (condition at time of admission) Patient's cognitive ability adequate to safely complete daily activities?: Yes Is the patient deaf or have difficulty hearing?: No Does the patient have difficulty seeing, even when wearing glasses/contacts?: No Does the patient have difficulty concentrating, remembering, or making decisions?: No Patient able to express need for assistance with  ADLs?: Yes Does the patient have difficulty dressing or bathing?: No Independently performs ADLs?: Yes (appropriate for developmental age) Does the patient have difficulty walking or climbing stairs?: Yes Weakness of Legs: Left Weakness of Arms/Hands: None  Permission Sought/Granted                  Emotional Assessment              Admission diagnosis:  Arthritis of left knee [M17.12] S/P total knee arthroplasty, left [Z96.652] Patient Active Problem List   Diagnosis Date Noted  . Arthritis of left knee 05/13/2020  . S/P total knee arthroplasty, left 05/13/2020  . Unilateral primary osteoarthritis, left knee 03/31/2020  . History of total knee arthroplasty, right 03/31/2020  . Precordial pain   . Angina pectoris ([place]) 08/13/2017  . Mixed dyslipidemia 08/13/2017  . CAD (coronary artery disease), native coronary artery 08/13/2017  . Mild episode of recurrent major depressive disorder ([place]) 11/03/2013  . Urge incontinence 04/02/2013  . Spinal stenosis, lumbar region, without neurogenic claudication 10/27/2012  . Spinal stenosis of lumbar region 10/01/2012  . Allergic rhinitis 09/26/2011  . Dizziness 01/23/2011  . Dyspnea 01/23/2011  . Type II diabetes mellitus ([place]) 06/27/2010  . Morbid obesity ([place]) 06/27/2010  . Impaired glucose tolerance 06/27/2010  . Disorder of bone and  cartilage 06/22/2010  . Primary cardiomyopathy ([place]) 03/23/2010  . HYPERLIPIDEMIA-MIXED 03/07/2010  . Essential hypertension 03/07/2010  . CARDIOMEGALY 11/30/2009  . OTHER DYSPNEA AND RESPIRATORY ABNORMALITIES 11/30/2009  . AORTIC REGURGITATION 09/28/2009  . Chronic diastolic heart failure ([place]) 09/28/2009  . Aortic regurgitation 09/28/2009  . 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 Osteoarthrosis involving multiple sites 10/26/2008   PCP:  Harrison Mons, [place] Pharmacy:   [facility] [address] [place] - [place] AT [place] [address] Phone: [phone] Fax: [phone]     Social Determinants of Health (SDOH) Interventions    Readmission Risk Interventions No flowsheet data found.

## 2020-05-17 ENCOUNTER — Telehealth: Payer: Self-pay | Admitting: Orthopaedic Surgery

## 2020-05-17 DIAGNOSIS — M1712 Unilateral primary osteoarthritis, left knee: Secondary | ICD-10-CM | POA: Diagnosis not present

## 2020-05-17 LAB — SARS CORONAVIRUS 2 BY RT PCR (HOSPITAL ORDER, PERFORMED IN ~~LOC~~ HOSPITAL LAB): SARS Coronavirus 2: NEGATIVE

## 2020-05-17 LAB — GLUCOSE, CAPILLARY
Glucose-Capillary: 77 mg/dL (ref 70–99)
Glucose-Capillary: 132 mg/dL — ABNORMAL HIGH (ref 70–99)
Glucose-Capillary: 117 mg/dL — ABNORMAL HIGH (ref 70–99)
Glucose-Capillary: 141 mg/dL — ABNORMAL HIGH (ref 70–99)

## 2020-05-17 NOTE — Telephone Encounter (Signed)
I called and discussed in detail

## 2020-05-17 NOTE — Progress Notes (Signed)
   05/17/20 1556  Clinical Encounter Type  Visited With Patient  Visit Type Initial  Referral From Nurse  Consult/Referral To Chaplain   Chaplain responded to page from unit. Pt was appreciative of the Bible. Chaplain provided emotional support surrounding discharge plans and family. Pt expressed how her faith is a source of strength. Chaplain remains available as needed.   This note was prepared by Chaplain Resident, Dante Gang, MDiv. Chaplain remains available as needed through the on-call pager: [phone].

## 2020-05-17 NOTE — Progress Notes (Signed)
Physical Therapy Treatment Patient Details Name: Julie Graves MRN: [id number] DOB: 11-Sep-1940 Today's Date: 05/17/2020    History of Present Illness Pt is a 79 y/o female s/p L TKA. PMH includes CAD, CHF, HTN, and DM.     PT Comments    Continuing work on functional mobility and activity tolerance, with notable improvements in gait distance and sit<>stand transfers; Noted pt's daughter has asked about the possibility of going home, which is not entirely unreasonable -- my concern is that Ms. Knack indicated that her husband is not able to provide assist to her; In addition, going to a SNF for post-acute rehab would give her more intense and consistent therapies initially to maximize independence and safety with mobility, and boost her efficiency of movement and her confidence in prep for dc home; Discussed DC plans with Allene Pyo, LCSW and A. Stann Mainland, PTA; Set a stair goal, just in case   Follow Up Recommendations  Follow surgeon's recommendation for DC plan and follow-up therapies     Equipment Recommendations  Rolling walker with 5" wheels;Other (comment) (Can we get her a [place] RW set to shortest setting?)    Recommendations for Other Services       Precautions / Restrictions Precautions Precautions: Knee Precaution Booklet Issued: Yes (comment) Precaution Comments: Verbally reviewed knee precaution  Restrictions LLE Weight Bearing: Weight bearing as tolerated    Mobility  Bed Mobility Overal bed mobility: Needs Assistance Bed Mobility: Supine to Sit     Supine to sit: Supervision     General bed mobility comments: Slow moving, and HOB elevated; short scoots to EOB; initiated pushing up to sit through elbow prop, then reached for rail; inefficient scooting, but able to reciprocally scoot to EOB without physical assist  Transfers Overall transfer level: Needs assistance Equipment used: Rolling walker (2 wheeled) Transfers: Sit to/from Stand Sit to Stand: Min  assist         General transfer comment: Stood from EOB with min assist to steady RW  Ambulation/Gait Ambulation/Gait assistance: Min guard (with and without physical contact) Gait Distance (Feet): 65 Feet Assistive device: Rolling walker (2 wheeled) Gait Pattern/deviations: Step-to pattern;Antalgic Gait velocity: quite slow   General Gait Details: Cues for sequence, and to relax shoulders as much as possible; L knee still quite painful in stance, but no instability or buckling   Stairs             Wheelchair Mobility    Modified Rankin (Stroke Patients Only)       Balance     Sitting balance-Leahy Scale: Good       Standing balance-Leahy Scale: Poor (but improving)                              Cognition Arousal/Alertness: Awake/alert Behavior During Therapy: WFL for tasks assessed/performed Overall Cognitive Status: Within Functional Limits for tasks assessed                                        Exercises Total Joint Exercises Ankle Circles/Pumps: AROM;Both;20 reps Quad Sets: AROM;Left;10 reps;Supine Short Arc Quad: AAROM;Left;10 reps;Other (comment) (noting some hamstring co-contraction) Heel Slides: AAROM;Left;10 reps;Supine Straight Leg Raises: AAROM;Left;10 reps;Supine Goniometric ROM: approx 3-65 deg    General Comments        Pertinent Vitals/Pain Pain Assessment: 0-10 Pain Score: 9  Pain Location:  L knee with ROM therex Pain Descriptors / Indicators: Aching;Operative site guarding Pain Intervention(s): Monitored during session;RN gave pain meds during session    Home Living                      Prior Function            PT Goals (current goals can now be found in the care plan section) Acute Rehab PT Goals Patient Stated Goal: to decrease pain; walk again PT Goal Formulation: With patient Time For Goal Achievement: 05/27/20 Potential to Achieve Goals: Good Progress towards PT goals:  Progressing toward goals    Frequency    7X/week      PT Plan Current plan remains appropriate;Equipment recommendations need to be updated    Co-evaluation              AM-PAC PT "6 Clicks" Mobility   Outcome Measure  Help needed turning from your back to your side while in a flat bed without using bedrails?: None Help needed moving from lying on your back to sitting on the side of a flat bed without using bedrails?: None Help needed moving to and from a bed to a chair (including a wheelchair)?: A Little Help needed standing up from a chair using your arms (e.g., wheelchair or bedside chair)?: A Little Help needed to walk in hospital room?: A Lot Help needed climbing 3-5 steps with a railing? : A Lot 6 Click Score: 18    End of Session Equipment Utilized During Treatment: Gait belt Activity Tolerance: Patient tolerated treatment well Patient left: with call bell/phone within reach;in chair;with chair alarm set Nurse Communication: Mobility status PT Visit Diagnosis: Unsteadiness on feet (R26.81);Muscle weakness (generalized) (M62.81);Difficulty in walking, not elsewhere classified (R26.2)     Time: 2641-5830 PT Time Calculation (min) (ACUTE ONLY): 42 min  Charges:  $Gait Training: 8-22 mins $Therapeutic Exercise: 8-22 mins $Therapeutic Activity: 8-22 mins                     Roney Marion, PT  Acute Rehabilitation Services Pager [phone] Office [place] 05/17/2020, 2:13 PM

## 2020-05-17 NOTE — Telephone Encounter (Signed)
Patient's daughter Maudie Mercury called. She would like to talk to Dr. Lorin Mercy about releasing her mom home until they find a facility to put her for rehab. Her call back number is [phone]

## 2020-05-17 NOTE — Progress Notes (Addendum)
Physical Therapy Treatment Patient Details Name: Julie Graves MRN: [id number] DOB: [birth year] Today's Date: 05/17/2020    History of Present Illness Pt is a 80 y/o female s/p L TKA. PMH includes CAD, CHF, HTN, and DM.     PT Comments    Pt supine in bed and very motivated to participate.  She continues to require min to min guard assistance to mobilize.  Able to increase distance this pm and tolerate supine exercises.  Resting in bed post session with CP applied and knee immobilizer.     Follow Up Recommendations  Follow surgeon's recommendation for DC plan and follow-up therapies     Equipment Recommendations  Rolling walker with 5" wheels;Other (comment) (bari RW with youth height)    Recommendations for Other Services       Precautions / Restrictions Precautions Precautions: Knee Precaution Booklet Issued: Yes (comment) Precaution Comments: Verbally reviewed knee precaution  Restrictions Weight Bearing Restrictions: Yes LLE Weight Bearing: Weight bearing as tolerated    Mobility  Bed Mobility Overal bed mobility: Needs Assistance Bed Mobility: Supine to Sit     Supine to sit: Min assist Sit to supine: Min assist   General bed mobility comments: Min assistance to elevate trunk to sit edge of bed and min assistance for LLE back to bed.  Transfers Overall transfer level: Needs assistance Equipment used: Rolling walker (2 wheeled) Transfers: Sit to/from Stand Sit to Stand: Min guard;From elevated surface         General transfer comment: Bed in elevated position she was able to come to standing with min guard.  Cues for hand placement.  Ambulation/Gait Ambulation/Gait assistance: Min guard Gait Distance (Feet): 80 Feet Assistive device: Rolling walker (2 wheeled) Gait Pattern/deviations: Step-to pattern;Antalgic;Step-through pattern     General Gait Details: Cues for sequencing and progress to roll RW and progress to step through sequencing.  She had  difficulty and reverted back to step to sequencing.   Stairs             Wheelchair Mobility    Modified Rankin (Stroke Patients Only)       Balance Overall balance assessment: Needs assistance Sitting-balance support: No upper extremity supported;Feet supported Sitting balance-Leahy Scale: Good       Standing balance-Leahy Scale: Poor                              Cognition Arousal/Alertness: Awake/alert Behavior During Therapy: WFL for tasks assessed/performed Overall Cognitive Status: Within Functional Limits for tasks assessed                                        Exercises Total Joint Exercises Ankle Circles/Pumps: AROM;Both;20 reps Quad Sets: AROM;Left;10 reps;Supine Heel Slides: AAROM;Left;10 reps;Supine Hip ABduction/ADduction: AAROM;Left;10 reps;Supine Straight Leg Raises: AAROM;Left;10 reps;Supine     General Comments        Pertinent Vitals/Pain Pain Assessment: 0-10 Faces Pain Scale: Hurts even more Pain Location: L knee with ROM therex Pain Descriptors / Indicators: Aching;Operative site guarding Pain Intervention(s): Monitored during session;Repositioned    Home Living                      Prior Function            PT Goals (current goals can now be found in the care plan section) Acute  Rehab PT Goals Patient Stated Goal: to decrease pain; walk again PT Goal Formulation: With patient Time For Goal Achievement: 05/27/20 Potential to Achieve Goals: Good Progress towards PT goals: Progressing toward goals    Frequency    7X/week      PT Plan Current plan remains appropriate;Equipment recommendations need to be updated    Co-evaluation              AM-PAC PT "6 Clicks" Mobility   Outcome Measure  Help needed turning from your back to your side while in a flat bed without using bedrails?: None Help needed moving from lying on your back to sitting on the side of a flat bed without  using bedrails?: None Help needed moving to and from a bed to a chair (including a wheelchair)?: A Little Help needed standing up from a chair using your arms (e.g., wheelchair or bedside chair)?: A Little Help needed to walk in hospital room?: A Lot Help needed climbing 3-5 steps with a railing? : A Lot 6 Click Score: 18    End of Session Equipment Utilized During Treatment: Gait belt Activity Tolerance: Patient tolerated treatment well Patient left: with call bell/phone within reach;in bed;with bed alarm set Nurse Communication: Mobility status PT Visit Diagnosis: Unsteadiness on feet (R26.81);Muscle weakness (generalized) (M62.81);Difficulty in walking, not elsewhere classified (R26.2)     Time: 0141-0301 PT Time Calculation (min) (ACUTE ONLY): 33 min  Charges:  $Gait Training: 8-22 mins $Therapeutic Exercise: 8-22 mins                      Erasmo Leventhal , PTA Acute Rehabilitation Services Pager [phone] Office [phone]     Julie Graves 05/17/2020, 5:23 PM

## 2020-05-17 NOTE — Progress Notes (Signed)
   Subjective: 4 Days Post-Op Procedure(s) (LRB): LEFT TOTAL KNEE ARTHROPLASTY (Left) Patient reports pain as moderate.   FL2 finally sent to me and signed. Pending SNF.   Objective: Vital signs in last 24 hours: Temp:  [97.4 F (36.3 C)-98.6 F (37 C)] 98.5 F (36.9 C) (11/23 0435) Pulse Rate:  [70-82] 70 (11/23 0435) Resp:  [17] 17 (11/23 0435) BP: (103-131)/(43-81) 125/81 (11/23 0435) SpO2:  [96 %-98 %] 98 % (11/23 0435)  Intake/Output from previous day: 11/22 0701 - 11/23 0700 In: 480 [P.O.:480] Out: 300 [Urine:300] Intake/Output this shift: No intake/output data recorded.  Recent Labs    05/15/20 0613 05/16/20 0446  HGB 11.4* 11.0*   Recent Labs    05/15/20 0613 05/16/20 0446  WBC 12.3* 10.3  RBC 3.74* 3.65*  HCT 35.3* 34.3*  PLT 223 218   No results for input(s): NA, K, CL, CO2, BUN, CREATININE, GLUCOSE, CALCIUM in the last 72 hours. No results for input(s): LABPT, INR in the last 72 hours.  Neurologically intact No results found.  Assessment/Plan: 4 Days Post-Op Procedure(s) (LRB): LEFT TOTAL KNEE ARTHROPLASTY (Left) Up with therapy Discharge to SNF  Marybelle Killings 05/17/2020, 8:11 AM

## 2020-05-17 NOTE — Discharge Summary (Signed)
Patient ID: Julie Graves MRN: [id number] DOB/AGE: 06/28/1940 79 y.o.  Admit date: 05/13/2020 Discharge date: 05/17/2020  Admission Diagnoses:  Active Problems:   Arthritis of left knee   S/P total knee arthroplasty, left   Discharge Diagnoses:  Active Problems:   Arthritis of left knee   S/P total knee arthroplasty, left  status post Procedure(s): LEFT TOTAL KNEE ARTHROPLASTY  Past Medical History:  Diagnosis Date  . Anginal pain ([place])    "small pain?nerves in chest"started 10/05/13   . Anginal pain ([place])    cleared by cardiology 3/15  . Anxiety    rx given recent not taken yet  . Aortic insufficiency    mild on 9/11 cath  . CAD (coronary artery disease)    nonobstructive let heart cath 3/11: 40% ostial D1, 30% mid CFX  . CHF (congestive heart failure) ([place])   . Colon polyps 02/01/2009    MULTIPLE FRAGMENTS OF TUBULAR ADENOMAS  . Diabetes mellitus   . Frozen shoulder   . GERD (gastroesophageal reflux disease)    occ  . History of blood transfusion    pregnancy  . HLD (hyperlipidemia)   . HTN (hypertension)    ACEI cough  . Hx of cardiovascular stress test    Lexiscan Myoview (10/2013):  Fixed ant defect most c/w breast attenuation, cannot exclude apical infarct; no ischemia, EF 46%; Low Risk  . Nonischemic cardiomyopathy ([place])    Ech 3/11 difficult study, moderate aortic insuficiency noted. Left evntriculogram 3/11  . Obese   . Osteoarthritis   . Palpitation    PACs noted on telemetry while in the hospital  . Pneumonia    hx  . Stress incontinence     Surgeries: Procedure(s): LEFT TOTAL KNEE ARTHROPLASTY on 05/13/2020   Consultants:   Discharged Condition: Improved  Hospital Course: Julie Graves is an 79 y.o. female who was admitted 05/13/2020 for operative treatment of left knee djd. Patient failed conservative treatments (please see the history and physical for the specifics) and had severe unremitting pain that affects sleep, daily  activities and work/hobbies. After pre-op clearance, the patient was taken to the operating room on 05/13/2020 and underwent  Procedure(s): LEFT TOTAL KNEE ARTHROPLASTY.    Patient was given perioperative antibiotics:  Anti-infectives (From admission, onward)   Start     Dose/Rate Route Frequency Ordered Stop   05/13/20 0915  vancomycin (VANCOCIN) IVPB 1000 mg/200 mL premix        1,000 mg 200 mL/hr over 60 Minutes Intravenous On call to O.R. 05/13/20 0904 05/13/20 1024   05/13/20 0834  ceFAZolin (ANCEF) 2-4 GM/100ML-% IVPB  Status:  Discontinued       Note to Pharmacy: Cordelia Pen   : cabinet override      05/13/20 0834 05/13/20 1029       Patient was given sequential compression devices and early ambulation to prevent DVT.   During hospital admission patient progressed slow with PT/OT and it was recommended that patient d/c to snf for rehab before going home.  Patient agreed this to be the best option     Recent vital signs:  Patient Vitals for the past 24 hrs:  BP Temp Temp src Pulse Resp SpO2  05/17/20 0835 (!) 136/55 99.1 F (37.3 C) Oral 72 17 95 %  05/17/20 0435 125/81 98.5 F (36.9 C) Oral 70 17 98 %  05/16/20 2216 123/64 -- -- -- -- --  05/16/20 2102 (!) 103/43 (!) 97.4 F (36.3 C) Oral  72 17 97 %  05/16/20 1410 130/62 98.2 F (36.8 C) Oral 82 17 98 %     Recent laboratory studies:  Recent Labs    05/15/20 0613 05/16/20 0446  WBC 12.3* 10.3  HGB 11.4* 11.0*  HCT 35.3* 34.3*  PLT 223 218     Discharge Medications:   Allergies as of 05/17/2020      Reactions   Iodine Hives, Swelling   Shellfish Allergy Swelling, Other (See Comments)   Swelling - throat and lips   Topiramate    Other reaction(s): Chest Pain   Metamucil [psyllium]    Lisinopril Other (See Comments)   cough   Penicillins Hives, Rash, Other (See Comments)   Has patient had a PCN reaction causing immediate rash, facial/tongue/throat swelling, SOB or lightheadedness with hypotension:  Unknown Has patient had a PCN reaction causing severe rash involving mucus membranes or skin necrosis: No Has patient had a PCN reaction that required hospitalization: No Has patient had a PCN reaction occurring within the last 10 years: No If all of the above answers are "NO", then may proceed with Cephalosporin use.   Tramadol Other (See Comments)   confusion      Medication List    STOP taking these medications   aspirin 81 MG tablet Replaced by: aspirin EC 325 MG tablet     TAKE these medications   acetaminophen 500 MG tablet Commonly known as: TYLENOL Take 1,000 mg by mouth every 6 (six) hours as needed for moderate pain or headache.   alendronate 70 MG tablet Commonly known as: FOSAMAX Take 70 mg by mouth every Monday. Take with a full glass of water on an empty stomach. Take on mondays   aspirin EC 325 MG tablet Take 1 tablet (325 mg total) by mouth daily. Must take at least 4 weeks postop for DVT prophylaxis. Replaces: aspirin 81 MG tablet   CALCIUM 600/VITAMIN D3 PO Take 1 tablet by mouth daily.   Coreg 6.25 MG tablet Generic drug: carvedilol Take 6.25 mg by mouth 2 (two) times daily.   diazepam 2 MG tablet Commonly known as: VALIUM Take 2 mg by mouth daily as needed for anxiety.   EPINEPHrine 0.3 mg/0.3 mL Soaj injection Commonly known as: EPI-PEN Inject 0.3 mLs (0.3 mg total) into the muscle as needed for anaphylaxis.   fluticasone 50 MCG/ACT nasal spray Commonly known as: FLONASE Place 1 spray into both nostrils daily as needed for allergies or rhinitis.   furosemide 40 MG tablet Commonly known as: LASIX Take 40 mg by mouth daily as needed for fluid or edema.   Jardiance 10 MG Tabs tablet Generic drug: empagliflozin TAKE 1 TABLET BY MOUTH DAILY BEFORE BREAKFAST What changed: See the new instructions.   losartan 100 MG tablet Commonly known as: COZAAR Take 1 tablet (100 mg total) by mouth daily.   metFORMIN 500 MG 24 hr tablet Commonly known  as: GLUCOPHAGE-XR Take 500 mg by mouth daily.   nitroGLYCERIN 0.4 MG SL tablet Commonly known as: NITROSTAT Place 0.4 mg under the tongue every 5 (five) minutes as needed for chest pain.   oxyCODONE-acetaminophen 5-325 MG tablet Commonly known as: PERCOCET/ROXICET Take 1 tablet by mouth every 4 (four) hours as needed for severe pain.   potassium chloride SA 20 MEQ tablet Commonly known as: KLOR-CON Take 20 mEq by mouth daily as needed (when taking furosemide).   simvastatin 40 MG tablet Commonly known as: ZOCOR Take 40 mg by mouth at bedtime.   spironolactone  25 MG tablet Commonly known as: ALDACTONE TAKE 1 TABLET(25 MG) BY MOUTH DAILY What changed: See the new instructions.   Vitamin D 50 MCG (2000 UT) Caps Take 2,000 Units by mouth daily.       Diagnostic Studies: DG Knee 1-2 Views Left  Result Date: 05/13/2020 CLINICAL DATA:  Status post left knee replacement EXAM: LEFT KNEE - 1-2 VIEW COMPARISON:  July 14, 2019 FINDINGS: The patient is status post left knee replacement. Femoral and tibial components are in good position. Skin staples are identified. Postoperative soft tissue air identified. IMPRESSION: Left knee replacement as above. Electronically Signed   By: Dorise Bullion III M.D   On: 05/13/2020 14:56       Follow-up Information    Schedule an appointment as soon as possible for a visit with Marybelle Killings, MD.   Specialty: Orthopedic Surgery Why: need return office visit 2 weeks postop Contact information: [address] [place] [address] [phone]               Discharge Plan:  discharge to snf       Signed: Benjiman Core for Rodell Perna MD [phone] 05/17/2020, 11:27 AM

## 2020-05-17 NOTE — Plan of Care (Signed)

## 2020-05-17 NOTE — TOC Progression Note (Signed)
Transition of Care [facility]) - Progression Note    Patient Details  Name: Julie Graves MRN: [id number] Date of Birth: Mar 06, 1941  Transition of Care [facility]) CM/SW [place], [place] Phone Number: 05/17/2020, 3:10 PM  Clinical Narrative:     CSW gave pt and daughter kim bed offers. Pt chose [facility]. [place] [facility] can accept pt for snf tomorrow 11/24. Pt reports she is covid vaccinated. .CSW requested new covid test from MD.   [facility] [address] reference # [phone]. Clinicals have been faxed.   Expected Discharge Plan: [place]    Expected Discharge Plan and Services Expected Discharge Plan: [place]                                               Social Determinants of Health (SDOH) Interventions    Readmission Risk Interventions No flowsheet data found.  Emeterio Reeve, Latanya Presser, [place] Social Worker [phone]

## 2020-05-17 NOTE — Plan of Care (Signed)

## 2020-05-17 NOTE — Telephone Encounter (Signed)
Please advise 

## 2020-05-18 DIAGNOSIS — M1712 Unilateral primary osteoarthritis, left knee: Secondary | ICD-10-CM | POA: Diagnosis not present

## 2020-05-18 LAB — GLUCOSE, CAPILLARY
Glucose-Capillary: 111 mg/dL — ABNORMAL HIGH (ref 70–99)
Glucose-Capillary: 98 mg/dL (ref 70–99)
Glucose-Capillary: 97 mg/dL (ref 70–99)
Glucose-Capillary: 93 mg/dL (ref 70–99)

## 2020-05-18 NOTE — Plan of Care (Signed)

## 2020-05-18 NOTE — Progress Notes (Signed)
Addendum to discharge summary done yesterday 05/17/21  Patient continued with daily therapy.  She remained on diabetic diet.  Last hemoglobin done 05/16/2020 was 11.0 and stable.  Plan is skilled facility for continued postop left total knee arthroplasty rehab.  She is weightbearing as tolerated.

## 2020-05-18 NOTE — Progress Notes (Signed)
Physical Therapy Treatment Patient Details Name: Julie Graves MRN: [id number] DOB: 12-29-1940 Today's Date: 05/18/2020    History of Present Illness Pt is a 79 y/o female s/p L TKA. PMH includes CAD, CHF, HTN, and DM.     PT Comments    Pt up in chair on arrival, agreeable to therapy session after being premedicated. Pt progressed gait distance to ~65ft using bari RW and min guard assist at most, needing multiple brief standing rest breaks 2/2 pain/fatigue. Pt performed transfers and bed mobility with minA. VSS during mobility on RA. Pt performed supine LLE A/AAROM therapeutic exercises per TKA packet, encouraged pt to continue TID as able, pt continues to have difficulty with SLR (needs AA). Pt continues to benefit from skilled rehab in a post acute setting to maximize functional gains before returning home. Pt in bed with cold packs applied at end of session, alarm on for safety.  Follow Up Recommendations  Follow surgeon's recommendation for DC plan and follow-up therapies     Equipment Recommendations  Rolling walker with 5" wheels;Other (comment) (bari RW with youth height)    Recommendations for Other Services       Precautions / Restrictions Precautions Precautions: Knee Precaution Booklet Issued: Yes (comment) Precaution Comments: Verbally reviewed knee precautions per TKA handout Required Braces or Orthoses: Knee Immobilizer - Left Knee Immobilizer - Left: Discontinue once straight leg raise with < 10 degree lag Restrictions Weight Bearing Restrictions: Yes LLE Weight Bearing: Weight bearing as tolerated    Mobility  Bed Mobility Overal bed mobility: Needs Assistance Bed Mobility: Sit to Supine       Sit to supine: Min assist   General bed mobility comments: minA for LLE guidance to supine, use of bed features; pt up in chair on arrival to room  Transfers Overall transfer level: Needs assistance Equipment used: Rolling walker (2 wheeled) Transfers: Sit  to/from Stand Sit to Stand: Min assist         General transfer comment: from chair height to [employer], cues for hand placement  Ambulation/Gait Ambulation/Gait assistance: Min guard Gait Distance (Feet): 80 Feet Assistive device: Rolling walker (2 wheeled) Gait Pattern/deviations: Step-to pattern;Antalgic;Step-through pattern Gait velocity: grossly decreased Gait velocity interpretation: <1.31 ft/sec, indicative of household ambulator General Gait Details: verbal cues for increased RW distance from body and step-through sequencing, pt tending to revert back to step-to sequencing and leaning body over front of RW without continuous cues   Stairs             Wheelchair Mobility    Modified Rankin (Stroke Patients Only)       Balance Overall balance assessment: Needs assistance Sitting-balance support: No upper extremity supported;Feet supported Sitting balance-Leahy Scale: Good     Standing balance support: Bilateral upper extremity supported;During functional activity Standing balance-Leahy Scale: Poor Standing balance comment: reliant on RW for balance 2/2 L knee pain                            Cognition Arousal/Alertness: Awake/alert Behavior During Therapy: WFL for tasks assessed/performed Overall Cognitive Status: Within Functional Limits for tasks assessed                                 General Comments: pleasantly cooperative      Exercises Total Joint Exercises Ankle Circles/Pumps: AROM;Both;10 reps;Supine Quad Sets: AROM;Strengthening;Both;10 reps;Supine Gluteal Sets: AROM;Strengthening;Both;10 reps;Supine Heel  Slides: AAROM;Left;10 reps;Supine Hip ABduction/ADduction: AROM;Left;10 reps;Supine Straight Leg Raises: AAROM;Left;10 reps;Supine Goniometric ROM: approx 5-65 deg L knee    General Comments General comments (skin integrity, edema, etc.): pt c/o discomfort with KI brace, placed wash cloth posterior to heel to  prevent chafing; NT notified pt needs new purewick placed at end of session      Pertinent Vitals/Pain Pain Assessment: 0-10 Pain Score: 7  Pain Location: L knee with ROM therex/gait Pain Descriptors / Indicators: Aching;Operative site guarding Pain Intervention(s): Monitored during session;Premedicated before session;Ice applied;Repositioned   Vitals:   05/18/20 0901  BP: (!) 128/59  Pulse: 79  Resp: 17  Temp: 98.4 F (36.9 C)  SpO2: 98%    Home Living                      Prior Function            PT Goals (current goals can now be found in the care plan section) Acute Rehab PT Goals Patient Stated Goal: to decrease pain; walk again PT Goal Formulation: With patient Time For Goal Achievement: 05/27/20 Potential to Achieve Goals: Good Progress towards PT goals: Progressing toward goals    Frequency    7X/week      PT Plan Current plan remains appropriate;Equipment recommendations need to be updated    Co-evaluation              AM-PAC PT "6 Clicks" Mobility   Outcome Measure  Help needed turning from your back to your side while in a flat bed without using bedrails?: None Help needed moving from lying on your back to sitting on the side of a flat bed without using bedrails?: None Help needed moving to and from a bed to a chair (including a wheelchair)?: A Little Help needed standing up from a chair using your arms (e.g., wheelchair or bedside chair)?: A Little Help needed to walk in hospital room?: A Little Help needed climbing 3-5 steps with a railing? : Total 6 Click Score: 18    End of Session Equipment Utilized During Treatment: Gait belt Activity Tolerance: Patient tolerated treatment well Patient left: in bed;with call bell/phone within reach;with bed alarm set Nurse Communication: Mobility status PT Visit Diagnosis: Unsteadiness on feet (R26.81);Muscle weakness (generalized) (M62.81);Difficulty in walking, not elsewhere classified  (R26.2)     Time: 2633-3545 PT Time Calculation (min) (ACUTE ONLY): 36 min  Charges:  $Gait Training: 8-22 mins $Therapeutic Exercise: 8-22 mins                     Millissa Deese P., PTA Acute Rehabilitation Services Pager: [phone] Office: [place] 05/18/2020, 12:05 PM

## 2020-05-18 NOTE — Progress Notes (Signed)
Report given to [facility].Patient made aware

## 2020-05-18 NOTE — Progress Notes (Signed)
RN updated patient daughter about d/c plans. [facility] called[facility] to given report to nurse but no answer. Patient awaiting PTAR to be d/c.

## 2020-05-18 NOTE — TOC Transition Note (Addendum)
Transition of Care [facility]) - CM/SW Discharge Note   Patient Details  Name: Julie Graves MRN: [id number] Date of Birth: 07/17/1940  Transition of Care [facility]) CM/SW Contact:  Pollie Friar, RN Phone Number: 05/18/2020, 3:45 PM   Clinical Narrative:    Pt is discharging to [facility] today. CM verified they have room available and pt will transport via PTAR. Bedside RN updated. No changes to the d/c summary dated 05/17/2020.  Room: 701P Number for report: [phone]  8978: D/c in and PTAR called. Bedside RN to call and update the daughter.    Final next level of care: Skilled Nursing Facility Barriers to Discharge: No Barriers Identified   Patient Goals and CMS Choice   CMS Medicare.gov Compare Post Acute Care list provided to:: Patient Represenative (must comment) Choice offered to / list presented to : Adult Children, Patient  Discharge Placement              Patient chooses bed at: [facility] Patient to be transferred to facility by: PTAR      Discharge Plan and Services                                     Social Determinants of Health (SDOH) Interventions     Readmission Risk Interventions No flowsheet data found.

## 2020-05-19 NOTE — Progress Notes (Signed)
PTAR to transfer patient to Rehab facility along with documents. Vital signs taken and patient has belongings at bedside .

## 2020-05-27 ENCOUNTER — Ambulatory Visit (INDEPENDENT_AMBULATORY_CARE_PROVIDER_SITE_OTHER): Payer: Medicare Other

## 2020-05-27 ENCOUNTER — Ambulatory Visit (INDEPENDENT_AMBULATORY_CARE_PROVIDER_SITE_OTHER): Payer: Medicare Other | Admitting: Orthopaedic Surgery

## 2020-05-27 ENCOUNTER — Encounter: Payer: Self-pay | Admitting: Orthopaedic Surgery

## 2020-05-27 VITALS — BP 88/54 | HR 68 | Ht 60.0 in | Wt 214.6 lb

## 2020-05-27 DIAGNOSIS — Z96652 Presence of left artificial knee joint: Secondary | ICD-10-CM

## 2020-05-27 NOTE — Progress Notes (Signed)
Post-Op Visit Note   Patient: Julie Graves           Date of Birth: Aug 13, 1940           MRN: [id number] Visit Date: 05/27/2020 PCP: [place] Mons, PA   Assessment & Plan: Post left total knee arthroplasty.  She can come out to full extension.  She is flexing to about 80 degrees.  She is making progress slowly but is not happy about the fact that she has pain as expected with surgery.  Chief Complaint:  Chief Complaint  Patient presents with  . Left Knee - Follow-up    05/13/2020 left TKA   Visit Diagnoses:  1. Status post total left knee replacement     Plan: Patient skilled facility transition to home health physical therapy.  Staples harvested return 5 weeks.  She is making good progress.  Follow-Up Instructions: Return in about 5 weeks (around 07/01/2020).   Orders:  Orders Placed This Encounter  Procedures  . XR Knee 1-2 Views Left   No orders of the defined types were placed in this encounter.   Imaging: XR Knee 1-2 Views Left  Result Date: 05/27/2020 Standing AP both knees lateral left knee obtained and reviewed this shows left total knee arthroplasty with staples present good position and alignment and correction of preoperative valgus. Impression: Satisfactory left total knee arthroplasty.   PMFS History: Patient Active Problem List   Diagnosis Date Noted  . Arthritis of left knee 05/13/2020  . S/P total knee arthroplasty, left 05/13/2020  . Unilateral primary osteoarthritis, left knee 03/31/2020  . History of total knee arthroplasty, right 03/31/2020  . Precordial pain   . Angina pectoris ([place]) 08/13/2017  . Mixed dyslipidemia 08/13/2017  . CAD (coronary artery disease), native coronary artery 08/13/2017  . Mild episode of recurrent major depressive disorder ([place]) 11/03/2013  . Urge incontinence 04/02/2013  . Spinal stenosis, lumbar region, without neurogenic claudication 10/27/2012  . Spinal stenosis of lumbar region 10/01/2012  . Allergic  rhinitis 09/26/2011  . Dizziness 01/23/2011  . Dyspnea 01/23/2011  . Type II diabetes mellitus ([place]) 06/27/2010  . Morbid obesity ([place]) 06/27/2010  . Impaired glucose tolerance 06/27/2010  . Disorder of bone and cartilage 06/22/2010  . Primary cardiomyopathy ([place]) 03/23/2010  . HYPERLIPIDEMIA-MIXED 03/07/2010  . Essential hypertension 03/07/2010  . CARDIOMEGALY 11/30/2009  . OTHER DYSPNEA AND RESPIRATORY ABNORMALITIES 11/30/2009  . AORTIC REGURGITATION 09/28/2009  . Chronic diastolic heart failure ([place]) 09/28/2009  . Aortic regurgitation 09/28/2009  . 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 Osteoarthrosis involving multiple sites 10/26/2008   Past Medical History:  Diagnosis Date  . Anginal pain ([place])    "small pain?nerves in chest"started 10/05/13   . Anginal pain ([place])    cleared by cardiology 3/15  . Anxiety    rx given recent not taken yet  . Aortic insufficiency    mild on 9/11 cath  . CAD (coronary artery disease)    nonobstructive let heart cath 3/11: 40% ostial D1, 30% mid CFX  . CHF (congestive heart failure) ([place])   . Colon polyps 02/01/2009    MULTIPLE FRAGMENTS OF TUBULAR ADENOMAS  . Diabetes mellitus   . Frozen shoulder   . GERD (gastroesophageal reflux disease)    occ  . History of blood transfusion    pregnancy  . HLD (hyperlipidemia)   . HTN (hypertension)    ACEI cough  . Hx of cardiovascular stress test    Lexiscan Myoview (10/2013):  Fixed ant defect  most c/w breast attenuation, cannot exclude apical infarct; no ischemia, EF 46%; Low Risk  . Nonischemic cardiomyopathy ([place])    Ech 3/11 difficult study, moderate aortic insuficiency noted. Left evntriculogram 3/11  . Obese   . Osteoarthritis   . Palpitation    PACs noted on telemetry while in the hospital  . Pneumonia    hx  . Stress incontinence     Family History  Problem Relation Age of Onset  . Heart attack Father   . Heart attack Sister   . Stroke Mother 21  . Diabetes Sister   . Heart  disease Sister   . Diabetes Brother   . Colon cancer Brother   . Breast cancer Neg Hx     Past Surgical History:  Procedure Laterality Date  . ABDOMINAL HYSTERECTOMY    . APPENDECTOMY    . BLADDER NECK SUSPENSION  85  . CARDIAC CATHETERIZATION    . EYE SURGERY    . HAND SURGERY     right "had knots cut out"  . KNEE ARTHROPLASTY Right 12/09/2013   Procedure: COMPUTER ASSISTED Right TOTAL KNEE ARTHROPLASTY;  Surgeon: Marybelle Killings, MD;  Location: [facility];  Service: Orthopedics;  Laterality: Right;  . LEFT HEART CATH AND CORONARY ANGIOGRAPHY N/A 08/15/2017   Procedure: LEFT HEART CATH AND CORONARY ANGIOGRAPHY;  Surgeon: Jettie Booze, MD;  Location: [facility];  Service: Cardiovascular;  Laterality: N/A;  . TOTAL KNEE ARTHROPLASTY Left 05/13/2020   Procedure: LEFT TOTAL KNEE ARTHROPLASTY;  Surgeon: Marybelle Killings, MD;  Location: [facility];  Service: Orthopedics;  Laterality: Left;  . TUBAL LIGATION     Social History   Occupational History  . Not on file  Tobacco Use  . Smoking status: Former Smoker    Packs/day: 1.00    Years: 15.00    Pack years: 15.00    Types: Cigarettes    Quit date: 10/10/1983    Years since quitting: 36.6  . Smokeless tobacco: Never Used  . Tobacco comment: Quit 1970s  Vaping Use  . Vaping Use: Never used  Substance and Sexual Activity  . Alcohol use: No    Alcohol/week: 0.0 standard drinks  . Drug use: No  . Sexual activity: Not on file

## 2020-06-16 ENCOUNTER — Ambulatory Visit: Payer: Medicare Other | Admitting: Cardiology

## 2020-06-29 ENCOUNTER — Other Ambulatory Visit: Payer: Self-pay

## 2020-06-29 ENCOUNTER — Encounter: Payer: Self-pay | Admitting: Cardiology

## 2020-06-29 ENCOUNTER — Ambulatory Visit: Payer: Medicare Other | Admitting: Cardiology

## 2020-06-29 VITALS — BP 127/62 | HR 46 | Ht 60.0 in | Wt 214.0 lb

## 2020-06-29 DIAGNOSIS — I428 Other cardiomyopathies: Secondary | ICD-10-CM

## 2020-06-29 DIAGNOSIS — I10 Essential (primary) hypertension: Secondary | ICD-10-CM

## 2020-06-29 DIAGNOSIS — I5032 Chronic diastolic (congestive) heart failure: Secondary | ICD-10-CM

## 2020-06-29 NOTE — Progress Notes (Signed)
Primary Physician/Referring:  Harrison Mons, PA  Patient ID: Julie Graves, female    DOB: [birth year], 80 y.o.   MRN: [id number]  Chief Complaint  Patient presents with  . Follow-up  . Congestive Heart Failure  . Hypertension   HPI:    Julie Graves  is a 80 y.o. female  with  H/O morbid obesity, , nonischemic cardiomyopathy with EF 40% by echo in June 2019. Coronary angiogram on 08/21/17 and revealed mid circumflex 50% and small with 2 distal diagonal 80% stenosis. She has mild Chronic stable angina pectoris, mild to moderate aortic insufficiency, pre diabetes (Patient reluctant to starting metformin due to side-effects), hypertension, hyperlipidemia, former tobacco use with 15-pack-year history.  She underwent left total knee arthroplasty on 05/13/2020 without any periprocedural cardiac complications.  She presents here for 47-monthoffice visit for chronic diastolic heart failure and hypertension.  States that except for significant leg edema in the left leg and left knee she has recuperated well from surgery.  Past Medical History:  Diagnosis Date  . Anginal pain ([facility]    "small pain?nerves in chest"started 10/05/13   . Anginal pain ([place]    cleared by cardiology 3/15  . Anxiety    rx given recent not taken yet  . Aortic insufficiency    mild on 9/11 cath  . CAD (coronary artery disease)    nonobstructive let heart cath 3/11: 40% ostial D1, 30% mid CFX  . CHF (congestive heart failure) ([facility]   . Colon polyps 02/01/2009    MULTIPLE FRAGMENTS OF TUBULAR ADENOMAS  . Diabetes mellitus   . Frozen shoulder   . GERD (gastroesophageal reflux disease)    occ  . History of blood transfusion    pregnancy  . HLD (hyperlipidemia)   . HTN (hypertension)    ACEI cough  . Hx of cardiovascular stress test    Lexiscan Myoview (10/2013):  Fixed ant defect most c/w breast attenuation, cannot exclude apical infarct; no ischemia, EF 46%; Low Risk  . Nonischemic cardiomyopathy ([facility]     Ech 3/11 difficult study, moderate aortic insuficiency noted. Left evntriculogram 3/11  . Obese   . Osteoarthritis   . Palpitation    PACs noted on telemetry while in the hospital  . Pneumonia    hx  . Stress incontinence    Past Surgical History:  Procedure Laterality Date  . ABDOMINAL HYSTERECTOMY    . APPENDECTOMY    . BLADDER NECK SUSPENSION  85  . CARDIAC CATHETERIZATION    . EYE SURGERY    . HAND SURGERY     right "had knots cut out"  . KNEE ARTHROPLASTY Right 12/09/2013   Procedure: COMPUTER ASSISTED Right TOTAL KNEE ARTHROPLASTY;  Surgeon: MMarybelle Killings MD;  [facility]  Service: Orthopedics;  Laterality: Right;  . LEFT HEART CATH AND CORONARY ANGIOGRAPHY N/A 08/15/2017   Procedure: LEFT HEART CATH AND CORONARY ANGIOGRAPHY;  Surgeon: VJettie Booze MD;  [facility];  Service: Cardiovascular;  Laterality: N/A;  . TOTAL KNEE ARTHROPLASTY Left 05/13/2020   Procedure: LEFT TOTAL KNEE ARTHROPLASTY;  Surgeon: YMarybelle Killings MD;  [facility]  Service: Orthopedics;  Laterality: Left;  . TUBAL LIGATION     Family History  Problem Relation Age of Onset  . Heart attack Father   . Heart attack Sister   . Stroke Mother 275 . Diabetes Sister   . Heart disease Sister   . Diabetes Brother   . Colon  cancer Brother   . Breast cancer Neg Hx     Social History   Tobacco Use  . Smoking status: Former Smoker    Packs/day: 1.00    Years: 15.00    Pack years: 15.00    Types: Cigarettes    Quit date: 10/10/1983    Years since quitting: 36.7  . Smokeless tobacco: Never Used  . Tobacco comment: Quit 1970s  Substance Use Topics  . Alcohol use: No    Alcohol/week: 0.0 standard drinks   ROS  Review of Systems  Cardiovascular: Positive for dyspnea on exertion and leg swelling. Negative for chest pain.  Musculoskeletal: Positive for joint pain.  Gastrointestinal: Negative for melena.   Objective  Blood pressure 127/62, pulse (!) 46, height 5'  (1.524 m), weight 214 lb (97.1 kg), SpO2 98 %.  Vitals with BMI 06/29/2020 05/27/2020 05/19/2020  Height 5' 0"  5' 0"  -  Weight 214 lbs 214 lbs 10 oz -  BMI 94.70 76.15 -  Systolic 183 88 437  Diastolic 62 54 64  Pulse 46 68 75     Physical Exam Constitutional:      Comments: Morbidly obese in no acute distress.  Cardiovascular:     Rate and Rhythm: Normal rate and regular rhythm.     Pulses:          Carotid pulses are 2+ on the right side and 2+ on the left side.      Dorsalis pedis pulses are 2+ on the right side and 2+ on the left side.       Posterior tibial pulses are 2+ on the right side and 2+ on the left side.     Heart sounds: Normal heart sounds. No murmur heard. No gallop.      Comments: Femoral and popliteal pulse difficult to feel due to patient's body habitus.  1+ pitting below knee right leg  And 2+ left. Left knee swollen. No signs of inflammation JVD difficult to see due to short neck. Pulmonary:     Effort: Pulmonary effort is normal.     Breath sounds: Normal breath sounds.  Abdominal:     General: Bowel sounds are normal.     Palpations: Abdomen is soft.     Comments: Obese. Pannus present  Musculoskeletal:     Comments: Left knee post arthroplasty edema and tenderness. No signs of inflammation.     Laboratory examination:   Recent Labs    09/29/19 1305 05/09/20 1126 05/14/20 0205  NA 140 137 138  K 4.5 4.0 3.8  CL 104 103 109  CO2 23 26 24   GLUCOSE 97 108* 149*  BUN 17 17 15   CREATININE 0.72 0.75 0.71  CALCIUM 9.7 9.6 8.5*  GFRNONAA 80 >60 >60  GFRAA 93  --   --    CrCl cannot be calculated (Patient's most recent lab result is older than the maximum 21 days allowed.).  CMP Latest Ref Rng & Units 05/14/2020 05/09/2020 09/29/2019  Glucose 70 - 99 mg/dL 149(H) 108(H) 97  BUN 8 - 23 mg/dL 15 17 17   Creatinine 0.44 - 1.00 mg/dL 0.71 0.75 0.72  Sodium 135 - 145 mmol/L 138 137 140  Potassium 3.5 - 5.1 mmol/L 3.8 4.0 4.5  Chloride 98 - 111 mmol/L 109  103 104  CO2 22 - 32 mmol/L 24 26 23   Calcium 8.9 - 10.3 mg/dL 8.5(L) 9.6 9.7  Total Protein 6.5 - 8.1 g/dL - 6.8 -  Total Bilirubin 0.3 - 1.2  mg/dL - 0.9 -  Alkaline Phos 38 - 126 U/L - 29(L) -  AST 15 - 41 U/L - 14(L) -  ALT 0 - 44 U/L - 12 -   CBC Latest Ref Rng & Units 05/16/2020 05/15/2020 05/14/2020  WBC 4.0 - 10.5 K/uL 10.3 12.3(H) 13.0(H)  Hemoglobin 12.0 - 15.0 g/dL 11.0(L) 11.4(L) 10.8(L)  Hematocrit 36.0 - 46.0 % 34.3(L) 35.3(L) 33.3(L)  Platelets 150 - 400 K/uL 218 223 206   External labs:   11/25/2019: A1c 5.7%.  Hb 12.6/HCT 37.5, platelets 203.  Total cholesterol 135, triglycerides 131, HDL 60, LDL 52.  Serum glucose 90 mg, BUN 12, creatinine 0.59, EGFR greater than 60 mL, potassium 4.8, sodium 140, CMP otherwise normal.  Medications and allergies   Allergies  Allergen Reactions  . Iodine Hives and Swelling  . Shellfish Allergy Swelling and Other (See Comments)    Swelling - throat and lips  . Topiramate     Other reaction(s): Chest Pain  . Metamucil [Psyllium]   . Lisinopril Other (See Comments)    cough  . Penicillins Hives, Rash and Other (See Comments)    Has patient had a PCN reaction causing immediate rash, facial/tongue/throat swelling, SOB or lightheadedness with hypotension: Unknown Has patient had a PCN reaction causing severe rash involving mucus membranes or skin necrosis: No Has patient had a PCN reaction that required hospitalization: No Has patient had a PCN reaction occurring within the last 10 years: No If all of the above answers are "NO", then may proceed with Cephalosporin use.   . Tramadol Other (See Comments)    confusion    Current Outpatient Medications on File Prior to Visit  Medication Sig Dispense Refill  . acetaminophen (TYLENOL) 500 MG tablet Take 1,000 mg by mouth every 6 (six) hours as needed for moderate pain or headache.     . alendronate (FOSAMAX) 70 MG tablet Take 70 mg by mouth every Monday. Take with a full glass of  water on an empty stomach. Take on mondays    . aspirin EC 81 MG tablet Take 81 mg by mouth daily. Swallow whole.    . Calcium Carb-Cholecalciferol (CALCIUM 600/VITAMIN D3 PO) Take 1 tablet by mouth daily.     . carvedilol (COREG) 6.25 MG tablet Take 6.25 mg by mouth 2 (two) times daily.    . Cholecalciferol (VITAMIN D) 2000 units CAPS Take 2,000 Units by mouth daily.    . diazepam (VALIUM) 2 MG tablet Take 2 mg by mouth daily as needed for anxiety.     Marland Kitchen EPINEPHrine 0.3 mg/0.3 mL IJ SOAJ injection Inject 0.3 mLs (0.3 mg total) into the muscle as needed for anaphylaxis. 1 Device 0  . fluticasone (FLONASE) 50 MCG/ACT nasal spray Place 1 spray into both nostrils daily as needed for allergies or rhinitis.    Marland Kitchen JARDIANCE 10 MG TABS tablet TAKE 1 TABLET BY MOUTH DAILY BEFORE BREAKFAST 30 tablet 6  . losartan (COZAAR) 100 MG tablet Take 1 tablet (100 mg total) by mouth daily. 90 tablet 3  . metFORMIN (GLUCOPHAGE-XR) 500 MG 24 hr tablet Take 500 mg by mouth daily.    . nitroGLYCERIN (NITROSTAT) 0.4 MG SL tablet Place 0.4 mg under the tongue every 5 (five) minutes as needed for chest pain.    Marland Kitchen oxyCODONE (OXY IR/ROXICODONE) 5 MG immediate release tablet Take 5 mg by mouth 2 (two) times daily as needed.    Marland Kitchen oxyCODONE-acetaminophen (PERCOCET/ROXICET) 5-325 MG tablet Take 1 tablet by mouth  every 4 (four) hours as needed for severe pain. 50 tablet 0  . potassium chloride SA (K-DUR) 20 MEQ tablet Take 20 mEq by mouth daily as needed (when taking furosemide).     . simvastatin (ZOCOR) 40 MG tablet Take 40 mg by mouth at bedtime.    Marland Kitchen spironolactone (ALDACTONE) 25 MG tablet TAKE 1 TABLET(25 MG) BY MOUTH DAILY (Patient taking differently: Take 25 mg by mouth daily.) 90 tablet 2  . furosemide (LASIX) 40 MG tablet Take 40 mg by mouth daily as needed for fluid or edema.  (Patient not taking: No sig reported)     No current facility-administered medications on file prior to visit.    Radiology:   No results  found.  Cardiac Studies:   Stress nuclear study 11/05/2013:  Exercise Capacity:  Lexiscan with no exercise. BP Response:  Normal blood pressure response. Clinical Symptoms:  There is dyspnea. ECG Impression:  No significant ECG changes with Lexiscan. Comparison with Prior Nuclear Study: No images to compare to, however the clinical repeat appears to be similar. Overall Impression:  Low risk stress nuclear study With mild apical breast attenuation. Cannot exclude small apical infarct. LV Wall Motion:  Unable to adequately it says the overall cardiac function as the gated images were not adequately established to capture wall motion.  Coronary angiogram 07/2017: mid circumflex 50%, D1 ostial 50% and mid 80%, very small. Mild noncritical CAD other vessels. EF 45%.  Echocardiogram 09/21/2019:  Left ventricle cavity is normal in size. Moderate concentric hypertrophy  of the left ventricle. Moderate global hypokinesis. LVEF 40-45%. Grade I  diastolic function. Normal left atrial pressure.   Left atrial cavity is severely dilated. LA Vol index 51 ml/m2.  Trileaflet aortic valve with mild aortic valve leaflet calcification.  Moderate (Grade II) aortic regurgitation.  Moderate (Grade II) mitral regurgitation.  Moderate tricuspid regurgitation. Estimated pulmonary artery systolic  pressure is 20 mmHg.  Mild pulmonic regurgitation. No significant change from 12/20/2017.   EKG   EKG 06/29/2020: Normal sinus rhythm at rate of 74 bpm, left axis deviation, left anterior fascicular block.  Right bundle branch block.  Bifascicular block.  PVCs (2).  LVH with repolarization abnormality, cannot exclude high lateral ischemia.  No significant change from 09/09/2019.  PVC new.    Assessment     ICD-10-CM   1. Chronic diastolic heart failure (HCC)  I50.32 EKG 12-Lead  2. Non-ischemic cardiomyopathy ([place])  I42.8   3. Essential hypertension  I10     No orders of the defined types were placed in this  encounter.   Medications Discontinued During This Encounter  Medication Reason  . aspirin EC 325 MG tablet Dose change  . oxyCODONE (OXY IR/ROXICODONE) 5 MG immediate release tablet Completed Course  . furosemide (LASIX) 20 MG tablet Error    Recommendations:   Julie Graves  is a 80 y.o. female  with  H/O morbid obesity, , nonischemic cardiomyopathy with EF 40% by echo in June 2019. Coronary angiogram on 08/21/17 and revealed mid circumflex 50% and small with 2 distal diagonal 80% stenosis. She has mild Chronic stable angina pectoris, mild to moderate aortic insufficiency, pre diabetes (Patient reluctant to starting metformin due to side-effects), hypertension, hyperlipidemia, former tobacco use with 15-pack-year history.  She underwent left total knee arthroplasty on 05/13/2020 without any periprocedural cardiac complications.  Her left leg edema is related to postoperative state, no clinical evidence of DVT, there is no tenderness.  Advised her to use furosemide 40 mg  that she takes on a as needed basis for the next 1 week or so until the swelling is gone.  Since spironolactone has been added, blood pressure has been under excellent control and she has not had any further leg edema.  Heart failure symptoms and signs appear to be stable, there is no JVD, right leg only minimal trace edema and there is no hepatomegaly.  I reassured her.  Weight loss again discussed with the patient.   Adrian Prows, MD, [facility] 06/29/2020, 6:08 PM Office: [phone] Pager: [phone]

## 2020-07-01 ENCOUNTER — Ambulatory Visit: Payer: Medicare Other | Admitting: Orthopaedic Surgery

## 2020-07-01 ENCOUNTER — Encounter: Payer: Self-pay | Admitting: Orthopaedic Surgery

## 2020-07-01 VITALS — BP 132/84 | HR 96 | Ht 60.0 in | Wt 214.0 lb

## 2020-07-01 DIAGNOSIS — Z96652 Presence of left artificial knee joint: Secondary | ICD-10-CM

## 2020-07-01 NOTE — Addendum Note (Signed)
Addended by: Meyer Cory on: 07/01/2020 02:46 PM   Modules accepted: Orders

## 2020-07-01 NOTE — Progress Notes (Signed)
Post-Op Visit Note   Patient: Julie Graves           Date of Birth: [birth year]           MRN: [id number] Visit Date: 07/01/2020 PCP: Harrison Mons, PA   Assessment & Plan: Postop total knee arthroplasty patient went to skilled facility nursing home.  She has home therapy but cannot walk up her own stairs yet.  Opposite total knee arthroplasty several years ago she was able to do stairs easier.  She has full extension she has weakness and problems with straight leg raising.  Pitting edema both lower extremities.  She needs to go home put her teds on if she does not have teds she will call us back and we can apply some short teds to her.  She has a diuretic she was given for a few days by her PCP.  Chief Complaint:  Chief Complaint  Patient presents with  . Left Knee - Follow-up    05/13/2020 Left TKA   Visit Diagnoses:  1. S/P total knee arthroplasty, left     Plan: Patient is making slow progress.  She needs more flexion needs to use her test to get the edema out of her leg elevate her leg more.  Continue working on Forensic scientist with outpatient therapy and I will recheck her in 4 weeks.  She has some hydrocodone at home that she can use for pain.  Follow-Up Instructions: Return in about 4 weeks (around 07/29/2020).   Orders:  No orders of the defined types were placed in this encounter.  No orders of the defined types were placed in this encounter.   Imaging: No results found.  PMFS History: Patient Active Problem List   Diagnosis Date Noted  . Arthritis of left knee 05/13/2020  . S/P total knee arthroplasty, left 05/13/2020  . History of total knee arthroplasty, right 03/31/2020  . Precordial pain   . Angina pectoris ([place]) 08/13/2017  . Mixed dyslipidemia 08/13/2017  . CAD (coronary artery disease), native coronary artery 08/13/2017  . Mild episode of recurrent major depressive disorder ([place]) 11/03/2013  . Urge incontinence 04/02/2013  . Spinal stenosis,  lumbar region, without neurogenic claudication 10/27/2012  . Spinal stenosis of lumbar region 10/01/2012  . Allergic rhinitis 09/26/2011  . Dizziness 01/23/2011  . Dyspnea 01/23/2011  . Type II diabetes mellitus ([place]) 06/27/2010  . Morbid obesity ([place]) 06/27/2010  . Impaired glucose tolerance 06/27/2010  . Disorder of bone and cartilage 06/22/2010  . Primary cardiomyopathy ([place]) 03/23/2010  . HYPERLIPIDEMIA-MIXED 03/07/2010  . Essential hypertension 03/07/2010  . CARDIOMEGALY 11/30/2009  . OTHER DYSPNEA AND RESPIRATORY ABNORMALITIES 11/30/2009  . AORTIC REGURGITATION 09/28/2009  . Chronic diastolic heart failure ([place]) 09/28/2009  . Aortic regurgitation 09/28/2009  . 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 Osteoarthrosis involving multiple sites 10/26/2008   Past Medical History:  Diagnosis Date  . Anginal pain ([place])    "small pain?nerves in chest"started 10/05/13   . Anginal pain ([place])    cleared by cardiology 3/15  . Anxiety    rx given recent not taken yet  . Aortic insufficiency    mild on 9/11 cath  . CAD (coronary artery disease)    nonobstructive let heart cath 3/11: 40% ostial D1, 30% mid CFX  . CHF (congestive heart failure) ([place])   . Colon polyps 02/01/2009    MULTIPLE FRAGMENTS OF TUBULAR ADENOMAS  . Diabetes mellitus   . Frozen shoulder   . GERD (gastroesophageal reflux disease)  occ  . History of blood transfusion    pregnancy  . HLD (hyperlipidemia)   . HTN (hypertension)    ACEI cough  . Hx of cardiovascular stress test    Lexiscan Myoview (10/2013):  Fixed ant defect most c/w breast attenuation, cannot exclude apical infarct; no ischemia, EF 46%; Low Risk  . Nonischemic cardiomyopathy ([place])    Ech 3/11 difficult study, moderate aortic insuficiency noted. Left evntriculogram 3/11  . Obese   . Osteoarthritis   . Palpitation    PACs noted on telemetry while in the hospital  . Pneumonia    hx  . Stress incontinence     Family History  Problem  Relation Age of Onset  . Heart attack Father   . Heart attack Sister   . Stroke Mother 70  . Diabetes Sister   . Heart disease Sister   . Diabetes Brother   . Colon cancer Brother   . Breast cancer Neg Hx     Past Surgical History:  Procedure Laterality Date  . ABDOMINAL HYSTERECTOMY    . APPENDECTOMY    . BLADDER NECK SUSPENSION  85  . CARDIAC CATHETERIZATION    . EYE SURGERY    . HAND SURGERY     right "had knots cut out"  . KNEE ARTHROPLASTY Right 12/09/2013   Procedure: COMPUTER ASSISTED Right TOTAL KNEE ARTHROPLASTY;  Surgeon: Marybelle Killings, MD;  Location: [facility];  Service: Orthopedics;  Laterality: Right;  . LEFT HEART CATH AND CORONARY ANGIOGRAPHY N/A 08/15/2017   Procedure: LEFT HEART CATH AND CORONARY ANGIOGRAPHY;  Surgeon: Jettie Booze, MD;  Location: [facility];  Service: Cardiovascular;  Laterality: N/A;  . TOTAL KNEE ARTHROPLASTY Left 05/13/2020   Procedure: LEFT TOTAL KNEE ARTHROPLASTY;  Surgeon: Marybelle Killings, MD;  Location: [facility];  Service: Orthopedics;  Laterality: Left;  . TUBAL LIGATION     Social History   Occupational History  . Not on file  Tobacco Use  . Smoking status: Former Smoker    Packs/day: 1.00    Years: 15.00    Pack years: 15.00    Types: Cigarettes    Quit date: 10/10/1983    Years since quitting: 36.7  . Smokeless tobacco: Never Used  . Tobacco comment: Quit 1970s  Vaping Use  . Vaping Use: Never used  Substance and Sexual Activity  . Alcohol use: No    Alcohol/week: 0.0 standard drinks  . Drug use: No  . Sexual activity: Not on file

## 2020-07-05 ENCOUNTER — Other Ambulatory Visit: Payer: Self-pay

## 2020-07-05 ENCOUNTER — Ambulatory Visit (INDEPENDENT_AMBULATORY_CARE_PROVIDER_SITE_OTHER): Payer: Medicare Other | Admitting: Physical Therapy

## 2020-07-05 ENCOUNTER — Encounter: Payer: Self-pay | Admitting: Physical Therapy

## 2020-07-05 ENCOUNTER — Ambulatory Visit: Payer: Medicare Other | Admitting: Physical Therapy

## 2020-07-05 DIAGNOSIS — M25662 Stiffness of left knee, not elsewhere classified: Secondary | ICD-10-CM | POA: Diagnosis not present

## 2020-07-05 DIAGNOSIS — R600 Localized edema: Secondary | ICD-10-CM | POA: Diagnosis not present

## 2020-07-05 DIAGNOSIS — M25562 Pain in left knee: Secondary | ICD-10-CM

## 2020-07-05 DIAGNOSIS — R262 Difficulty in walking, not elsewhere classified: Secondary | ICD-10-CM | POA: Diagnosis not present

## 2020-07-05 NOTE — Patient Instructions (Signed)
Access Code: [email or web address] URL: [email or web address] Date: 07/05/2020 Prepared by: Kearney Hard  Exercises Supine Bridge - 3 x daily - 7 x weekly - 2 sets - 10 reps Supine Active Straight Leg Raise - 3 x daily - 7 x weekly - 2 sets - 10 reps Supine Knee Extension Strengthening - 3 x daily - 7 x weekly - 2 sets - 10 reps - 5 seconds hold Supine Heel Slide with Strap - 3 x daily - 7 x weekly - 2 sets - 10 reps - 5-10 seconds hold

## 2020-07-05 NOTE — Therapy (Addendum)
[place], [place], [address] Phone: [phone]   Fax:  [phone]  Physical Therapy Evaluation  Patient Details  Name: Julie Graves MRN: [id number] Date of Birth: [birth year] Referring Provider (PT): Rodell Perna, MD   Encounter Date: 07/05/2020   PT End of Session - 07/05/20 1512    Visit Number 1    Number of Visits 16    Date for PT Re-Evaluation 09/02/20    Progress Note Due on Visit 10    PT Start Time 1430    PT Stop Time 1515    PT Time Calculation (min) 45 min    Activity Tolerance Patient tolerated treatment well    Behavior During Therapy [facility] for tasks assessed/performed           Past Medical History:  Diagnosis Date  . Anginal pain ([place])    "small pain?nerves in chest"started 10/05/13   . Anginal pain ([place])    cleared by cardiology 3/15  . Anxiety    rx given recent not taken yet  . Aortic insufficiency    mild on 9/11 cath  . CAD (coronary artery disease)    nonobstructive let heart cath 3/11: 40% ostial D1, 30% mid CFX  . CHF (congestive heart failure) ([place])   . Colon polyps 02/01/2009    MULTIPLE FRAGMENTS OF TUBULAR ADENOMAS  . Diabetes mellitus   . Frozen shoulder   . GERD (gastroesophageal reflux disease)    occ  . History of blood transfusion    pregnancy  . HLD (hyperlipidemia)   . HTN (hypertension)    ACEI cough  . Hx of cardiovascular stress test    Lexiscan Myoview (10/2013):  Fixed ant defect most c/w breast attenuation, cannot exclude apical infarct; no ischemia, EF 46%; Low Risk  . Nonischemic cardiomyopathy ([place])    Ech 3/11 difficult study, moderate aortic insuficiency noted. Left evntriculogram 3/11  . Obese   . Osteoarthritis   . Palpitation    PACs noted on telemetry while in the hospital  . Pneumonia    hx  . Stress incontinence     Past Surgical History:  Procedure Laterality Date  . ABDOMINAL HYSTERECTOMY    . APPENDECTOMY    . BLADDER NECK  SUSPENSION  85  . CARDIAC CATHETERIZATION    . EYE SURGERY    . HAND SURGERY     right "had knots cut out"  . KNEE ARTHROPLASTY Right 12/09/2013   Procedure: COMPUTER ASSISTED Right TOTAL KNEE ARTHROPLASTY;  Surgeon: Marybelle Killings, MD;  Location: [facility];  Service: Orthopedics;  Laterality: Right;  . LEFT HEART CATH AND CORONARY ANGIOGRAPHY N/A 08/15/2017   Procedure: LEFT HEART CATH AND CORONARY ANGIOGRAPHY;  Surgeon: Jettie Booze, MD;  Location: [place] [facility];  Service: Cardiovascular;  Laterality: N/A;  . TOTAL KNEE ARTHROPLASTY Left 05/13/2020   Procedure: LEFT TOTAL KNEE ARTHROPLASTY;  Surgeon: Marybelle Killings, MD;  Location: [place] [facility];  Service: Orthopedics;  Laterality: Left;  . TUBAL LIGATION      There were no vitals filed for this visit.    Subjective Assessment - 07/05/20 1440    Subjective Pt arriving to [facility]pay reporting 1/10 pain in L knee following her left TKA on 05/13/2020. Pt reporting 21 day stay in SNF followed by HHPT. Pt reporting stiffness in her L knee with increased swelling.    Pertinent History CAD, HTN, DM aortic insuffiency, hyperlipidemia OA, obesity, anxiety, h/o pneumonia, R TKA 2015, hysterectomy, heart  cath angiograph 2019.    How long can you stand comfortably? 10 minutes    How long can you walk comfortably? 5 minutes    Diagnostic tests x-ray    Patient Stated Goals be able to go up and down steps in house    Currently in Pain? Yes    Pain Score 0-No pain    Pain Location Knee   no pain at rests   Aggravating Factors  after exercise, at night when trying to sleep    Pain Relieving Factors tylenol, ice/elevation    Effect of Pain on Daily Activities difficulty with household chores and going up and down step              [facility] PT Assessment - 07/05/20 0001      Assessment   Medical Diagnosis L TKA D78.242    Referring Provider (PT) Rodell Perna, MD    Hand Dominance Left    Prior Therapy SNF and HHPT      Precautions   Precautions None       Restrictions   Weight Bearing Restrictions No      Balance Screen   Has the patient fallen in the past 6 months No    Is the patient reluctant to leave their home because of a fear of falling?  No      Home Social worker Private residence    Living Arrangements Other relatives;Spouse/significant other    Type of [place] to enter    Entrance Stairs-Number of Steps 1    [place] to live on main level with bedroom/bathroom;Two level      Prior Function   Level of Independence Independent    Vocation Retired    Leisure read, Geographical information systems officer and cards, church      Cognition   Overall Cognitive Status Within Functional Limits for tasks assessed      Observation/Other Assessments   Focus on Therapeutic Outcomes (FOTO)  38%function (predicted 57%)      Observation/Other Assessments-Edema    Edema Circumferential      Circumferential Edema   Circumferential - Right 49 centimeters    Circumferential - Left  54 centimeters      Posture/Postural Control   Posture/Postural Control Postural limitations    Postural Limitations Rounded Shoulders;Forward head;Increased lumbar lordosis      ROM / Strength   AROM / PROM / Strength AROM;Strength      AROM   AROM Assessment Site Knee    Right/Left Knee Right;Left    Right Knee Extension 0    Right Knee Flexion 50    Left Knee Extension 0    Left Knee Flexion 100      Strength   Overall Strength Deficits    Strength Assessment Site Knee    Right/Left Knee Right;Left    Right Knee Flexion 4/5    Right Knee Extension 4/5    Left Knee Flexion 2/5    Left Knee Extension 3/5      Transfers   Five time sit to stand comments  17 seconds with UE support      Ambulation/Gait   Gait Comments Pt amb with rolling walker with step throgh gait pattern with decreased step length and knee flexion on the left                      Objective measurements completed on  examination: See  above findings.       [place] Adult PT Treatment/Exercise - 07/05/20 0001      Exercises   Exercises Knee/Hip      Knee/Hip Exercises: Supine   Quad Sets Left;5 reps    Heel Slides Left;AAROM;AROM;10 reps    Heel Slides Limitations using strap    Bridges Strengthening;Both;10 reps    Straight Leg Raises Strengthening;Left;10 reps      Modalities   Modalities Vasopneumatic      Vasopneumatic   Number Minutes Vasopneumatic  10 minutes    Vasopnuematic Location  Knee    Vasopneumatic Pressure Low    Vasopneumatic Temperature  34                  PT Education - 07/05/20 1511    Education Details PT, POC and HEP    Person(s) Educated Patient    Methods Explanation;Demonstration;Handout    Comprehension Verbal cues required;Verbalized understanding;Need further instruction;Returned demonstration;Tactile cues required            PT Short Term Goals - 07/05/20 1530      PT SHORT TERM GOAL #1   Title Pt will be independent in her initial HEP.    Time 3    Period Weeks    Status New    Target Date 07/29/20      PT SHORT TERM GOAL #2   Title Pt will be able to perform sit to stand with no UE support    Time 3    Period Weeks    Status New    Target Date 07/29/20             PT Long Term Goals - 07/05/20 1531      PT LONG TERM GOAL #1   Title independent with HEP and progression    Time 8    Period Weeks    Status New    Target Date 09/02/20      PT LONG TERM GOAL #2   Title Pt will be albe to amb with no assistive device >/= 500 feet with pain </= 2/10.    Baseline amb with rolling walker short distances    Time 8    Period Weeks    Status New      PT LONG TERM GOAL #3   Title improve strength in RLE to at least 4/5 knee flex/ext for improved mobility.    Time 8    Period Weeks    Status New      PT LONG TERM GOAL #4   Title Pt will be albe to improve her L knee active  flexion to 100 degrees for improved mobility and gait.     Baseline AROM: 50 degrees    Time 8    Period Weeks    Status New      PT LONG TERM GOAL #5   Title Pt will improve her FOTO score to >/= 57% function.    Baseline 38% on 07/05/2020    Time 8    Period Weeks    Status New                  Plan - 07/05/20 1513    Clinical Impression Statement Pt arriving s/p left TKA on 05/13/2020. Pt with decreased strength, decresaed knee flexion to 70 degrees passively and 50 degrees actively and overall decreasd in funitonal mobillity. Pt reporting inability to sleep well at night due to pain and uable to climb her  flight of stairs in her home. 5 centimeters of swellling noted with pitting edema. Pt amb with rolling walker with antalgic gait pattern.  Skilled PT needed to address pt's impairments with the below inteventions.    Personal Factors and Comorbidities Comorbidity 3+    Comorbidities R TKA, CHF, anxiety, CAD, DM, HTN, OA, obesity, aortic insufficiency,    Examination-Activity Limitations Lift;Transfers;Stairs;Stand    Examination-Participation Restrictions Church;Other    Clinical Decision Making Low    Rehab Potential Good    PT Frequency 2x / week    PT Duration 8 weeks    PT Treatment/Interventions ADLs/Self Care Home Management;Cryotherapy;Electrical Stimulation;Ultrasound;Gait training;Stair training;Functional mobility training;Therapeutic activities;Therapeutic exercise;Balance training;Neuromuscular re-education;Patient/family education;Manual techniques;Scar mobilization;Passive range of motion    PT Next Visit Plan L knee ROM, strenghening, gait training, vaso    PT Home Exercise Plan Access Code: [email or web address]  URL: [email or web address]  Date: 07/05/2020  Prepared by: Kearney Hard    Exercises  Supine Bridge - 3 x daily - 7 x weekly - 2 sets - 10 reps  Supine Active Straight Leg Raise - 3 x daily - 7 x weekly - 2 sets - 10 reps  Supine Knee Extension Strengthening - 3 x daily - 7 x weekly - 2 sets - 10 reps - 5  seconds hold  Supine Heel Slide with Strap - 3 x daily - 7 x weekly - 2 sets - 10 reps - 5-10 seconds hold    Consulted and Agree with Plan of Care Patient           Patient will benefit from skilled therapeutic intervention in order to improve the following deficits and impairments:  Postural dysfunction,Decreased mobility,Decreased strength,Increased edema,Impaired flexibility,Decreased balance,Decreased activity tolerance,Pain,Obesity,Difficulty walking  Visit Diagnosis: Acute pain of left knee - Plan: PT plan of care cert/re-cert  Stiffness of left knee, not elsewhere classified - Plan: PT plan of care cert/re-cert  Difficulty in walking, not elsewhere classified - Plan: PT plan of care cert/re-cert  Localized edema - Plan: PT plan of care cert/re-cert     Problem List Patient Active Problem List   Diagnosis Date Noted  . Arthritis of left knee 05/13/2020  . S/P total knee arthroplasty, left 05/13/2020  . History of total knee arthroplasty, right 03/31/2020  . Precordial pain   . Angina pectoris ([place]) 08/13/2017  . Mixed dyslipidemia 08/13/2017  . CAD (coronary artery disease), native coronary artery 08/13/2017  . Mild episode of recurrent major depressive disorder ([place]) 11/03/2013  . Urge incontinence 04/02/2013  . Spinal stenosis, lumbar region, without neurogenic claudication 10/27/2012  . Spinal stenosis of lumbar region 10/01/2012  . Allergic rhinitis 09/26/2011  . Dizziness 01/23/2011  . Dyspnea 01/23/2011  . Type II diabetes mellitus ([place]) 06/27/2010  . Morbid obesity ([place]) 06/27/2010  . Impaired glucose tolerance 06/27/2010  . Disorder of bone and cartilage 06/22/2010  . Primary cardiomyopathy ([place]) 03/23/2010  . HYPERLIPIDEMIA-MIXED 03/07/2010  . Essential hypertension 03/07/2010  . CARDIOMEGALY 11/30/2009  . OTHER DYSPNEA AND RESPIRATORY ABNORMALITIES 11/30/2009  . AORTIC REGURGITATION 09/28/2009  . Chronic diastolic heart failure ([place]) 09/28/2009  .  Aortic regurgitation 09/28/2009  . 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 Osteoarthrosis involving multiple sites 10/26/2008    Oretha Caprice, PT, MPT 07/05/2020, 4:43 PM  [facility] Physical Therapy [address] [place], [place], [address] Phone: [phone]   Fax:  [phone]  Name: Julie Graves MRN: [id number] Date of Birth: 1941/02/20

## 2020-07-12 ENCOUNTER — Encounter: Payer: Medicare Other | Admitting: Physical Therapy

## 2020-07-13 ENCOUNTER — Ambulatory Visit (INDEPENDENT_AMBULATORY_CARE_PROVIDER_SITE_OTHER): Payer: Medicare Other | Admitting: Physical Therapy

## 2020-07-13 ENCOUNTER — Encounter: Payer: Self-pay | Admitting: Physical Therapy

## 2020-07-13 ENCOUNTER — Other Ambulatory Visit: Payer: Self-pay

## 2020-07-13 DIAGNOSIS — R262 Difficulty in walking, not elsewhere classified: Secondary | ICD-10-CM | POA: Diagnosis not present

## 2020-07-13 DIAGNOSIS — M25562 Pain in left knee: Secondary | ICD-10-CM | POA: Diagnosis not present

## 2020-07-13 DIAGNOSIS — M25662 Stiffness of left knee, not elsewhere classified: Secondary | ICD-10-CM | POA: Diagnosis not present

## 2020-07-13 DIAGNOSIS — R600 Localized edema: Secondary | ICD-10-CM | POA: Diagnosis not present

## 2020-07-13 NOTE — Patient Instructions (Signed)
Access Code: [email or web address] URL: [email or web address] Date: 07/13/2020 Prepared by: Jamey Reas  Exercises Supine Bridge - 3 x daily - 7 x weekly - 2 sets - 10 reps Supine Active Straight Leg Raise - 3 x daily - 7 x weekly - 2 sets - 10 reps Supine Knee Extension Strengthening - 3 x daily - 7 x weekly - 2 sets - 10 reps - 5 seconds hold Supine Heel Slide with Strap - 3 x daily - 7 x weekly - 2 sets - 10 reps - 5-10 seconds hold Quad Setting and Stretching - 3 x daily - 7 x weekly - 2 sets - 10 reps - 5 min propping quad set 5 seconds hold Seated Knee Flexion Extension AROM - 3 x daily - 7 x weekly - 2 sets - 10 reps - 5 seconds hold Seated Hamstring Stretch - 2 x daily - 7 x weekly - 1 sets - 3 reps - 20-30 seconds hold Seated Gastroc Stretch with Strap - 2 x daily - 7 x weekly - 1 sets - 3 reps - 20-30 seconds hold

## 2020-07-13 NOTE — Therapy (Signed)
[place], [place], [address] Phone: [phone]   Fax:  [phone]  Physical Therapy Treatment  Patient Details  Name: Julie Graves MRN: [id number] Date of Birth: January 27, 1941 Referring Provider (PT): Rodell Perna, MD   Encounter Date: 07/13/2020   PT End of Session - 07/13/20 1357    Visit Number 2    Number of Visits 16    Date for PT Re-Evaluation 09/02/20    Progress Note Due on Visit 10    PT Start Time 1351    PT Stop Time 1445    PT Time Calculation (min) 54 min    Activity Tolerance Patient tolerated treatment well    Behavior During Therapy [facility] for tasks assessed/performed           Past Medical History:  Diagnosis Date  . Anginal pain ([place])    "small pain?nerves in chest"started 10/05/13   . Anginal pain ([place])    cleared by cardiology 3/15  . Anxiety    rx given recent not taken yet  . Aortic insufficiency    mild on 9/11 cath  . CAD (coronary artery disease)    nonobstructive let heart cath 3/11: 40% ostial D1, 30% mid CFX  . CHF (congestive heart failure) ([place])   . Colon polyps 02/01/2009    MULTIPLE FRAGMENTS OF TUBULAR ADENOMAS  . Diabetes mellitus   . Frozen shoulder   . GERD (gastroesophageal reflux disease)    occ  . History of blood transfusion    pregnancy  . HLD (hyperlipidemia)   . HTN (hypertension)    ACEI cough  . Hx of cardiovascular stress test    Lexiscan Myoview (10/2013):  Fixed ant defect most c/w breast attenuation, cannot exclude apical infarct; no ischemia, EF 46%; Low Risk  . Nonischemic cardiomyopathy ([place])    Ech 3/11 difficult study, moderate aortic insuficiency noted. Left evntriculogram 3/11  . Obese   . Osteoarthritis   . Palpitation    PACs noted on telemetry while in the hospital  . Pneumonia    hx  . Stress incontinence     Past Surgical History:  Procedure Laterality Date  . ABDOMINAL HYSTERECTOMY    . APPENDECTOMY    . BLADDER NECK SUSPENSION   85  . CARDIAC CATHETERIZATION    . EYE SURGERY    . HAND SURGERY     right "had knots cut out"  . KNEE ARTHROPLASTY Right 12/09/2013   Procedure: COMPUTER ASSISTED Right TOTAL KNEE ARTHROPLASTY;  Surgeon: Marybelle Killings, MD;  Location: [facility];  Service: Orthopedics;  Laterality: Right;  . LEFT HEART CATH AND CORONARY ANGIOGRAPHY N/A 08/15/2017   Procedure: LEFT HEART CATH AND CORONARY ANGIOGRAPHY;  Surgeon: Jettie Booze, MD;  Location: [place] [facility];  Service: Cardiovascular;  Laterality: N/A;  . TOTAL KNEE ARTHROPLASTY Left 05/13/2020   Procedure: LEFT TOTAL KNEE ARTHROPLASTY;  Surgeon: Marybelle Killings, MD;  Location: [place];  Service: Orthopedics;  Laterality: Left;  . TUBAL LIGATION      There were no vitals filed for this visit.   Subjective Assessment - 07/13/20 1350    Subjective She has been trying to do her exercises but bending is hard.    Pertinent History CAD, HTN, DM aortic insuffiency, hyperlipidemia OA, obesity, anxiety, h/o pneumonia, R TKA 2015, hysterectomy, heart cath angiograph 2019.    How long can you stand comfortably? 10 minutes    How long can you walk comfortably? 5 minutes  Diagnostic tests x-ray    Patient Stated Goals be able to go up and down steps in house    Currently in Pain? Yes    Pain Score 0-No pain   at night knee 10/10   Pain Location Knee    Pain Orientation Left    Pain Descriptors / Indicators Aching;Sore    Pain Type Acute pain;Surgical pain    Pain Onset 1 to 4 weeks ago    Pain Frequency Intermittent    Aggravating Factors  swelling,    Pain Relieving Factors medication, ice              [facility] PT Assessment - 07/13/20 0001      Assessment   Medical Diagnosis L TKA [id number]    Referring Provider (PT) Rodell Perna, MD      ROM / Strength   AROM / PROM / Strength PROM      PROM   Right/Left Knee Left    Left Knee Extension -3   seated   Left Knee Flexion 79   seated                        [facility] Adult  PT Treatment/Exercise - 07/13/20 1351      Self-Care   Self-Care ADL's    ADL's PT demo's on pt & verbal cues on positioning in sidelying with pillow between LEs. Pt verbalized understanding & reports felt better. She will attempt at night.      Knee/Hip Exercises: Stretches   Active Hamstring Stretch Left;2 reps;20 seconds    Active Hamstring Stretch Limitations seated with LE extended & trunk lean    Gastroc Stretch Left;2 reps;20 seconds    Gastroc Stretch Limitations seated LE extended with strap DF      Knee/Hip Exercises: Aerobic   Nustep seat 5 BUEs & BLEs level 7 for 8 min cues on full range      Knee/Hip Exercises: Seated   Heel Slides AROM;Left;2 sets;10 reps      Knee/Hip Exercises: Supine   Quad Sets AROM;Left;2 sets;10 reps    Quad Sets Limitations ankle propped on bolster for knee extension    Short Arc Quad Sets AROM;Strengthening;Left;2 sets;10 reps    Heel Slides AAROM;Left;2 sets;10 reps    Heel Slides Limitations using strap & foot on ball    Bridges Strengthening;Both;2 sets;10 reps    Bridges Limitations tactile & verbal cues on technique    Straight Leg Raises Strengthening;Left;2 sets;10 reps    Straight Leg Raises Limitations verbal cues on technique      Modalities   Modalities Vasopneumatic      Vasopneumatic   Number Minutes Vasopneumatic  10 minutes    Vasopnuematic Location  Knee    Vasopneumatic Pressure Medium    Vasopneumatic Temperature  34                    PT Short Term Goals - 07/05/20 1530      PT SHORT TERM GOAL #1   Title Pt will be independent in her initial HEP.    Time 3    Period Weeks    Status New    Target Date 07/29/20      PT SHORT TERM GOAL #2   Title Pt will be able to perform sit to stand with no UE support    Time 3    Period Weeks    Status New    Target Date  07/29/20             PT Long Term Goals - 07/05/20 1531      PT LONG TERM GOAL #1   Title independent with HEP and progression     Time 8    Period Weeks    Status New    Target Date 09/02/20      PT LONG TERM GOAL #2   Title Pt will be albe to amb with no assistive device >/= 500 feet with pain </= 2/10.    Baseline amb with rolling walker short distances    Time 8    Period Weeks    Status New      PT LONG TERM GOAL #3   Title improve strength in RLE to at least 4/5 knee flex/ext for improved mobility.    Time 8    Period Weeks    Status New      PT LONG TERM GOAL #4   Title Pt will be albe to improve her L knee active  flexion to 100 degrees for improved mobility and gait.    Baseline AROM: 50 degrees    Time 8    Period Weeks    Status New      PT LONG TERM GOAL #5   Title Pt will improve her FOTO score to >/= 57% function.    Baseline 38% on 07/05/2020    Time 8    Period Weeks    Status New                 Plan - 07/13/20 1357    Clinical Impression Statement PT updated HEP to include quad sets & stretch and seated exercises.  Pt appears to understand but limits her exercises if they are uncomfortable.  She reports pain interupting her sleep. She typically sleeps on her side. PT instructed in positioning with pillow to improve her sleep.    Personal Factors and Comorbidities Comorbidity 3+    Comorbidities R TKA, CHF, anxiety, CAD, DM, HTN, OA, obesity, aortic insufficiency,    Examination-Activity Limitations Lift;Transfers;Stairs;Stand    Examination-Participation Restrictions Church;Other    Rehab Potential Good    PT Frequency 2x / week    PT Duration 8 weeks    PT Treatment/Interventions ADLs/Self Care Home Management;Cryotherapy;Electrical Stimulation;Ultrasound;Gait training;Stair training;Functional mobility training;Therapeutic activities;Therapeutic exercise;Balance training;Neuromuscular re-education;Patient/family education;Manual techniques;Scar mobilization;Passive range of motion    PT Next Visit Plan L knee ROM, strenghening, gait training, vaso    PT Home Exercise Plan  Access Code: [email or web address]    Consulted and Agree with Plan of Care Patient           Patient will benefit from skilled therapeutic intervention in order to improve the following deficits and impairments:  Postural dysfunction,Decreased mobility,Decreased strength,Increased edema,Impaired flexibility,Decreased balance,Decreased activity tolerance,Pain,Obesity,Difficulty walking  Visit Diagnosis: Acute pain of left knee  Stiffness of left knee, not elsewhere classified  Difficulty in walking, not elsewhere classified  Localized edema     Problem List Patient Active Problem List   Diagnosis Date Noted  . Arthritis of left knee 05/13/2020  . S/P total knee arthroplasty, left 05/13/2020  . History of total knee arthroplasty, right 03/31/2020  . Precordial pain   . Angina pectoris ([place]) 08/13/2017  . Mixed dyslipidemia 08/13/2017  . CAD (coronary artery disease), native coronary artery 08/13/2017  . Mild episode of recurrent major depressive disorder ([place]) 11/03/2013  . Urge incontinence 04/02/2013  . Spinal stenosis, lumbar region, without neurogenic claudication  10/27/2012  . Spinal stenosis of lumbar region 10/01/2012  . Allergic rhinitis 09/26/2011  . Dizziness 01/23/2011  . Dyspnea 01/23/2011  . Type II diabetes mellitus ([place]) 06/27/2010  . Morbid obesity ([place]) 06/27/2010  . Impaired glucose tolerance 06/27/2010  . Disorder of bone and cartilage 06/22/2010  . Primary cardiomyopathy ([place]) 03/23/2010  . HYPERLIPIDEMIA-MIXED 03/07/2010  . Essential hypertension 03/07/2010  . CARDIOMEGALY 11/30/2009  . OTHER DYSPNEA AND RESPIRATORY ABNORMALITIES 11/30/2009  . AORTIC REGURGITATION 09/28/2009  . Chronic diastolic heart failure ([place]) 09/28/2009  . Aortic regurgitation 09/28/2009  . 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 Osteoarthrosis involving multiple sites 10/26/2008    Jamey Reas PT, DPT 07/13/2020, 2:49 PM  [facility] Physical Therapy [address] [place], [place], [address] Phone: [phone]   Fax:  [phone]  Name: AMAYAH CRIVELLI MRN: [id number] Date of Birth: April 06, 1941

## 2020-07-14 ENCOUNTER — Ambulatory Visit (INDEPENDENT_AMBULATORY_CARE_PROVIDER_SITE_OTHER): Payer: Medicare Other | Admitting: Physical Therapy

## 2020-07-14 DIAGNOSIS — R262 Difficulty in walking, not elsewhere classified: Secondary | ICD-10-CM | POA: Diagnosis not present

## 2020-07-14 DIAGNOSIS — M25662 Stiffness of left knee, not elsewhere classified: Secondary | ICD-10-CM

## 2020-07-14 DIAGNOSIS — R600 Localized edema: Secondary | ICD-10-CM | POA: Diagnosis not present

## 2020-07-14 DIAGNOSIS — M25562 Pain in left knee: Secondary | ICD-10-CM

## 2020-07-14 NOTE — Therapy (Signed)
[place], [place], [address] Phone: [phone]   Fax:  [phone]  Physical Therapy Treatment  Patient Details  Name: Julie Graves MRN: [id number] Date of Birth: [birth year] Referring Provider (PT): Rodell Perna, MD   Encounter Date: 07/14/2020   PT End of Session - 07/14/20 1440    Visit Number 3    Number of Visits 16    Date for PT Re-Evaluation 09/02/20    Progress Note Due on Visit 10    PT Start Time [id number]    PT Stop Time 1517    PT Time Calculation (min) 43 min    Activity Tolerance Patient tolerated treatment well    Behavior During Therapy [facility] for tasks assessed/performed           Past Medical History:  Diagnosis Date  . Anginal pain ([place])    "small pain?nerves in chest"started 10/05/13   . Anginal pain ([place])    cleared by cardiology 3/15  . Anxiety    rx given recent not taken yet  . Aortic insufficiency    mild on 9/11 cath  . CAD (coronary artery disease)    nonobstructive let heart cath 3/11: 40% ostial D1, 30% mid CFX  . CHF (congestive heart failure) ([place])   . Colon polyps 02/01/2009    MULTIPLE FRAGMENTS OF TUBULAR ADENOMAS  . Diabetes mellitus   . Frozen shoulder   . GERD (gastroesophageal reflux disease)    occ  . History of blood transfusion    pregnancy  . HLD (hyperlipidemia)   . HTN (hypertension)    ACEI cough  . Hx of cardiovascular stress test    Lexiscan Myoview (10/2013):  Fixed ant defect most c/w breast attenuation, cannot exclude apical infarct; no ischemia, EF 46%; Low Risk  . Nonischemic cardiomyopathy ([place])    Ech 3/11 difficult study, moderate aortic insuficiency noted. Left evntriculogram 3/11  . Obese   . Osteoarthritis   . Palpitation    PACs noted on telemetry while in the hospital  . Pneumonia    hx  . Stress incontinence     Past Surgical History:  Procedure Laterality Date  . ABDOMINAL HYSTERECTOMY    . APPENDECTOMY    . BLADDER NECK SUSPENSION   85  . CARDIAC CATHETERIZATION    . EYE SURGERY    . HAND SURGERY     right "had knots cut out"  . KNEE ARTHROPLASTY Right 12/09/2013   Procedure: COMPUTER ASSISTED Right TOTAL KNEE ARTHROPLASTY;  Surgeon: Marybelle Killings, MD;  Location: [place];  Service: Orthopedics;  Laterality: Right;  . LEFT HEART CATH AND CORONARY ANGIOGRAPHY N/A 08/15/2017   Procedure: LEFT HEART CATH AND CORONARY ANGIOGRAPHY;  Surgeon: Jettie Booze, MD;  Location: [place] [facility];  Service: Cardiovascular;  Laterality: N/A;  . TOTAL KNEE ARTHROPLASTY Left 05/13/2020   Procedure: LEFT TOTAL KNEE ARTHROPLASTY;  Surgeon: Marybelle Killings, MD;  Location: [facility];  Service: Orthopedics;  Laterality: Left;  . TUBAL LIGATION      There were no vitals filed for this visit.   Subjective Assessment - 07/14/20 1439    Subjective She relays no pain upon arrivial at rest but pain with stretching and that stiffness and the weather are her biggest complaints for her Rt knee    Pertinent History CAD, HTN, DM aortic insuffiency, hyperlipidemia OA, obesity, anxiety, h/o pneumonia, R TKA 2015, hysterectomy, heart cath angiograph 2019.    How long can you stand  comfortably? 10 minutes    How long can you walk comfortably? 5 minutes    Diagnostic tests x-ray    Patient Stated Goals be able to go up and down steps in house    Pain Onset 1 to 4 weeks ago            [facility] Adult PT Treatment/Exercise - 07/14/20 0001      Knee/Hip Exercises: Stretches   Active Hamstring Stretch Left;3 reps;20 seconds    Active Hamstring Stretch Limitations seated with LE extended & trunk lean    Knee: Self-Stretch Limitations seated tailgate stretch for flexion with self O    Gastroc Stretch Left;3 reps;30 seconds    Gastroc Stretch Limitations seated LE extended with strap DF      Knee/Hip Exercises: Standing   Other Standing Knee Exercises in // bars: fwd walking and lateral walking without UE support 2 round trips, march walking with bilat UE  support 2 round trips      Knee/Hip Exercises: Seated   Sit to Sand 10 reps;without UE support   21 inch     Knee/Hip Exercises: Supine   Quad Sets AROM;Left;2 sets;10 reps    Quad Sets Limitations ankle propped for extension    Short Arc Quad Sets AROM;Strengthening;Left;2 sets;10 reps      Vasopneumatic   Number Minutes Vasopneumatic  10 minutes    Vasopnuematic Location  Knee    Vasopneumatic Pressure Medium    Vasopneumatic Temperature  34      Manual Therapy   Manual therapy comments Lt knee PROM flexion and extension 10 min to tolreance                    PT Short Term Goals - 07/05/20 1530      PT SHORT TERM GOAL #1   Title Pt will be independent in her initial HEP.    Time 3    Period Weeks    Status New    Target Date 07/29/20      PT SHORT TERM GOAL #2   Title Pt will be able to perform sit to stand with no UE support    Time 3    Period Weeks    Status New    Target Date 07/29/20             PT Long Term Goals - 07/05/20 1531      PT LONG TERM GOAL #1   Title independent with HEP and progression    Time 8    Period Weeks    Status New    Target Date 09/02/20      PT LONG TERM GOAL #2   Title Pt will be albe to amb with no assistive device >/= 500 feet with pain </= 2/10.    Baseline amb with rolling walker short distances    Time 8    Period Weeks    Status New      PT LONG TERM GOAL #3   Title improve strength in RLE to at least 4/5 knee flex/ext for improved mobility.    Time 8    Period Weeks    Status New      PT LONG TERM GOAL #4   Title Pt will be albe to improve her L knee active  flexion to 100 degrees for improved mobility and gait.    Baseline AROM: 50 degrees    Time 8    Period Weeks    Status  New      PT LONG TERM GOAL #5   Title Pt will improve her FOTO score to >/= 57% function.    Baseline 38% on 07/05/2020    Time 8    Period Weeks    Status New                 Plan - 07/14/20 1509    Clinical  Impression Statement Session focused on Lt knee ROM, strength, and gait/standing tolerance today. She had good overall tolerance to exercises and fair tolerance to manual PROM with some guarding noted. PT will continue to progress as able.    Personal Factors and Comorbidities Comorbidity 3+    Comorbidities R TKA, CHF, anxiety, CAD, DM, HTN, OA, obesity, aortic insufficiency,    Examination-Activity Limitations Lift;Transfers;Stairs;Stand    Examination-Participation Restrictions Church;Other    Rehab Potential Good    PT Frequency 2x / week    PT Duration 8 weeks    PT Treatment/Interventions ADLs/Self Care Home Management;Cryotherapy;Electrical Stimulation;Ultrasound;Gait training;Stair training;Functional mobility training;Therapeutic activities;Therapeutic exercise;Balance training;Neuromuscular re-education;Patient/family education;Manual techniques;Scar mobilization;Passive range of motion    PT Next Visit Plan L knee ROM, strenghening, gait training, vaso    PT Home Exercise Plan Access Code: [email or web address]    Consulted and Agree with Plan of Care Patient           Patient will benefit from skilled therapeutic intervention in order to improve the following deficits and impairments:  Postural dysfunction,Decreased mobility,Decreased strength,Increased edema,Impaired flexibility,Decreased balance,Decreased activity tolerance,Pain,Obesity,Difficulty walking  Visit Diagnosis: Acute pain of left knee  Stiffness of left knee, not elsewhere classified  Difficulty in walking, not elsewhere classified  Localized edema     Problem List Patient Active Problem List   Diagnosis Date Noted  . Arthritis of left knee 05/13/2020  . S/P total knee arthroplasty, left 05/13/2020  . History of total knee arthroplasty, right 03/31/2020  . Precordial pain   . Angina pectoris ([place]) 08/13/2017  . Mixed dyslipidemia 08/13/2017  . CAD (coronary artery disease), native coronary artery 08/13/2017  .  Mild episode of recurrent major depressive disorder ([place]) 11/03/2013  . Urge incontinence 04/02/2013  . Spinal stenosis, lumbar region, without neurogenic claudication 10/27/2012  . Spinal stenosis of lumbar region 10/01/2012  . Allergic rhinitis 09/26/2011  . Dizziness 01/23/2011  . Dyspnea 01/23/2011  . Type II diabetes mellitus ([place]) 06/27/2010  . Morbid obesity ([place]) 06/27/2010  . Impaired glucose tolerance 06/27/2010  . Disorder of bone and cartilage 06/22/2010  . Primary cardiomyopathy ([place]) 03/23/2010  . HYPERLIPIDEMIA-MIXED 03/07/2010  . Essential hypertension 03/07/2010  . CARDIOMEGALY 11/30/2009  . OTHER DYSPNEA AND RESPIRATORY ABNORMALITIES 11/30/2009  . AORTIC REGURGITATION 09/28/2009  . Chronic diastolic heart failure ([place]) 09/28/2009  . Aortic regurgitation 09/28/2009  . 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 Osteoarthrosis involving multiple sites 10/26/2008    Silvestre Mesi 07/14/2020, 3:10 PM  [facility] Physical Therapy [address] [place], [place], [address] Phone: [phone]   Fax:  [phone]  Name: JAZELL ROSENAU MRN: [id number] Date of Birth: 1941/05/08

## 2020-07-19 ENCOUNTER — Encounter: Payer: Self-pay | Admitting: Physical Therapy

## 2020-07-19 ENCOUNTER — Other Ambulatory Visit: Payer: Self-pay

## 2020-07-19 ENCOUNTER — Ambulatory Visit (INDEPENDENT_AMBULATORY_CARE_PROVIDER_SITE_OTHER): Payer: Medicare Other | Admitting: Physical Therapy

## 2020-07-19 DIAGNOSIS — M25662 Stiffness of left knee, not elsewhere classified: Secondary | ICD-10-CM | POA: Diagnosis not present

## 2020-07-19 DIAGNOSIS — R262 Difficulty in walking, not elsewhere classified: Secondary | ICD-10-CM | POA: Diagnosis not present

## 2020-07-19 DIAGNOSIS — M25562 Pain in left knee: Secondary | ICD-10-CM | POA: Diagnosis not present

## 2020-07-19 DIAGNOSIS — R600 Localized edema: Secondary | ICD-10-CM

## 2020-07-19 NOTE — Therapy (Signed)
[place], [place], [address] Phone: [phone]   Fax:  [phone]  Physical Therapy Treatment  Patient Details  Name: Julie Graves MRN: [id number] Date of Birth: 1941-04-29 Referring Provider (PT): Rodell Perna, MD   Encounter Date: 07/19/2020   PT End of Session - 07/19/20 1523    Visit Number 5    Number of Visits 16    Date for PT Re-Evaluation 09/02/20    Progress Note Due on Visit 10    PT Start Time 1518    PT Stop Time 1610    PT Time Calculation (min) 52 min    Activity Tolerance Patient tolerated treatment well    Behavior During Therapy [facility] for tasks assessed/performed           Past Medical History:  Diagnosis Date  . Anginal pain ([place])    "small pain?nerves in chest"started 10/05/13   . Anginal pain ([place])    cleared by cardiology 3/15  . Anxiety    rx given recent not taken yet  . Aortic insufficiency    mild on 9/11 cath  . CAD (coronary artery disease)    nonobstructive let heart cath 3/11: 40% ostial D1, 30% mid CFX  . CHF (congestive heart failure) ([place])   . Colon polyps 02/01/2009    MULTIPLE FRAGMENTS OF TUBULAR ADENOMAS  . Diabetes mellitus   . Frozen shoulder   . GERD (gastroesophageal reflux disease)    occ  . History of blood transfusion    pregnancy  . HLD (hyperlipidemia)   . HTN (hypertension)    ACEI cough  . Hx of cardiovascular stress test    Lexiscan Myoview (10/2013):  Fixed ant defect most c/w breast attenuation, cannot exclude apical infarct; no ischemia, EF 46%; Low Risk  . Nonischemic cardiomyopathy ([place])    Ech 3/11 difficult study, moderate aortic insuficiency noted. Left evntriculogram 3/11  . Obese   . Osteoarthritis   . Palpitation    PACs noted on telemetry while in the hospital  . Pneumonia    hx  . Stress incontinence     Past Surgical History:  Procedure Laterality Date  . ABDOMINAL HYSTERECTOMY    . APPENDECTOMY    . BLADDER NECK SUSPENSION   85  . CARDIAC CATHETERIZATION    . EYE SURGERY    . HAND SURGERY     right "had knots cut out"  . KNEE ARTHROPLASTY Right 12/09/2013   Procedure: COMPUTER ASSISTED Right TOTAL KNEE ARTHROPLASTY;  Surgeon: Marybelle Killings, MD;  Location: [place];  Service: Orthopedics;  Laterality: Right;  . LEFT HEART CATH AND CORONARY ANGIOGRAPHY N/A 08/15/2017   Procedure: LEFT HEART CATH AND CORONARY ANGIOGRAPHY;  Surgeon: Jettie Booze, MD;  Location: [place] [facility];  Service: Cardiovascular;  Laterality: N/A;  . TOTAL KNEE ARTHROPLASTY Left 05/13/2020   Procedure: LEFT TOTAL KNEE ARTHROPLASTY;  Surgeon: Marybelle Killings, MD;  Location: [place];  Service: Orthopedics;  Laterality: Left;  . TUBAL LIGATION      There were no vitals filed for this visit.   Subjective Assessment - 07/19/20 1520    Subjective She has been doing her exercises & trying to bend knee as much as she can.    Pertinent History CAD, HTN, DM aortic insuffiency, hyperlipidemia OA, obesity, anxiety, h/o pneumonia, R TKA 2015, hysterectomy, heart cath angiograph 2019.    How long can you stand comfortably? 10 minutes    How long can you  walk comfortably? 5 minutes    Diagnostic tests x-ray    Patient Stated Goals be able to go up and down steps in house    Currently in Pain? Yes    Pain Score 5     Pain Location Knee    Pain Orientation Left    Pain Descriptors / Indicators Aching;Sore;Tightness    Pain Type Surgical pain;Acute pain    Pain Onset 1 to 4 weeks ago    Pain Frequency Intermittent    Aggravating Factors  bending knee    Pain Relieving Factors resting              [facility] PT Assessment - 07/19/20 1520      Assessment   Medical Diagnosis L TKA I33.825    Referring Provider (PT) Rodell Perna, MD      PROM   Left Knee Extension -2   supine   Left Knee Flexion 89   supine                        [facility] Adult PT Treatment/Exercise - 07/19/20 1520      Ambulation/Gait   Ambulation/Gait Yes     Ambulation/Gait Assistance 5: Supervision    Ambulation Distance (Feet) 10 Feet   10' X 4 in //bars & 10' X 1 outside bars   Assistive device None   arrives & exits with RW   Gait Pattern Antalgic;Lateral hip instability    Ambulation Surface Level;Indoor      Knee/Hip Exercises: [employer] operational officer Left;3 reps;20 seconds    Active Hamstring Stretch Limitations seated with LE extended & trunk lean    Knee: Self-Stretch Limitations seated tailgate stretch for flexion with self O    Gastroc Stretch Left;3 reps;30 seconds    Gastroc Stretch Limitations seated LE extended with strap DF      Knee/Hip Exercises: Aerobic   Recumbent Bike seat 2 rocking back & forth with 5 sec hold for max flexion for 8 minutes      Knee/Hip Exercises: Standing   Rocker Board 1 minute   60 sec ant/mid/post & 60 sec right/mid/left   Rocker Board Limitations BUE support on //bars & mirror for feedback with verbal cues    Other Standing Knee Exercises in // bars: fwd walking and lateral walking without UE support 2 round trips, march forward & backward walking with bilat UE support 2 round trips    Other Standing Knee Exercises tandem stance on foam beam 60 sec LLE in front & 60 sec LLE in back      Knee/Hip Exercises: Seated   Long Arc Quad AROM;Left;2 sets;10 reps    Long Arc Quad Limitations alternating ankle PF & DF to assist edema    Sit to Sand 10 reps;without UE support   21 inch     Knee/Hip Exercises: Supine   Quad Sets AROM;Left;2 sets;10 reps    Short Arc [employer] AROM;Strengthening;Left;2 sets;10 reps    Short Arc [employer] Limitations 3# cues on speed & controlled eccentric    Heel Slides AAROM;Left;2 sets;10 reps    Heel Slides Limitations using strap & foot on ball    Straight Leg Raises Strengthening;Left;2 sets;10 reps    Straight Leg Raises Limitations verbal cues on technique      Vasopneumatic   Number Minutes Vasopneumatic  10 minutes    Vasopnuematic Location  Knee     Vasopneumatic Pressure Medium  Vasopneumatic Temperature  34      Manual Therapy   Manual therapy comments Lt knee PROM flexion and extension 8 min to tolreance                    PT Short Term Goals - 07/05/20 1530      PT SHORT TERM GOAL #1   Title Pt will be independent in her initial HEP.    Time 3    Period Weeks    Status New    Target Date 07/29/20      PT SHORT TERM GOAL #2   Title Pt will be able to perform sit to stand with no UE support    Time 3    Period Weeks    Status New    Target Date 07/29/20             PT Long Term Goals - 07/05/20 1531      PT LONG TERM GOAL #1   Title independent with HEP and progression    Time 8    Period Weeks    Status New    Target Date 09/02/20      PT LONG TERM GOAL #2   Title Pt will be albe to amb with no assistive device >/= 500 feet with pain </= 2/10.    Baseline amb with rolling walker short distances    Time 8    Period Weeks    Status New      PT LONG TERM GOAL #3   Title improve strength in RLE to at least 4/5 knee flex/ext for improved mobility.    Time 8    Period Weeks    Status New      PT LONG TERM GOAL #4   Title Pt will be albe to improve her L knee active  flexion to 100 degrees for improved mobility and gait.    Baseline AROM: 50 degrees    Time 8    Period Weeks    Status New      PT LONG TERM GOAL #5   Title Pt will improve her FOTO score to >/= 57% function.    Baseline 38% on 07/05/2020    Time 8    Period Weeks    Status New                 Plan - 07/19/20 1523    Clinical Impression Statement Patient's PROM has improved.  PT progressed exercises to include more standing exercises.  Patient was able to ambulate short distance 10' without device but has antalgic pattern.    Personal Factors and Comorbidities Comorbidity 3+    Comorbidities R TKA, CHF, anxiety, CAD, DM, HTN, OA, obesity, aortic insufficiency,    Examination-Activity Limitations  Lift;Transfers;Stairs;Stand    Examination-Participation Restrictions Church;Other    Rehab Potential Good    PT Frequency 2x / week    PT Duration 8 weeks    PT Treatment/Interventions ADLs/Self Care Home Management;Cryotherapy;Electrical Stimulation;Ultrasound;Gait training;Stair training;Functional mobility training;Therapeutic activities;Therapeutic exercise;Balance training;Neuromuscular re-education;Patient/family education;Manual techniques;Scar mobilization;Passive range of motion    PT Next Visit Plan L knee ROM, strenghening, gait training, vaso    PT Home Exercise Plan Access Code: [email or web address]    Consulted and Agree with Plan of Care Patient           Patient will benefit from skilled therapeutic intervention in order to improve the following deficits and impairments:  Postural dysfunction,Decreased mobility,Decreased strength,Increased edema,Impaired flexibility,Decreased balance,Decreased  activity tolerance,Pain,Obesity,Difficulty walking  Visit Diagnosis: Acute pain of left knee  Stiffness of left knee, not elsewhere classified  Difficulty in walking, not elsewhere classified  Localized edema     Problem List Patient Active Problem List   Diagnosis Date Noted  . Arthritis of left knee 05/13/2020  . S/P total knee arthroplasty, left 05/13/2020  . History of total knee arthroplasty, right 03/31/2020  . Precordial pain   . Angina pectoris ([place]) 08/13/2017  . Mixed dyslipidemia 08/13/2017  . CAD (coronary artery disease), native coronary artery 08/13/2017  . Mild episode of recurrent major depressive disorder ([place]) 11/03/2013  . Urge incontinence 04/02/2013  . Spinal stenosis, lumbar region, without neurogenic claudication 10/27/2012  . Spinal stenosis of lumbar region 10/01/2012  . Allergic rhinitis 09/26/2011  . Dizziness 01/23/2011  . Dyspnea 01/23/2011  . Type II diabetes mellitus ([place]) 06/27/2010  . Morbid obesity ([place]) 06/27/2010  . Impaired glucose  tolerance 06/27/2010  . Disorder of bone and cartilage 06/22/2010  . Primary cardiomyopathy ([place]) 03/23/2010  . HYPERLIPIDEMIA-MIXED 03/07/2010  . Essential hypertension 03/07/2010  . CARDIOMEGALY 11/30/2009  . OTHER DYSPNEA AND RESPIRATORY ABNORMALITIES 11/30/2009  . AORTIC REGURGITATION 09/28/2009  . Chronic diastolic heart failure ([place]) 09/28/2009  . Aortic regurgitation 09/28/2009  . 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 Osteoarthrosis involving multiple sites 10/26/2008    Jamey Reas, PT, DPT 07/19/2020, 4:05 PM  [facility] Physical Therapy [address] [place], [place], [address] Phone: [phone]   Fax:  [phone]  Name: DEEDE HESKETT MRN: [id number] Date of Birth: 1941/02/16

## 2020-07-21 ENCOUNTER — Ambulatory Visit (INDEPENDENT_AMBULATORY_CARE_PROVIDER_SITE_OTHER): Payer: Medicare Other | Admitting: Physical Therapy

## 2020-07-21 ENCOUNTER — Other Ambulatory Visit: Payer: Self-pay

## 2020-07-21 ENCOUNTER — Encounter: Payer: Self-pay | Admitting: Physical Therapy

## 2020-07-21 DIAGNOSIS — R600 Localized edema: Secondary | ICD-10-CM

## 2020-07-21 DIAGNOSIS — M25562 Pain in left knee: Secondary | ICD-10-CM

## 2020-07-21 DIAGNOSIS — R262 Difficulty in walking, not elsewhere classified: Secondary | ICD-10-CM | POA: Diagnosis not present

## 2020-07-21 DIAGNOSIS — M25662 Stiffness of left knee, not elsewhere classified: Secondary | ICD-10-CM

## 2020-07-21 NOTE — Therapy (Signed)
[place], [place], [address] Phone: [phone]   Fax:  [phone]  Physical Therapy Treatment  Patient Details  Name: Julie Graves MRN: [id number] Date of Birth: 1941/04/12 Referring Provider (PT): Rodell Perna, MD   Encounter Date: 07/21/2020   PT End of Session - 07/21/20 1555    Visit Number 6    Number of Visits 16    Date for PT Re-Evaluation 09/02/20    Progress Note Due on Visit 10    PT Start Time 1319    PT Stop Time 1407    PT Time Calculation (min) 48 min    Activity Tolerance Patient tolerated treatment well    Behavior During Therapy [facility] for tasks assessed/performed           Past Medical History:  Diagnosis Date  . Anginal pain ([place])    "small pain?nerves in chest"started 10/05/13   . Anginal pain ([place])    cleared by cardiology 3/15  . Anxiety    rx given recent not taken yet  . Aortic insufficiency    mild on 9/11 cath  . CAD (coronary artery disease)    nonobstructive let heart cath 3/11: 40% ostial D1, 30% mid CFX  . CHF (congestive heart failure) ([place])   . Colon polyps 02/01/2009    MULTIPLE FRAGMENTS OF TUBULAR ADENOMAS  . Diabetes mellitus   . Frozen shoulder   . GERD (gastroesophageal reflux disease)    occ  . History of blood transfusion    pregnancy  . HLD (hyperlipidemia)   . HTN (hypertension)    ACEI cough  . Hx of cardiovascular stress test    Lexiscan Myoview (10/2013):  Fixed ant defect most c/w breast attenuation, cannot exclude apical infarct; no ischemia, EF 46%; Low Risk  . Nonischemic cardiomyopathy ([place])    Ech 3/11 difficult study, moderate aortic insuficiency noted. Left evntriculogram 3/11  . Obese   . Osteoarthritis   . Palpitation    PACs noted on telemetry while in the hospital  . Pneumonia    hx  . Stress incontinence     Past Surgical History:  Procedure Laterality Date  . ABDOMINAL HYSTERECTOMY    . APPENDECTOMY    . BLADDER NECK SUSPENSION   85  . CARDIAC CATHETERIZATION    . EYE SURGERY    . HAND SURGERY     right "had knots cut out"  . KNEE ARTHROPLASTY Right 12/09/2013   Procedure: COMPUTER ASSISTED Right TOTAL KNEE ARTHROPLASTY;  Surgeon: Marybelle Killings, MD;  Location: [facility];  Service: Orthopedics;  Laterality: Right;  . LEFT HEART CATH AND CORONARY ANGIOGRAPHY N/A 08/15/2017   Procedure: LEFT HEART CATH AND CORONARY ANGIOGRAPHY;  Surgeon: Jettie Booze, MD;  Location: [place] [facility];  Service: Cardiovascular;  Laterality: N/A;  . TOTAL KNEE ARTHROPLASTY Left 05/13/2020   Procedure: LEFT TOTAL KNEE ARTHROPLASTY;  Surgeon: Marybelle Killings, MD;  Location: [facility];  Service: Orthopedics;  Laterality: Left;  . TUBAL LIGATION      There were no vitals filed for this visit.   Subjective Assessment - 07/21/20 1522    Subjective doing well, knee doesn't hurt today.    Pertinent History CAD, HTN, DM aortic insuffiency, hyperlipidemia OA, obesity, anxiety, h/o pneumonia, R TKA 2015, hysterectomy, heart cath angiograph 2019.    How long can you stand comfortably? 10 minutes    How long can you walk comfortably? 5 minutes    Diagnostic tests x-ray  Patient Stated Goals be able to go up and down steps in house    Currently in Pain? No/denies                             [facility] Adult PT Treatment/Exercise - 07/21/20 1523      Ambulation/Gait   Gait Comments amb with SPC mod I-safe to amb with SPC if she prefers      Knee/Hip Exercises: Stretches   Knee: Self-Stretch Limitations seated tailgate stretch for flexion with self OP 10 x 10 sec      Knee/Hip Exercises: Aerobic   Recumbent Bike seat 3 rocking back & forth with 5 sec hold for max flexion for 8 minutes      Knee/Hip Exercises: Seated   Long Arc Quad AROM;Left;10 reps;3 sets    Long Arc Quad Weight 2 lbs.      Knee/Hip Exercises: Supine   Quad Sets AROM;Left;2 sets;10 reps    Short Arc [employer] AROM;Strengthening;Left;2 sets;10 reps     Short Arc [employer] Limitations 3#    Heel Slides AAROM;Left;2 sets;10 reps    Heel Slides Limitations using strap    Straight Leg Raises Strengthening;Left;2 sets;10 reps    Straight Leg Raises Limitations 3# x 5 reps; no weight x 15 rep      Vasopneumatic   Number Minutes Vasopneumatic  10 minutes    Vasopnuematic Location  Knee    Vasopneumatic Pressure Medium    Vasopneumatic Temperature  34      Manual Therapy   Manual therapy comments Lt knee PROM flexion and extension 5 min to tolreance                    PT Short Term Goals - 07/21/20 1556      PT SHORT TERM GOAL #1   Title Pt will be independent in her initial HEP.    Time 3    Period Weeks    Status On-going    Target Date 07/29/20      PT SHORT TERM GOAL #2   Title Pt will be able to perform sit to stand with no UE support    Time 3    Period Weeks    Status Achieved    Target Date 07/29/20             PT Long Term Goals - 07/05/20 1531      PT LONG TERM GOAL #1   Title independent with HEP and progression    Time 8    Period Weeks    Status New    Target Date 09/02/20      PT LONG TERM GOAL #2   Title Pt will be albe to amb with no assistive device >/= 500 feet with pain </= 2/10.    Baseline amb with rolling walker short distances    Time 8    Period Weeks    Status New      PT LONG TERM GOAL #3   Title improve strength in RLE to at least 4/5 knee flex/ext for improved mobility.    Time 8    Period Weeks    Status New      PT LONG TERM GOAL #4   Title Pt will be albe to improve her L knee active  flexion to 100 degrees for improved mobility and gait.    Baseline AROM: 50 degrees    Time  8    Period Weeks    Status New      PT LONG TERM GOAL #5   Title Pt will improve her FOTO score to >/= 57% function.    Baseline 38% on 07/05/2020    Time 8    Period Weeks    Status New                 Plan - 07/21/20 1556    Clinical Impression Statement Pt has met STG #2 at  this time and is progressing well with PT.  She's safe to amb with SPC at this time if she prefers.  Will contiue to benefit from PT to maximize function.    Personal Factors and Comorbidities Comorbidity 3+    Comorbidities R TKA, CHF, anxiety, CAD, DM, HTN, OA, obesity, aortic insufficiency,    Examination-Activity Limitations Lift;Transfers;Stairs;Stand    Examination-Participation Restrictions Church;Other    Rehab Potential Good    PT Frequency 2x / week    PT Duration 8 weeks    PT Treatment/Interventions ADLs/Self Care Home Management;Cryotherapy;Electrical Stimulation;Ultrasound;Gait training;Stair training;Functional mobility training;Therapeutic activities;Therapeutic exercise;Balance training;Neuromuscular re-education;Patient/family education;Manual techniques;Scar mobilization;Passive range of motion    PT Next Visit Plan L knee ROM, strenghening, gait training, vaso    PT Home Exercise Plan Access Code: [email or web address]    Consulted and Agree with Plan of Care Patient           Patient will benefit from skilled therapeutic intervention in order to improve the following deficits and impairments:  Postural dysfunction,Decreased mobility,Decreased strength,Increased edema,Impaired flexibility,Decreased balance,Decreased activity tolerance,Pain,Obesity,Difficulty walking  Visit Diagnosis: Acute pain of left knee  Stiffness of left knee, not elsewhere classified  Difficulty in walking, not elsewhere classified  Localized edema     Problem List Patient Active Problem List   Diagnosis Date Noted  . Arthritis of left knee 05/13/2020  . S/P total knee arthroplasty, left 05/13/2020  . History of total knee arthroplasty, right 03/31/2020  . Precordial pain   . Angina pectoris ([place]) 08/13/2017  . Mixed dyslipidemia 08/13/2017  . CAD (coronary artery disease), native coronary artery 08/13/2017  . Mild episode of recurrent major depressive disorder ([place]) 11/03/2013  . Urge  incontinence 04/02/2013  . Spinal stenosis, lumbar region, without neurogenic claudication 10/27/2012  . Spinal stenosis of lumbar region 10/01/2012  . Allergic rhinitis 09/26/2011  . Dizziness 01/23/2011  . Dyspnea 01/23/2011  . Type II diabetes mellitus ([place]) 06/27/2010  . Morbid obesity ([place]) 06/27/2010  . Impaired glucose tolerance 06/27/2010  . Disorder of bone and cartilage 06/22/2010  . Primary cardiomyopathy ([place]) 03/23/2010  . HYPERLIPIDEMIA-MIXED 03/07/2010  . Essential hypertension 03/07/2010  . CARDIOMEGALY 11/30/2009  . OTHER DYSPNEA AND RESPIRATORY ABNORMALITIES 11/30/2009  . AORTIC REGURGITATION 09/28/2009  . Chronic diastolic heart failure ([place]) 09/28/2009  . Aortic regurgitation 09/28/2009  . 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 Osteoarthrosis involving multiple sites 10/26/2008      Laureen Abrahams, PT, DPT 07/21/20 3:58 PM       [facility] Physical Therapy [address] [place], [place], [address] Phone: [phone]   Fax:  [phone]  Name: Julie Graves MRN: [id number] Date of Birth: 06-Oct-1940

## 2020-07-26 ENCOUNTER — Other Ambulatory Visit: Payer: Self-pay

## 2020-07-26 ENCOUNTER — Ambulatory Visit (INDEPENDENT_AMBULATORY_CARE_PROVIDER_SITE_OTHER): Payer: Medicare Other | Admitting: Physical Therapy

## 2020-07-26 DIAGNOSIS — R262 Difficulty in walking, not elsewhere classified: Secondary | ICD-10-CM | POA: Diagnosis not present

## 2020-07-26 DIAGNOSIS — R600 Localized edema: Secondary | ICD-10-CM

## 2020-07-26 DIAGNOSIS — M25662 Stiffness of left knee, not elsewhere classified: Secondary | ICD-10-CM

## 2020-07-26 DIAGNOSIS — M25562 Pain in left knee: Secondary | ICD-10-CM | POA: Diagnosis not present

## 2020-07-26 NOTE — Therapy (Signed)
[place], [place], [address] Phone: [phone]   Fax:  [phone]  Physical Therapy Treatment  Patient Details  Name: Julie Graves MRN: [id number] Date of Birth: 11-09-1940 Referring Provider (PT): Rodell Perna, MD   Encounter Date: 07/26/2020   PT End of Session - 07/26/20 1554    Visit Number 7    Number of Visits 16    Date for PT Re-Evaluation 09/02/20    Progress Note Due on Visit 10    PT Start Time 5170    PT Stop Time 1603    PT Time Calculation (min) 48 min    Activity Tolerance Patient tolerated treatment well    Behavior During Therapy [facility] for tasks assessed/performed           Past Medical History:  Diagnosis Date  . Anginal pain ([place])    "small pain?nerves in chest"started 10/05/13   . Anginal pain ([place])    cleared by cardiology 3/15  . Anxiety    rx given recent not taken yet  . Aortic insufficiency    mild on 9/11 cath  . CAD (coronary artery disease)    nonobstructive let heart cath 3/11: 40% ostial D1, 30% mid CFX  . CHF (congestive heart failure) ([place])   . Colon polyps 02/01/2009    MULTIPLE FRAGMENTS OF TUBULAR ADENOMAS  . Diabetes mellitus   . Frozen shoulder   . GERD (gastroesophageal reflux disease)    occ  . History of blood transfusion    pregnancy  . HLD (hyperlipidemia)   . HTN (hypertension)    ACEI cough  . Hx of cardiovascular stress test    Lexiscan Myoview (10/2013):  Fixed ant defect most c/w breast attenuation, cannot exclude apical infarct; no ischemia, EF 46%; Low Risk  . Nonischemic cardiomyopathy ([place])    Ech 3/11 difficult study, moderate aortic insuficiency noted. Left evntriculogram 3/11  . Obese   . Osteoarthritis   . Palpitation    PACs noted on telemetry while in the hospital  . Pneumonia    hx  . Stress incontinence     Past Surgical History:  Procedure Laterality Date  . ABDOMINAL HYSTERECTOMY    . APPENDECTOMY    . BLADDER NECK SUSPENSION   85  . CARDIAC CATHETERIZATION    . EYE SURGERY    . HAND SURGERY     right "had knots cut out"  . KNEE ARTHROPLASTY Right 12/09/2013   Procedure: COMPUTER ASSISTED Right TOTAL KNEE ARTHROPLASTY;  Surgeon: Marybelle Killings, MD;  Location: [facility];  Service: Orthopedics;  Laterality: Right;  . LEFT HEART CATH AND CORONARY ANGIOGRAPHY N/A 08/15/2017   Procedure: LEFT HEART CATH AND CORONARY ANGIOGRAPHY;  Surgeon: Jettie Booze, MD;  Location: [place];  Service: Cardiovascular;  Laterality: N/A;  . TOTAL KNEE ARTHROPLASTY Left 05/13/2020   Procedure: LEFT TOTAL KNEE ARTHROPLASTY;  Surgeon: Marybelle Killings, MD;  Location: [facility];  Service: Orthopedics;  Laterality: Left;  . TUBAL LIGATION      There were no vitals filed for this visit.   Subjective Assessment - 07/26/20 1538    Subjective she denies knee pain upon arrival but does still have some anterior knee pain at times, she has now progressed ambulation to [facility]    Pertinent History CAD, HTN, DM aortic insuffiency, hyperlipidemia OA, obesity, anxiety, h/o pneumonia, R TKA 2015, hysterectomy, heart cath angiograph 2019.    How long can you stand comfortably? 10 minutes  How long can you walk comfortably? 5 minutes    Diagnostic tests x-ray    Patient Stated Goals be able to go up and down steps in house    Pain Onset 1 to 4 weeks ago              [facility] PT Assessment - 07/26/20 0001      Assessment   Medical Diagnosis L TKA [id number]    Referring Provider (PT) Rodell Perna, MD      PROM   Left Knee Flexion 103   in sitting     Strength   Left Knee Flexion 4+/5    Left Knee Extension 4+/5             [facility] Adult PT Treatment/Exercise - 07/26/20 0001      Knee/Hip Exercises: Stretches   Knee: Self-Stretch Limitations seated tailgate stretch for flexion with self OP 10 sec X 3 min      Knee/Hip Exercises: Aerobic   Nustep seat 5 BUEs & BLEs level 5 for 8 min      Knee/Hip Exercises: Standing   Knee Flexion  Left;2 sets;10 reps    Knee Flexion Limitations 3#    Hip Abduction Left;2 sets;10 reps    Abduction Limitations 3#      Knee/Hip Exercises: Seated   Long Arc Quad AROM;Left;10 reps;3 sets    Long Arc Quad Weight 3 lbs.    Sit to Sand 2 sets;5 reps;without UE support   slightly elevated table     Knee/Hip Exercises: Supine   Short Arc Quad Sets AROM;Strengthening;Left;2 sets;10 reps    Short Arc Quad Sets Limitations 3#    Straight Leg Raises Strengthening;2 sets;10 reps    Straight Leg Raises Limitations 2#      Vasopneumatic   Number Minutes Vasopneumatic  10 minutes    Vasopnuematic Location  Knee    Vasopneumatic Pressure Medium    Vasopneumatic Temperature  34      Manual Therapy   Manual therapy comments Lt knee PROM flexion and extension 8 min to tolreance                    PT Short Term Goals - 07/21/20 1556      PT SHORT TERM GOAL #1   Title Pt will be independent in her initial HEP.    Time 3    Period Weeks    Status On-going    Target Date 07/29/20      PT SHORT TERM GOAL #2   Title Pt will be able to perform sit to stand with no UE support    Time 3    Period Weeks    Status Achieved    Target Date 07/29/20             PT Long Term Goals - 07/05/20 1531      PT LONG TERM GOAL #1   Title independent with HEP and progression    Time 8    Period Weeks    Status New    Target Date 09/02/20      PT LONG TERM GOAL #2   Title Pt will be albe to amb with no assistive device >/= 500 feet with pain </= 2/10.    Baseline amb with rolling walker short distances    Time 8    Period Weeks    Status New      PT LONG TERM GOAL #3   Title improve strength  in RLE to at least 4/5 knee flex/ext for improved mobility.    Time 8    Period Weeks    Status New      PT LONG TERM GOAL #4   Title Pt will be albe to improve her L knee active  flexion to 100 degrees for improved mobility and gait.    Baseline AROM: 50 degrees    Time 8    Period  Weeks    Status New      PT LONG TERM GOAL #5   Title Pt will improve her FOTO score to >/= 57% function.    Baseline 38% on 07/05/2020    Time 8    Period Weeks    Status New                 Plan - 07/26/20 1555    Clinical Impression Statement Updated measurments show she is progressing well with Lt knee strength and ROM post op TKA. She does still have functional deficiits in this area and will continue to benefit from PT. She showed good tolerance to strength progression today.    Personal Factors and Comorbidities Comorbidity 3+    Comorbidities R TKA, CHF, anxiety, CAD, DM, HTN, OA, obesity, aortic insufficiency,    Examination-Activity Limitations Lift;Transfers;Stairs;Stand    Examination-Participation Restrictions Church;Other    Rehab Potential Good    PT Frequency 2x / week    PT Duration 8 weeks    PT Treatment/Interventions ADLs/Self Care Home Management;Cryotherapy;Electrical Stimulation;Ultrasound;Gait training;Stair training;Functional mobility training;Therapeutic activities;Therapeutic exercise;Balance training;Neuromuscular re-education;Patient/family education;Manual techniques;Scar mobilization;Passive range of motion    PT Next Visit Plan L knee ROM, strenghening, gait training, vaso    PT Home Exercise Plan Access Code: [email or web address]    Consulted and Agree with Plan of Care Patient           Patient will benefit from skilled therapeutic intervention in order to improve the following deficits and impairments:  Postural dysfunction,Decreased mobility,Decreased strength,Increased edema,Impaired flexibility,Decreased balance,Decreased activity tolerance,Pain,Obesity,Difficulty walking  Visit Diagnosis: Acute pain of left knee  Stiffness of left knee, not elsewhere classified  Difficulty in walking, not elsewhere classified  Localized edema     Problem List Patient Active Problem List   Diagnosis Date Noted  . Arthritis of left knee 05/13/2020  . S/P  total knee arthroplasty, left 05/13/2020  . History of total knee arthroplasty, right 03/31/2020  . Precordial pain   . Angina pectoris ([place]) 08/13/2017  . Mixed dyslipidemia 08/13/2017  . CAD (coronary artery disease), native coronary artery 08/13/2017  . Mild episode of recurrent major depressive disorder ([place]) 11/03/2013  . Urge incontinence 04/02/2013  . Spinal stenosis, lumbar region, without neurogenic claudication 10/27/2012  . Spinal stenosis of lumbar region 10/01/2012  . Allergic rhinitis 09/26/2011  . Dizziness 01/23/2011  . Dyspnea 01/23/2011  . Type II diabetes mellitus ([place]) 06/27/2010  . Morbid obesity ([place]) 06/27/2010  . Impaired glucose tolerance 06/27/2010  . Disorder of bone and cartilage 06/22/2010  . Primary cardiomyopathy ([place]) 03/23/2010  . HYPERLIPIDEMIA-MIXED 03/07/2010  . Essential hypertension 03/07/2010  . CARDIOMEGALY 11/30/2009  . OTHER DYSPNEA AND RESPIRATORY ABNORMALITIES 11/30/2009  . AORTIC REGURGITATION 09/28/2009  . Chronic diastolic heart failure ([place]) 09/28/2009  . Aortic regurgitation 09/28/2009  . 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 Osteoarthrosis involving multiple sites 10/26/2008    Debbe Odea, PT,DPT 07/26/2020, 3:58 PM  [facility] Physical Therapy [address] [place], [place], [address] Phone: [phone]   Fax:  [phone]  Name: Julie Graves MRN: [id number] Date of Birth: 1941/01/21

## 2020-07-28 ENCOUNTER — Ambulatory Visit (INDEPENDENT_AMBULATORY_CARE_PROVIDER_SITE_OTHER): Payer: Medicare Other | Admitting: Physical Therapy

## 2020-07-28 ENCOUNTER — Other Ambulatory Visit: Payer: Self-pay

## 2020-07-28 DIAGNOSIS — M25662 Stiffness of left knee, not elsewhere classified: Secondary | ICD-10-CM | POA: Diagnosis not present

## 2020-07-28 DIAGNOSIS — M25562 Pain in left knee: Secondary | ICD-10-CM | POA: Diagnosis not present

## 2020-07-28 DIAGNOSIS — R262 Difficulty in walking, not elsewhere classified: Secondary | ICD-10-CM

## 2020-07-28 DIAGNOSIS — R600 Localized edema: Secondary | ICD-10-CM | POA: Diagnosis not present

## 2020-07-28 NOTE — Therapy (Signed)
[place], [place], [address] Phone: [phone]   Fax:  [phone]  Physical Therapy Treatment  Patient Details  Name: Julie Graves MRN: [id number] Date of Birth: [birth year] Referring Provider (PT): Rodell Perna, MD   Encounter Date: 07/28/2020   PT End of Session - 07/28/20 1536    Visit Number 8    Number of Visits 16    Date for PT Re-Evaluation 09/02/20    Progress Note Due on Visit 10    PT Start Time 0160    PT Stop Time 1558    PT Time Calculation (min) 42 min    Activity Tolerance Patient tolerated treatment well    Behavior During Therapy [facility] for tasks assessed/performed           Past Medical History:  Diagnosis Date  . Anginal pain ([place])    "small pain?nerves in chest"started 10/05/13   . Anginal pain ([place])    cleared by cardiology 3/15  . Anxiety    rx given recent not taken yet  . Aortic insufficiency    mild on 9/11 cath  . CAD (coronary artery disease)    nonobstructive let heart cath 3/11: 40% ostial D1, 30% mid CFX  . CHF (congestive heart failure) ([place])   . Colon polyps 02/01/2009    MULTIPLE FRAGMENTS OF TUBULAR ADENOMAS  . Diabetes mellitus   . Frozen shoulder   . GERD (gastroesophageal reflux disease)    occ  . History of blood transfusion    pregnancy  . HLD (hyperlipidemia)   . HTN (hypertension)    ACEI cough  . Hx of cardiovascular stress test    Lexiscan Myoview (10/2013):  Fixed ant defect most c/w breast attenuation, cannot exclude apical infarct; no ischemia, EF 46%; Low Risk  . Nonischemic cardiomyopathy ([place])    Ech 3/11 difficult study, moderate aortic insuficiency noted. Left evntriculogram 3/11  . Obese   . Osteoarthritis   . Palpitation    PACs noted on telemetry while in the hospital  . Pneumonia    hx  . Stress incontinence     Past Surgical History:  Procedure Laterality Date  . ABDOMINAL HYSTERECTOMY    . APPENDECTOMY    . BLADDER NECK SUSPENSION   85  . CARDIAC CATHETERIZATION    . EYE SURGERY    . HAND SURGERY     right "had knots cut out"  . KNEE ARTHROPLASTY Right 12/09/2013   Procedure: COMPUTER ASSISTED Right TOTAL KNEE ARTHROPLASTY;  Surgeon: Marybelle Killings, MD;  Location: [place];  Service: Orthopedics;  Laterality: Right;  . LEFT HEART CATH AND CORONARY ANGIOGRAPHY N/A 08/15/2017   Procedure: LEFT HEART CATH AND CORONARY ANGIOGRAPHY;  Surgeon: Jettie Booze, MD;  Location: [place] [facility];  Service: Cardiovascular;  Laterality: N/A;  . TOTAL KNEE ARTHROPLASTY Left 05/13/2020   Procedure: LEFT TOTAL KNEE ARTHROPLASTY;  Surgeon: Marybelle Killings, MD;  Location: [place];  Service: Orthopedics;  Laterality: Left;  . TUBAL LIGATION      There were no vitals filed for this visit.   Subjective Assessment - 07/28/20 1535    Subjective not maving much pain in her left knee but still having stiffness    Pertinent History CAD, HTN, DM aortic insuffiency, hyperlipidemia OA, obesity, anxiety, h/o pneumonia, R TKA 2015, hysterectomy, heart cath angiograph 2019.    How long can you stand comfortably? 10 minutes    How long can you walk comfortably? 5 minutes  Diagnostic tests x-ray    Patient Stated Goals be able to go up and down steps in house    Pain Onset 1 to 4 weeks ago              [facility] Adult PT Treatment/Exercise - 07/28/20 0001      Knee/Hip Exercises: Stretches   Knee: Self-Stretch Limitations seated tailgate stretch for flexion with self OP 10 sec X 3 min      Knee/Hip Exercises: Standing   Knee Flexion Left;2 sets;10 reps    Hip Abduction Left;2 sets;10 reps    Abduction Limitations 3#    Forward Step Up Left;10 reps;Hand Hold: 1;Step Height: 4"      Knee/Hip Exercises: Seated   Long Arc Quad AROM;Left;10 reps;3 sets    Long Arc Quad Weight 3 lbs.    Sit to Sand 15 reps;without UE support   slightly raised mat surface     Knee/Hip Exercises: Supine   Hip Adduction Isometric Both;10 reps    Hip Adduction  Isometric Limitations 5 sec    Straight Leg Raises Strengthening;2 sets;10 reps      Vasopneumatic   Number Minutes Vasopneumatic  10 minutes    Vasopnuematic Location  Knee    Vasopneumatic Pressure Medium    Vasopneumatic Temperature  34      Manual Therapy   Manual therapy comments Lt knee PROM flexion and extension, manual H.S. stretching 30 sec  X 3                    PT Short Term Goals - 07/21/20 1556      PT SHORT TERM GOAL #1   Title Pt will be independent in her initial HEP.    Time 3    Period Weeks    Status On-going    Target Date 07/29/20      PT SHORT TERM GOAL #2   Title Pt will be able to perform sit to stand with no UE support    Time 3    Period Weeks    Status Achieved    Target Date 07/29/20             PT Long Term Goals - 07/05/20 1531      PT LONG TERM GOAL #1   Title independent with HEP and progression    Time 8    Period Weeks    Status New    Target Date 09/02/20      PT LONG TERM GOAL #2   Title Pt will be albe to amb with no assistive device >/= 500 feet with pain </= 2/10.    Baseline amb with rolling walker short distances    Time 8    Period Weeks    Status New      PT LONG TERM GOAL #3   Title improve strength in RLE to at least 4/5 knee flex/ext for improved mobility.    Time 8    Period Weeks    Status New      PT LONG TERM GOAL #4   Title Pt will be albe to improve her L knee active  flexion to 100 degrees for improved mobility and gait.    Baseline AROM: 50 degrees    Time 8    Period Weeks    Status New      PT LONG TERM GOAL #5   Title Pt will improve her FOTO score to >/= 57% function.  Baseline 38% on 07/05/2020    Time 8    Period Weeks    Status New                 Plan - 07/28/20 1552    Clinical Impression Statement She had fair to good overall tolerance to strength progression and was able to add in 4 inch step ups today. Continued wth vaso at her request post tx to reduce  overall sorness and edema. PT will continue to progress as able.    Personal Factors and Comorbidities Comorbidity 3+    Comorbidities R TKA, CHF, anxiety, CAD, DM, HTN, OA, obesity, aortic insufficiency,    Examination-Activity Limitations Lift;Transfers;Stairs;Stand    Examination-Participation Restrictions Church;Other    Rehab Potential Good    PT Frequency 2x / week    PT Duration 8 weeks    PT Treatment/Interventions ADLs/Self Care Home Management;Cryotherapy;Electrical Stimulation;Ultrasound;Gait training;Stair training;Functional mobility training;Therapeutic activities;Therapeutic exercise;Balance training;Neuromuscular re-education;Patient/family education;Manual techniques;Scar mobilization;Passive range of motion    PT Next Visit Plan L knee ROM, strenghening, gait training, vaso    PT Home Exercise Plan Access Code: [email or web address]    Consulted and Agree with Plan of Care Patient           Patient will benefit from skilled therapeutic intervention in order to improve the following deficits and impairments:  Postural dysfunction,Decreased mobility,Decreased strength,Increased edema,Impaired flexibility,Decreased balance,Decreased activity tolerance,Pain,Obesity,Difficulty walking  Visit Diagnosis: Acute pain of left knee  Stiffness of left knee, not elsewhere classified  Difficulty in walking, not elsewhere classified  Localized edema     Problem List Patient Active Problem List   Diagnosis Date Noted  . Arthritis of left knee 05/13/2020  . S/P total knee arthroplasty, left 05/13/2020  . History of total knee arthroplasty, right 03/31/2020  . Precordial pain   . Angina pectoris ([place]) 08/13/2017  . Mixed dyslipidemia 08/13/2017  . CAD (coronary artery disease), native coronary artery 08/13/2017  . Mild episode of recurrent major depressive disorder ([place]) 11/03/2013  . Urge incontinence 04/02/2013  . Spinal stenosis, lumbar region, without neurogenic claudication  10/27/2012  . Spinal stenosis of lumbar region 10/01/2012  . Allergic rhinitis 09/26/2011  . Dizziness 01/23/2011  . Dyspnea 01/23/2011  . Type II diabetes mellitus ([place]) 06/27/2010  . Morbid obesity ([place]) 06/27/2010  . Impaired glucose tolerance 06/27/2010  . Disorder of bone and cartilage 06/22/2010  . Primary cardiomyopathy ([place]) 03/23/2010  . HYPERLIPIDEMIA-MIXED 03/07/2010  . Essential hypertension 03/07/2010  . CARDIOMEGALY 11/30/2009  . OTHER DYSPNEA AND RESPIRATORY ABNORMALITIES 11/30/2009  . AORTIC REGURGITATION 09/28/2009  . Chronic diastolic heart failure ([place]) 09/28/2009  . Aortic regurgitation 09/28/2009  . 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 Osteoarthrosis involving multiple sites 10/26/2008    Silvestre Mesi 07/28/2020, 3:53 PM  [facility] Physical Therapy [address] [place], [place], [address] Phone: [phone]   Fax:  [phone]  Name: SARITA HAKANSON MRN: [id number] Date of Birth: 1941/05/08

## 2020-07-29 ENCOUNTER — Ambulatory Visit (INDEPENDENT_AMBULATORY_CARE_PROVIDER_SITE_OTHER): Payer: Medicare Other | Admitting: Orthopaedic Surgery

## 2020-07-29 ENCOUNTER — Encounter: Payer: Self-pay | Admitting: Orthopaedic Surgery

## 2020-07-29 VITALS — Ht 60.0 in | Wt 200.0 lb

## 2020-07-29 DIAGNOSIS — Z96652 Presence of left artificial knee joint: Secondary | ICD-10-CM

## 2020-07-29 MED ORDER — HYDROCODONE-ACETAMINOPHEN 5-325 MG PO TABS
1.0000 | ORAL_TABLET | Freq: Four times a day (QID) | ORAL | 0 refills | Status: DC | PRN
Start: 2020-07-29 — End: 2020-08-26

## 2020-07-29 NOTE — Progress Notes (Signed)
Office Visit Note   Patient: Julie Graves           Date of Birth: 18-Apr-1941           MRN: [id number] Visit Date: 07/29/2020              Requested by: Harrison Mons, [place],  [place] [address] PCP: Harrison Mons, PA   Assessment & Plan: Visit Diagnoses:  1. S/P total knee arthroplasty, left     Plan: Postop total knee she still has pitting edema more than left than right she takes Lasix as heart failure has type 2 diabetes.  She is having trouble he wants her daughter's help getting the teds on.  We discussed using plastic bag reviewed some YouTube videos and she'll try it first thing in the morning when she had her leg elevated at night.  She needs to work on quad strengthening still has short stride scissor gait with a cane.  Recheck 2 months continue quad strengthening.  Follow-Up Instructions: Return in about 2 months (around 09/26/2020).   Orders:  No orders of the defined types were placed in this encounter.  Meds ordered this encounter  Medications  . HYDROcodone-acetaminophen (NORCO/VICODIN) 5-325 MG tablet    Sig: Take 1 tablet by mouth every 6 (six) hours as needed for moderate pain.    Dispense:  30 tablet    Refill:  0      Procedures: No procedures performed   Clinical Data: No additional findings.   Subjective: Chief Complaint  Patient presents with  . Left Knee - Follow-up    05/13/2020 left TKA    HPI follow-up left on the arthroplasty.  Amatory with a cane.  Review of Systems updated unchanged   Objective: Vital Signs: Ht 5' (1.524 m)   Wt 200 lb (90.7 kg)   BMI 39.06 kg/m   Physical Exam no change in general exam.  Ortho Exam well-healed midline incision left knee.  Pitting edema bilaterally worse on the left than right.  Specialty Comments:  No specialty comments available.  Imaging: No results found.   PMFS History: Patient Active Problem List   Diagnosis Date Noted  . Arthritis  of left knee 05/13/2020  . S/P total knee arthroplasty, left 05/13/2020  . History of total knee arthroplasty, right 03/31/2020  . Precordial pain   . Angina pectoris ([place]) 08/13/2017  . Mixed dyslipidemia 08/13/2017  . CAD (coronary artery disease), native coronary artery 08/13/2017  . Mild episode of recurrent major depressive disorder ([place]) 11/03/2013  . Urge incontinence 04/02/2013  . Spinal stenosis, lumbar region, without neurogenic claudication 10/27/2012  . Spinal stenosis of lumbar region 10/01/2012  . Allergic rhinitis 09/26/2011  . Dizziness 01/23/2011  . Dyspnea 01/23/2011  . Type II diabetes mellitus ([place]) 06/27/2010  . Morbid obesity ([place]) 06/27/2010  . Impaired glucose tolerance 06/27/2010  . Disorder of bone and cartilage 06/22/2010  . Primary cardiomyopathy ([place]) 03/23/2010  . HYPERLIPIDEMIA-MIXED 03/07/2010  . Essential hypertension 03/07/2010  . CARDIOMEGALY 11/30/2009  . OTHER DYSPNEA AND RESPIRATORY ABNORMALITIES 11/30/2009  . AORTIC REGURGITATION 09/28/2009  . Chronic diastolic heart failure ([place]) 09/28/2009  . Aortic regurgitation 09/28/2009  . 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 Osteoarthrosis involving multiple sites 10/26/2008   Past Medical History:  Diagnosis Date  . Anginal pain ([place])    "small pain?nerves in chest"started 10/05/13   . Anginal pain ([place])    cleared by cardiology 3/15  . Anxiety  rx given recent not taken yet  . Aortic insufficiency    mild on 9/11 cath  . CAD (coronary artery disease)    nonobstructive let heart cath 3/11: 40% ostial D1, 30% mid CFX  . CHF (congestive heart failure) ([place])   . Colon polyps 02/01/2009    MULTIPLE FRAGMENTS OF TUBULAR ADENOMAS  . Diabetes mellitus   . Frozen shoulder   . GERD (gastroesophageal reflux disease)    occ  . History of blood transfusion    pregnancy  . HLD (hyperlipidemia)   . HTN (hypertension)    ACEI cough  . Hx of cardiovascular stress test    Lexiscan Myoview  (10/2013):  Fixed ant defect most c/w breast attenuation, cannot exclude apical infarct; no ischemia, EF 46%; Low Risk  . Nonischemic cardiomyopathy ([place])    Ech 3/11 difficult study, moderate aortic insuficiency noted. Left evntriculogram 3/11  . Obese   . Osteoarthritis   . Palpitation    PACs noted on telemetry while in the hospital  . Pneumonia    hx  . Stress incontinence     Family History  Problem Relation Age of Onset  . Heart attack Father   . Heart attack Sister   . Stroke Mother 7  . Diabetes Sister   . Heart disease Sister   . Diabetes Brother   . Colon cancer Brother   . Breast cancer Neg Hx     Past Surgical History:  Procedure Laterality Date  . ABDOMINAL HYSTERECTOMY    . APPENDECTOMY    . BLADDER NECK SUSPENSION  85  . CARDIAC CATHETERIZATION    . EYE SURGERY    . HAND SURGERY     right "had knots cut out"  . KNEE ARTHROPLASTY Right 12/09/2013   Procedure: COMPUTER ASSISTED Right TOTAL KNEE ARTHROPLASTY;  Surgeon: Marybelle Killings, MD;  Location: [place];  Service: Orthopedics;  Laterality: Right;  . LEFT HEART CATH AND CORONARY ANGIOGRAPHY N/A 08/15/2017   Procedure: LEFT HEART CATH AND CORONARY ANGIOGRAPHY;  Surgeon: Jettie Booze, MD;  Location: [place] [facility];  Service: Cardiovascular;  Laterality: N/A;  . TOTAL KNEE ARTHROPLASTY Left 05/13/2020   Procedure: LEFT TOTAL KNEE ARTHROPLASTY;  Surgeon: Marybelle Killings, MD;  Location: [place];  Service: Orthopedics;  Laterality: Left;  . TUBAL LIGATION     Social History   Occupational History  . Not on file  Tobacco Use  . Smoking status: Former Smoker    Packs/day: 1.00    Years: 15.00    Pack years: 15.00    Types: Cigarettes    Quit date: 10/10/1983    Years since quitting: 36.8  . Smokeless tobacco: Never Used  . Tobacco comment: Quit 1970s  Vaping Use  . Vaping Use: Never used  Substance and Sexual Activity  . Alcohol use: No    Alcohol/week: 0.0 standard drinks  . Drug use: No  . Sexual  activity: Not on file

## 2020-08-02 ENCOUNTER — Other Ambulatory Visit: Payer: Self-pay

## 2020-08-02 ENCOUNTER — Ambulatory Visit (INDEPENDENT_AMBULATORY_CARE_PROVIDER_SITE_OTHER): Payer: Medicare Other | Admitting: Physical Therapy

## 2020-08-02 ENCOUNTER — Encounter: Payer: Self-pay | Admitting: Physical Therapy

## 2020-08-02 DIAGNOSIS — M25562 Pain in left knee: Secondary | ICD-10-CM

## 2020-08-02 DIAGNOSIS — M25662 Stiffness of left knee, not elsewhere classified: Secondary | ICD-10-CM

## 2020-08-02 DIAGNOSIS — R262 Difficulty in walking, not elsewhere classified: Secondary | ICD-10-CM | POA: Diagnosis not present

## 2020-08-02 DIAGNOSIS — R600 Localized edema: Secondary | ICD-10-CM

## 2020-08-02 NOTE — Therapy (Signed)
[place], [place], [address] Phone: [phone]   Fax:  [phone]  Physical Therapy Treatment  Patient Details  Name: Julie Graves MRN: [id number] Date of Birth: [birth year] Referring Provider (PT): Rodell Perna, MD   Encounter Date: 08/02/2020   PT End of Session - 08/02/20 1553    Visit Number 9    Number of Visits 16    Date for PT Re-Evaluation 09/02/20    Progress Note Due on Visit 10    PT Start Time 1512    PT Stop Time 1600    PT Time Calculation (min) 48 min    Activity Tolerance Patient tolerated treatment well    Behavior During Therapy [facility] for tasks assessed/performed           Past Medical History:  Diagnosis Date  . Anginal pain ([place])    "small pain?nerves in chest"started 10/05/13   . Anginal pain ([place])    cleared by cardiology 3/15  . Anxiety    rx given recent not taken yet  . Aortic insufficiency    mild on 9/11 cath  . CAD (coronary artery disease)    nonobstructive let heart cath 3/11: 40% ostial D1, 30% mid CFX  . CHF (congestive heart failure) ([place])   . Colon polyps 02/01/2009    MULTIPLE FRAGMENTS OF TUBULAR ADENOMAS  . Diabetes mellitus   . Frozen shoulder   . GERD (gastroesophageal reflux disease)    occ  . History of blood transfusion    pregnancy  . HLD (hyperlipidemia)   . HTN (hypertension)    ACEI cough  . Hx of cardiovascular stress test    Lexiscan Myoview (10/2013):  Fixed ant defect most c/w breast attenuation, cannot exclude apical infarct; no ischemia, EF 46%; Low Risk  . Nonischemic cardiomyopathy ([place])    Ech 3/11 difficult study, moderate aortic insuficiency noted. Left evntriculogram 3/11  . Obese   . Osteoarthritis   . Palpitation    PACs noted on telemetry while in the hospital  . Pneumonia    hx  . Stress incontinence     Past Surgical History:  Procedure Laterality Date  . ABDOMINAL HYSTERECTOMY    . APPENDECTOMY    . BLADDER NECK SUSPENSION   85  . CARDIAC CATHETERIZATION    . EYE SURGERY    . HAND SURGERY     right "had knots cut out"  . KNEE ARTHROPLASTY Right 12/09/2013   Procedure: COMPUTER ASSISTED Right TOTAL KNEE ARTHROPLASTY;  Surgeon: Marybelle Killings, MD;  Location: [place];  Service: Orthopedics;  Laterality: Right;  . LEFT HEART CATH AND CORONARY ANGIOGRAPHY N/A 08/15/2017   Procedure: LEFT HEART CATH AND CORONARY ANGIOGRAPHY;  Surgeon: Jettie Booze, MD;  Location: [facility] [place] [facility];  Service: Cardiovascular;  Laterality: N/A;  . TOTAL KNEE ARTHROPLASTY Left 05/13/2020   Procedure: LEFT TOTAL KNEE ARTHROPLASTY;  Surgeon: Marybelle Killings, MD;  Location: [place];  Service: Orthopedics;  Laterality: Left;  . TUBAL LIGATION      There were no vitals filed for this visit.   Subjective Assessment - 08/02/20 1516    Subjective she says her left knee is doing so so, more swelling overall today. she did follow up with MD and says he though she was doing okay overall,                             [facility] Adult  PT Treatment/Exercise - 08/02/20 0001      Knee/Hip Exercises: Stretches   Knee: Self-Stretch Limitations seated tailgate stretch for flexion with self OP 10 sec X 3 min    Gastroc Stretch 3 reps;30 seconds;Both    Gastroc Stretch Limitations slantboard      Knee/Hip Exercises: Aerobic   Nustep seat 5 BUEs & BLEs level 4 for 10 min      Knee/Hip Exercises: Machines for Strengthening   Total Gym Leg Press DL 50 lbs 2X10   needs assistance to get into machine and set up     Knee/Hip Exercises: Standing   Knee Flexion Left;2 sets;10 reps    Knee Flexion Limitations 3#    Hip Abduction Left;2 sets;10 reps    Abduction Limitations 3#      Knee/Hip Exercises: Seated   Long Arc Quad AROM;Left;10 reps;3 sets    Long Arc Quad Weight 3 lbs.    Sit to Sand 15 reps;without UE support   chair with airex pad     Vasopneumatic   Number Minutes Vasopneumatic  10 minutes    Vasopnuematic Location  Knee     Vasopneumatic Pressure Medium    Vasopneumatic Temperature  34      Manual Therapy   Manual therapy comments Lt knee PROM flexion and extension, manual H.S. stretching 30 sec  X 3                    PT Short Term Goals - 07/21/20 1556      PT SHORT TERM GOAL #1   Title Pt will be independent in her initial HEP.    Time 3    Period Weeks    Status On-going    Target Date 07/29/20      PT SHORT TERM GOAL #2   Title Pt will be able to perform sit to stand with no UE support    Time 3    Period Weeks    Status Achieved    Target Date 07/29/20             PT Long Term Goals - 07/05/20 1531      PT LONG TERM GOAL #1   Title independent with HEP and progression    Time 8    Period Weeks    Status New    Target Date 09/02/20      PT LONG TERM GOAL #2   Title Pt will be albe to amb with no assistive device >/= 500 feet with pain </= 2/10.    Baseline amb with rolling walker short distances    Time 8    Period Weeks    Status New      PT LONG TERM GOAL #3   Title improve strength in RLE to at least 4/5 knee flex/ext for improved mobility.    Time 8    Period Weeks    Status New      PT LONG TERM GOAL #4   Title Pt will be albe to improve her L knee active  flexion to 100 degrees for improved mobility and gait.    Baseline AROM: 50 degrees    Time 8    Period Weeks    Status New      PT LONG TERM GOAL #5   Title Pt will improve her FOTO score to >/= 57% function.    Baseline 38% on 07/05/2020    Time 8    Period Weeks  Status New                 Plan - 08/02/20 1556    Clinical Impression Statement Added leg press today with good tolerance and she overall likes this machine but does have difficulty getting in it. She does still relay she has not tried to go up/down the stairs yet so we will work on this next session. She has been doing 4 inch step ups in clinic but we will try to progress her to 6 inch.    Personal Factors and  Comorbidities Comorbidity 3+    Comorbidities R TKA, CHF, anxiety, CAD, DM, HTN, OA, obesity, aortic insufficiency,    Examination-Activity Limitations Lift;Transfers;Stairs;Stand    Examination-Participation Restrictions Church;Other    Rehab Potential Good    PT Frequency 2x / week    PT Duration 8 weeks    PT Treatment/Interventions ADLs/Self Care Home Management;Cryotherapy;Electrical Stimulation;Ultrasound;Gait training;Stair training;Functional mobility training;Therapeutic activities;Therapeutic exercise;Balance training;Neuromuscular re-education;Patient/family education;Manual techniques;Scar mobilization;Passive range of motion    PT Next Visit Plan work on stairs, L knee ROM, strenghening, gait training, vaso    PT Home Exercise Plan Access Code: [email or web address]    Consulted and Agree with Plan of Care Patient           Patient will benefit from skilled therapeutic intervention in order to improve the following deficits and impairments:  Postural dysfunction,Decreased mobility,Decreased strength,Increased edema,Impaired flexibility,Decreased balance,Decreased activity tolerance,Pain,Obesity,Difficulty walking  Visit Diagnosis: Acute pain of left knee  Stiffness of left knee, not elsewhere classified  Difficulty in walking, not elsewhere classified  Localized edema     Problem List Patient Active Problem List   Diagnosis Date Noted  . Arthritis of left knee 05/13/2020  . S/P total knee arthroplasty, left 05/13/2020  . History of total knee arthroplasty, right 03/31/2020  . Precordial pain   . Angina pectoris ([place]) 08/13/2017  . Mixed dyslipidemia 08/13/2017  . CAD (coronary artery disease), native coronary artery 08/13/2017  . Mild episode of recurrent major depressive disorder ([place]) 11/03/2013  . Urge incontinence 04/02/2013  . Spinal stenosis, lumbar region, without neurogenic claudication 10/27/2012  . Spinal stenosis of lumbar region 10/01/2012  . Allergic rhinitis  09/26/2011  . Dizziness 01/23/2011  . Dyspnea 01/23/2011  . Type II diabetes mellitus ([place]) 06/27/2010  . Morbid obesity ([place]) 06/27/2010  . Impaired glucose tolerance 06/27/2010  . Disorder of bone and cartilage 06/22/2010  . Primary cardiomyopathy ([place]) 03/23/2010  . HYPERLIPIDEMIA-MIXED 03/07/2010  . Essential hypertension 03/07/2010  . CARDIOMEGALY 11/30/2009  . OTHER DYSPNEA AND RESPIRATORY ABNORMALITIES 11/30/2009  . AORTIC REGURGITATION 09/28/2009  . Chronic diastolic heart failure ([place]) 09/28/2009  . Aortic regurgitation 09/28/2009  . 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 Osteoarthrosis involving multiple sites 10/26/2008    Debbe Odea , PT,DPT 08/02/2020, 4:00 PM  [place], [place], [address] Phone: [phone]   Fax:  [phone]  Name: Julie Graves MRN: [id number] Date of Birth: [birth year]

## 2020-08-04 ENCOUNTER — Other Ambulatory Visit: Payer: Self-pay

## 2020-08-04 ENCOUNTER — Encounter: Payer: Self-pay | Admitting: Rehabilitative and Restorative Service Providers"

## 2020-08-04 ENCOUNTER — Ambulatory Visit (INDEPENDENT_AMBULATORY_CARE_PROVIDER_SITE_OTHER): Payer: Medicare Other | Admitting: Rehabilitative and Restorative Service Providers"

## 2020-08-04 DIAGNOSIS — R262 Difficulty in walking, not elsewhere classified: Secondary | ICD-10-CM | POA: Diagnosis not present

## 2020-08-04 DIAGNOSIS — M25662 Stiffness of left knee, not elsewhere classified: Secondary | ICD-10-CM

## 2020-08-04 DIAGNOSIS — R600 Localized edema: Secondary | ICD-10-CM

## 2020-08-04 DIAGNOSIS — M6281 Muscle weakness (generalized): Secondary | ICD-10-CM

## 2020-08-04 DIAGNOSIS — G8929 Other chronic pain: Secondary | ICD-10-CM

## 2020-08-04 DIAGNOSIS — M25562 Pain in left knee: Secondary | ICD-10-CM

## 2020-08-04 NOTE — Therapy (Signed)
[place], [place], [address] Phone: [phone]   Fax:  [phone]  Physical Therapy Treatment  Patient Details  Name: Julie Graves MRN: [id number] Date of Birth: 11-23-1940 Referring Provider (PT): Rodell Perna, MD   Encounter Date: 08/04/2020   PT End of Session - 08/04/20 1616    Visit Number 10    Number of Visits 16    Date for PT Re-Evaluation 09/02/20    Progress Note Due on Visit 11    PT Start Time 3220    PT Stop Time 1608    PT Time Calculation (min) 53 min    Activity Tolerance Patient tolerated treatment well;No increased pain;Patient limited by fatigue    Behavior During Therapy [facility] for tasks assessed/performed           Past Medical History:  Diagnosis Date  . Anginal pain ([place])    "small pain?nerves in chest"started 10/05/13   . Anginal pain ([place])    cleared by cardiology 3/15  . Anxiety    rx given recent not taken yet  . Aortic insufficiency    mild on 9/11 cath  . CAD (coronary artery disease)    nonobstructive let heart cath 3/11: 40% ostial D1, 30% mid CFX  . CHF (congestive heart failure) ([place])   . Colon polyps 02/01/2009    MULTIPLE FRAGMENTS OF TUBULAR ADENOMAS  . Diabetes mellitus   . Frozen shoulder   . GERD (gastroesophageal reflux disease)    occ  . History of blood transfusion    pregnancy  . HLD (hyperlipidemia)   . HTN (hypertension)    ACEI cough  . Hx of cardiovascular stress test    Lexiscan Myoview (10/2013):  Fixed ant defect most c/w breast attenuation, cannot exclude apical infarct; no ischemia, EF 46%; Low Risk  . Nonischemic cardiomyopathy ([place])    Ech 3/11 difficult study, moderate aortic insuficiency noted. Left evntriculogram 3/11  . Obese   . Osteoarthritis   . Palpitation    PACs noted on telemetry while in the hospital  . Pneumonia    hx  . Stress incontinence     Past Surgical History:  Procedure Laterality Date  . ABDOMINAL HYSTERECTOMY     . APPENDECTOMY    . BLADDER NECK SUSPENSION  85  . CARDIAC CATHETERIZATION    . EYE SURGERY    . HAND SURGERY     right "had knots cut out"  . KNEE ARTHROPLASTY Right 12/09/2013   Procedure: COMPUTER ASSISTED Right TOTAL KNEE ARTHROPLASTY;  Surgeon: Marybelle Killings, MD;  Location: [place];  Service: Orthopedics;  Laterality: Right;  . LEFT HEART CATH AND CORONARY ANGIOGRAPHY N/A 08/15/2017   Procedure: LEFT HEART CATH AND CORONARY ANGIOGRAPHY;  Surgeon: Jettie Booze, MD;  Location: [place] [facility];  Service: Cardiovascular;  Laterality: N/A;  . TOTAL KNEE ARTHROPLASTY Left 05/13/2020   Procedure: LEFT TOTAL KNEE ARTHROPLASTY;  Surgeon: Marybelle Killings, MD;  Location: [place];  Service: Orthopedics;  Laterality: Left;  . TUBAL LIGATION      There were no vitals filed for this visit.   Subjective Assessment - 08/04/20 1612    Subjective Julie Graves reports trying to use her cane less around the house.  She is still battling edema with too much WB.    Currently in Pain? Yes    Pain Score 5     Pain Location Knee    Pain Orientation Left    Pain Descriptors /  Indicators Sore;Tightness    Pain Type Surgical pain;Chronic pain    Pain Onset More than a month ago    Pain Frequency Intermittent    Aggravating Factors  End range flexion and too much WB    Pain Relieving Factors Ice and elevation    Effect of Pain on Daily Activities Uses a cane even in the house.  Endurance with standing and walking is limited.    Multiple Pain Sites No                             [facility] Adult PT Treatment/Exercise - 08/04/20 0001      Therapeutic Activites    Therapeutic Activities ADL's    ADL's Step-up and over 6 inch step with handrail and slow eccentrics (avoid circumduction) and step-down off 2 inch step      Neuro Re-ed    Neuro Re-ed Details  Wide heel to toe balance 6X 20 seconds B      Exercises   Exercises Knee/Hip      Knee/Hip Exercises: Aerobic   Recumbent Bike Seat 3  for AAROM 8 minutes      Knee/Hip Exercises: Machines for Strengthening   Total Gym Leg Press Double leg 50# and 75# 10X each and L leg only 25# 10X slow eccentrics      Knee/Hip Exercises: Seated   Other Seated Knee/Hip Exercises Seated knee flexion with self-overpressure 10X 10 seconds      Vasopneumatic   Number Minutes Vasopneumatic  10 minutes    Vasopnuematic Location  Knee    Vasopneumatic Pressure Medium    Vasopneumatic Temperature  34                  PT Education - 08/04/20 1614    Education Details Progress clinic program adding steps and proprioceptive activities to complement flexion AROM and quadriceps strengthening.    Person(s) Educated Patient    Methods Explanation;Demonstration;Verbal cues    Comprehension Verbalized understanding;Returned demonstration;Need further instruction;Verbal cues required            PT Short Term Goals - 08/04/20 1615      PT SHORT TERM GOAL #1   Title Pt will be independent in her initial HEP.    Time 3    Period Weeks    Status Achieved    Target Date 07/29/20      PT SHORT TERM GOAL #2   Title Pt will be able to perform sit to stand with no UE support    Time 3    Period Weeks    Status Achieved    Target Date 07/29/20             PT Long Term Goals - 08/04/20 1615      PT LONG TERM GOAL #1   Title independent with HEP and progression    Time 8    Period Weeks    Status On-going      PT LONG TERM GOAL #2   Title Pt will be albe to amb with no assistive device >/= 500 feet with pain </= 2/10.    Baseline amb with rolling walker short distances    Time 8    Period Weeks    Status On-going      PT LONG TERM GOAL #3   Title improve strength in RLE to at least 4/5 knee flex/ext for improved mobility.    Time 8  Period Weeks    Status On-going      PT LONG TERM GOAL #4   Title Pt will be albe to improve her L knee active  flexion to 100 degrees for improved mobility and gait.    Baseline AROM:  50 degrees    Time 8    Period Weeks    Status On-going      PT LONG TERM GOAL #5   Title Pt will improve her FOTO score to >/= 57% function.    Baseline 38% on 07/05/2020    Time 8    Period Weeks    Status On-going                 Plan - 08/04/20 1616    Clinical Impression Statement Flexion AROM, quadriceps strength and balance were the focus of today's physical therapy.  We worked on eccentric quadriceps control and knee flexion with steps and added balance activities to address proprioception.  Julie Graves still has deficits in flexion AROM, quadriceps strength and gait quality/endurance (uses a cane).  Continue supervised physical therapy to address all areas noted at evaluation to meet LTGs.    Personal Factors and Comorbidities Comorbidity 3+    Comorbidities R TKA, CHF, anxiety, CAD, DM, HTN, OA, obesity, aortic insufficiency,    Examination-Activity Limitations Lift;Transfers;Stairs;Stand    Examination-Participation Restrictions Church;Other    Rehab Potential Good    PT Frequency 2x / week    PT Duration 8 weeks    PT Treatment/Interventions ADLs/Self Care Home Management;Cryotherapy;Electrical Stimulation;Ultrasound;Gait training;Stair training;Functional mobility training;Therapeutic activities;Therapeutic exercise;Balance training;Neuromuscular re-education;Patient/family education;Manual techniques;Scar mobilization;Passive range of motion    PT Next Visit Plan work on stairs, L knee ROM, strenghening, gait training, vaso    PT Home Exercise Plan Access Code: [email or web address]    Consulted and Agree with Plan of Care Patient           Patient will benefit from skilled therapeutic intervention in order to improve the following deficits and impairments:  Postural dysfunction,Decreased mobility,Decreased strength,Increased edema,Impaired flexibility,Decreased balance,Decreased activity tolerance,Pain,Obesity,Difficulty walking  Visit Diagnosis: Difficulty in walking, not  elsewhere classified  Muscle weakness (generalized)  Localized edema  Stiffness of left knee, not elsewhere classified  Chronic pain of left knee     Problem List Patient Active Problem List   Diagnosis Date Noted  . Arthritis of left knee 05/13/2020  . S/P total knee arthroplasty, left 05/13/2020  . History of total knee arthroplasty, right 03/31/2020  . Precordial pain   . Angina pectoris ([place]) 08/13/2017  . Mixed dyslipidemia 08/13/2017  . CAD (coronary artery disease), native coronary artery 08/13/2017  . Mild episode of recurrent major depressive disorder ([place]) 11/03/2013  . Urge incontinence 04/02/2013  . Spinal stenosis, lumbar region, without neurogenic claudication 10/27/2012  . Spinal stenosis of lumbar region 10/01/2012  . Allergic rhinitis 09/26/2011  . Dizziness 01/23/2011  . Dyspnea 01/23/2011  . Type II diabetes mellitus ([place]) 06/27/2010  . Morbid obesity ([place]) 06/27/2010  . Impaired glucose tolerance 06/27/2010  . Disorder of bone and cartilage 06/22/2010  . Primary cardiomyopathy ([place]) 03/23/2010  . HYPERLIPIDEMIA-MIXED 03/07/2010  . Essential hypertension 03/07/2010  . CARDIOMEGALY 11/30/2009  . OTHER DYSPNEA AND RESPIRATORY ABNORMALITIES 11/30/2009  . AORTIC REGURGITATION 09/28/2009  . Chronic diastolic heart failure ([place]) 09/28/2009  . Aortic regurgitation 09/28/2009  . 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 Osteoarthrosis involving multiple sites 10/26/2008    Farley Ly PT, MPT 08/04/2020, 4:20 PM  [facility] Physical Therapy [phone]  [place], [place], [address] Phone: [phone]   Fax:  [phone]  Name: Julie Graves MRN: [id number] Date of Birth: 03-Mar-1941

## 2020-08-09 ENCOUNTER — Encounter: Payer: Self-pay | Admitting: Rehabilitative and Restorative Service Providers"

## 2020-08-09 ENCOUNTER — Ambulatory Visit (INDEPENDENT_AMBULATORY_CARE_PROVIDER_SITE_OTHER): Payer: Medicare Other | Admitting: Rehabilitative and Restorative Service Providers"

## 2020-08-09 ENCOUNTER — Other Ambulatory Visit: Payer: Self-pay

## 2020-08-09 DIAGNOSIS — M6281 Muscle weakness (generalized): Secondary | ICD-10-CM

## 2020-08-09 DIAGNOSIS — M25562 Pain in left knee: Secondary | ICD-10-CM | POA: Diagnosis not present

## 2020-08-09 DIAGNOSIS — M25662 Stiffness of left knee, not elsewhere classified: Secondary | ICD-10-CM

## 2020-08-09 DIAGNOSIS — R600 Localized edema: Secondary | ICD-10-CM

## 2020-08-09 DIAGNOSIS — G8929 Other chronic pain: Secondary | ICD-10-CM

## 2020-08-09 DIAGNOSIS — R262 Difficulty in walking, not elsewhere classified: Secondary | ICD-10-CM | POA: Diagnosis not present

## 2020-08-09 NOTE — Therapy (Signed)
[place], [place], [address] Phone: [phone]   Fax:  [phone]  Physical Therapy Treatment  Patient Details  Name: Julie Graves MRN: [id number] Date of Birth: 02/07/1941 Referring Provider (PT): Rodell Perna, MD   Encounter Date: 08/09/2020   PT End of Session - 08/09/20 1605    Visit Number 11    Number of Visits 16    Date for PT Re-Evaluation 09/02/20    Progress Note Due on Visit 11    PT Start Time 2202    PT Stop Time 1609    PT Time Calculation (min) 58 min    Activity Tolerance Patient tolerated treatment well;No increased pain;Patient limited by fatigue    Behavior During Therapy [facility] for tasks assessed/performed           Past Medical History:  Diagnosis Date  . Anginal pain ([place])    "small pain?nerves in chest"started 10/05/13   . Anginal pain ([place])    cleared by cardiology 3/15  . Anxiety    rx given recent not taken yet  . Aortic insufficiency    mild on 9/11 cath  . CAD (coronary artery disease)    nonobstructive let heart cath 3/11: 40% ostial D1, 30% mid CFX  . CHF (congestive heart failure) ([place])   . Colon polyps 02/01/2009    MULTIPLE FRAGMENTS OF TUBULAR ADENOMAS  . Diabetes mellitus   . Frozen shoulder   . GERD (gastroesophageal reflux disease)    occ  . History of blood transfusion    pregnancy  . HLD (hyperlipidemia)   . HTN (hypertension)    ACEI cough  . Hx of cardiovascular stress test    Lexiscan Myoview (10/2013):  Fixed ant defect most c/w breast attenuation, cannot exclude apical infarct; no ischemia, EF 46%; Low Risk  . Nonischemic cardiomyopathy ([place])    Ech 3/11 difficult study, moderate aortic insuficiency noted. Left evntriculogram 3/11  . Obese   . Osteoarthritis   . Palpitation    PACs noted on telemetry while in the hospital  . Pneumonia    hx  . Stress incontinence     Past Surgical History:  Procedure Laterality Date  . ABDOMINAL HYSTERECTOMY     . APPENDECTOMY    . BLADDER NECK SUSPENSION  85  . CARDIAC CATHETERIZATION    . EYE SURGERY    . HAND SURGERY     right "had knots cut out"  . KNEE ARTHROPLASTY Right 12/09/2013   Procedure: COMPUTER ASSISTED Right TOTAL KNEE ARTHROPLASTY;  Surgeon: Marybelle Killings, MD;  Location: [place];  Service: Orthopedics;  Laterality: Right;  . LEFT HEART CATH AND CORONARY ANGIOGRAPHY N/A 08/15/2017   Procedure: LEFT HEART CATH AND CORONARY ANGIOGRAPHY;  Surgeon: Jettie Booze, MD;  Location: [place] [facility];  Service: Cardiovascular;  Laterality: N/A;  . TOTAL KNEE ARTHROPLASTY Left 05/13/2020   Procedure: LEFT TOTAL KNEE ARTHROPLASTY;  Surgeon: Marybelle Killings, MD;  Location: [place];  Service: Orthopedics;  Laterality: Left;  . TUBAL LIGATION      There were no vitals filed for this visit.   Subjective Assessment - 08/09/20 1522    Subjective Julie Graves states her L knee is not affecting her sleep.  She has ordered some thigh high compressive stockings.    Limitations Standing;Walking    How long can you stand comfortably? < 10 minutes    How long can you walk comfortably? 5 minutes    Patient Stated Goals  To be able to go up and down a flight of stairs    Currently in Pain? No/denies    Pain Descriptors / Indicators Tightness    Pain Onset More than a month ago    Pain Frequency Intermittent    Aggravating Factors  End range flexion and too much WB    Pain Relieving Factors Ice and elevation    Effect of Pain on Daily Activities Uses a cane even in the house.  Endurance with standing and walking is limited.    Multiple Pain Sites No                             [facility] Adult PT Treatment/Exercise - 08/09/20 0001      Therapeutic Activites    Therapeutic Activities ADL's    ADL's Step-up and over 6 inch step with handrail and slow eccentrics (avoid circumduction) and step-down off 2 inch step      Neuro Re-ed    Neuro Re-ed Details  Wide heel to toe balance 7X 20 seconds B       Exercises   Exercises Knee/Hip      Knee/Hip Exercises: Aerobic   Recumbent Bike Seat 3 for AAROM 8 minutes      Knee/Hip Exercises: Machines for Strengthening   Total Gym Leg Press Double leg 75# and L only 25# slow eccentrics 10X each      Knee/Hip Exercises: Seated   Other Seated Knee/Hip Exercises --      Vasopneumatic   Number Minutes Vasopneumatic  10 minutes    Vasopnuematic Location  Knee    Vasopneumatic Pressure Medium    Vasopneumatic Temperature  34                  PT Education - 08/09/20 1603    Education Details Talked about practicing driving in an empty parking lot to practice getting from gas to brake.  Recommended she have someone in the car with her the first time she practices.    Person(s) Educated Patient    Methods Explanation;Verbal cues    Comprehension Verbalized understanding;Verbal cues required;Need further instruction            PT Short Term Goals - 08/09/20 1604      PT SHORT TERM GOAL #1   Title Pt will be independent in her initial HEP.    Time 3    Period Weeks    Status Achieved    Target Date 07/29/20      PT SHORT TERM GOAL #2   Title Pt will be able to perform sit to stand with no UE support    Time 3    Period Weeks    Status Achieved    Target Date 07/29/20             PT Long Term Goals - 08/09/20 1604      PT LONG TERM GOAL #1   Title independent with HEP and progression    Time 8    Period Weeks    Status On-going      PT LONG TERM GOAL #2   Title Pt will be albe to amb with no assistive device >/= 500 feet with pain </= 2/10.    Baseline amb with rolling walker short distances    Time 8    Period Weeks    Status On-going      PT LONG TERM GOAL #3  Title improve strength in RLE to at least 4/5 knee flex/ext for improved mobility.    Time 8    Period Weeks    Status On-going      PT LONG TERM GOAL #4   Title Pt will be albe to improve her L knee active  flexion to 100 degrees for  improved mobility and gait.    Baseline AROM: 50 degrees    Time 8    Period Weeks    Status On-going      PT LONG TERM GOAL #5   Title Pt will improve her FOTO score to >/= 57% function.    Baseline 38% on 07/05/2020    Time 8    Period Weeks    Status On-going                 Plan - 08/09/20 1605    Clinical Impression Statement Julie Graves reports good HEP compliance.  We spent alot of time working on stairs and balance today (particularly stairs).  With continued work, flexion AROM, quadriceps strength, gait quality, balance and stairs will improve.  Continue current POC.    Personal Factors and Comorbidities Comorbidity 3+    Comorbidities R TKA, CHF, anxiety, CAD, DM, HTN, OA, obesity, aortic insufficiency,    Examination-Activity Limitations Lift;Transfers;Stairs;Stand    Examination-Participation Restrictions Church;Other    Rehab Potential Good    PT Frequency 2x / week    PT Duration 8 weeks    PT Treatment/Interventions ADLs/Self Care Home Management;Cryotherapy;Electrical Stimulation;Ultrasound;Gait training;Stair training;Functional mobility training;Therapeutic activities;Therapeutic exercise;Balance training;Neuromuscular re-education;Patient/family education;Manual techniques;Scar mobilization;Passive range of motion    PT Next Visit Plan work on stairs, L knee ROM, strenghening, gait training, vaso    PT Home Exercise Plan Access Code: [email or web address]    Consulted and Agree with Plan of Care Patient           Patient will benefit from skilled therapeutic intervention in order to improve the following deficits and impairments:  Postural dysfunction,Decreased mobility,Decreased strength,Increased edema,Impaired flexibility,Decreased balance,Decreased activity tolerance,Pain,Obesity,Difficulty walking  Visit Diagnosis: Difficulty in walking, not elsewhere classified  Muscle weakness (generalized)  Stiffness of left knee, not elsewhere classified  Chronic pain of left  knee  Localized edema     Problem List Patient Active Problem List   Diagnosis Date Noted  . Arthritis of left knee 05/13/2020  . S/P total knee arthroplasty, left 05/13/2020  . History of total knee arthroplasty, right 03/31/2020  . Precordial pain   . Angina pectoris ([place]) 08/13/2017  . Mixed dyslipidemia 08/13/2017  . CAD (coronary artery disease), native coronary artery 08/13/2017  . Mild episode of recurrent major depressive disorder ([place]) 11/03/2013  . Urge incontinence 04/02/2013  . Spinal stenosis, lumbar region, without neurogenic claudication 10/27/2012  . Spinal stenosis of lumbar region 10/01/2012  . Allergic rhinitis 09/26/2011  . Dizziness 01/23/2011  . Dyspnea 01/23/2011  . Type II diabetes mellitus ([place]) 06/27/2010  . Morbid obesity ([place]) 06/27/2010  . Impaired glucose tolerance 06/27/2010  . Disorder of bone and cartilage 06/22/2010  . Primary cardiomyopathy ([place]) 03/23/2010  . HYPERLIPIDEMIA-MIXED 03/07/2010  . Essential hypertension 03/07/2010  . CARDIOMEGALY 11/30/2009  . OTHER DYSPNEA AND RESPIRATORY ABNORMALITIES 11/30/2009  . AORTIC REGURGITATION 09/28/2009  . Chronic diastolic heart failure ([place]) 09/28/2009  . Aortic regurgitation 09/28/2009  . 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 Osteoarthrosis involving multiple sites 10/26/2008    Farley Ly PT, MPT 08/09/2020, 4:08 PM  [facility] Physical Therapy [place],  [place], [address] Phone: [phone]   Fax:  [phone]  Name: Julie Graves MRN: [id number] Date of Birth: Jan 14, 1941

## 2020-08-11 ENCOUNTER — Encounter: Payer: Medicare Other | Admitting: Physical Therapy

## 2020-08-18 ENCOUNTER — Ambulatory Visit (INDEPENDENT_AMBULATORY_CARE_PROVIDER_SITE_OTHER): Payer: Medicare Other | Admitting: Rehabilitative and Restorative Service Providers"

## 2020-08-18 ENCOUNTER — Other Ambulatory Visit: Payer: Self-pay

## 2020-08-18 ENCOUNTER — Encounter: Payer: Self-pay | Admitting: Rehabilitative and Restorative Service Providers"

## 2020-08-18 DIAGNOSIS — R600 Localized edema: Secondary | ICD-10-CM

## 2020-08-18 DIAGNOSIS — M6281 Muscle weakness (generalized): Secondary | ICD-10-CM

## 2020-08-18 DIAGNOSIS — M25662 Stiffness of left knee, not elsewhere classified: Secondary | ICD-10-CM | POA: Diagnosis not present

## 2020-08-18 DIAGNOSIS — M25562 Pain in left knee: Secondary | ICD-10-CM | POA: Diagnosis not present

## 2020-08-18 DIAGNOSIS — R262 Difficulty in walking, not elsewhere classified: Secondary | ICD-10-CM

## 2020-08-18 DIAGNOSIS — G8929 Other chronic pain: Secondary | ICD-10-CM

## 2020-08-18 NOTE — Therapy (Signed)
[place], [place], [address] Phone: [phone]   Fax:  [phone]  Physical Therapy Treatment/Recertification  Patient Details  Name: Julie Graves MRN: [id number] Date of Birth: February 17, 1941 Referring Provider (PT): Rodell Perna, MD   Encounter Date: 08/18/2020   PT End of Session - 08/18/20 1609    Visit Number 12    Number of Visits 16    Date for PT Re-Evaluation 09/02/20    Progress Note Due on Visit 16    PT Start Time 2951    PT Stop Time 8841    PT Time Calculation (min) 50 min    Activity Tolerance Patient tolerated treatment well;No increased pain;Patient limited by fatigue    Behavior During Therapy [facility] for tasks assessed/performed           Past Medical History:  Diagnosis Date  . Anginal pain ([place])    "small pain?nerves in chest"started 10/05/13   . Anginal pain ([place])    cleared by cardiology 3/15  . Anxiety    rx given recent not taken yet  . Aortic insufficiency    mild on 9/11 cath  . CAD (coronary artery disease)    nonobstructive let heart cath 3/11: 40% ostial D1, 30% mid CFX  . CHF (congestive heart failure) ([place])   . Colon polyps 02/01/2009    MULTIPLE FRAGMENTS OF TUBULAR ADENOMAS  . Diabetes mellitus   . Frozen shoulder   . GERD (gastroesophageal reflux disease)    occ  . History of blood transfusion    pregnancy  . HLD (hyperlipidemia)   . HTN (hypertension)    ACEI cough  . Hx of cardiovascular stress test    Lexiscan Myoview (10/2013):  Fixed ant defect most c/w breast attenuation, cannot exclude apical infarct; no ischemia, EF 46%; Low Risk  . Nonischemic cardiomyopathy ([place])    Ech 3/11 difficult study, moderate aortic insuficiency noted. Left evntriculogram 3/11  . Obese   . Osteoarthritis   . Palpitation    PACs noted on telemetry while in the hospital  . Pneumonia    hx  . Stress incontinence     Past Surgical History:  Procedure Laterality Date  . ABDOMINAL  HYSTERECTOMY    . APPENDECTOMY    . BLADDER NECK SUSPENSION  85  . CARDIAC CATHETERIZATION    . EYE SURGERY    . HAND SURGERY     right "had knots cut out"  . KNEE ARTHROPLASTY Right 12/09/2013   Procedure: COMPUTER ASSISTED Right TOTAL KNEE ARTHROPLASTY;  Surgeon: Marybelle Killings, MD;  Location: [facility];  Service: Orthopedics;  Laterality: Right;  . LEFT HEART CATH AND CORONARY ANGIOGRAPHY N/A 08/15/2017   Procedure: LEFT HEART CATH AND CORONARY ANGIOGRAPHY;  Surgeon: Jettie Booze, MD;  Location: [facility] CV [facility];  Service: Cardiovascular;  Laterality: N/A;  . TOTAL KNEE ARTHROPLASTY Left 05/13/2020   Procedure: LEFT TOTAL KNEE ARTHROPLASTY;  Surgeon: Marybelle Killings, MD;  Location: [facility];  Service: Orthopedics;  Laterality: Left;  . TUBAL LIGATION      There were no vitals filed for this visit.   Subjective Assessment - 08/18/20 1526    Subjective [name] notes she was able to go up and down stairs with a handrail and her cane!    Limitations Standing;Walking    How long can you stand comfortably? < 10 minutes    How long can you walk comfortably? 5 minutes    Patient Stated Goals To be  able to go up and down a flight of stairs.  Walk without "relying" on the cane.    Currently in Pain? Yes    Pain Score 3     Pain Location Knee    Pain Orientation Left    Pain Descriptors / Indicators Tightness;Aching    Pain Type Chronic pain;Surgical pain    Pain Onset More than a month ago    Pain Frequency Intermittent    Aggravating Factors  Too much WB and stiffness with prolonged postures    Pain Relieving Factors Ice and elevation    Effect of Pain on Daily Activities Needs a cane full time.  Poor endurance with WB function.  Although improving, apprehensive with stairs.    Multiple Pain Sites No              [facility] PT Assessment - 08/18/20 0001      AROM   Overall AROM  Deficits    AROM Assessment Site Knee    Right/Left Knee Left;Right    Right Knee Extension 0    Right Knee  Flexion 102    Left Knee Extension -1    Left Knee Flexion 95                         [facility] Adult PT Treatment/Exercise - 08/18/20 0001      Therapeutic Activites    Therapeutic Activities ADL's    ADL's Step-up and over 6 and 8 inch step with handrail and slow eccentrics (avoid circumduction)      Neuro Re-ed    Neuro Re-ed Details  Wide heel to toe balance 4X 20 seconds B and dynamic HT laps 4X      Exercises   Exercises Knee/Hip      Knee/Hip Exercises: Aerobic   Recumbent Bike Seat 3 full revolutions      Knee/Hip Exercises: Machines for Strengthening   Total Gym Leg Press Double leg 75# and L only 25# slow eccentrics 10X each      Knee/Hip Exercises: Seated   Other Seated Knee/Hip Exercises --      Vasopneumatic   Number Minutes Vasopneumatic  --    Vasopnuematic Location  --    Vasopneumatic Pressure --    Vasopneumatic Temperature  --                  PT Education - 08/18/20 1607    Education Details Reviewed exam findings.  Recommend continued strength work to improve stair quality and decrease reliance on the cane.    Person(s) Educated Patient    Methods Explanation;Demonstration;Tactile cues;Verbal cues    Comprehension Verbal cues required;Need further instruction;Returned demonstration;Verbalized understanding;Tactile cues required            PT Short Term Goals - 08/18/20 1608      PT SHORT TERM GOAL #1   Title Pt will be independent in her initial HEP.    Time 3    Period Weeks    Status Achieved    Target Date 07/29/20      PT SHORT TERM GOAL #2   Title Pt will be able to perform sit to stand with no UE support    Time 3    Period Weeks    Status Achieved    Target Date 07/29/20             PT Long Term Goals - 08/18/20 1608      PT LONG  TERM GOAL #1   Title independent with HEP and progression    Time 8    Period Weeks    Status On-going      PT LONG TERM GOAL #2   Title Pt will be albe to amb with no  assistive device >/= 500 feet with pain </= 2/10.    Baseline amb with rolling walker short distances    Time 8    Period Weeks    Status On-going      PT LONG TERM GOAL #3   Title improve strength in RLE to at least 4/5 knee flex/ext for improved mobility.    Time 8    Period Weeks    Status On-going      PT LONG TERM GOAL #4   Title Pt will be albe to improve her L knee active  flexion to 100 degrees for improved mobility and gait.    Baseline AROM: 50 degrees, 95 on 08/18/2020    Time 8    Period Weeks    Status On-going      PT LONG TERM GOAL #5   Title Pt will improve her FOTO score to >/= 57% function.    Baseline 38% on 07/05/2020, 59 on 08/18/2020    Time 8    Period Weeks    Status Achieved                 Plan - 08/18/20 1610    Clinical Impression Statement [name] was able to complete a full revolution on the bike for the first time today.  She also reports she was able to ascend and descend stairs with a cane and handrail for the first time since surgery.  She still has apprehension with stairs and would like to not have to "rely" on her cane which she has used for 6 months.  AROM and self-reported function have made excellent progress (See objective).  She will benefit from another 2-4 weeks of strength work to meet her goal of walking without "relying" on the cane.    Personal Factors and Comorbidities Comorbidity 3+    Comorbidities R TKA, CHF, anxiety, CAD, DM, HTN, OA, obesity, aortic insufficiency,    Examination-Activity Limitations Lift;Transfers;Stairs;Stand    Examination-Participation Restrictions Church;Other    Stability/Clinical Decision Making Stable/Uncomplicated    Clinical Decision Making Low    Rehab Potential Good    PT Frequency 2x / week    PT Duration 4 weeks    PT Treatment/Interventions ADLs/Self Care Home Management;Cryotherapy;Electrical Stimulation;Ultrasound;Gait training;Stair training;Functional mobility training;Therapeutic  activities;Therapeutic exercise;Balance training;Neuromuscular re-education;Patient/family education;Manual techniques;Scar mobilization;Passive range of motion    PT Next Visit Plan Gait without cane, quadriceps strength, stairs    PT Home Exercise Plan Access Code: [email or web address]    Consulted and Agree with Plan of Care Patient           Patient will benefit from skilled therapeutic intervention in order to improve the following deficits and impairments:  Postural dysfunction,Decreased mobility,Decreased strength,Increased edema,Impaired flexibility,Decreased balance,Decreased activity tolerance,Pain,Obesity,Difficulty walking  Visit Diagnosis: Difficulty in walking, not elsewhere classified  Muscle weakness (generalized)  Stiffness of left knee, not elsewhere classified  Chronic pain of left knee  Localized edema     Problem List Patient Active Problem List   Diagnosis Date Noted  . Arthritis of left knee 05/13/2020  . S/P total knee arthroplasty, left 05/13/2020  . History of total knee arthroplasty, right 03/31/2020  . Precordial pain   . Angina pectoris ([place]) 08/13/2017  .  Mixed dyslipidemia 08/13/2017  . CAD (coronary artery disease), native coronary artery 08/13/2017  . Mild episode of recurrent major depressive disorder ([place]) 11/03/2013  . Urge incontinence 04/02/2013  . Spinal stenosis, lumbar region, without neurogenic claudication 10/27/2012  . Spinal stenosis of lumbar region 10/01/2012  . Allergic rhinitis 09/26/2011  . Dizziness 01/23/2011  . Dyspnea 01/23/2011  . Type II diabetes mellitus ([place]) 06/27/2010  . Morbid obesity ([place]) 06/27/2010  . Impaired glucose tolerance 06/27/2010  . Disorder of bone and cartilage 06/22/2010  . Primary cardiomyopathy ([place]) 03/23/2010  . HYPERLIPIDEMIA-MIXED 03/07/2010  . Essential hypertension 03/07/2010  . CARDIOMEGALY 11/30/2009  . OTHER DYSPNEA AND RESPIRATORY ABNORMALITIES 11/30/2009  . AORTIC REGURGITATION 09/28/2009   . Chronic diastolic heart failure ([place]) 09/28/2009  . Aortic regurgitation 09/28/2009  . 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 Osteoarthrosis involving multiple sites 10/26/2008    Farley Ly PT, MPT 08/18/2020, 4:13 PM  [facility] Physical Therapy [address] [place], [place], [address] Phone: [phone]   Fax:  [phone]  Name: SHIRLYN SAVIN MRN: [id number] Date of Birth: 01-Jan-1941

## 2020-08-22 ENCOUNTER — Emergency Department (HOSPITAL_COMMUNITY): Payer: Medicare Other

## 2020-08-22 ENCOUNTER — Other Ambulatory Visit: Payer: Self-pay

## 2020-08-22 ENCOUNTER — Encounter (HOSPITAL_COMMUNITY): Payer: Self-pay

## 2020-08-22 ENCOUNTER — Inpatient Hospital Stay (HOSPITAL_COMMUNITY)
Admission: EM | Admit: 2020-08-22 | Discharge: 2020-08-26 | DRG: 853 | Disposition: A | Discharge status: Home / Self Care | Payer: Medicare Other | Attending: Family Medicine | Admitting: Family Medicine

## 2020-08-22 DIAGNOSIS — A419 Sepsis, unspecified organism: Principal | ICD-10-CM | POA: Diagnosis present

## 2020-08-22 DIAGNOSIS — I110 Hypertensive heart disease with heart failure: Secondary | ICD-10-CM | POA: Diagnosis present

## 2020-08-22 DIAGNOSIS — Z91013 Allergy to seafood: Secondary | ICD-10-CM

## 2020-08-22 DIAGNOSIS — Z885 Allergy status to narcotic agent status: Secondary | ICD-10-CM

## 2020-08-22 DIAGNOSIS — I351 Nonrheumatic aortic (valve) insufficiency: Secondary | ICD-10-CM | POA: Diagnosis present

## 2020-08-22 DIAGNOSIS — R079 Chest pain, unspecified: Secondary | ICD-10-CM

## 2020-08-22 DIAGNOSIS — Z79899 Other long term (current) drug therapy: Secondary | ICD-10-CM

## 2020-08-22 DIAGNOSIS — M159 Polyosteoarthritis, unspecified: Secondary | ICD-10-CM | POA: Diagnosis present

## 2020-08-22 DIAGNOSIS — I248 Other forms of acute ischemic heart disease: Secondary | ICD-10-CM | POA: Diagnosis present

## 2020-08-22 DIAGNOSIS — R778 Other specified abnormalities of plasma proteins: Secondary | ICD-10-CM

## 2020-08-22 DIAGNOSIS — Z87891 Personal history of nicotine dependence: Secondary | ICD-10-CM

## 2020-08-22 DIAGNOSIS — K810 Acute cholecystitis: Secondary | ICD-10-CM | POA: Diagnosis not present

## 2020-08-22 DIAGNOSIS — Z888 Allergy status to other drugs, medicaments and biological substances status: Secondary | ICD-10-CM

## 2020-08-22 DIAGNOSIS — I25118 Atherosclerotic heart disease of native coronary artery with other forms of angina pectoris: Secondary | ICD-10-CM | POA: Diagnosis present

## 2020-08-22 DIAGNOSIS — F419 Anxiety disorder, unspecified: Secondary | ICD-10-CM | POA: Diagnosis present

## 2020-08-22 DIAGNOSIS — K8000 Calculus of gallbladder with acute cholecystitis without obstruction: Secondary | ICD-10-CM | POA: Diagnosis present

## 2020-08-22 DIAGNOSIS — E041 Nontoxic single thyroid nodule: Secondary | ICD-10-CM

## 2020-08-22 DIAGNOSIS — E119 Type 2 diabetes mellitus without complications: Secondary | ICD-10-CM

## 2020-08-22 DIAGNOSIS — K5730 Diverticulosis of large intestine without perforation or abscess without bleeding: Secondary | ICD-10-CM | POA: Diagnosis present

## 2020-08-22 DIAGNOSIS — Z6839 Body mass index (BMI) 39.0-39.9, adult: Secondary | ICD-10-CM

## 2020-08-22 DIAGNOSIS — Z880 Allergy status to penicillin: Secondary | ICD-10-CM

## 2020-08-22 DIAGNOSIS — I700 Atherosclerosis of aorta: Secondary | ICD-10-CM | POA: Diagnosis present

## 2020-08-22 DIAGNOSIS — E782 Mixed hyperlipidemia: Secondary | ICD-10-CM | POA: Diagnosis present

## 2020-08-22 DIAGNOSIS — R7989 Other specified abnormal findings of blood chemistry: Secondary | ICD-10-CM

## 2020-08-22 DIAGNOSIS — Z7982 Long term (current) use of aspirin: Secondary | ICD-10-CM

## 2020-08-22 DIAGNOSIS — I5032 Chronic diastolic (congestive) heart failure: Secondary | ICD-10-CM | POA: Diagnosis present

## 2020-08-22 DIAGNOSIS — Z20822 Contact with and (suspected) exposure to covid-19: Secondary | ICD-10-CM | POA: Diagnosis present

## 2020-08-22 DIAGNOSIS — K82A1 Gangrene of gallbladder in cholecystitis: Secondary | ICD-10-CM | POA: Diagnosis present

## 2020-08-22 DIAGNOSIS — K219 Gastro-esophageal reflux disease without esophagitis: Secondary | ICD-10-CM | POA: Diagnosis present

## 2020-08-22 DIAGNOSIS — Z8249 Family history of ischemic heart disease and other diseases of the circulatory system: Secondary | ICD-10-CM

## 2020-08-22 DIAGNOSIS — I5033 Acute on chronic diastolic (congestive) heart failure: Secondary | ICD-10-CM

## 2020-08-22 DIAGNOSIS — I10 Essential (primary) hypertension: Secondary | ICD-10-CM | POA: Diagnosis present

## 2020-08-22 DIAGNOSIS — Z7984 Long term (current) use of oral hypoglycemic drugs: Secondary | ICD-10-CM

## 2020-08-22 DIAGNOSIS — Z833 Family history of diabetes mellitus: Secondary | ICD-10-CM

## 2020-08-22 DIAGNOSIS — Z8701 Personal history of pneumonia (recurrent): Secondary | ICD-10-CM

## 2020-08-22 DIAGNOSIS — K819 Cholecystitis, unspecified: Secondary | ICD-10-CM

## 2020-08-22 DIAGNOSIS — I428 Other cardiomyopathies: Secondary | ICD-10-CM | POA: Diagnosis present

## 2020-08-22 DIAGNOSIS — Z419 Encounter for procedure for purposes other than remedying health state, unspecified: Secondary | ICD-10-CM

## 2020-08-22 LAB — HEPATIC FUNCTION PANEL
ALT: 16 U/L (ref 0–44)
AST: 25 U/L (ref 15–41)
Albumin: 4.4 g/dL (ref 3.5–5.0)
Alkaline Phosphatase: 38 U/L (ref 38–126)
Bilirubin, Direct: 0.4 mg/dL — ABNORMAL HIGH (ref 0.0–0.2)
Indirect Bilirubin: 1.1 mg/dL — ABNORMAL HIGH (ref 0.3–0.9)
Total Bilirubin: 1.5 mg/dL — ABNORMAL HIGH (ref 0.3–1.2)
Total Protein: 7.9 g/dL (ref 6.5–8.1)

## 2020-08-22 LAB — BASIC METABOLIC PANEL
Anion gap: 9 (ref 5–15)
BUN: 12 mg/dL (ref 8–23)
CO2: 25 mmol/L (ref 22–32)
Calcium: 9.6 mg/dL (ref 8.9–10.3)
Chloride: 102 mmol/L (ref 98–111)
Creatinine, Ser: 0.59 mg/dL (ref 0.44–1.00)
GFR, Estimated: >60 mL/min (ref >60)
Glucose, Bld: 122 mg/dL — ABNORMAL HIGH (ref 70–99)
Potassium: 4.1 mmol/L (ref 3.5–5.1)
Sodium: 136 mmol/L (ref 135–145)

## 2020-08-22 LAB — CBC
HCT: 40.1 % (ref 36.0–46.0)
Hemoglobin: 12.9 g/dL (ref 12.0–15.0)
MCH: 30.6 pg (ref 26.0–34.0)
MCHC: 32.2 g/dL (ref 30.0–36.0)
MCV: 95.2 fL (ref 80.0–100.0)
Platelets: 260 10*3/uL (ref 150–400)
RBC: 4.21 MIL/uL (ref 3.87–5.11)
RDW: 13.1 % (ref 11.5–15.5)
WBC: 10.9 10*3/uL — ABNORMAL HIGH (ref 4.0–10.5)
nRBC: 0 % (ref 0.0–0.2)

## 2020-08-22 LAB — LIPASE, BLOOD: Lipase: 24 U/L (ref 11–51)

## 2020-08-22 LAB — TROPONIN I (HIGH SENSITIVITY)
Troponin I (High Sensitivity): 12 ng/L (ref <18)
Troponin I (High Sensitivity): 79 ng/L — ABNORMAL HIGH (ref <18)

## 2020-08-22 MED ORDER — SODIUM CHLORIDE 0.9 % IV BOLUS (SEPSIS)
1000.0000 mL | Freq: Once | INTRAVENOUS | Status: AC
  Administered 2020-08-22: 1000 mL via INTRAVENOUS

## 2020-08-22 MED ORDER — SODIUM CHLORIDE 0.9 % IV SOLN
1000.0000 mL | INTRAVENOUS | Status: DC
  Administered 2020-08-22 – 2020-08-23 (×2): 1000 mL via INTRAVENOUS

## 2020-08-22 MED ORDER — ASPIRIN 81 MG PO CHEW
324.0000 mg | CHEWABLE_TABLET | Freq: Once | ORAL | Status: AC
  Administered 2020-08-22: 324 mg via ORAL
  Filled 2020-08-22: qty 4

## 2020-08-22 MED ORDER — ONDANSETRON HCL 4 MG/2ML IJ SOLN
4.0000 mg | Freq: Once | INTRAMUSCULAR | Status: AC
  Administered 2020-08-22: 4 mg via INTRAVENOUS
  Filled 2020-08-22: qty 2

## 2020-08-22 MED ORDER — HYDROMORPHONE HCL 1 MG/ML IJ SOLN
0.5000 mg | INTRAMUSCULAR | Status: AC | PRN
  Administered 2020-08-22 (×2): 0.5 mg via INTRAVENOUS
  Filled 2020-08-22 (×2): qty 1

## 2020-08-22 NOTE — ED Triage Notes (Signed)
Pt reports upper abdominal pain and left sided chest pain increasing throughout today.

## 2020-08-22 NOTE — ED Provider Notes (Signed)
[facility] DEPT Provider Note   CSN: [id number] Arrival date & time: 08/22/20  1911     History Chief Complaint  Patient presents with  . Chest Pain    Julie Graves is a 80 y.o. female.  HPI   Patient presented to the ED for evaluation of upper abdominal and chest pain.  Patient states she initially started having pain in her upper abdomen.  The pain intensified throughout the day.  It moved up to her left chest.  Patient also feels it in her back.  She denies any fevers or chills.  No cough.  She has been having episodes of nausea and vomiting.  Patient symptoms are severe and she cannot get comfortable.  Past Medical History:  Diagnosis Date  . Anginal pain ([place])    "small pain?nerves in chest"started 10/05/13   . Anginal pain ([place])    cleared by cardiology 3/15  . Anxiety    rx given recent not taken yet  . Aortic insufficiency    mild on 9/11 cath  . CAD (coronary artery disease)    nonobstructive let heart cath 3/11: 40% ostial D1, 30% mid CFX  . CHF (congestive heart failure) ([place])   . Colon polyps 02/01/2009    MULTIPLE FRAGMENTS OF TUBULAR ADENOMAS  . Diabetes mellitus   . Frozen shoulder   . GERD (gastroesophageal reflux disease)    occ  . History of blood transfusion    pregnancy  . HLD (hyperlipidemia)   . HTN (hypertension)    ACEI cough  . Hx of cardiovascular stress test    Lexiscan Myoview (10/2013):  Fixed ant defect most c/w breast attenuation, cannot exclude apical infarct; no ischemia, EF 46%; Low Risk  . Nonischemic cardiomyopathy ([place])    Ech 3/11 difficult study, moderate aortic insuficiency noted. Left evntriculogram 3/11  . Obese   . Osteoarthritis   . Palpitation    PACs noted on telemetry while in the hospital  . Pneumonia    hx  . Stress incontinence     Patient Active Problem List   Diagnosis Date Noted  . Arthritis of left knee 05/13/2020  . S/P total knee arthroplasty, left 05/13/2020  .  History of total knee arthroplasty, right 03/31/2020  . Precordial pain   . Angina pectoris ([place]) 08/13/2017  . Mixed dyslipidemia 08/13/2017  . CAD (coronary artery disease), native coronary artery 08/13/2017  . Mild episode of recurrent major depressive disorder ([place]) 11/03/2013  . Urge incontinence 04/02/2013  . Spinal stenosis, lumbar region, without neurogenic claudication 10/27/2012  . Spinal stenosis of lumbar region 10/01/2012  . Allergic rhinitis 09/26/2011  . Dizziness 01/23/2011  . Dyspnea 01/23/2011  . Type II diabetes mellitus ([place]) 06/27/2010  . Morbid obesity ([place]) 06/27/2010  . Impaired glucose tolerance 06/27/2010  . Disorder of bone and cartilage 06/22/2010  . Primary cardiomyopathy ([place]) 03/23/2010  . HYPERLIPIDEMIA-MIXED 03/07/2010  . Essential hypertension 03/07/2010  . CARDIOMEGALY 11/30/2009  . OTHER DYSPNEA AND RESPIRATORY ABNORMALITIES 11/30/2009  . AORTIC REGURGITATION 09/28/2009  . Chronic diastolic heart failure ([place]) 09/28/2009  . Aortic regurgitation 09/28/2009  . 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 Osteoarthrosis involving multiple sites 10/26/2008    Past Surgical History:  Procedure Laterality Date  . ABDOMINAL HYSTERECTOMY    . APPENDECTOMY    . BLADDER NECK SUSPENSION  85  . CARDIAC CATHETERIZATION    . EYE SURGERY    . HAND SURGERY     right "had knots cut out"  .  KNEE ARTHROPLASTY Right 12/09/2013   Procedure: COMPUTER ASSISTED Right TOTAL KNEE ARTHROPLASTY;  Surgeon: Marybelle Killings, MD;  Location: [facility];  Service: Orthopedics;  Laterality: Right;  . LEFT HEART CATH AND CORONARY ANGIOGRAPHY N/A 08/15/2017   Procedure: LEFT HEART CATH AND CORONARY ANGIOGRAPHY;  Surgeon: Jettie Booze, MD;  Location: [facility];  Service: Cardiovascular;  Laterality: N/A;  . TOTAL KNEE ARTHROPLASTY Left 05/13/2020   Procedure: LEFT TOTAL KNEE ARTHROPLASTY;  Surgeon: Marybelle Killings, MD;  Location: [facility];  Service: Orthopedics;  Laterality: Left;   . TUBAL LIGATION       OB History   No obstetric history on file.     Family History  Problem Relation Age of Onset  . Heart attack Father   . Heart attack Sister   . Stroke Mother 68  . Diabetes Sister   . Heart disease Sister   . Diabetes Brother   . Colon cancer Brother   . Breast cancer Neg Hx     Social History   Tobacco Use  . Smoking status: Former Smoker    Packs/day: 1.00    Years: 15.00    Pack years: 15.00    Types: Cigarettes    Quit date: 10/10/1983    Years since quitting: 36.8  . Smokeless tobacco: Never Used  . Tobacco comment: Quit 1970s  Vaping Use  . Vaping Use: Never used  Substance Use Topics  . Alcohol use: No    Alcohol/week: 0.0 standard drinks  . Drug use: No    Home Medications Prior to Admission medications   Medication Sig Start Date End Date Taking? Authorizing Provider  acetaminophen (TYLENOL) 500 MG tablet Take 1,000 mg by mouth every 6 (six) hours as needed for moderate pain or headache.     [provider]  alendronate (FOSAMAX) 70 MG tablet Take 70 mg by mouth every Monday. Take with a full glass of water on an empty stomach. Take on mondays    [provider]  aspirin EC 81 MG tablet Take 81 mg by mouth daily. Swallow whole.    [provider]  Calcium Carb-Cholecalciferol (CALCIUM 600/VITAMIN D3 PO) Take 1 tablet by mouth daily.     [provider]  carvedilol (COREG) 6.25 MG tablet Take 6.25 mg by mouth 2 (two) times daily.    [provider]  Cholecalciferol (VITAMIN D) 2000 units CAPS Take 2,000 Units by mouth daily.    [provider]  diazepam (VALIUM) 2 MG tablet Take 2 mg by mouth daily as needed for anxiety.  11/25/19   [provider]  EPINEPHrine 0.3 mg/0.3 mL IJ SOAJ injection Inject 0.3 mLs (0.3 mg total) into the muscle as needed for anaphylaxis. 10/14/18   Horton, Barbette Hair, MD  fluticasone (FLONASE) 50 MCG/ACT nasal spray Place 1 spray into both nostrils  daily as needed for allergies or rhinitis.    [provider]  furosemide (LASIX) 40 MG tablet Take 40 mg by mouth daily as needed for fluid or edema.  Patient not taking: No sig reported    [provider]  HYDROcodone-acetaminophen (NORCO/VICODIN) 5-325 MG tablet Take 1 tablet by mouth every 6 (six) hours as needed for moderate pain. 07/29/20   Marybelle Killings, MD  JARDIANCE 10 MG TABS tablet TAKE 1 TABLET BY MOUTH DAILY BEFORE BREAKFAST 05/02/20   Adrian Prows, MD  losartan (COZAAR) 100 MG tablet Take 1 tablet (100 mg total) by mouth daily. 09/09/19  Adrian Prows, MD  metFORMIN (GLUCOPHAGE-XR) 500 MG 24 hr tablet Take 500 mg by mouth daily. 07/08/19   [provider]  nitroGLYCERIN (NITROSTAT) 0.4 MG SL tablet Place 0.4 mg under the tongue every 5 (five) minutes as needed for chest pain. 02/25/20   [provider]  oxyCODONE (OXY IR/ROXICODONE) 5 MG immediate release tablet Take 5 mg by mouth 2 (two) times daily as needed. Patient not taking: Reported on 07/29/2020 06/17/20   [provider]  oxyCODONE-acetaminophen (PERCOCET/ROXICET) 5-325 MG tablet Take 1 tablet by mouth every 4 (four) hours as needed for severe pain. Patient not taking: Reported on 07/29/2020 05/13/20   Lanae Crumbly, PA-C  potassium chloride SA (K-DUR) 20 MEQ tablet Take 20 mEq by mouth daily as needed (when taking furosemide).  01/27/19   [provider]  simvastatin (ZOCOR) 40 MG tablet Take 40 mg by mouth at bedtime.    [provider]  spironolactone (ALDACTONE) 25 MG tablet TAKE 1 TABLET(25 MG) BY MOUTH DAILY Patient taking differently: Take 25 mg by mouth daily. 03/01/20   Adrian Prows, MD    Allergies    Iodine, Shellfish allergy, Topiramate, Metamucil [psyllium], Lisinopril, Penicillins, and Tramadol  Review of Systems   Review of Systems  All other systems reviewed and are negative.   Physical Exam Updated Vital Signs BP 139/62   Pulse 65   Temp (!) 97.5 F (36.4  C) (Oral)   Resp (!) 26   Ht 1.524 m (5')   Wt 90.7 kg   SpO2 98%   BMI 39.05 kg/m   Physical Exam Vitals and nursing note reviewed.  Constitutional:      General: She is not in acute distress.    Appearance: She is well-developed and well-nourished. She is ill-appearing.  HENT:     Head: Normocephalic and atraumatic.     Right Ear: External ear normal.     Left Ear: External ear normal.  Eyes:     General: No scleral icterus.       Right eye: No discharge.        Left eye: No discharge.     Conjunctiva/sclera: Conjunctivae normal.  Neck:     Trachea: No tracheal deviation.  Cardiovascular:     Rate and Rhythm: Normal rate and regular rhythm.     Pulses: Intact distal pulses.  Pulmonary:     Effort: Pulmonary effort is normal. No respiratory distress.     Breath sounds: Normal breath sounds. No stridor. No wheezing or rales.  Abdominal:     General: Bowel sounds are normal. There is no distension.     Palpations: Abdomen is soft.     Tenderness: There is abdominal tenderness in the epigastric area. There is guarding. There is no rebound.     Hernia: No hernia is present.  Musculoskeletal:        General: No tenderness or edema.     Cervical back: Neck supple.  Skin:    General: Skin is warm and dry.     Findings: No rash.  Neurological:     Mental Status: She is alert.     Cranial Nerves: No cranial nerve deficit (no facial droop, extraocular movements intact, no slurred speech).     Sensory: No sensory deficit.     Motor: No abnormal muscle tone or seizure activity.     Coordination: Coordination normal.     Deep Tendon Reflexes: Strength normal.  Psychiatric:  Mood and Affect: Mood and affect normal.     ED Results / Procedures / Treatments   Labs (all labs ordered are listed, but only abnormal results are displayed) Labs Reviewed  BASIC METABOLIC PANEL - Abnormal; Notable for the following components:      Result Value   Glucose, Bld 122 (*)     All other components within normal limits  CBC - Abnormal; Notable for the following components:   WBC 10.9 (*)    All other components within normal limits  HEPATIC FUNCTION PANEL - Abnormal; Notable for the following components:   Total Bilirubin 1.5 (*)    Bilirubin, Direct 0.4 (*)    Indirect Bilirubin 1.1 (*)    All other components within normal limits  TROPONIN I (HIGH SENSITIVITY) - Abnormal; Notable for the following components:   Troponin I (High Sensitivity) 79 (*)    All other components within normal limits  LIPASE, BLOOD  TROPONIN I (HIGH SENSITIVITY)    EKG None  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 EXAM: CHEST - 2 VIEW COMPARISON:  08/13/2017 FINDINGS: The heart size is stable but enlarged. Aortic calcifications are noted. There is no pneumothorax or large pleural effusion. No focal infiltrate. No acute osseous abnormality. IMPRESSION: No active cardiopulmonary disease. Electronically Signed   By: Constance Holster M.D.   On: 08/22/2020 19:49    Procedures .Critical Care Performed by: Dorie Rank, MD Authorized by: Dorie Rank, MD   Critical care provider statement:    Critical care time (minutes):  45   Critical care was time spent personally by me on the following activities:  Discussions with consultants, evaluation of patient's response to treatment, examination of patient, ordering and performing treatments and interventions, ordering and review of laboratory studies, ordering and review of radiographic studies, pulse oximetry, re-evaluation of patient's condition, obtaining history from patient or surrogate and review of old charts     Medications Ordered in ED Medications  HYDROmorphone (DILAUDID) injection 0.5 mg (0.5 mg Intravenous Given 08/22/20 2214)  sodium chloride 0.9 % bolus 1,000 mL (1,000 mLs Intravenous New  Bag/Given 08/22/20 2212)    Followed by  0.9 %  sodium chloride infusion (1,000 mLs Intravenous New Bag/Given 08/22/20 2213)  ondansetron (ZOFRAN) injection 4 mg (4 mg Intravenous Given 08/22/20 2213)    ED Course  I have reviewed the triage vital signs and the nursing notes.  Pertinent labs & imaging results that were available during my care of the patient were reviewed by me and considered in my medical decision making (see chart for details).  Clinical Course as of 08/22/20 2326  Mon Aug 22, 2020  2324 Patient's hepatic function panel does show elevated bilirubin.  Patient second troponin is elevated.  Lipase is normal [JK]  2324 We will give an aspirin with the increased delta troponin. [QV]  9563 However I am concerned started on abdominal pathology focal abdominal tenderness.  CT scan is pending. [JK]    Clinical Course User Index [JK] Dorie Rank, MD   MDM Rules/Calculators/A&P                          Patient presents to the ED with complaints of abdominal pain rating up into her chest.  Differential included abdominal obstruction, pancreatitis, cholecystitis, acute coronary syndrome, aortic dissection.  Patient did have  focal tenderness on exam.  More concerned about the possibility of an abdominal etiology.  Initial troponin was normal however delta troponin is slightly increased.  We will give her an aspirin but considering the persistent concern for vascular etiology will hold off on any anticoagulation.  Pain meds have been ordered.  CT angiogram of the chest abdomen pelvis have been ordered.  Care turned over to Dr. Randal Buba.  Anticipate patient will require admission for further evaluation. Final Clinical Impression(s) / ED Diagnoses pending   Dorie Rank, MD 08/22/20 [phone]

## 2020-08-22 NOTE — ED Notes (Signed)
Critical Value: Troponin 79 Provider notified: Tomi Bamberger, EDP

## 2020-08-22 NOTE — ED Provider Notes (Incomplete)
[facility] DEPT Provider Note   CSN: [id number] Arrival date & time: 08/22/20  1911     History Chief Complaint  Patient presents with  . Chest Pain    Julie Graves is a 80 y.o. female.  HPI   Pt started having pain in her stomach last evening.  The pain was in the upper abdomen above her belly button.  The pain continued throughout the night into today.  The pain goes up into her chest and into her back.  SHe has been vomiting up foam mucus.  No diarrhea.  No constipation.  No dysuria. She has had an appendectomy and hysterectomy.  Past Medical History:  Diagnosis Date  . Anginal pain ([place])    "small pain?nerves in chest"started 10/05/13   . Anginal pain ([place])    cleared by cardiology 3/15  . Anxiety    rx given recent not taken yet  . Aortic insufficiency    mild on 9/11 cath  . CAD (coronary artery disease)    nonobstructive let heart cath 3/11: 40% ostial D1, 30% mid CFX  . CHF (congestive heart failure) ([place])   . Colon polyps 02/01/2009    MULTIPLE FRAGMENTS OF TUBULAR ADENOMAS  . Diabetes mellitus   . Frozen shoulder   . GERD (gastroesophageal reflux disease)    occ  . History of blood transfusion    pregnancy  . HLD (hyperlipidemia)   . HTN (hypertension)    ACEI cough  . Hx of cardiovascular stress test    Lexiscan Myoview (10/2013):  Fixed ant defect most c/w breast attenuation, cannot exclude apical infarct; no ischemia, EF 46%; Low Risk  . Nonischemic cardiomyopathy ([place])    Ech 3/11 difficult study, moderate aortic insuficiency noted. Left evntriculogram 3/11  . Obese   . Osteoarthritis   . Palpitation    PACs noted on telemetry while in the hospital  . Pneumonia    hx  . Stress incontinence     Patient Active Problem List   Diagnosis Date Noted  . Arthritis of left knee 05/13/2020  . S/P total knee arthroplasty, left 05/13/2020  . History of total knee arthroplasty, right 03/31/2020  . Precordial pain   .  Angina pectoris ([place]) 08/13/2017  . Mixed dyslipidemia 08/13/2017  . CAD (coronary artery disease), native coronary artery 08/13/2017  . Mild episode of recurrent major depressive disorder ([place]) 11/03/2013  . Urge incontinence 04/02/2013  . Spinal stenosis, lumbar region, without neurogenic claudication 10/27/2012  . Spinal stenosis of lumbar region 10/01/2012  . Allergic rhinitis 09/26/2011  . Dizziness 01/23/2011  . Dyspnea 01/23/2011  . Type II diabetes mellitus ([place]) 06/27/2010  . Morbid obesity ([place]) 06/27/2010  . Impaired glucose tolerance 06/27/2010  . Disorder of bone and cartilage 06/22/2010  . Primary cardiomyopathy ([place]) 03/23/2010  . HYPERLIPIDEMIA-MIXED 03/07/2010  . Essential hypertension 03/07/2010  . CARDIOMEGALY 11/30/2009  . OTHER DYSPNEA AND RESPIRATORY ABNORMALITIES 11/30/2009  . AORTIC REGURGITATION 09/28/2009  . Chronic diastolic heart failure ([place]) 09/28/2009  . Aortic regurgitation 09/28/2009  . 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 Osteoarthrosis involving multiple sites 10/26/2008    Past Surgical History:  Procedure Laterality Date  . ABDOMINAL HYSTERECTOMY    . APPENDECTOMY    . BLADDER NECK SUSPENSION  85  . CARDIAC CATHETERIZATION    . EYE SURGERY    . HAND SURGERY     right "had knots cut out"  . KNEE ARTHROPLASTY Right 12/09/2013   Procedure: COMPUTER ASSISTED Right  TOTAL KNEE ARTHROPLASTY;  Surgeon: Marybelle Killings, MD;  Location: [facility];  Service: Orthopedics;  Laterality: Right;  . LEFT HEART CATH AND CORONARY ANGIOGRAPHY N/A 08/15/2017   Procedure: LEFT HEART CATH AND CORONARY ANGIOGRAPHY;  Surgeon: Jettie Booze, MD;  Location: [facility];  Service: Cardiovascular;  Laterality: N/A;  . TOTAL KNEE ARTHROPLASTY Left 05/13/2020   Procedure: LEFT TOTAL KNEE ARTHROPLASTY;  Surgeon: Marybelle Killings, MD;  Location: [facility];  Service: Orthopedics;  Laterality: Left;  . TUBAL LIGATION       OB History   No obstetric history on file.      Family History  Problem Relation Age of Onset  . Heart attack Father   . Heart attack Sister   . Stroke Mother 69  . Diabetes Sister   . Heart disease Sister   . Diabetes Brother   . Colon cancer Brother   . Breast cancer Neg Hx     Social History   Tobacco Use  . Smoking status: Former Smoker    Packs/day: 1.00    Years: 15.00    Pack years: 15.00    Types: Cigarettes    Quit date: 10/10/1983    Years since quitting: 36.8  . Smokeless tobacco: Never Used  . Tobacco comment: Quit 1970s  Vaping Use  . Vaping Use: Never used  Substance Use Topics  . Alcohol use: No    Alcohol/week: 0.0 standard drinks  . Drug use: No    Home Medications Prior to Admission medications   Medication Sig Start Date End Date Taking? Authorizing Provider  acetaminophen (TYLENOL) 500 MG tablet Take 1,000 mg by mouth every 6 (six) hours as needed for moderate pain or headache.     [provider]  alendronate (FOSAMAX) 70 MG tablet Take 70 mg by mouth every Monday. Take with a full glass of water on an empty stomach. Take on mondays    [provider]  aspirin EC 81 MG tablet Take 81 mg by mouth daily. Swallow whole.    [provider]  Calcium Carb-Cholecalciferol (CALCIUM 600/VITAMIN D3 PO) Take 1 tablet by mouth daily.     [provider]  carvedilol (COREG) 6.25 MG tablet Take 6.25 mg by mouth 2 (two) times daily.    [provider]  Cholecalciferol (VITAMIN D) 2000 units CAPS Take 2,000 Units by mouth daily.    [provider]  diazepam (VALIUM) 2 MG tablet Take 2 mg by mouth daily as needed for anxiety.  11/25/19   [provider]  EPINEPHrine 0.3 mg/0.3 mL IJ SOAJ injection Inject 0.3 mLs (0.3 mg total) into the muscle as needed for anaphylaxis. 10/14/18   Horton, Barbette Hair, MD  fluticasone (FLONASE) 50 MCG/ACT nasal spray Place 1 spray into both nostrils daily as needed for allergies or rhinitis.    [provider]   furosemide (LASIX) 40 MG tablet Take 40 mg by mouth daily as needed for fluid or edema.  Patient not taking: No sig reported    [provider]  HYDROcodone-acetaminophen (NORCO/VICODIN) 5-325 MG tablet Take 1 tablet by mouth every 6 (six) hours as needed for moderate pain. 07/29/20   Marybelle Killings, MD  JARDIANCE 10 MG TABS tablet TAKE 1 TABLET BY MOUTH DAILY BEFORE BREAKFAST 05/02/20   Adrian Prows, MD  losartan (COZAAR) 100 MG tablet Take 1 tablet (100 mg total) by mouth daily. 09/09/19   Adrian Prows, MD  metFORMIN (GLUCOPHAGE-XR) 500 MG 24  hr tablet Take 500 mg by mouth daily. 07/08/19   [provider]  nitroGLYCERIN (NITROSTAT) 0.4 MG SL tablet Place 0.4 mg under the tongue every 5 (five) minutes as needed for chest pain. 02/25/20   [provider]  oxyCODONE (OXY IR/ROXICODONE) 5 MG immediate release tablet Take 5 mg by mouth 2 (two) times daily as needed. Patient not taking: Reported on 07/29/2020 06/17/20   [provider]  oxyCODONE-acetaminophen (PERCOCET/ROXICET) 5-325 MG tablet Take 1 tablet by mouth every 4 (four) hours as needed for severe pain. Patient not taking: Reported on 07/29/2020 05/13/20   Lanae Crumbly, PA-C  potassium chloride SA (K-DUR) 20 MEQ tablet Take 20 mEq by mouth daily as needed (when taking furosemide).  01/27/19   [provider]  simvastatin (ZOCOR) 40 MG tablet Take 40 mg by mouth at bedtime.    [provider]  spironolactone (ALDACTONE) 25 MG tablet TAKE 1 TABLET(25 MG) BY MOUTH DAILY Patient taking differently: Take 25 mg by mouth daily. 03/01/20   Adrian Prows, MD    Allergies    Iodine, Shellfish allergy, Topiramate, Metamucil [psyllium], Lisinopril, Penicillins, and Tramadol  Review of Systems   Review of Systems  All other systems reviewed and are negative.   Physical Exam Updated Vital Signs BP 137/74 (BP Location: Right Arm)   Pulse (!) 58   Temp (!) 97.5 F (36.4 C) (Oral)   Resp 20   SpO2 100%    Physical Exam Vitals and nursing note reviewed.  Constitutional:      Appearance: She is well-developed and well-nourished. She is ill-appearing.  HENT:     Head: Normocephalic and atraumatic.     Right Ear: External ear normal.     Left Ear: External ear normal.  Eyes:     General: No scleral icterus.       Right eye: No discharge.        Left eye: No discharge.     Conjunctiva/sclera: Conjunctivae normal.  Neck:     Trachea: No tracheal deviation.  Cardiovascular:     Rate and Rhythm: Normal rate and regular rhythm.     Pulses: Intact distal pulses.  Pulmonary:     Effort: Pulmonary effort is normal. No respiratory distress.     Breath sounds: Normal breath sounds. No stridor. No wheezing or rales.  Abdominal:     General: Bowel sounds are normal. There is no distension.     Palpations: Abdomen is soft.     Tenderness: There is generalized abdominal tenderness and tenderness in the epigastric area. There is no guarding or rebound.     Hernia: No hernia is present.  Musculoskeletal:        General: No tenderness or edema.     Cervical back: Neck supple.  Skin:    General: Skin is warm and dry.     Findings: No rash.  Neurological:     Mental Status: She is alert.     Cranial Nerves: No cranial nerve deficit (no facial droop, extraocular movements intact, no slurred speech).     Sensory: No sensory deficit.     Motor: No abnormal muscle tone or seizure activity.     Coordination: Coordination normal.     Deep Tendon Reflexes: Strength normal.  Psychiatric:        Mood and Affect: Mood and affect normal.     ED Results / Procedures / Treatments   Labs (all labs ordered are listed, but only abnormal results  are displayed) Labs Reviewed  BASIC METABOLIC PANEL - Abnormal; Notable for the following components:      Result Value   Glucose, Bld 122 (*)    All other components within normal limits  CBC - Abnormal; Notable for the following components:   WBC 10.9 (*)     All other components within normal limits  TROPONIN I (HIGH SENSITIVITY)  TROPONIN I (HIGH SENSITIVITY)    EKG None  Radiology DG Chest 2 View  Result Date: 08/22/2020 CLINICAL DATA:  Shortness of breath. EXAM: CHEST - 2 VIEW COMPARISON:  08/13/2017 FINDINGS: The heart size is stable but enlarged. Aortic calcifications are noted. There is no pneumothorax or large pleural effusion. No focal infiltrate. No acute osseous abnormality. IMPRESSION: No active cardiopulmonary disease. Electronically Signed   By: Constance Holster M.D.   On: 08/22/2020 19:49    Procedures Procedures {Remember to document critical care time when appropriate:1}  Medications Ordered in ED Medications - No data to display  ED Course  I have reviewed the triage vital signs and the nursing notes.  Pertinent labs & imaging results that were available during my care of the patient were reviewed by me and considered in my medical decision making (see chart for details).    MDM Rules/Calculators/A&P                          *** Final Clinical Impression(s) / ED Diagnoses Final diagnoses:  None    Rx / DC Orders ED Discharge Orders    None

## 2020-08-23 ENCOUNTER — Emergency Department (HOSPITAL_COMMUNITY): Payer: Medicare Other

## 2020-08-23 ENCOUNTER — Other Ambulatory Visit: Payer: Self-pay

## 2020-08-23 ENCOUNTER — Inpatient Hospital Stay (HOSPITAL_COMMUNITY): Payer: Medicare Other

## 2020-08-23 ENCOUNTER — Encounter (HOSPITAL_COMMUNITY): Payer: Self-pay

## 2020-08-23 DIAGNOSIS — I350 Nonrheumatic aortic (valve) stenosis: Secondary | ICD-10-CM

## 2020-08-23 DIAGNOSIS — E782 Mixed hyperlipidemia: Secondary | ICD-10-CM | POA: Diagnosis present

## 2020-08-23 DIAGNOSIS — K5730 Diverticulosis of large intestine without perforation or abscess without bleeding: Secondary | ICD-10-CM | POA: Diagnosis present

## 2020-08-23 DIAGNOSIS — I25118 Atherosclerotic heart disease of native coronary artery with other forms of angina pectoris: Secondary | ICD-10-CM | POA: Diagnosis present

## 2020-08-23 DIAGNOSIS — K810 Acute cholecystitis: Secondary | ICD-10-CM | POA: Diagnosis present

## 2020-08-23 DIAGNOSIS — I5032 Chronic diastolic (congestive) heart failure: Secondary | ICD-10-CM | POA: Diagnosis not present

## 2020-08-23 DIAGNOSIS — I5033 Acute on chronic diastolic (congestive) heart failure: Secondary | ICD-10-CM | POA: Diagnosis present

## 2020-08-23 DIAGNOSIS — Z8701 Personal history of pneumonia (recurrent): Secondary | ICD-10-CM | POA: Diagnosis not present

## 2020-08-23 DIAGNOSIS — I10 Essential (primary) hypertension: Secondary | ICD-10-CM | POA: Diagnosis not present

## 2020-08-23 DIAGNOSIS — R778 Other specified abnormalities of plasma proteins: Secondary | ICD-10-CM | POA: Diagnosis not present

## 2020-08-23 DIAGNOSIS — I351 Nonrheumatic aortic (valve) insufficiency: Secondary | ICD-10-CM

## 2020-08-23 DIAGNOSIS — E119 Type 2 diabetes mellitus without complications: Secondary | ICD-10-CM | POA: Diagnosis present

## 2020-08-23 DIAGNOSIS — I110 Hypertensive heart disease with heart failure: Secondary | ICD-10-CM | POA: Diagnosis present

## 2020-08-23 DIAGNOSIS — R079 Chest pain, unspecified: Secondary | ICD-10-CM

## 2020-08-23 DIAGNOSIS — K219 Gastro-esophageal reflux disease without esophagitis: Secondary | ICD-10-CM | POA: Diagnosis present

## 2020-08-23 DIAGNOSIS — Z87891 Personal history of nicotine dependence: Secondary | ICD-10-CM | POA: Diagnosis not present

## 2020-08-23 DIAGNOSIS — I428 Other cardiomyopathies: Secondary | ICD-10-CM | POA: Diagnosis present

## 2020-08-23 DIAGNOSIS — Z79899 Other long term (current) drug therapy: Secondary | ICD-10-CM | POA: Diagnosis not present

## 2020-08-23 DIAGNOSIS — K8000 Calculus of gallbladder with acute cholecystitis without obstruction: Secondary | ICD-10-CM | POA: Diagnosis present

## 2020-08-23 DIAGNOSIS — Z6839 Body mass index (BMI) 39.0-39.9, adult: Secondary | ICD-10-CM | POA: Diagnosis not present

## 2020-08-23 DIAGNOSIS — I700 Atherosclerosis of aorta: Secondary | ICD-10-CM | POA: Diagnosis present

## 2020-08-23 DIAGNOSIS — Z7984 Long term (current) use of oral hypoglycemic drugs: Secondary | ICD-10-CM | POA: Diagnosis not present

## 2020-08-23 DIAGNOSIS — R7989 Other specified abnormal findings of blood chemistry: Secondary | ICD-10-CM

## 2020-08-23 DIAGNOSIS — M159 Polyosteoarthritis, unspecified: Secondary | ICD-10-CM | POA: Diagnosis present

## 2020-08-23 DIAGNOSIS — I248 Other forms of acute ischemic heart disease: Secondary | ICD-10-CM | POA: Diagnosis present

## 2020-08-23 DIAGNOSIS — F419 Anxiety disorder, unspecified: Secondary | ICD-10-CM | POA: Diagnosis present

## 2020-08-23 DIAGNOSIS — Z7982 Long term (current) use of aspirin: Secondary | ICD-10-CM | POA: Diagnosis not present

## 2020-08-23 DIAGNOSIS — K82A1 Gangrene of gallbladder in cholecystitis: Secondary | ICD-10-CM | POA: Diagnosis present

## 2020-08-23 DIAGNOSIS — A419 Sepsis, unspecified organism: Secondary | ICD-10-CM | POA: Diagnosis present

## 2020-08-23 DIAGNOSIS — Z20822 Contact with and (suspected) exposure to covid-19: Secondary | ICD-10-CM | POA: Diagnosis present

## 2020-08-23 LAB — ECHOCARDIOGRAM COMPLETE
AR max vel: 1.74 cm2
AV Area VTI: 1.73 cm2
AV Area mean vel: 1.68 cm2
AV Mean grad: 12.5 mmHg
AV Peak grad: 21 mmHg
Ao pk vel: 2.29 m/s
Area-P 1/2: 2.29 cm2
Height: 60 in
P 1/2 time: 283 msec
S' Lateral: 3.6 cm
Weight: 3199.32 oz

## 2020-08-23 LAB — RESP PANEL BY RT-PCR (FLU A&B, COVID) ARPGX2
Influenza A by PCR: NEGATIVE
Influenza B by PCR: NEGATIVE
SARS Coronavirus 2 by RT PCR: NEGATIVE

## 2020-08-23 LAB — BASIC METABOLIC PANEL
Anion gap: 14 (ref 5–15)
BUN: 10 mg/dL (ref 8–23)
CO2: 19 mmol/L — ABNORMAL LOW (ref 22–32)
Calcium: 8.9 mg/dL (ref 8.9–10.3)
Chloride: 106 mmol/L (ref 98–111)
Creatinine, Ser: 0.56 mg/dL (ref 0.44–1.00)
GFR, Estimated: >60 mL/min (ref >60)
Glucose, Bld: 89 mg/dL (ref 70–99)
Potassium: 3.7 mmol/L (ref 3.5–5.1)
Sodium: 139 mmol/L (ref 135–145)

## 2020-08-23 LAB — TROPONIN I (HIGH SENSITIVITY)
Troponin I (High Sensitivity): 161 ng/L (ref <18)
Troponin I (High Sensitivity): 180 ng/L (ref <18)
Troponin I (High Sensitivity): 192 ng/L (ref <18)
Troponin I (High Sensitivity): 181 ng/L (ref <18)

## 2020-08-23 LAB — MAGNESIUM: Magnesium: 2.1 mg/dL (ref 1.7–2.4)

## 2020-08-23 MED ORDER — SODIUM CHLORIDE 0.9 % IV SOLN: 1.0000 g | Freq: Once | INTRAVENOUS | Status: DC

## 2020-08-23 MED ORDER — METRONIDAZOLE IN NACL 5-0.79 MG/ML-% IV SOLN: 500.0000 mg | Freq: Once | INTRAVENOUS | Status: DC

## 2020-08-23 MED ORDER — HYDROCORTISONE NA SUCCINATE PF 250 MG IJ SOLR
200.0000 mg | Freq: Once | INTRAMUSCULAR | Status: AC
  Administered 2020-08-23: 200 mg via INTRAVENOUS
  Filled 2020-08-23: qty 200

## 2020-08-23 MED ORDER — POTASSIUM CHLORIDE 10 MEQ/100ML IV SOLN
10.0000 meq | INTRAVENOUS | Status: AC
  Administered 2020-08-23 (×2): 10 meq via INTRAVENOUS
  Filled 2020-08-23 (×2): qty 100

## 2020-08-23 MED ORDER — DIPHENHYDRAMINE HCL 50 MG/ML IJ SOLN
50.0000 mg | Freq: Once | INTRAMUSCULAR | Status: AC
  Administered 2020-08-23: 50 mg via INTRAVENOUS
  Filled 2020-08-23: qty 1

## 2020-08-23 MED ORDER — SODIUM CHLORIDE 0.9% FLUSH
3.0000 mL | Freq: Two times a day (BID) | INTRAVENOUS | Status: DC
  Administered 2020-08-25: 3 mL via INTRAVENOUS

## 2020-08-23 MED ORDER — DIPHENHYDRAMINE HCL 25 MG PO CAPS: 50.0000 mg | ORAL_CAPSULE | Freq: Once | ORAL | Status: AC

## 2020-08-23 MED ORDER — FAMOTIDINE IN NACL 20-0.9 MG/50ML-% IV SOLN
20.0000 mg | Freq: Once | INTRAVENOUS | Status: AC
  Administered 2020-08-23: 20 mg via INTRAVENOUS
  Filled 2020-08-23: qty 50

## 2020-08-23 MED ORDER — PERFLUTREN LIPID MICROSPHERE
1.0000 mL | INTRAVENOUS | Status: AC | PRN
  Administered 2020-08-23: 3 mL via INTRAVENOUS
  Filled 2020-08-23: qty 10

## 2020-08-23 MED ORDER — HYDROMORPHONE HCL 1 MG/ML IJ SOLN
0.5000 mg | INTRAMUSCULAR | Status: DC | PRN
  Administered 2020-08-23 – 2020-08-24 (×6): 0.5 mg via INTRAVENOUS
  Filled 2020-08-23: qty 1
  Filled 2020-08-23 (×5): qty 0.5

## 2020-08-23 MED ORDER — METOPROLOL TARTRATE 5 MG/5ML IV SOLN: 5.0000 mg | Freq: Four times a day (QID) | INTRAVENOUS | Status: DC | PRN

## 2020-08-23 MED ORDER — METRONIDAZOLE IN NACL 5-0.79 MG/ML-% IV SOLN
500.0000 mg | Freq: Once | INTRAVENOUS | Status: AC
  Administered 2020-08-23: 500 mg via INTRAVENOUS
  Filled 2020-08-23: qty 100

## 2020-08-23 MED ORDER — ONDANSETRON HCL 4 MG/2ML IJ SOLN: 4.0000 mg | Freq: Four times a day (QID) | INTRAMUSCULAR | Status: DC | PRN

## 2020-08-23 MED ORDER — ONDANSETRON HCL 4 MG PO TABS: 4.0000 mg | ORAL_TABLET | Freq: Four times a day (QID) | ORAL | Status: DC | PRN

## 2020-08-23 MED ORDER — SODIUM CHLORIDE 0.9 % IV SOLN
2.0000 g | Freq: Once | INTRAVENOUS | Status: AC
  Administered 2020-08-23: 2 g via INTRAVENOUS
  Filled 2020-08-23: qty 2

## 2020-08-23 MED ORDER — POLYETHYLENE GLYCOL 3350 17 G PO PACK
17.0000 g | PACK | Freq: Every day | ORAL | Status: DC | PRN
  Administered 2020-08-24 – 2020-08-25 (×2): 17 g via ORAL
  Filled 2020-08-23 (×2): qty 1

## 2020-08-23 MED ORDER — METRONIDAZOLE IN NACL 5-0.79 MG/ML-% IV SOLN
500.0000 mg | Freq: Three times a day (TID) | INTRAVENOUS | Status: DC
  Administered 2020-08-23 – 2020-08-26 (×8): 500 mg via INTRAVENOUS
  Filled 2020-08-23 (×8): qty 100

## 2020-08-23 MED ORDER — IOHEXOL 350 MG/ML SOLN
100.0000 mL | Freq: Once | INTRAVENOUS | Status: AC | PRN
  Administered 2020-08-23: 100 mL via INTRAVENOUS

## 2020-08-23 MED ORDER — SODIUM CHLORIDE 0.9 % IV SOLN
2.0000 g | Freq: Three times a day (TID) | INTRAVENOUS | Status: DC
  Administered 2020-08-23 – 2020-08-26 (×9): 2 g via INTRAVENOUS
  Filled 2020-08-23 (×11): qty 2

## 2020-08-23 MED ORDER — HEPARIN SODIUM (PORCINE) 5000 UNIT/ML IJ SOLN: 5000.0000 [IU] | Freq: Three times a day (TID) | INTRAMUSCULAR | Status: DC

## 2020-08-23 MED ORDER — METOPROLOL TARTRATE 5 MG/5ML IV SOLN
2.5000 mg | Freq: Four times a day (QID) | INTRAVENOUS | Status: DC
  Administered 2020-08-23 – 2020-08-24 (×3): 2.5 mg via INTRAVENOUS
  Filled 2020-08-23 (×5): qty 5

## 2020-08-23 NOTE — ED Provider Notes (Addendum)
received patient in sign out.  Could not proceed immediately with CTA ordered by Dr. Tomi Bamberger as patient was not premedicated, given her iodine allergy.  Meds ordered by me.  CTA pending in 4 hours from premedication.      Patient with elevated troponing with acalculous cholecystitis on CTA.  Antibiotics initiated in the ED.  Patient cannot receive zosyn secondary to PCN allergy.    Cefepime flagyl initiated for antibiotics coverage as Maxipime is 4th generation.  Patient has already received steroids and benadryl/pepcid.     630 am: case d/w Dr. Redmond Pulling of [place].  Please admit to medicine.  Patient does not need ultrasound.  CCS will consult this am.     Veatrice Kells, MD 08/23/20 [phone]

## 2020-08-23 NOTE — Progress Notes (Signed)
Pharmacy Antibiotic Note  Julie Graves is a 80 y.o. female admitted on 08/22/2020 with acalculous cholecystitis on CTA, CCS consulted.  Pharmacy has been consulted for cefepime dosing; MD dosing flagyl.  Plan: Cefepime 2g IV q8h Flagyl 500mg  IV q8h per MD Follow up renal function & cultures   Height: 5' (152.4 cm) Weight: 90.7 kg (199 lb 15.3 oz) IBW/kg (Calculated) : 45.5  Temp (24hrs), Avg:97.5 F (36.4 C), Min:97.1 F (36.2 C), Max:98 F (36.7 C)  Recent Labs  Lab 08/22/20 1929  WBC 10.9*  CREATININE 0.59    Estimated Creatinine Clearance: 57.3 mL/min (by C-G formula based on SCr of 0.59 mg/dL).    Allergies  Allergen Reactions  . Iodine Hives and Swelling  . Shellfish Allergy Swelling and Other (See Comments)    Swelling - throat and lips  . Topiramate     Other reaction(s): Chest Pain  . Metamucil [Psyllium]   . Lisinopril Other (See Comments)    cough  . Penicillins Hives, Rash and Other (See Comments)    Has patient had a PCN reaction causing immediate rash, facial/tongue/throat swelling, SOB or lightheadedness with hypotension: Unknown Has patient had a PCN reaction causing severe rash involving mucus membranes or skin necrosis: No Has patient had a PCN reaction that required hospitalization: No Has patient had a PCN reaction occurring within the last 10 years: No If all of the above answers are "NO", then may proceed with Cephalosporin use.   . Tramadol Other (See Comments)    confusion    Antimicrobials this admission: 3/1 Cefepime >> 3/1 Flagyl >>  Dose adjustments this admission:  Microbiology results: 3/1 COVID: neg 3/1 Flu: neg  Thank you for allowing pharmacy to be a part of this patient's care.  Peggyann Juba, PharmD, BCPS Pharmacy: [phone] 08/23/2020 8:44 AM

## 2020-08-23 NOTE — H&P (Addendum)
History and Physical        Hospital Admission Note Date: 08/23/2020  Patient name: Julie Graves Medical record number: [id number] Date of birth: 1940/11/29 Age: 80 y.o. Gender: female  PCP: Harrison Mons, PA    Chief Complaint    Chief Complaint  Patient presents with  . Chest Pain      HPI:   This is a 80 year old female with past medical history of hypertension, hyperlipidemia, NICM, chronic diastolic CHF, diabetes, mild-mod aortic insufficiency, chronic stable angina, appendectomy, hysterectomy who presented with upper abdominal pain and left-sided chest pain which started 2/27 overnight and persisted throughout the night prompting her to come to the ED.  Associated with left chest, back pain and vomiting. Currently feels very sore in her upper abdomen and without any chest pain or palpitations currently.    ED Course: Afebrile, tachypneic,  hemodynamically stable, on room air. Notable Labs: 6, K4.1, BUN 12, creatinine 0.59, AST 25, ALT 16, lipase 20 4, T bili 1.5, WBC 10.9, Hb 12.9, troponin 12->79-> 161. Notable Imaging: CT chest abdomen pelvis-positive for acute cholecystitis, colonic diverticulosis, aortic atherosclerosis.  RUQ US-acute cholecystitis.   Patient received aspirin, Benadryl, hydrocortisone (contrast allergy premedication), 1 L NS bolus with maintenance fluid, cefepime, metronidazole, Pepcid, dilaudid. General surgery was consulted.    Vitals:   08/23/20 0743 08/23/20 0856  BP:  129/90  Pulse:  88  Resp:  (!) 22  Temp: 98 F (36.7 C) 98.2 F (36.8 C)  SpO2:  100%     Review of Systems:  Review of Systems  All other systems reviewed and are negative.   Medical/Social/Family History   Past Medical History: Past Medical History:  Diagnosis Date  . Anginal pain ([place])    "small pain?nerves in chest"started 10/05/13   . Anginal pain ([place])     cleared by cardiology 3/15  . Anxiety    rx given recent not taken yet  . Aortic insufficiency    mild on 9/11 cath  . CAD (coronary artery disease)    nonobstructive let heart cath 3/11: 40% ostial D1, 30% mid CFX  . CHF (congestive heart failure) ([place])   . Colon polyps 02/01/2009    MULTIPLE FRAGMENTS OF TUBULAR ADENOMAS  . Diabetes mellitus   . Frozen shoulder   . GERD (gastroesophageal reflux disease)    occ  . History of blood transfusion    pregnancy  . HLD (hyperlipidemia)   . HTN (hypertension)    ACEI cough  . Hx of cardiovascular stress test    Lexiscan Myoview (10/2013):  Fixed ant defect most c/w breast attenuation, cannot exclude apical infarct; no ischemia, EF 46%; Low Risk  . Nonischemic cardiomyopathy ([place])    Ech 3/11 difficult study, moderate aortic insuficiency noted. Left evntriculogram 3/11  . Obese   . Osteoarthritis   . Palpitation    PACs noted on telemetry while in the hospital  . Pneumonia    hx  . Stress incontinence     Past Surgical History:  Procedure Laterality Date  . ABDOMINAL HYSTERECTOMY    . APPENDECTOMY    . BLADDER NECK SUSPENSION  85  . CARDIAC CATHETERIZATION    . EYE SURGERY    .  HAND SURGERY     right "had knots cut out"  . KNEE ARTHROPLASTY Right 12/09/2013   Procedure: COMPUTER ASSISTED Right TOTAL KNEE ARTHROPLASTY;  Surgeon: Marybelle Killings, MD;  Location: [facility];  Service: Orthopedics;  Laterality: Right;  . LEFT HEART CATH AND CORONARY ANGIOGRAPHY N/A 08/15/2017   Procedure: LEFT HEART CATH AND CORONARY ANGIOGRAPHY;  Surgeon: Jettie Booze, MD;  Location: [facility];  Service: Cardiovascular;  Laterality: N/A;  . TOTAL KNEE ARTHROPLASTY Left 05/13/2020   Procedure: LEFT TOTAL KNEE ARTHROPLASTY;  Surgeon: Marybelle Killings, MD;  Location: [facility];  Service: Orthopedics;  Laterality: Left;  . TUBAL LIGATION      Medications: Prior to Admission medications   Medication Sig Start Date End Date Taking? Authorizing  Provider  acetaminophen (TYLENOL) 500 MG tablet Take 1,000 mg by mouth every 6 (six) hours as needed for moderate pain or headache.     [provider]  alendronate (FOSAMAX) 70 MG tablet Take 70 mg by mouth every Monday. Take with a full glass of water on an empty stomach. Take on mondays    [provider]  aspirin EC 81 MG tablet Take 81 mg by mouth daily. Swallow whole.    [provider]  Calcium Carb-Cholecalciferol (CALCIUM 600/VITAMIN D3 PO) Take 1 tablet by mouth daily.     [provider]  carvedilol (COREG) 6.25 MG tablet Take 6.25 mg by mouth 2 (two) times daily.    [provider]  Cholecalciferol (VITAMIN D) 2000 units CAPS Take 2,000 Units by mouth daily.    [provider]  diazepam (VALIUM) 2 MG tablet Take 2 mg by mouth daily as needed for anxiety.  11/25/19   [provider]  EPINEPHrine 0.3 mg/0.3 mL IJ SOAJ injection Inject 0.3 mLs (0.3 mg total) into the muscle as needed for anaphylaxis. 10/14/18   Horton, Barbette Hair, MD  fluticasone (FLONASE) 50 MCG/ACT nasal spray Place 1 spray into both nostrils daily as needed for allergies or rhinitis.    [provider]  furosemide (LASIX) 40 MG tablet Take 40 mg by mouth daily as needed for fluid or edema.  Patient not taking: No sig reported    [provider]  HYDROcodone-acetaminophen (NORCO/VICODIN) 5-325 MG tablet Take 1 tablet by mouth every 6 (six) hours as needed for moderate pain. 07/29/20   Marybelle Killings, MD  JARDIANCE 10 MG TABS tablet TAKE 1 TABLET BY MOUTH DAILY BEFORE BREAKFAST 05/02/20   Adrian Prows, MD  losartan (COZAAR) 100 MG tablet Take 1 tablet (100 mg total) by mouth daily. 09/09/19   Adrian Prows, MD  metFORMIN (GLUCOPHAGE-XR) 500 MG 24 hr tablet Take 500 mg by mouth daily. 07/08/19   [provider]  nitroGLYCERIN (NITROSTAT) 0.4 MG SL tablet Place 0.4 mg under the tongue every 5 (five) minutes as needed for chest pain. 02/25/20   [provider]  oxyCODONE (OXY IR/ROXICODONE) 5 MG immediate release tablet Take 5 mg by mouth 2 (two) times daily as needed. Patient not taking: Reported on 07/29/2020 06/17/20   [provider]  oxyCODONE-acetaminophen (PERCOCET/ROXICET) 5-325 MG tablet Take 1 tablet by mouth every 4 (four) hours as needed for severe pain. Patient not taking: Reported on 07/29/2020 05/13/20   Lanae Crumbly, PA-C  potassium chloride SA (K-DUR) 20 MEQ tablet Take 20 mEq by mouth daily as needed (when taking furosemide).  01/27/19   [provider]  simvastatin (ZOCOR) 40 MG tablet Take  40 mg by mouth at bedtime.    [provider]  spironolactone (ALDACTONE) 25 MG tablet TAKE 1 TABLET(25 MG) BY MOUTH DAILY Patient taking differently: Take 25 mg by mouth daily. 03/01/20   Adrian Prows, MD    Allergies:   Allergies  Allergen Reactions  . Iodine Hives and Swelling  . Shellfish Allergy Swelling and Other (See Comments)    Swelling - throat and lips  . Topiramate     Other reaction(s): Chest Pain  . Metamucil [Psyllium]   . Lisinopril Other (See Comments)    cough  . Penicillins Hives, Rash and Other (See Comments)    Has patient had a PCN reaction causing immediate rash, facial/tongue/throat swelling, SOB or lightheadedness with hypotension: Unknown Has patient had a PCN reaction causing severe rash involving mucus membranes or skin necrosis: No Has patient had a PCN reaction that required hospitalization: No Has patient had a PCN reaction occurring within the last 10 years: No If all of the above answers are "NO", then may proceed with Cephalosporin use.   . Tramadol Other (See Comments)    confusion    Social History:  reports that she quit smoking about 36 years ago. Her smoking use included cigarettes. She has a 15.00 pack-year smoking history. She has never used smokeless tobacco. She reports that she does not drink alcohol and does not use drugs.  Family History: Family  History  Problem Relation Age of Onset  . Heart attack Father   . Heart attack Sister   . Stroke Mother 53  . Diabetes Sister   . Heart disease Sister   . Diabetes Brother   . Colon cancer Brother   . Breast cancer Neg Hx      Objective   Physical Exam: Blood pressure 129/90, pulse 88, temperature 98.2 F (36.8 C), resp. rate (!) 22, height 5' (1.524 m), weight 90.7 kg, SpO2 100 %.  Physical Exam Vitals and nursing note reviewed.  Constitutional:      Appearance: Normal appearance.     Comments: Appears uncomfortable  HENT:     Head: Normocephalic and atraumatic.  Eyes:     Conjunctiva/sclera: Conjunctivae normal.  Cardiovascular:     Rate and Rhythm: Normal rate and regular rhythm.     Heart sounds: Murmur heard.   Systolic murmur is present.   Pulmonary:     Effort: Pulmonary effort is normal.     Breath sounds: Normal breath sounds.  Abdominal:     General: Abdomen is flat.     Palpations: Abdomen is soft.  Musculoskeletal:        General: No swelling or tenderness.  Skin:    Coloration: Skin is not jaundiced or pale.  Neurological:     Mental Status: She is alert. Mental status is at baseline.  Psychiatric:        Mood and Affect: Mood normal.        Behavior: Behavior normal.     LABS on Admission: I have personally reviewed all the labs and imaging below    Basic Metabolic Panel: Recent Labs  Lab 08/22/20 1929  NA 136  K 4.1  CL 102  CO2 25  GLUCOSE 122*  BUN 12  CREATININE 0.59  CALCIUM 9.6   Liver Function Tests: Recent Labs  Lab 08/22/20 2200  AST 25  ALT 16  ALKPHOS 38  BILITOT 1.5*  PROT 7.9  ALBUMIN 4.4   Recent Labs  Lab 08/22/20 2200  LIPASE 24  No results for input(s): AMMONIA in the last 168 hours. CBC: Recent Labs  Lab 08/22/20 1929  WBC 10.9*  HGB 12.9  HCT 40.1  MCV 95.2  PLT 260   Cardiac Enzymes: No results for input(s): CKTOTAL, CKMB, CKMBINDEX, TROPONINI in the last 168 hours. BNP: Invalid  input(s): POCBNP CBG: No results for input(s): GLUCAP in the last 168 hours.  Radiological Exams on Admission:  DG Chest 2 View  Result Date: 08/22/2020 CLINICAL DATA:  Shortness of breath. EXAM: CHEST - 2 VIEW COMPARISON:  08/13/2017 FINDINGS: The heart size is stable but enlarged. Aortic calcifications are noted. There is no pneumothorax or large pleural effusion. No focal infiltrate. No acute osseous abnormality. IMPRESSION: No active cardiopulmonary disease. Electronically Signed   By: Constance Holster M.D.   On: 08/22/2020 19:49   CT Angio Chest/Abd/Pel for Dissection W and/or Wo Contrast  Result Date: 08/23/2020 CLINICAL DATA:  Abdominal pain with aortic dissection suspected EXAM: CT ANGIOGRAPHY CHEST, ABDOMEN AND PELVIS TECHNIQUE: Non-contrast CT of the chest was initially obtained. Multidetector CT imaging through the chest, abdomen and pelvis was performed using the standard protocol during bolus administration of intravenous contrast. Multiplanar reconstructed images and MIPs were obtained and reviewed to evaluate the vascular anatomy. CONTRAST:  130mL OMNIPAQUE IOHEXOL 350 MG/ML SOLN COMPARISON:  Abdomen and pelvis CT 01/22/2005 FINDINGS: CTA CHEST FINDINGS Cardiovascular: Preferential opacification of the thoracic aorta. No evidence of thoracic aortic aneurysm or dissection. Normal heart size. No pericardial effusion. Aortic and coronary atheromatous calcification. Mediastinum/Nodes: 13 mm nodule in the left thyroid. No followup recommended.(Ref: J Am Coll Radiol. 2015 Feb;12(2): 143-50). Lungs/Pleura: Mild dependent atelectasis. Assessment is limited by respiratory motion. There is no edema, consolidation, effusion, or pneumothorax. Musculoskeletal: Spondylosis Review of the MIP images confirms the above findings. CTA ABDOMEN AND PELVIS FINDINGS VASCULAR Aorta: Overall mild atheromatous plaque.  No aneurysm or dissection. Celiac: Plaque at the ostium without significant stenosis. No branch  occlusion, beading, or aneurysm. SMA: Patent without evidence of aneurysm, dissection, vasculitis or significant stenosis. Renals: Atheromatous plaque at the left renal artery ostium. No stenosis, beading, or are aneurysm. IMA: Patent Inflow: Atheromatous plaque. Veins: Unremarkable in the arterial phase Review of the MIP images confirms the above findings. NON-VASCULAR Hepatobiliary: Chronic liver steatosis. Higher density towards the gallbladder fossa could be hyperemia or sparing.Distended gallbladder with pericholecystic edema and wall thickening. No calcified gallstone detected. No bile duct dilatation. Pancreas: Unremarkable. Spleen: Unremarkable. Adrenals/Urinary Tract: Negative adrenals. No hydronephrosis or stone. Unremarkable bladder. Stomach/Bowel: No obstruction. No visible bowel inflammation. Innumerable colonic diverticula. Lymphatic: No mass or adenopathy. Reproductive:Hysterectomy Other: No ascites or pneumoperitoneum. Musculoskeletal: No acute abnormalities. Sacroiliac osteoarthritis with sclerosis. Lumbar spine degeneration with mild levoscoliosis. Review of the MIP images confirms the above findings. IMPRESSION: 1. Findings of acute cholecystitis but no calcified gallstones. Suggest right upper quadrant ultrasound. 2. No acute aortic syndrome. 3.  Aortic Atherosclerosis (ICD10-I70.0). 4. Extensive colonic diverticulosis. Electronically Signed   By: Monte Fantasia M.D.   On: 08/23/2020 06:25   US ABDOMEN LIMITED RUQ (LIVER/GB)  Result Date: 08/23/2020 CLINICAL DATA:  Right upper quadrant pain EXAM: ULTRASOUND ABDOMEN LIMITED RIGHT UPPER QUADRANT COMPARISON:  CT a of the chest abdomen pelvis from yesterday FINDINGS: Gallbladder: Confirmed multiple small gallstones with shadowing, measuring up to 5 mm. There is gallbladder wall thickening and pericholecystic edema. No sonographic Murphy sign. Common bile duct: Diameter: 6 mm. Liver: Echogenic liver with geographic sparing at the gallbladder  fossa. Portal vein is patent on color Doppler  imaging with normal direction of blood flow towards the liver. IMPRESSION: 1. Cholelithiasis, gallbladder distension, and wall thickening. Findings suggest acute cholecystitis, although there is no sonographic Murphy sign. Please correlate with analgesic history and initial exam. 2. Hepatic steatosis. Electronically Signed   By: Monte Fantasia M.D.   On: 08/23/2020 07:37      EKG: 2/28: normal sinus rhythm, PACs and ST changes in lateral leads Repeat EKGs 3/1: Left axis deviation, TWI lead V3 -V6, prolonged QTc up to 552 on repeat EKGs   A & P   Principal Problem:   Acute cholecystitis Active Problems:   Essential hypertension   Chronic diastolic heart failure (HCC)   Type II diabetes mellitus (HCC)   Chest pain   Mixed dyslipidemia   Elevated troponin   1. SIRS secondary to acute cholecystitis a. SIRS criteria: Tachypnea and borderline leukocytosis b. PCN allergy noted -continue cefepime and metronidazole c. General surgery consulted  d. N.p.o. in anticipation of surgery e. Attempting to get in contact with cardiology regarding presurgical clearance  2. Left-sided chest pain  elevated troponin  history of chronic stable angina a. Chest pain resolved b. Troponin 12->79-> 161 -> continue to trend c. Does not have a recent echo -> echo ordered d. Lopressor 2.5 mg Q6h while NPO e. Attempting to get into contact with [facility] cardiology however calls/pages have not been returned.  Will continue to reach out i. Addendum: discussed with Dr. Einar Gip who agrees to see in consult   3. Type 2 diabetes a. Glucose 122 b. Hold home meds c. Monitor while n.p.o.  4. NICM  Chronic diastolic CHF, not in acute exacerbation a. Holding home spironolactone, losartan, Lasix carvedilol while n.p.o. b. Daily weights and intake/output  5. Hyperlipidemia a. Holding home simvastatin  6. Hypertension a. Holding home meds    DVT prophylaxis:  SCDs   Code Status: Full Code  Diet: NPO Family Communication: Admission, patients condition and plan of care including tests being ordered have been discussed with the patient who indicates understanding and agrees with the plan and Code Status. Disposition Plan: The appropriate patient status for this patient is INPATIENT. Inpatient status is judged to be reasonable and necessary in order to provide the required intensity of service to ensure the patient's safety. The patient's presenting symptoms, physical exam findings, and initial radiographic and laboratory data in the context of their chronic comorbidities is felt to place them at high risk for further clinical deterioration. Furthermore, it is not anticipated that the patient will be medically stable for discharge from the hospital within 2 midnights of admission. The following factors support the patient status of inpatient.   " The patient's presenting symptoms include abdominal pain, chest pain. " The worrisome physical exam findings include abdominal pain. " The initial radiographic and laboratory data are worrisome because of elevated troponin, cholecystitis. " The chronic co-morbidities include chronic angina, chronic diastolic CHF.   * I certify that at the point of admission it is my clinical judgment that the patient will require inpatient hospital care spanning beyond 2 midnights from the point of admission due to high intensity of service, high risk for further deterioration and high frequency of surveillance required.*   Status is: Inpatient  Remains inpatient appropriate because:Ongoing active pain requiring inpatient pain management, Ongoing diagnostic testing needed not appropriate for outpatient work up, IV treatments appropriate due to intensity of illness or inability to take PO and Inpatient level of care appropriate due to severity of illness  Dispo: The patient is from: Home              Anticipated d/c is to: Home               Patient currently is not medically stable to d/c.   Difficult to place patient No    Consultants  . General Surgery . Cardiology  Procedures  . none  Time Spent on Admission: 67 minutes    Harold Hedge, DO [facility]  08/23/2020, 9:03 AM

## 2020-08-23 NOTE — Progress Notes (Signed)
CRITICAL VALUE STICKER  CRITICAL VALUE: Troponin 161  RECEIVER (on-site recipient of call): Garlon Hatchet, LPN  DATE & TIME NOTIFIED: 08/23/2020 at [phone]  MESSENGER (representative from lab): Tammy  MD NOTIFIED: Dr. Neysa Bonito   TIME OF NOTIFICATION: 1720  RESPONSE: waiting

## 2020-08-23 NOTE — Anesthesia Preprocedure Evaluation (Addendum)
Anesthesia Evaluation  Patient identified by MRN, date of birth, ID band Patient awake    Reviewed: Allergy & Precautions, NPO status , Patient's Chart, lab work & pertinent test results  History of Anesthesia Complications Negative for: history of anesthetic complications  Airway Mallampati: II  TM Distance: >3 FB Neck ROM: Full    Dental no notable dental hx. (+) Dental Advisory Given   Pulmonary neg pulmonary ROS, former smoker,    Pulmonary exam normal        Cardiovascular hypertension, Pt. on home beta blockers and Pt. on medications + angina + CAD and +CHF  Normal cardiovascular exam+ Valvular Problems/Murmurs AI and MR   IMPRESSIONS    1. Left ventricular ejection fraction, by estimation, is 50 to 55%. The  left ventricle has low normal function. The left ventricle has no regional  wall motion abnormalities. There is mild left ventricular hypertrophy.  Left ventricular diastolic  parameters were normal.  2. Right ventricular systolic function is normal. The right ventricular  size is normal.  3. Left atrial size was mildly dilated.  4. The mitral valve is normal in structure. Trivial mitral valve  regurgitation. No evidence of mitral stenosis.  5. The aortic valve was not well visualized. Aortic valve regurgitation  is mild. Mild aortic valve stenosis.  6. The inferior vena cava is normal in size with greater than 50%  respiratory variability, suggesting right atrial pressure of 3 mmHg.   Coronary angiogram 07/2017: mid circumflex 50%, D1 ostial 50% and mid 80%, very small. Mild noncritical CAD other vessels. EF 45%.   Neuro/Psych PSYCHIATRIC DISORDERS Anxiety Depression negative neurological ROS     GI/Hepatic Neg liver ROS, GERD  ,  Endo/Other  diabetes, Oral Hypoglycemic AgentsMorbid obesity (BMI 43)  Renal/GU negative Renal ROS  negative genitourinary   Musculoskeletal  (+) Arthritis ,    Abdominal   Peds  Hematology negative hematology ROS (+)   Anesthesia Other Findings   Reproductive/Obstetrics                            Anesthesia Physical  Anesthesia Plan  ASA: III  Anesthesia Plan: General   Post-op Pain Management:    Induction:   PONV Risk Score and Plan: 4 or greater and Treatment may vary due to age or medical condition, Ondansetron, Dexamethasone and Diphenhydramine  Airway Management Planned: Oral ETT  Additional Equipment:   Intra-op Plan:   Post-operative Plan: Extubation in OR  Informed Consent: I have reviewed the patients History and Physical, chart, labs and discussed the procedure including the risks, benefits and alternatives for the proposed anesthesia with the patient or authorized representative who has indicated his/her understanding and acceptance.     Dental advisory given  Plan Discussed with: Anesthesiologist, CRNA and Surgeon  Anesthesia Plan Comments: ( )       Anesthesia Quick Evaluation

## 2020-08-23 NOTE — ED Notes (Signed)
US at bedside

## 2020-08-23 NOTE — ED Notes (Signed)
Patient placed on pure wick °

## 2020-08-23 NOTE — Progress Notes (Signed)
  Echocardiogram 2D Echocardiogram has been performed.  Jennette Dubin 08/23/2020, 2:47 PM

## 2020-08-23 NOTE — Progress Notes (Signed)
Patient arrived to room 1536. She is oriented to room and unit. When patient first arrived to the unit I received a call from the lab with a critical result of troponin 161. EKG done, AMION page sent to Dr. Neysa Bonito, and notified charge nurse Apolonio Schneiders C. EKG placed in chart.   Patient Troponin continued to elevate. At 1009 patient troponin was 180 and at 1151 192. Notified Dr. Neysa Bonito both times. Was notified to continue to monitor patient and that cardiology was on board.   Dr. Einar Gip ordered another EKG was completed at 1529. Crtical results due to long QTc. Notified Dr. Virgina Jock and Dr. Neysa Bonito by [facility] page. Orders for lab work and to continue to monitor patient.   At every increase of patient troponin levels she denied chest pain and was in no distress.   Will continue to monitor patient.

## 2020-08-23 NOTE — H&P (View-Only) (Signed)
[facility] Surgery Consult Note  Julie Graves 1940/07/09  [id number].    Requesting MD: Dorie Rank Chief Complaint: upper abdominal pain and chest pain Reason for Consult: acute cholecystitis  HPI:  80 year old female with multiple medical problems presented with abdominal pain and chest pain.  She reports the pain started 2 days she says it was mostly in the midline and epigastric area.  She has been vomiting up a foamy mucus.  She has had a history of appendectomy/hysterectomy.  Currently she has pain primarily on the right side right upper quadrant significantly more so than the right lower quadrant.  Work-up in the ED she is afebrile, blood pressure mildly elevated.  Respiratory rates in the 20s.  Labs shows glucose 122, lipase 24, AST 25, ALT 16, total bilirubin 1.5.  WBC 10.5, hemoglobin 12.9, hematocrit 40.1, platelets 260,000.  Troponin 79, Covid negative CT angio of the chest abdomen pelvis for dissection: Shows chronic liver steatosis higher density towards the gallbladder fossa for being hyperemia or sparing.  Distended gallbladder with pericholecystic edema and wall thickening no calcified gallstones no bile duct dilatation.  No acute aortic syndrome, atherosclerosis, and extensive colonic diverticulosis. Right upper quadrant ultrasound shows multiple small gallstones with shadowing measuring up to 5 mm.  The gallbladder wall is thickened and pericholecystic edema.  Common bile duct is 6 mm.  Findings are consistent with cholelithiasis and acute cholecystitis.  Past medical history includes hypertension, hyperlipidemia, nonischemic cardiomyopathy, chronic diastolic congestive heart failure, diabetes, mild to moderate aortic insufficiency, chronic stable angina, obesity BMI 39.  We are asked to see.  ROS: Review of Systems  Constitutional: Positive for weight loss (22 lbs since last Knee surgery). Negative for chills and fever.  HENT: Negative.   Eyes: Negative.    Respiratory: Positive for shortness of breath (she feels she is anxious).   Cardiovascular: Positive for chest pain (not currently having chest pain, pain is in abdomen) and leg swelling. Negative for palpitations, orthopnea, claudication and PND.  Gastrointestinal: Positive for abdominal pain, heartburn, nausea and vomiting. Negative for blood in stool, constipation, diarrhea and melena.  Genitourinary: Negative.        Says she is incontinent and does not void much at 1 time.  Musculoskeletal:       She is recently had a second knee replacement is undergoing physical therapy to left knee.  Skin: Negative.   Neurological: Negative.   Endo/Heme/Allergies: Negative.   Psychiatric/Behavioral: The patient is nervous/anxious.     Family History  Problem Relation Age of Onset  . Heart attack Father   . Heart attack Sister   . Stroke Mother 49  . Diabetes Sister   . Heart disease Sister   . Diabetes Brother   . Colon cancer Brother   . Breast cancer Neg Hx     Past Medical History:  Diagnosis Date  . Anginal pain ([place])    "small pain?nerves in chest"started 10/05/13   . Anginal pain ([place])    cleared by cardiology 3/15  . Anxiety    rx given recent not taken yet  . Aortic insufficiency    mild on 9/11 cath  . CAD (coronary artery disease)    nonobstructive let heart cath 3/11: 40% ostial D1, 30% mid CFX  . CHF (congestive heart failure) ([place])   . Colon polyps 02/01/2009    MULTIPLE FRAGMENTS OF TUBULAR ADENOMAS  . Diabetes mellitus   . Frozen shoulder   . GERD (gastroesophageal reflux disease)  occ  . History of blood transfusion    pregnancy  . HLD (hyperlipidemia)   . HTN (hypertension)    ACEI cough  . Hx of cardiovascular stress test    Lexiscan Myoview (10/2013):  Fixed ant defect most c/w breast attenuation, cannot exclude apical infarct; no ischemia, EF 46%; Low Risk  . Nonischemic cardiomyopathy ([place])    Ech 3/11 difficult study, moderate aortic insuficiency  noted. Left evntriculogram 3/11  . Obese   . Osteoarthritis   . Palpitation    PACs noted on telemetry while in the hospital  . Pneumonia    hx  . Stress incontinence     Past Surgical History:  Procedure Laterality Date  . ABDOMINAL HYSTERECTOMY    . APPENDECTOMY    . BLADDER NECK SUSPENSION  85  . CARDIAC CATHETERIZATION    . EYE SURGERY    . HAND SURGERY     right "had knots cut out"  . KNEE ARTHROPLASTY Right 12/09/2013   Procedure: COMPUTER ASSISTED Right TOTAL KNEE ARTHROPLASTY;  Surgeon: Julie Killings, MD;  Location: [facility];  Service: Orthopedics;  Laterality: Right;  . LEFT HEART CATH AND CORONARY ANGIOGRAPHY N/A 08/15/2017   Procedure: LEFT HEART CATH AND CORONARY ANGIOGRAPHY;  Surgeon: Jettie Booze, MD;  Location: [facility];  Service: Cardiovascular;  Laterality: N/A;  . TOTAL KNEE ARTHROPLASTY Left 05/13/2020   Procedure: LEFT TOTAL KNEE ARTHROPLASTY;  Surgeon: Julie Killings, MD;  Location: [facility];  Service: Orthopedics;  Laterality: Left;  . TUBAL LIGATION      Social History:  reports that she quit smoking about 36 years ago. Her smoking use included cigarettes. She has a 15.00 pack-year smoking history. She has never used smokeless tobacco. She reports that she does not drink alcohol and does not use drugs.  Allergies:  Allergies  Allergen Reactions  . Iodine Hives and Swelling  . Shellfish Allergy Swelling and Other (See Comments)    Swelling - throat and lips  . Topiramate     Other reaction(s): Chest Pain  . Metamucil [Psyllium]   . Lisinopril Other (See Comments)    cough  . Penicillins Hives, Rash and Other (See Comments)    Has patient had a PCN reaction causing immediate rash, facial/tongue/throat swelling, SOB or lightheadedness with hypotension: Unknown Has patient had a PCN reaction causing severe rash involving mucus membranes or skin necrosis: No Has patient had a PCN reaction that required hospitalization: No Has patient had a PCN  reaction occurring within the last 10 years: No If all of the above answers are "NO", then may proceed with Cephalosporin use.   . Tramadol Other (See Comments)    confusion    Prior to Admission medications   Medication Sig Start Date End Date Taking? Authorizing Provider  acetaminophen (TYLENOL) 500 MG tablet Take 1,000 mg by mouth every 6 (six) hours as needed for moderate pain or headache.     [provider]  alendronate (FOSAMAX) 70 MG tablet Take 70 mg by mouth every Monday. Take with a full glass of water on an empty stomach. Take on mondays    [provider]  aspirin EC 81 MG tablet Take 81 mg by mouth daily. Swallow whole.    [provider]  Calcium Carb-Cholecalciferol (CALCIUM 600/VITAMIN D3 PO) Take 1 tablet by mouth daily.     [provider]  carvedilol (COREG) 6.25 MG tablet Take 6.25 mg by mouth 2 (two) times daily.  [provider]  Cholecalciferol (VITAMIN D) 2000 units CAPS Take 2,000 Units by mouth daily.    [provider]  diazepam (VALIUM) 2 MG tablet Take 2 mg by mouth daily as needed for anxiety.  11/25/19   [provider]  EPINEPHrine 0.3 mg/0.3 mL IJ SOAJ injection Inject 0.3 mLs (0.3 mg total) into the muscle as needed for anaphylaxis. 10/14/18   Horton, Barbette Hair, MD  fluticasone (FLONASE) 50 MCG/ACT nasal spray Place 1 spray into both nostrils daily as needed for allergies or rhinitis.    [provider]  furosemide (LASIX) 40 MG tablet Take 40 mg by mouth daily as needed for fluid or edema.  Patient not taking: No sig reported    [provider]  HYDROcodone-acetaminophen (NORCO/VICODIN) 5-325 MG tablet Take 1 tablet by mouth every 6 (six) hours as needed for moderate pain. 07/29/20   Julie Killings, MD  JARDIANCE 10 MG TABS tablet TAKE 1 TABLET BY MOUTH DAILY BEFORE BREAKFAST 05/02/20   Adrian Prows, MD  losartan (COZAAR) 100 MG tablet Take 1 tablet (100 mg total) by mouth daily.  09/09/19   Adrian Prows, MD  metFORMIN (GLUCOPHAGE-XR) 500 MG 24 hr tablet Take 500 mg by mouth daily. 07/08/19   [provider]  nitroGLYCERIN (NITROSTAT) 0.4 MG SL tablet Place 0.4 mg under the tongue every 5 (five) minutes as needed for chest pain. 02/25/20   [provider]  oxyCODONE (OXY IR/ROXICODONE) 5 MG immediate release tablet Take 5 mg by mouth 2 (two) times daily as needed. Patient not taking: Reported on 07/29/2020 06/17/20   [provider]  oxyCODONE-acetaminophen (PERCOCET/ROXICET) 5-325 MG tablet Take 1 tablet by mouth every 4 (four) hours as needed for severe pain. Patient not taking: Reported on 07/29/2020 05/13/20   Lanae Crumbly, PA-C  potassium chloride SA (K-DUR) 20 MEQ tablet Take 20 mEq by mouth daily as needed (when taking furosemide).  01/27/19   [provider]  simvastatin (ZOCOR) 40 MG tablet Take 40 mg by mouth at bedtime.    [provider]  spironolactone (ALDACTONE) 25 MG tablet TAKE 1 TABLET(25 MG) BY MOUTH DAILY Patient taking differently: Take 25 mg by mouth daily. 03/01/20   Adrian Prows, MD     Blood pressure (!) 142/73, pulse 87, temperature (!) 97.5 F (36.4 C), temperature source Oral, resp. rate 19, height 5' (1.524 m), weight 90.7 kg, SpO2 97 %. Physical Exam:  General: pleasant, obese AA female just transferred to the floor.,  Anxious, with ongoing abdominal pain. HEENT: head is normocephalic, atraumatic.  Sclera are noninjected.  Pupils are equal.  Ears and nose without any masses or lesions.  Mouth is pink and moist Heart: regular, slightly tachycardic.  Normal s1,s2. No obvious murmurs, gallops, or rubs noted.  Palpable radial and pedal pulses bilaterally Lungs: CTAB, no wheezes, rhonchi, or rales noted.  Respiratory effort nonlabored Abd: soft, she is sore all over her abdomen both the right and left sides but she is tender right lower quadrant and severely tender right upper quadrant.  ND, +BS, no masses, hernias,  or organomegaly MS: all 4 extremities are symmetrical with no cyanosis, clubbing.  +1 edema both lower extremities which appears to be chronic Skin: warm and dry with no masses, lesions, or rashes Neuro: Cranial nerves 2-12 grossly intact, sensation is normal throughout Psych: A&Ox3 with an appropriate affect.   Results for orders placed or performed during the hospital encounter of 08/22/20 (from the past 48 hour(s))  Basic metabolic panel     Status: Abnormal   Collection Time: 08/22/20  7:29 PM  Result Value Ref Range   Sodium 136 135 - 145 mmol/L   Potassium 4.1 3.5 - 5.1 mmol/L   Chloride 102 98 - 111 mmol/L   CO2 25 22 - 32 mmol/L   Glucose, Bld 122 (H) 70 - 99 mg/dL    Comment: Glucose reference range applies only to samples taken after fasting for at least 8 hours.   BUN 12 8 - 23 mg/dL   Creatinine, Ser 0.59 0.44 - 1.00 mg/dL   Calcium 9.6 8.9 - 10.3 mg/dL   GFR, Estimated >60 >60 mL/min    Comment: (NOTE) Calculated using the CKD-EPI Creatinine Equation (2021)    Anion gap 9 5 - 15    Comment: Performed at [facility], [place] [address], [place], [place] [address]  CBC     Status: Abnormal   Collection Time: 08/22/20  7:29 PM  Result Value Ref Range   WBC 10.9 (H) 4.0 - 10.5 K/uL   RBC 4.21 3.87 - 5.11 MIL/uL   Hemoglobin 12.9 12.0 - 15.0 g/dL   HCT 40.1 36.0 - 46.0 %   MCV 95.2 80.0 - 100.0 fL   MCH 30.6 26.0 - 34.0 pg   MCHC 32.2 30.0 - 36.0 g/dL   RDW 13.1 11.5 - 15.5 %   Platelets 260 150 - 400 K/uL   nRBC 0.0 0.0 - 0.2 %    Comment: Performed at [facility], [place] [address] [place], [place] [address]  Troponin I (High Sensitivity)     Status: None   Collection Time: 08/22/20  7:29 PM  Result Value Ref Range   Troponin I (High Sensitivity) 12 <18 ng/L    Comment: (NOTE) Elevated high sensitivity troponin I (hsTnI) values and significant  changes across serial measurements may suggest ACS but many other  chronic and acute  conditions are known to elevate hsTnI results.  Refer to the "Links" section for chest pain algorithms and additional  guidance. Performed at [facility], [place] [address] [place], [place] [address]   Troponin I (High Sensitivity)     Status: Abnormal   Collection Time: 08/22/20 10:00 PM  Result Value Ref Range   Troponin I (High Sensitivity) 79 (H) <18 ng/L    Comment: RESULT CALLED TO, READ BACK BY AND VERIFIED WITH: JESSICA LOWDERMILK @ 6578 ON 08/22/20 C VARNER DELTA CHECK NOTED (NOTE) Elevated high sensitivity troponin I (hsTnI) values and significant  changes across serial measurements may suggest ACS but many other  chronic and acute conditions are known to elevate hsTnI results.  Refer to the Links section for chest pain algorithms and additional  guidance. Performed at [facility], [place] [address] [place], [place] [address]   Lipase, blood     Status: None   Collection Time: 08/22/20 10:00 PM  Result Value Ref Range   Lipase 24 11 - 51 U/L    Comment: Performed at [facility], [place] [address] [place], [place] [address]  Hepatic function panel     Status: Abnormal   Collection Time: 08/22/20 10:00 PM  Result Value Ref Range   Total Protein 7.9 6.5 - 8.1 g/dL   Albumin 4.4 3.5 - 5.0 g/dL   AST 25 15 - 41 U/L   ALT 16 0 - 44 U/L   Alkaline Phosphatase 38 38 - 126 U/L   Total  Bilirubin 1.5 (H) 0.3 - 1.2 mg/dL   Bilirubin, Direct 0.4 (H) 0.0 - 0.2 mg/dL   Indirect Bilirubin 1.1 (H) 0.3 - 0.9 mg/dL    Comment: Performed at [facility], [place] [address], [place], [place] [address]  Resp Panel by RT-PCR (Flu A&B, Covid) Nasopharyngeal Swab     Status: None   Collection Time: 08/22/20 11:27 PM   Specimen: Nasopharyngeal Swab; Nasopharyngeal(NP) swabs in vial transport medium  Result Value Ref Range   SARS Coronavirus 2 by RT PCR NEGATIVE NEGATIVE    Comment: (NOTE) SARS-CoV-2 target nucleic acids  are NOT DETECTED.  The SARS-CoV-2 RNA is generally detectable in upper respiratory specimens during the acute phase of infection. The lowest concentration of SARS-CoV-2 viral copies this assay can detect is 138 copies/mL. A negative result does not preclude SARS-Cov-2 infection and should not be used as the sole basis for treatment or other patient management decisions. A negative result may occur with  improper specimen collection/handling, submission of specimen other than nasopharyngeal swab, presence of viral mutation(s) within the areas targeted by this assay, and inadequate number of viral copies(<138 copies/mL). A negative result must be combined with clinical observations, patient history, and epidemiological information. The expected result is Negative.  Fact Sheet for Patients:  EntrepreneurPulse.com.au  Fact Sheet for Healthcare Providers:  IncredibleEmployment.be  This test is no t yet approved or cleared by the [place] FDA and  has been authorized for detection and/or diagnosis of SARS-CoV-2 by FDA under an Emergency Use Authorization (EUA). This EUA will remain  in effect (meaning this test can be used) for the duration of the COVID-19 declaration under Section 564(b)(1) of the Act, 21 U.S.C.section 360bbb-3(b)(1), unless the authorization is terminated  or revoked sooner.       Influenza A by PCR NEGATIVE NEGATIVE   Influenza B by PCR NEGATIVE NEGATIVE    Comment: (NOTE) The Xpert Xpress SARS-CoV-2/FLU/RSV plus assay is intended as an aid in the diagnosis of influenza from Nasopharyngeal swab specimens and should not be used as a sole basis for treatment. Nasal washings and aspirates are unacceptable for Xpert Xpress SARS-CoV-2/FLU/RSV testing.  Fact Sheet for Patients: EntrepreneurPulse.com.au  Fact Sheet for Healthcare Providers: IncredibleEmployment.be  This test is not yet  approved or cleared by the [place] FDA and has been authorized for detection and/or diagnosis of SARS-CoV-2 by FDA under an Emergency Use Authorization (EUA). This EUA will remain in effect (meaning this test can be used) for the duration of the COVID-19 declaration under Section 564(b)(1) of the Act, 21 U.S.C. section 360bbb-3(b)(1), unless the authorization is terminated or revoked.  Performed at [facility], [place] [address] [place],  [address]    DG Chest 2 View  Result Date: 08/22/2020 CLINICAL DATA:  Shortness of breath. EXAM: CHEST - 2 VIEW COMPARISON:  08/13/2017 FINDINGS: The heart size is stable but enlarged. Aortic calcifications are noted. There is no pneumothorax or large pleural effusion. No focal infiltrate. No acute osseous abnormality. IMPRESSION: No active cardiopulmonary disease. Electronically Signed   By: Constance Holster M.D.   On: 08/22/2020 19:49   CT Angio Chest/Abd/Pel for Dissection W and/or Wo Contrast  Result Date: 08/23/2020 CLINICAL DATA:  Abdominal pain with aortic dissection suspected EXAM: CT ANGIOGRAPHY CHEST, ABDOMEN AND PELVIS TECHNIQUE: Non-contrast CT of the chest was initially obtained. Multidetector CT imaging through the chest, abdomen and pelvis was performed using the standard protocol during bolus administration of intravenous contrast. Multiplanar reconstructed images  and MIPs were obtained and reviewed to evaluate the vascular anatomy. CONTRAST:  172mL OMNIPAQUE IOHEXOL 350 MG/ML SOLN COMPARISON:  Abdomen and pelvis CT 01/22/2005 FINDINGS: CTA CHEST FINDINGS Cardiovascular: Preferential opacification of the thoracic aorta. No evidence of thoracic aortic aneurysm or dissection. Normal heart size. No pericardial effusion. Aortic and coronary atheromatous calcification. Mediastinum/Nodes: 13 mm nodule in the left thyroid. No followup recommended.(Ref: J Am Coll Radiol. 2015 Feb;12(2): 143-50). Lungs/Pleura: Mild dependent  atelectasis. Assessment is limited by respiratory motion. There is no edema, consolidation, effusion, or pneumothorax. Musculoskeletal: Spondylosis Review of the MIP images confirms the above findings. CTA ABDOMEN AND PELVIS FINDINGS VASCULAR Aorta: Overall mild atheromatous plaque.  No aneurysm or dissection. Celiac: Plaque at the ostium without significant stenosis. No branch occlusion, beading, or aneurysm. SMA: Patent without evidence of aneurysm, dissection, vasculitis or significant stenosis. Renals: Atheromatous plaque at the left renal artery ostium. No stenosis, beading, or are aneurysm. IMA: Patent Inflow: Atheromatous plaque. Veins: Unremarkable in the arterial phase Review of the MIP images confirms the above findings. NON-VASCULAR Hepatobiliary: Chronic liver steatosis. Higher density towards the gallbladder fossa could be hyperemia or sparing.Distended gallbladder with pericholecystic edema and wall thickening. No calcified gallstone detected. No bile duct dilatation. Pancreas: Unremarkable. Spleen: Unremarkable. Adrenals/Urinary Tract: Negative adrenals. No hydronephrosis or stone. Unremarkable bladder. Stomach/Bowel: No obstruction. No visible bowel inflammation. Innumerable colonic diverticula. Lymphatic: No mass or adenopathy. Reproductive:Hysterectomy Other: No ascites or pneumoperitoneum. Musculoskeletal: No acute abnormalities. Sacroiliac osteoarthritis with sclerosis. Lumbar spine degeneration with mild levoscoliosis. Review of the MIP images confirms the above findings. IMPRESSION: 1. Findings of acute cholecystitis but no calcified gallstones. Suggest right upper quadrant ultrasound. 2. No acute aortic syndrome. 3.  Aortic Atherosclerosis (ICD10-I70.0). 4. Extensive colonic diverticulosis. Electronically Signed   By: Monte Fantasia M.D.   On: 08/23/2020 06:25     . sodium chloride 125 mL/hr at 08/23/20 0619  . metronidazole 500 mg (08/23/20 6484)      Assessment/Plan Hypertension Nonischemic cardiomyopathy Chronic diastolic CHF Chronic stable angina Elevated troponin  -Troponin 12>> 79>> 161 Mild-moderate aortic insufficiency  Diabetes BMI 39 Hyperlipidemia  Abdominal pain/chest pain Acute cholecystitis/cholelithiasis  FEN:NPO/IV fluids ID: Maxipime/Flagyl 3/1>> day 1 DVT: SCDs  Plan: Agree with antibiotic therapy.  Patient will need cardiac/medical clearance for surgery. I would continue to keep her n.p.o., IV fluid hydration, and continue IV antibiotics.  Will review with Dr. Brantley Stage and discuss laparoscopic cholecystectomy.   Earnstine Regal [facility] Surgery 08/23/2020, 7:02 AM Please see Amion for pager number during day hours 7:00am-4:30pm

## 2020-08-23 NOTE — ED Provider Notes (Signed)
No dissection, plan admit to medicine for pain and elevated troponins.     Annleigh Knueppel, MD 08/23/20 6002

## 2020-08-23 NOTE — Consult Note (Signed)
[facility] Surgery Consult Note  Julie Graves Jan 29, 1941  [id number].    Requesting MD: Dorie Rank Chief Complaint: upper abdominal pain and chest pain Reason for Consult: acute cholecystitis  HPI:  80 year old female with multiple medical problems presented with abdominal pain and chest pain.  She reports the pain started 2 days she says it was mostly in the midline and epigastric area.  She has been vomiting up a foamy mucus.  She has had a history of appendectomy/hysterectomy.  Currently she has pain primarily on the right side right upper quadrant significantly more so than the right lower quadrant.  Work-up in the ED she is afebrile, blood pressure mildly elevated.  Respiratory rates in the 20s.  Labs shows glucose 122, lipase 24, AST 25, ALT 16, total bilirubin 1.5.  WBC 10.5, hemoglobin 12.9, hematocrit 40.1, platelets 260,000.  Troponin 79, Covid negative CT angio of the chest abdomen pelvis for dissection: Shows chronic liver steatosis higher density towards the gallbladder fossa for being hyperemia or sparing.  Distended gallbladder with pericholecystic edema and wall thickening no calcified gallstones no bile duct dilatation.  No acute aortic syndrome, atherosclerosis, and extensive colonic diverticulosis. Right upper quadrant ultrasound shows multiple small gallstones with shadowing measuring up to 5 mm.  The gallbladder wall is thickened and pericholecystic edema.  Common bile duct is 6 mm.  Findings are consistent with cholelithiasis and acute cholecystitis.  Past medical history includes hypertension, hyperlipidemia, nonischemic cardiomyopathy, chronic diastolic congestive heart failure, diabetes, mild to moderate aortic insufficiency, chronic stable angina, obesity BMI 39.  We are asked to see.  ROS: Review of Systems  Constitutional: Positive for weight loss (22 lbs since last Knee surgery). Negative for chills and fever.  HENT: Negative.   Eyes: Negative.    Respiratory: Positive for shortness of breath (she feels she is anxious).   Cardiovascular: Positive for chest pain (not currently having chest pain, pain is in abdomen) and leg swelling. Negative for palpitations, orthopnea, claudication and PND.  Gastrointestinal: Positive for abdominal pain, heartburn, nausea and vomiting. Negative for blood in stool, constipation, diarrhea and melena.  Genitourinary: Negative.        Says she is incontinent and does not void much at 1 time.  Musculoskeletal:       She is recently had a second knee replacement is undergoing physical therapy to left knee.  Skin: Negative.   Neurological: Negative.   Endo/Heme/Allergies: Negative.   Psychiatric/Behavioral: The patient is nervous/anxious.     Family History  Problem Relation Age of Onset  . Heart attack Father   . Heart attack Sister   . Stroke Mother 55  . Diabetes Sister   . Heart disease Sister   . Diabetes Brother   . Colon cancer Brother   . Breast cancer Neg Hx     Past Medical History:  Diagnosis Date  . Anginal pain ([place])    "small pain?nerves in chest"started 10/05/13   . Anginal pain ([place])    cleared by cardiology 3/15  . Anxiety    rx given recent not taken yet  . Aortic insufficiency    mild on 9/11 cath  . CAD (coronary artery disease)    nonobstructive let heart cath 3/11: 40% ostial D1, 30% mid CFX  . CHF (congestive heart failure) ([place])   . Colon polyps 02/01/2009    MULTIPLE FRAGMENTS OF TUBULAR ADENOMAS  . Diabetes mellitus   . Frozen shoulder   . GERD (gastroesophageal reflux disease)  occ  . History of blood transfusion    pregnancy  . HLD (hyperlipidemia)   . HTN (hypertension)    ACEI cough  . Hx of cardiovascular stress test    Lexiscan Myoview (10/2013):  Fixed ant defect most c/w breast attenuation, cannot exclude apical infarct; no ischemia, EF 46%; Low Risk  . Nonischemic cardiomyopathy ([place])    Ech 3/11 difficult study, moderate aortic insuficiency  noted. Left evntriculogram 3/11  . Obese   . Osteoarthritis   . Palpitation    PACs noted on telemetry while in the hospital  . Pneumonia    hx  . Stress incontinence     Past Surgical History:  Procedure Laterality Date  . ABDOMINAL HYSTERECTOMY    . APPENDECTOMY    . BLADDER NECK SUSPENSION  85  . CARDIAC CATHETERIZATION    . EYE SURGERY    . HAND SURGERY     right "had knots cut out"  . KNEE ARTHROPLASTY Right 12/09/2013   Procedure: COMPUTER ASSISTED Right TOTAL KNEE ARTHROPLASTY;  Surgeon: Marybelle Killings, MD;  Location: [place];  Service: Orthopedics;  Laterality: Right;  . LEFT HEART CATH AND CORONARY ANGIOGRAPHY N/A 08/15/2017   Procedure: LEFT HEART CATH AND CORONARY ANGIOGRAPHY;  Surgeon: Jettie Booze, MD;  Location: [facility];  Service: Cardiovascular;  Laterality: N/A;  . TOTAL KNEE ARTHROPLASTY Left 05/13/2020   Procedure: LEFT TOTAL KNEE ARTHROPLASTY;  Surgeon: Marybelle Killings, MD;  Location: [place];  Service: Orthopedics;  Laterality: Left;  . TUBAL LIGATION      Social History:  reports that she quit smoking about 36 years ago. Her smoking use included cigarettes. She has a 15.00 pack-year smoking history. She has never used smokeless tobacco. She reports that she does not drink alcohol and does not use drugs.  Allergies:  Allergies  Allergen Reactions  . Iodine Hives and Swelling  . Shellfish Allergy Swelling and Other (See Comments)    Swelling - throat and lips  . Topiramate     Other reaction(s): Chest Pain  . Metamucil [Psyllium]   . Lisinopril Other (See Comments)    cough  . Penicillins Hives, Rash and Other (See Comments)    Has patient had a PCN reaction causing immediate rash, facial/tongue/throat swelling, SOB or lightheadedness with hypotension: Unknown Has patient had a PCN reaction causing severe rash involving mucus membranes or skin necrosis: No Has patient had a PCN reaction that required hospitalization: No Has patient had a PCN  reaction occurring within the last 10 years: No If all of the above answers are "NO", then may proceed with Cephalosporin use.   . Tramadol Other (See Comments)    confusion    Prior to Admission medications   Medication Sig Start Date End Date Taking? Authorizing Provider  acetaminophen (TYLENOL) 500 MG tablet Take 1,000 mg by mouth every 6 (six) hours as needed for moderate pain or headache.     [provider]  alendronate (FOSAMAX) 70 MG tablet Take 70 mg by mouth every Monday. Take with a full glass of water on an empty stomach. Take on mondays    [provider]  aspirin EC 81 MG tablet Take 81 mg by mouth daily. Swallow whole.    [provider]  Calcium Carb-Cholecalciferol (CALCIUM 600/VITAMIN D3 PO) Take 1 tablet by mouth daily.     [provider]  carvedilol (COREG) 6.25 MG tablet Take 6.25 mg by mouth 2 (two) times daily.  [provider]  Cholecalciferol (VITAMIN D) 2000 units CAPS Take 2,000 Units by mouth daily.    [provider]  diazepam (VALIUM) 2 MG tablet Take 2 mg by mouth daily as needed for anxiety.  11/25/19   [provider]  EPINEPHrine 0.3 mg/0.3 mL IJ SOAJ injection Inject 0.3 mLs (0.3 mg total) into the muscle as needed for anaphylaxis. 10/14/18   Horton, Barbette Hair, MD  fluticasone (FLONASE) 50 MCG/ACT nasal spray Place 1 spray into both nostrils daily as needed for allergies or rhinitis.    [provider]  furosemide (LASIX) 40 MG tablet Take 40 mg by mouth daily as needed for fluid or edema.  Patient not taking: No sig reported    [provider]  HYDROcodone-acetaminophen (NORCO/VICODIN) 5-325 MG tablet Take 1 tablet by mouth every 6 (six) hours as needed for moderate pain. 07/29/20   Marybelle Killings, MD  JARDIANCE 10 MG TABS tablet TAKE 1 TABLET BY MOUTH DAILY BEFORE BREAKFAST 05/02/20   Adrian Prows, MD  losartan (COZAAR) 100 MG tablet Take 1 tablet (100 mg total) by mouth daily.  09/09/19   Adrian Prows, MD  metFORMIN (GLUCOPHAGE-XR) 500 MG 24 hr tablet Take 500 mg by mouth daily. 07/08/19   [provider]  nitroGLYCERIN (NITROSTAT) 0.4 MG SL tablet Place 0.4 mg under the tongue every 5 (five) minutes as needed for chest pain. 02/25/20   [provider]  oxyCODONE (OXY IR/ROXICODONE) 5 MG immediate release tablet Take 5 mg by mouth 2 (two) times daily as needed. Patient not taking: Reported on 07/29/2020 06/17/20   [provider]  oxyCODONE-acetaminophen (PERCOCET/ROXICET) 5-325 MG tablet Take 1 tablet by mouth every 4 (four) hours as needed for severe pain. Patient not taking: Reported on 07/29/2020 05/13/20   Lanae Crumbly, PA-C  potassium chloride SA (K-DUR) 20 MEQ tablet Take 20 mEq by mouth daily as needed (when taking furosemide).  01/27/19   [provider]  simvastatin (ZOCOR) 40 MG tablet Take 40 mg by mouth at bedtime.    [provider]  spironolactone (ALDACTONE) 25 MG tablet TAKE 1 TABLET(25 MG) BY MOUTH DAILY Patient taking differently: Take 25 mg by mouth daily. 03/01/20   Adrian Prows, MD     Blood pressure (!) 142/73, pulse 87, temperature (!) 97.5 F (36.4 C), temperature source Oral, resp. rate 19, height 5' (1.524 m), weight 90.7 kg, SpO2 97 %. Physical Exam:  General: pleasant, obese AA female just transferred to the floor.,  Anxious, with ongoing abdominal pain. HEENT: head is normocephalic, atraumatic.  Sclera are noninjected.  Pupils are equal.  Ears and nose without any masses or lesions.  Mouth is pink and moist Heart: regular, slightly tachycardic.  Normal s1,s2. No obvious murmurs, gallops, or rubs noted.  Palpable radial and pedal pulses bilaterally Lungs: CTAB, no wheezes, rhonchi, or rales noted.  Respiratory effort nonlabored Abd: soft, she is sore all over her abdomen both the right and left sides but she is tender right lower quadrant and severely tender right upper quadrant.  ND, +BS, no masses, hernias,  or organomegaly MS: all 4 extremities are symmetrical with no cyanosis, clubbing.  +1 edema both lower extremities which appears to be chronic Skin: warm and dry with no masses, lesions, or rashes Neuro: Cranial nerves 2-12 grossly intact, sensation is normal throughout Psych: A&Ox3 with an appropriate affect.   Results for orders placed or performed during the hospital encounter of 08/22/20 (from the past 48 hour(s))  Basic metabolic panel     Status: Abnormal   Collection Time: 08/22/20  7:29 PM  Result Value Ref Range   Sodium 136 135 - 145 mmol/L   Potassium 4.1 3.5 - 5.1 mmol/L   Chloride 102 98 - 111 mmol/L   CO2 25 22 - 32 mmol/L   Glucose, Bld 122 (H) 70 - 99 mg/dL    Comment: Glucose reference range applies only to samples taken after fasting for at least 8 hours.   BUN 12 8 - 23 mg/dL   Creatinine, Ser 0.59 0.44 - 1.00 mg/dL   Calcium 9.6 8.9 - 10.3 mg/dL   GFR, Estimated >60 >60 mL/min    Comment: (NOTE) Calculated using the CKD-EPI Creatinine Equation (2021)    Anion gap 9 5 - 15    Comment: Performed at [facility], [place] [address], [place], [place] [address]  CBC     Status: Abnormal   Collection Time: 08/22/20  7:29 PM  Result Value Ref Range   WBC 10.9 (H) 4.0 - 10.5 K/uL   RBC 4.21 3.87 - 5.11 MIL/uL   Hemoglobin 12.9 12.0 - 15.0 g/dL   HCT 40.1 36.0 - 46.0 %   MCV 95.2 80.0 - 100.0 fL   MCH 30.6 26.0 - 34.0 pg   MCHC 32.2 30.0 - 36.0 g/dL   RDW 13.1 11.5 - 15.5 %   Platelets 260 150 - 400 K/uL   nRBC 0.0 0.0 - 0.2 %    Comment: Performed at [facility], [place] [address], [place], [place] [address]  Troponin I (High Sensitivity)     Status: None   Collection Time: 08/22/20  7:29 PM  Result Value Ref Range   Troponin I (High Sensitivity) 12 <18 ng/L    Comment: (NOTE) Elevated high sensitivity troponin I (hsTnI) values and significant  changes across serial measurements may suggest ACS but many other  chronic and acute  conditions are known to elevate hsTnI results.  Refer to the "Links" section for chest pain algorithms and additional  guidance. Performed at [facility], [place] [address], [place], [place] [address]   Troponin I (High Sensitivity)     Status: Abnormal   Collection Time: 08/22/20 10:00 PM  Result Value Ref Range   Troponin I (High Sensitivity) 79 (H) <18 ng/L    Comment: RESULT CALLED TO, READ BACK BY AND VERIFIED WITH: JESSICA LOWDERMILK @ [id number] ON 08/22/20 C VARNER DELTA CHECK NOTED (NOTE) Elevated high sensitivity troponin I (hsTnI) values and significant  changes across serial measurements may suggest ACS but many other  chronic and acute conditions are known to elevate hsTnI results.  Refer to the Links section for chest pain algorithms and additional  guidance. Performed at [facility], [place] [address], [place], [place] [address]   Lipase, blood     Status: None   Collection Time: 08/22/20 10:00 PM  Result Value Ref Range   Lipase 24 11 - 51 U/L    Comment: Performed at [facility], [place] [address] [place], [place] [address]  Hepatic function panel     Status: Abnormal   Collection Time: 08/22/20 10:00 PM  Result Value Ref Range   Total Protein 7.9 6.5 - 8.1 g/dL   Albumin 4.4 3.5 - 5.0 g/dL   AST 25 15 - 41 U/L   ALT 16 0 - 44 U/L   Alkaline Phosphatase 38 38 - 126 U/L   Total  Bilirubin 1.5 (H) 0.3 - 1.2 mg/dL   Bilirubin, Direct 0.4 (H) 0.0 - 0.2 mg/dL   Indirect Bilirubin 1.1 (H) 0.3 - 0.9 mg/dL    Comment: Performed at [facility], [place] [address], [place], [place] [address]  Resp Panel by RT-PCR (Flu A&B, Covid) Nasopharyngeal Swab     Status: None   Collection Time: 08/22/20 11:27 PM   Specimen: Nasopharyngeal Swab; Nasopharyngeal(NP) swabs in vial transport medium  Result Value Ref Range   SARS Coronavirus 2 by RT PCR NEGATIVE NEGATIVE    Comment: (NOTE) SARS-CoV-2 target nucleic acids  are NOT DETECTED.  The SARS-CoV-2 RNA is generally detectable in upper respiratory specimens during the acute phase of infection. The lowest concentration of SARS-CoV-2 viral copies this assay can detect is 138 copies/mL. A negative result does not preclude SARS-Cov-2 infection and should not be used as the sole basis for treatment or other patient management decisions. A negative result may occur with  improper specimen collection/handling, submission of specimen other than nasopharyngeal swab, presence of viral mutation(s) within the areas targeted by this assay, and inadequate number of viral copies(<138 copies/mL). A negative result must be combined with clinical observations, patient history, and epidemiological information. The expected result is Negative.  Fact Sheet for Patients:  EntrepreneurPulse.com.au  Fact Sheet for Healthcare Providers:  IncredibleEmployment.be  This test is no t yet approved or cleared by the [place] FDA and  has been authorized for detection and/or diagnosis of SARS-CoV-2 by FDA under an Emergency Use Authorization (EUA). This EUA will remain  in effect (meaning this test can be used) for the duration of the COVID-19 declaration under Section 564(b)(1) of the Act, 21 U.S.C.section 360bbb-3(b)(1), unless the authorization is terminated  or revoked sooner.       Influenza A by PCR NEGATIVE NEGATIVE   Influenza B by PCR NEGATIVE NEGATIVE    Comment: (NOTE) The Xpert Xpress SARS-CoV-2/FLU/RSV plus assay is intended as an aid in the diagnosis of influenza from Nasopharyngeal swab specimens and should not be used as a sole basis for treatment. Nasal washings and aspirates are unacceptable for Xpert Xpress SARS-CoV-2/FLU/RSV testing.  Fact Sheet for Patients: EntrepreneurPulse.com.au  Fact Sheet for Healthcare Providers: IncredibleEmployment.be  This test is not yet  approved or cleared by the [place] FDA and has been authorized for detection and/or diagnosis of SARS-CoV-2 by FDA under an Emergency Use Authorization (EUA). This EUA will remain in effect (meaning this test can be used) for the duration of the COVID-19 declaration under Section 564(b)(1) of the Act, 21 U.S.C. section 360bbb-3(b)(1), unless the authorization is terminated or revoked.  Performed at [facility], [place] [address] [place], [place] [address]    DG Chest 2 View  Result Date: 08/22/2020 CLINICAL DATA:  Shortness of breath. EXAM: CHEST - 2 VIEW COMPARISON:  08/13/2017 FINDINGS: The heart size is stable but enlarged. Aortic calcifications are noted. There is no pneumothorax or large pleural effusion. No focal infiltrate. No acute osseous abnormality. IMPRESSION: No active cardiopulmonary disease. Electronically Signed   By: Constance Holster M.D.   On: 08/22/2020 19:49   CT Angio Chest/Abd/Pel for Dissection W and/or Wo Contrast  Result Date: 08/23/2020 CLINICAL DATA:  Abdominal pain with aortic dissection suspected EXAM: CT ANGIOGRAPHY CHEST, ABDOMEN AND PELVIS TECHNIQUE: Non-contrast CT of the chest was initially obtained. Multidetector CT imaging through the chest, abdomen and pelvis was performed using the standard protocol during bolus administration of intravenous contrast. Multiplanar reconstructed images  and MIPs were obtained and reviewed to evaluate the vascular anatomy. CONTRAST:  158mL OMNIPAQUE IOHEXOL 350 MG/ML SOLN COMPARISON:  Abdomen and pelvis CT 01/22/2005 FINDINGS: CTA CHEST FINDINGS Cardiovascular: Preferential opacification of the thoracic aorta. No evidence of thoracic aortic aneurysm or dissection. Normal heart size. No pericardial effusion. Aortic and coronary atheromatous calcification. Mediastinum/Nodes: 13 mm nodule in the left thyroid. No followup recommended.(Ref: J Am Coll Radiol. 2015 Feb;12(2): 143-50). Lungs/Pleura: Mild dependent  atelectasis. Assessment is limited by respiratory motion. There is no edema, consolidation, effusion, or pneumothorax. Musculoskeletal: Spondylosis Review of the MIP images confirms the above findings. CTA ABDOMEN AND PELVIS FINDINGS VASCULAR Aorta: Overall mild atheromatous plaque.  No aneurysm or dissection. Celiac: Plaque at the ostium without significant stenosis. No branch occlusion, beading, or aneurysm. SMA: Patent without evidence of aneurysm, dissection, vasculitis or significant stenosis. Renals: Atheromatous plaque at the left renal artery ostium. No stenosis, beading, or are aneurysm. IMA: Patent Inflow: Atheromatous plaque. Veins: Unremarkable in the arterial phase Review of the MIP images confirms the above findings. NON-VASCULAR Hepatobiliary: Chronic liver steatosis. Higher density towards the gallbladder fossa could be hyperemia or sparing.Distended gallbladder with pericholecystic edema and wall thickening. No calcified gallstone detected. No bile duct dilatation. Pancreas: Unremarkable. Spleen: Unremarkable. Adrenals/Urinary Tract: Negative adrenals. No hydronephrosis or stone. Unremarkable bladder. Stomach/Bowel: No obstruction. No visible bowel inflammation. Innumerable colonic diverticula. Lymphatic: No mass or adenopathy. Reproductive:Hysterectomy Other: No ascites or pneumoperitoneum. Musculoskeletal: No acute abnormalities. Sacroiliac osteoarthritis with sclerosis. Lumbar spine degeneration with mild levoscoliosis. Review of the MIP images confirms the above findings. IMPRESSION: 1. Findings of acute cholecystitis but no calcified gallstones. Suggest right upper quadrant ultrasound. 2. No acute aortic syndrome. 3.  Aortic Atherosclerosis (ICD10-I70.0). 4. Extensive colonic diverticulosis. Electronically Signed   By: Monte Fantasia M.D.   On: 08/23/2020 06:25     . sodium chloride 125 mL/hr at 08/23/20 0619  . metronidazole 500 mg (08/23/20 5997)      Assessment/Plan Hypertension Nonischemic cardiomyopathy Chronic diastolic CHF Chronic stable angina Elevated troponin  -Troponin 12>> 79>> 161 Mild-moderate aortic insufficiency  Diabetes BMI 39 Hyperlipidemia  Abdominal pain/chest pain Acute cholecystitis/cholelithiasis  FEN:NPO/IV fluids ID: Maxipime/Flagyl 3/1>> day 1 DVT: SCDs  Plan: Agree with antibiotic therapy.  Patient will need cardiac/medical clearance for surgery. I would continue to keep her n.p.o., IV fluid hydration, and continue IV antibiotics.  Will review with Dr. Brantley Stage and discuss laparoscopic cholecystectomy.   Earnstine Regal [facility] 08/23/2020, 7:02 AM Please see Amion for pager number during day hours 7:00am-4:30pm

## 2020-08-23 NOTE — Consult Note (Signed)
CARDIOLOGY CONSULT NOTE  Patient ID: Julie Graves MRN: [id number] DOB/AGE: 11/04/1940 80 y.o.  Admit date: 08/22/2020 Referring Physician  Marva Panda, MD Primary Physician:  Harrison Mons, [place] Reason for Consultation  Pre operative CV risk evaluation for Lap-cholecystectomy  Patient ID: Julie Graves, female    DOB: 12/31/1940, 80 y.o.   MRN: [id number]  Chief Complaint  Patient presents with  . Abdominal discomfort and left sided chest pain   HPI:    Julie Graves  is a 80 y.o. AAF patient with morbid obesity, , nonischemic cardiomyopathy with EF 40% by echo in June 2019. Coronary angiogram on 08/21/17 and revealed mid circumflex 50% and small with 2 distal diagonal 80% stenosis. She has mild Chronic stable angina pectoris, mild to moderate aortic insufficiency, pre diabetes (Patient reluctant to starting metformin due to side-effects), hypertension, hyperlipidemia, former tobacco use with 15-pack-year history.  She underwent left total knee arthroplasty on 05/13/2020 without any periprocedural cardiac complications.    She presented to the emergency room yesterday with abdominal discomfort and chest pain, was found to have acute cholecystitis and also mildly abnormal serum troponin.  I was consulted for preoperative cardiac recertification.  She is presently in acute distress with severe abdominal pain.  States that nausea has improved but she was writhing in pain when I saw her today.  On further questioning, stated that her whole upper abdomen was hurting really bad for the last 3 days including chest and her entire body.  Past Medical History:  Diagnosis Date  . Anginal pain ([place])    "small pain?nerves in chest"started 10/05/13   . Anginal pain ([place])    cleared by cardiology 3/15  . Anxiety    rx given recent not taken yet  . Aortic insufficiency    mild on 9/11 cath  . CAD (coronary artery disease)    nonobstructive let heart cath 3/11: 40% ostial D1, 30% mid  CFX  . CHF (congestive heart failure) ([place])   . Colon polyps 02/01/2009    MULTIPLE FRAGMENTS OF TUBULAR ADENOMAS  . Diabetes mellitus   . Frozen shoulder   . GERD (gastroesophageal reflux disease)    occ  . History of blood transfusion    pregnancy  . HLD (hyperlipidemia)   . HTN (hypertension)    ACEI cough  . Hx of cardiovascular stress test    Lexiscan Myoview (10/2013):  Fixed ant defect most c/w breast attenuation, cannot exclude apical infarct; no ischemia, EF 46%; Low Risk  . Nonischemic cardiomyopathy ([place])    Ech 3/11 difficult study, moderate aortic insuficiency noted. Left evntriculogram 3/11  . Obese   . Osteoarthritis   . Palpitation    PACs noted on telemetry while in the hospital  . Pneumonia    hx  . Stress incontinence    Past Surgical History:  Procedure Laterality Date  . ABDOMINAL HYSTERECTOMY    . APPENDECTOMY    . BLADDER NECK SUSPENSION  85  . CARDIAC CATHETERIZATION    . EYE SURGERY    . HAND SURGERY     right "had knots cut out"  . KNEE ARTHROPLASTY Right 12/09/2013   Procedure: COMPUTER ASSISTED Right TOTAL KNEE ARTHROPLASTY;  Surgeon: Marybelle Killings, MD;  Location: [facility];  Service: Orthopedics;  Laterality: Right;  . LEFT HEART CATH AND CORONARY ANGIOGRAPHY N/A 08/15/2017   Procedure: LEFT HEART CATH AND CORONARY ANGIOGRAPHY;  Surgeon: Jettie Booze, MD;  Location: [facility];  Service: Cardiovascular;  Laterality: N/A;  . TOTAL KNEE ARTHROPLASTY Left 05/13/2020   Procedure: LEFT TOTAL KNEE ARTHROPLASTY;  Surgeon: Marybelle Killings, MD;  Location: [facility];  Service: Orthopedics;  Laterality: Left;  . TUBAL LIGATION     Social History   Tobacco Use  . Smoking status: Former Smoker    Packs/day: 1.00    Years: 15.00    Pack years: 15.00    Types: Cigarettes    Quit date: 10/10/1983    Years since quitting: 36.8  . Smokeless tobacco: Never Used  . Tobacco comment: Quit 1970s  Substance Use Topics  . Alcohol use: No    Alcohol/week:  0.0 standard drinks    Family History  Problem Relation Age of Onset  . Heart attack Father   . Heart attack Sister   . Stroke Mother 32  . Diabetes Sister   . Heart disease Sister   . Diabetes Brother   . Colon cancer Brother   . Breast cancer Neg Hx     Marital Sttus: Married  ROS  Review of Systems  Constitutional: Positive for malaise/fatigue.  Cardiovascular: Positive for chest pain and dyspnea on exertion. Negative for leg swelling.  Musculoskeletal: Positive for arthritis, joint pain and joint swelling (knee).  Gastrointestinal: Positive for abdominal pain and nausea. Negative for melena.   Objective   Vitals with BMI 08/23/2020 08/23/2020 08/23/2020  Height - - -  Weight - - -  BMI - - -  Systolic [phone]  Diastolic 59 53 56  Pulse 85 84 88    Blood pressure (!) 120/59, pulse 85, temperature 99.4 F (37.4 C), temperature source Oral, resp. rate 19, height 5' (1.524 m), weight 90.7 kg, SpO2 97 %.    Physical Exam Constitutional:      General: She is in acute distress.     Appearance: Normal appearance. She is morbidly obese. She is ill-appearing.     Comments: Morbidly obese in no acute distress.  Eyes:     Extraocular Movements: Extraocular movements intact.  Cardiovascular:     Rate and Rhythm: Normal rate and regular rhythm.     Pulses:          Carotid pulses are 2+ on the right side and 2+ on the left side.      Dorsalis pedis pulses are 2+ on the right side and 2+ on the left side.       Posterior tibial pulses are 2+ on the right side and 2+ on the left side.     Heart sounds: Normal heart sounds. No murmur heard. No gallop.      Comments: Femoral and popliteal pulse difficult to feel due to patient's body habitus.  2+ bilateral below knee leg edema. . JVD difficult to see due to short neck. Pulmonary:     Effort: Pulmonary effort is normal.     Breath sounds: Normal breath sounds.  Abdominal:     General: Bowel sounds are normal.     Palpations:  Abdomen is soft.     Comments: Obese. Pannus present  Musculoskeletal:        General: Swelling (left knee) present.     Cervical back: Normal range of motion.  Skin:    General: Skin is warm and dry.  Neurological:     General: No focal deficit present.     Mental Status: She is alert and oriented to person, place, and time.    Laboratory examination:   Recent Labs  09/29/19 1305 05/09/20 1126 05/14/20 0205 08/22/20 1929 08/23/20 1548  NA 140   < > 138 136 139  K 4.5   < > 3.8 4.1 3.7  CL 104   < > 109 102 106  CO2 23   < > 24 25 19*  GLUCOSE 97   < > 149* 122* 89  BUN 17   < > 15 12 10   CREATININE 0.72   < > 0.71 0.59 0.56  CALCIUM 9.7   < > 8.5* 9.6 8.9  GFRNONAA 80   < > >60 >60 >60  GFRAA 93  --   --   --   --    < > = values in this interval not displayed.   estimated creatinine clearance is 57.3 mL/min (by C-G formula based on SCr of 0.56 mg/dL).  CMP Latest Ref Rng & Units 08/23/2020 08/22/2020 05/14/2020  Glucose 70 - 99 mg/dL 89 122(H) 149(H)  BUN 8 - 23 mg/dL 10 12 15   Creatinine 0.44 - 1.00 mg/dL 0.56 0.59 0.71  Sodium 135 - 145 mmol/L 139 136 138  Potassium 3.5 - 5.1 mmol/L 3.7 4.1 3.8  Chloride 98 - 111 mmol/L 106 102 109  CO2 22 - 32 mmol/L 19(L) 25 24  Calcium 8.9 - 10.3 mg/dL 8.9 9.6 8.5(L)  Total Protein 6.5 - 8.1 g/dL - 7.9 -  Total Bilirubin 0.3 - 1.2 mg/dL - 1.5(H) -  Alkaline Phos 38 - 126 U/L - 38 -  AST 15 - 41 U/L - 25 -  ALT 0 - 44 U/L - 16 -   CBC Latest Ref Rng & Units 08/22/2020 05/16/2020 05/15/2020  WBC 4.0 - 10.5 K/uL 10.9(H) 10.3 12.3(H)  Hemoglobin 12.0 - 15.0 g/dL 12.9 11.0(L) 11.4(L)  Hematocrit 36.0 - 46.0 % 40.1 34.3(L) 35.3(L)  Platelets 150 - 400 K/uL 260 218 223   Lipid Panel No results for input(s): CHOL, TRIG, LDLCALC, VLDL, HDL, CHOLHDL, LDLDIRECT in the last 8760 hours.  HEMOGLOBIN A1C Lab Results  Component Value Date   HGBA1C 5.9 (H) 05/13/2020   MPG 122.63 05/13/2020   TSH No results for input(s): TSH in the  last 8760 hours.  Component Ref Range & Units 15:18 11:51 10:09 07:41 1 d ago 1 d ago 1 yr ago  Troponin I (High Sensitivity) <18 ng/L 181High Panic  192High Panic CM  180High Panic CM  161High Panic CM  79High CM  12       Recent Labs    09/29/19 1305  BNP 113.9*   Medications and allergies   Allergies  Allergen Reactions  . Iodine Hives and Swelling  . Shellfish Allergy Swelling and Other (See Comments)    Swelling - throat and lips  . Topiramate     Other reaction(s): Chest Pain  . Metamucil [Psyllium]   . Lisinopril Other (See Comments)    cough  . Penicillins Hives, Rash and Other (See Comments)    Has patient had a PCN reaction causing immediate rash, facial/tongue/throat swelling, SOB or lightheadedness with hypotension: Unknown Has patient had a PCN reaction causing severe rash involving mucus membranes or skin necrosis: No Has patient had a PCN reaction that required hospitalization: No Has patient had a PCN reaction occurring within the last 10 years: No If all of the above answers are "NO", then may proceed with Cephalosporin use.   . Tramadol Other (See Comments)    confusion    No current facility-administered medications on file prior to encounter.  Current Outpatient Medications on File Prior to Encounter  Medication Sig Dispense Refill  . alendronate (FOSAMAX) 70 MG tablet Take 70 mg by mouth every Monday. Take with a full glass of water on an empty stomach. Take on mondays    . Ascorbic Acid (VITAMIN C PO) Take 1 tablet by mouth daily.    Marland Kitchen aspirin EC 81 MG tablet Take 81 mg by mouth daily. Swallow whole.    . Calcium Carb-Cholecalciferol (CALCIUM 600/VITAMIN D3 PO) Take 1 tablet by mouth daily.     . carvedilol (COREG) 6.25 MG tablet Take 6.25 mg by mouth 2 (two) times daily.    . Cholecalciferol (VITAMIN D) 2000 units CAPS Take 2,000 Units by mouth daily.    . diazepam (VALIUM) 2 MG tablet Take 1 mg by mouth daily as needed for anxiety.    Marland Kitchen  EPINEPHrine 0.3 mg/0.3 mL IJ SOAJ injection Inject 0.3 mLs (0.3 mg total) into the muscle as needed for anaphylaxis. 1 Device 0  . HYDROcodone-acetaminophen (NORCO/VICODIN) 5-325 MG tablet Take 1 tablet by mouth every 6 (six) hours as needed for moderate pain. 30 tablet 0  . JARDIANCE 10 MG TABS tablet TAKE 1 TABLET BY MOUTH DAILY BEFORE BREAKFAST (Patient taking differently: Take 10 mg by mouth daily.) 30 tablet 6  . losartan (COZAAR) 100 MG tablet Take 1 tablet (100 mg total) by mouth daily. 90 tablet 3  . metFORMIN (GLUCOPHAGE-XR) 500 MG 24 hr tablet Take 500 mg by mouth daily.    . nitroGLYCERIN (NITROSTAT) 0.4 MG SL tablet Place 0.4 mg under the tongue every 5 (five) minutes as needed for chest pain.    . simvastatin (ZOCOR) 40 MG tablet Take 40 mg by mouth at bedtime.    Marland Kitchen spironolactone (ALDACTONE) 25 MG tablet TAKE 1 TABLET(25 MG) BY MOUTH DAILY (Patient taking differently: Take 25 mg by mouth daily.) 90 tablet 2  . oxyCODONE-acetaminophen (PERCOCET/ROXICET) 5-325 MG tablet Take 1 tablet by mouth every 4 (four) hours as needed for severe pain. (Patient not taking: No sig reported) 50 tablet 0     Scheduled Meds: . metoprolol tartrate  2.5 mg Intravenous Q6H  . sodium chloride flush  3 mL Intravenous Q12H   Continuous Infusions: . sodium chloride 1,000 mL (08/23/20 1603)  . ceFEPime (MAXIPIME) IV 2 g (08/23/20 1234)  . metronidazole 500 mg (08/23/20 1441)   PRN Meds:.HYDROmorphone (DILAUDID) injection, ondansetron **OR** ondansetron (ZOFRAN) IV, polyethylene glycol   I/O last 3 completed shifts: In: 2135.9 [I.V.:1785.9; IV Piggyback:350] Out: -  No intake/output data recorded.   Radiology:   DG Chest 2 View  Result Date: 08/22/2020 CLINICAL DATA:  Shortness of breath. EXAM: CHEST - 2 VIEW COMPARISON:  08/13/2017 FINDINGS: The heart size is stable but enlarged. Aortic calcifications are noted. There is no pneumothorax or large pleural effusion. No focal infiltrate. No acute  osseous abnormality. IMPRESSION: No active cardiopulmonary disease. Electronically Signed   By: Constance Holster M.D.   On: 08/22/2020 19:49   ECHOCARDIOGRAM COMPLETE  Result Date: 08/23/2020    ECHOCARDIOGRAM REPORT   Patient Name:   Julie Graves Date of Exam: 08/23/2020 Medical Rec #:  [id number]          Height:       60.0 in Accession #:    [id number]         Weight:       200.0 lb Date of Birth:  [birth year]          BSA:  1.866 m Patient Age:    57 years           BP:           101/56 mmHg Patient Gender: F                  HR:           84 bpm. Exam Location:  Inpatient Procedure: 2D Echo and Intracardiac Opacification Agent Indications:    Elevated Troponin  History:        Patient has prior history of Echocardiogram examinations, most                 recent 09/26/2019. CHF, CAD; Risk Factors:Hypertension, Diabetes                 and Dyslipidemia.  Sonographer:    Mikki Santee RDCS (AE) Referring Phys: [id number] [place] E SEGAL IMPRESSIONS  1. Left ventricular ejection fraction, by estimation, is 50 to 55%. The left ventricle has low normal function. The left ventricle has no regional wall motion abnormalities. There is mild left ventricular hypertrophy. Left ventricular diastolic parameters were normal.  2. Right ventricular systolic function is normal. The right ventricular size is normal.  3. Left atrial size was mildly dilated.  4. The mitral valve is normal in structure. Trivial mitral valve regurgitation. No evidence of mitral stenosis.  5. The aortic valve was not well visualized. Aortic valve regurgitation is mild. Mild aortic valve stenosis.  6. The inferior vena cava is normal in size with greater than 50% respiratory variability, suggesting right atrial pressure of 3 mmHg. FINDINGS  Left Ventricle: Left ventricular ejection fraction, by estimation, is 50 to 55%. The left ventricle has low normal function. The left ventricle has no regional wall motion abnormalities. Definity  contrast agent was given IV to delineate the left ventricular endocardial borders. The left ventricular internal cavity size was normal in size. There is mild left ventricular hypertrophy. Left ventricular diastolic parameters were normal. Right Ventricle: The right ventricular size is normal. No increase in right ventricular wall thickness. Right ventricular systolic function is normal. Left Atrium: Left atrial size was mildly dilated. Right Atrium: Right atrial size was normal in size. Pericardium: There is no evidence of pericardial effusion. Mitral Valve: The mitral valve is normal in structure. Trivial mitral valve regurgitation. No evidence of mitral valve stenosis. Tricuspid Valve: The tricuspid valve is normal in structure. Tricuspid valve regurgitation is not demonstrated. No evidence of tricuspid stenosis. Aortic Valve: The aortic valve was not well visualized. Aortic valve regurgitation is mild. Aortic regurgitation PHT measures 283 msec. Mild aortic stenosis is present. Aortic valve mean gradient measures 12.5 mmHg. Aortic valve peak gradient measures 21.0 mmHg. Aortic valve area, by VTI measures 1.73 cm. Pulmonic Valve: The pulmonic valve was normal in structure. Pulmonic valve regurgitation is not visualized. No evidence of pulmonic stenosis. Aorta: The aortic root is normal in size and structure. Venous: The inferior vena cava is normal in size with greater than 50% respiratory variability, suggesting right atrial pressure of 3 mmHg. IAS/Shunts: No atrial level shunt detected by color flow Doppler.  LEFT VENTRICLE PLAX 2D LVIDd:         4.40 cm  Diastology LVIDs:         3.60 cm  LV e' medial:    5.55 cm/s LV PW:         1.10 cm  LV E/e' medial:  13.8 LV IVS:  1.30 cm  LV e' lateral:   7.18 cm/s LVOT diam:     2.10 cm  LV E/e' lateral: 10.7 LV SV:         78 LV SV Index:   42 LVOT Area:     3.46 cm  RIGHT VENTRICLE RV S prime:     8.05 cm/s TAPSE (M-mode): 2.2 cm LEFT ATRIUM             Index        RIGHT ATRIUM           Index LA diam:        4.00 cm 2.14 cm/m  RA Area:     12.30 cm LA Vol (A2C):   65.6 ml 35.15 ml/m RA Volume:   28.50 ml  15.27 ml/m LA Vol (A4C):   48.1 ml 25.77 ml/m LA Biplane Vol: 57.2 ml 30.65 ml/m  AORTIC VALVE AV Area (Vmax):    1.74 cm AV Area (Vmean):   1.68 cm AV Area (VTI):     1.73 cm AV Vmax:           229.00 cm/s AV Vmean:          167.000 cm/s AV VTI:            0.452 m AV Peak Grad:      21.0 mmHg AV Mean Grad:      12.5 mmHg LVOT Vmax:         115.00 cm/s LVOT Vmean:        80.900 cm/s LVOT VTI:          0.226 m LVOT/AV VTI ratio: 0.50 AI PHT:            283 msec  AORTA Ao Root diam: 3.00 cm MITRAL VALVE MV Area (PHT): 2.29 cm     SHUNTS MV Decel Time: 331 msec     Systemic VTI:  0.23 m MV E velocity: 76.50 cm/s   Systemic Diam: 2.10 cm MV A velocity: 102.00 cm/s MV E/A ratio:  0.75 Jenkins Rouge MD Electronically signed by Jenkins Rouge MD Signature Date/Time: 08/23/2020/3:05:22 PM    Final    CT Angio Chest/Abd/Pel for Dissection W and/or Wo Contrast  Result Date: 08/23/2020 CLINICAL DATA:  Abdominal pain with aortic dissection suspected EXAM: CT ANGIOGRAPHY CHEST, ABDOMEN AND PELVIS TECHNIQUE: Non-contrast CT of the chest was initially obtained. Multidetector CT imaging through the chest, abdomen and pelvis was performed using the standard protocol during bolus administration of intravenous contrast. Multiplanar reconstructed images and MIPs were obtained and reviewed to evaluate the vascular anatomy. CONTRAST:  138mL OMNIPAQUE IOHEXOL 350 MG/ML SOLN COMPARISON:  Abdomen and pelvis CT 01/22/2005 FINDINGS: CTA CHEST FINDINGS Cardiovascular: Preferential opacification of the thoracic aorta. No evidence of thoracic aortic aneurysm or dissection. Normal heart size. No pericardial effusion. Aortic and coronary atheromatous calcification. Mediastinum/Nodes: 13 mm nodule in the left thyroid. No followup recommended.(Ref: J Am Coll Radiol. 2015 Feb;12(2): 143-50).  Lungs/Pleura: Mild dependent atelectasis. Assessment is limited by respiratory motion. There is no edema, consolidation, effusion, or pneumothorax. Musculoskeletal: Spondylosis Review of the MIP images confirms the above findings. CTA ABDOMEN AND PELVIS FINDINGS VASCULAR Aorta: Overall mild atheromatous plaque.  No aneurysm or dissection. Celiac: Plaque at the ostium without significant stenosis. No branch occlusion, beading, or aneurysm. SMA: Patent without evidence of aneurysm, dissection, vasculitis or significant stenosis. Renals: Atheromatous plaque at the left renal artery ostium. No stenosis, beading, or are aneurysm. IMA: Patent Inflow: Atheromatous plaque.  Veins: Unremarkable in the arterial phase Review of the MIP images confirms the above findings. NON-VASCULAR Hepatobiliary: Chronic liver steatosis. Higher density towards the gallbladder fossa could be hyperemia or sparing.Distended gallbladder with pericholecystic edema and wall thickening. No calcified gallstone detected. No bile duct dilatation. Pancreas: Unremarkable. Spleen: Unremarkable. Adrenals/Urinary Tract: Negative adrenals. No hydronephrosis or stone. Unremarkable bladder. Stomach/Bowel: No obstruction. No visible bowel inflammation. Innumerable colonic diverticula. Lymphatic: No mass or adenopathy. Reproductive:Hysterectomy Other: No ascites or pneumoperitoneum. Musculoskeletal: No acute abnormalities. Sacroiliac osteoarthritis with sclerosis. Lumbar spine degeneration with mild levoscoliosis. Review of the MIP images confirms the above findings. IMPRESSION: 1. Findings of acute cholecystitis but no calcified gallstones. Suggest right upper quadrant ultrasound. 2. No acute aortic syndrome. 3.  Aortic Atherosclerosis (ICD10-I70.0). 4. Extensive colonic diverticulosis. Electronically Signed   By: Monte Fantasia M.D.   On: 08/23/2020 06:25   US ABDOMEN LIMITED RUQ (LIVER/GB)  Result Date: 08/23/2020 CLINICAL DATA:  Right upper quadrant pain  EXAM: ULTRASOUND ABDOMEN LIMITED RIGHT UPPER QUADRANT COMPARISON:  CT a of the chest abdomen pelvis from yesterday FINDINGS: Gallbladder: Confirmed multiple small gallstones with shadowing, measuring up to 5 mm. There is gallbladder wall thickening and pericholecystic edema. No sonographic Murphy sign. Common bile duct: Diameter: 6 mm. Liver: Echogenic liver with geographic sparing at the gallbladder fossa. Portal vein is patent on color Doppler imaging with normal direction of blood flow towards the liver. IMPRESSION: 1. Cholelithiasis, gallbladder distension, and wall thickening. Findings suggest acute cholecystitis, although there is no sonographic Murphy sign. Please correlate with analgesic history and initial exam. 2. Hepatic steatosis. Electronically Signed   By: Monte Fantasia M.D.   On: 08/23/2020 07:37    Cardiac Studies:   Stress nuclear study 11/05/2013:  Exercise Capacity:  Lexiscan with no exercise. BP Response:  Normal blood pressure response. Clinical Symptoms:  There is dyspnea. ECG Impression:  No significant ECG changes with Lexiscan. Comparison with Prior Nuclear Study: No images to compare to, however the clinical repeat appears to be similar. Overall Impression:  Low risk stress nuclear study With mild apical breast attenuation. Cannot exclude small apical infarct. LV Wall Motion:  Unable to adequately it says the overall cardiac function as the gated images were not adequately established to capture wall motion.  Coronary angiogram 07/2017: mid circumflex 50%, D1 ostial 50% and mid 80%, very small. Mild noncritical CAD other vessels. EF 45%.  Echocardiogram 08/23/2020:     1. Left ventricular ejection fraction, by estimation, is 50 to 55%. The left ventricle has low normal function. The left ventricle has no regional wall motion abnormalities. There is mild left ventricular hypertrophy. Left ventricular diastolic  parameters were normal.  2. Right ventricular systolic  function is normal. The right ventricular size is normal.  3. Left atrial size was mildly dilated.  4. The mitral valve is normal in structure. Trivial mitral valve regurgitation. No evidence of mitral stenosis.  5. The aortic valve was not well visualized. Aortic valve regurgitation is mild. Mild aortic valve stenosis.  6. The inferior vena cava is normal in size with greater than 50% respiratory variability, suggesting right atrial pressure of 3 mmHg.   EKG  EKG 08/23/2020: Normal sinus rhythm with rate of 90 bpm, left axis deviation, left intrafascicular block, poor R wave progression, cannot exclude anteroseptal infarct old, marked ST-T abnormality, inferior and anterolateral ischemia versus subendocardial infarct.  PACs.  EKG 07/26/2020: Normal sinus rhythm with rate of 86 bpm, left axis deviation, left intrafascicular block.  IVCD, no  significant change from 08/22/2020 LVH with repolarization, cannot exclude inferior and lateral ischemia.Marland Kitchen  However lateral T wave abnormality is new compared to prior EKG.  Assessment   Julie Graves is a 80 y.o. AAF patient with morbid obesity, , nonischemic cardiomyopathy with EF 40% by echo in June 2019. Coronary angiogram on 08/21/17 and revealed mid circumflex 50% and small with 2 distal diagonal 80% stenosis. She has mild Chronic stable angina pectoris, mild to moderate aortic insufficiency, pre diabetes (Patient reluctant to starting metformin due to side-effects), hypertension, hyperlipidemia, former tobacco use with 15-pack-year history.  She underwent left total knee arthroplasty on 05/13/2020 without any periprocedural cardiac complications.    1.  Demand ischemia, serum troponins are flat.. Secondary troponin leak secondary to acute diastolic heart failure.  She has a diagonal lesion, small vessel that had high-grade stenosis by angiography in 2019.  EKG abnormality in the lateral leads could be related to this.  However repeat EKG reveals  extensive global changes and may indicate metabolic changes as troponins are minimally elevated and flat.  Subendocardial ischemia is still a potential in view of underlying risk factors. Echocardiogram also reveals fairly preserved LVEF without wall motion abnormality. 2.  Acute on chronic diastolic heart failure 3.  New EKG abnormality 4.  Nonischemic cardiomyopathy 5.  Morbid obesity  Recommendations:   Patient is in acute distress from Acute cholecystitis.  Although marked EKG abnormality, in view of flat serum troponins and echocardiogram essentially revealing low normal LVEF without wall motion abnormality, flat troponins, suspect metabolic issue and subendocardial ischemia and congestive heart failure to be the etiology for abnormal EKG.  She is presently n.p.o., leg edema is absent and there is no obvious JVD, suspect she will be stable for laparoscopic cholecystectomy tomorrow.  She is presently on intravenous metoprolol due to acute cholecystitis, continue the same.  No aggressive diuresis indicated for now as patient is n.p.o. and appears clinically dry and no acute respiratory distress.  There is no leg edema.  Patient can be taken up for laparoscopic cholecystectomy with low to intermediate risk for cardiovascular morbidity and mortality.   Adrian Prows, MD, [facility] 08/23/2020, 8:52 PM Office: [phone]

## 2020-08-23 NOTE — Progress Notes (Signed)
Okay to proceed with surgery with low risk. Elevated trop from mild acute diastolic heart fasilure.   Adrian Prows, MD, [facility] 08/23/2020, 9:45 AM Office: [phone] Pager: [phone]  C: [phone]

## 2020-08-24 ENCOUNTER — Inpatient Hospital Stay (HOSPITAL_COMMUNITY): Payer: Medicare Other | Admitting: Anesthesiology

## 2020-08-24 ENCOUNTER — Inpatient Hospital Stay (HOSPITAL_COMMUNITY): Payer: Medicare Other

## 2020-08-24 ENCOUNTER — Encounter (HOSPITAL_COMMUNITY): Payer: Self-pay | Admitting: Family Medicine

## 2020-08-24 ENCOUNTER — Encounter: Payer: Medicare Other | Admitting: Physical Therapy

## 2020-08-24 ENCOUNTER — Encounter (HOSPITAL_COMMUNITY)
Admission: EM | Disposition: A | Discharge status: Home / Self Care | Payer: Self-pay | Source: Home / Self Care | Attending: Family Medicine

## 2020-08-24 DIAGNOSIS — K810 Acute cholecystitis: Secondary | ICD-10-CM

## 2020-08-24 DIAGNOSIS — E119 Type 2 diabetes mellitus without complications: Secondary | ICD-10-CM

## 2020-08-24 DIAGNOSIS — I5032 Chronic diastolic (congestive) heart failure: Secondary | ICD-10-CM | POA: Diagnosis not present

## 2020-08-24 DIAGNOSIS — I5033 Acute on chronic diastolic (congestive) heart failure: Secondary | ICD-10-CM

## 2020-08-24 DIAGNOSIS — I10 Essential (primary) hypertension: Secondary | ICD-10-CM | POA: Diagnosis not present

## 2020-08-24 DIAGNOSIS — R778 Other specified abnormalities of plasma proteins: Secondary | ICD-10-CM

## 2020-08-24 HISTORY — PX: CHOLECYSTECTOMY: SHX55

## 2020-08-24 LAB — COMPREHENSIVE METABOLIC PANEL
ALT: 11 U/L (ref 0–44)
AST: 14 U/L — ABNORMAL LOW (ref 15–41)
Albumin: 3 g/dL — ABNORMAL LOW (ref 3.5–5.0)
Alkaline Phosphatase: 35 U/L — ABNORMAL LOW (ref 38–126)
Anion gap: 11 (ref 5–15)
BUN: 14 mg/dL (ref 8–23)
CO2: 18 mmol/L — ABNORMAL LOW (ref 22–32)
Calcium: 8.4 mg/dL — ABNORMAL LOW (ref 8.9–10.3)
Chloride: 109 mmol/L (ref 98–111)
Creatinine, Ser: 0.62 mg/dL (ref 0.44–1.00)
GFR, Estimated: >60 mL/min (ref >60)
Glucose, Bld: 95 mg/dL (ref 70–99)
Potassium: 3.5 mmol/L (ref 3.5–5.1)
Sodium: 138 mmol/L (ref 135–145)
Total Bilirubin: 1.3 mg/dL — ABNORMAL HIGH (ref 0.3–1.2)
Total Protein: 6.1 g/dL — ABNORMAL LOW (ref 6.5–8.1)

## 2020-08-24 LAB — CBC
HCT: 39.1 % (ref 36.0–46.0)
Hemoglobin: 12.3 g/dL (ref 12.0–15.0)
MCH: 30.4 pg (ref 26.0–34.0)
MCHC: 31.5 g/dL (ref 30.0–36.0)
MCV: 96.8 fL (ref 80.0–100.0)
Platelets: 208 10*3/uL (ref 150–400)
RBC: 4.04 MIL/uL (ref 3.87–5.11)
RDW: 13.5 % (ref 11.5–15.5)
WBC: 19.7 10*3/uL — ABNORMAL HIGH (ref 4.0–10.5)
nRBC: 0 % (ref 0.0–0.2)

## 2020-08-24 LAB — GLUCOSE, CAPILLARY
Glucose-Capillary: 83 mg/dL (ref 70–99)
Glucose-Capillary: 88 mg/dL (ref 70–99)
Glucose-Capillary: 83 mg/dL (ref 70–99)
Glucose-Capillary: 82 mg/dL (ref 70–99)

## 2020-08-24 LAB — LIPASE, BLOOD: Lipase: 18 U/L (ref 11–51)

## 2020-08-24 SURGERY — LAPAROSCOPIC CHOLECYSTECTOMY WITH INTRAOPERATIVE CHOLANGIOGRAM
Anesthesia: General

## 2020-08-24 MED ORDER — DEXAMETHASONE SODIUM PHOSPHATE 10 MG/ML IJ SOLN
INTRAMUSCULAR | Status: DC | PRN
  Administered 2020-08-24: 4 mg via INTRAVENOUS

## 2020-08-24 MED ORDER — BUPIVACAINE-EPINEPHRINE (PF) 0.25% -1:200000 IJ SOLN
INTRAMUSCULAR | Status: AC
  Filled 2020-08-24: qty 30

## 2020-08-24 MED ORDER — FENTANYL CITRATE (PF) 100 MCG/2ML IJ SOLN
25.0000 ug | INTRAMUSCULAR | Status: DC | PRN
  Administered 2020-08-24 (×2): 50 ug via INTRAVENOUS

## 2020-08-24 MED ORDER — PROPOFOL 10 MG/ML IV BOLUS
INTRAVENOUS | Status: DC | PRN
  Administered 2020-08-24: 130 mg via INTRAVENOUS

## 2020-08-24 MED ORDER — CARVEDILOL 6.25 MG PO TABS
6.2500 mg | ORAL_TABLET | Freq: Two times a day (BID) | ORAL | Status: DC
  Administered 2020-08-24 – 2020-08-26 (×4): 6.25 mg via ORAL
  Filled 2020-08-24 (×4): qty 1

## 2020-08-24 MED ORDER — FENTANYL CITRATE (PF) 250 MCG/5ML IJ SOLN
INTRAMUSCULAR | Status: DC | PRN
  Administered 2020-08-24: 100 ug via INTRAVENOUS

## 2020-08-24 MED ORDER — PHENYLEPHRINE 40 MCG/ML (10ML) SYRINGE FOR IV PUSH (FOR BLOOD PRESSURE SUPPORT)
PREFILLED_SYRINGE | INTRAVENOUS | Status: DC | PRN
  Administered 2020-08-24 (×4): 40 ug via INTRAVENOUS

## 2020-08-24 MED ORDER — SUGAMMADEX SODIUM 200 MG/2ML IV SOLN
INTRAVENOUS | Status: DC | PRN
  Administered 2020-08-24: 200 mg via INTRAVENOUS

## 2020-08-24 MED ORDER — FENTANYL CITRATE (PF) 100 MCG/2ML IJ SOLN
INTRAMUSCULAR | Status: AC
  Filled 2020-08-24: qty 2

## 2020-08-24 MED ORDER — AMISULPRIDE (ANTIEMETIC) 5 MG/2ML IV SOLN: 10.0000 mg | Freq: Once | INTRAVENOUS | Status: DC | PRN

## 2020-08-24 MED ORDER — LIDOCAINE 2% (20 MG/ML) 5 ML SYRINGE
INTRAMUSCULAR | Status: DC | PRN
  Administered 2020-08-24: 100 mg via INTRAVENOUS

## 2020-08-24 MED ORDER — ACETAMINOPHEN 325 MG PO TABS
650.0000 mg | ORAL_TABLET | Freq: Four times a day (QID) | ORAL | Status: DC
  Administered 2020-08-24 – 2020-08-26 (×9): 650 mg via ORAL
  Filled 2020-08-24 (×9): qty 2

## 2020-08-24 MED ORDER — ONDANSETRON HCL 4 MG/2ML IJ SOLN
INTRAMUSCULAR | Status: AC
  Filled 2020-08-24: qty 2

## 2020-08-24 MED ORDER — ROCURONIUM BROMIDE 10 MG/ML (PF) SYRINGE
PREFILLED_SYRINGE | INTRAVENOUS | Status: AC
  Filled 2020-08-24: qty 10

## 2020-08-24 MED ORDER — LIP MEDEX EX OINT
TOPICAL_OINTMENT | CUTANEOUS | Status: DC | PRN
  Filled 2020-08-24: qty 7

## 2020-08-24 MED ORDER — OXYCODONE HCL 5 MG PO TABS
2.5000 mg | ORAL_TABLET | ORAL | Status: DC | PRN
  Administered 2020-08-24 – 2020-08-26 (×6): 5 mg via ORAL
  Filled 2020-08-24 (×6): qty 1

## 2020-08-24 MED ORDER — 0.9 % SODIUM CHLORIDE (POUR BTL) OPTIME
TOPICAL | Status: DC | PRN
  Administered 2020-08-24: 1000 mL

## 2020-08-24 MED ORDER — DEXAMETHASONE SODIUM PHOSPHATE 10 MG/ML IJ SOLN
INTRAMUSCULAR | Status: AC
  Filled 2020-08-24: qty 1

## 2020-08-24 MED ORDER — LACTATED RINGERS IV SOLN: INTRAVENOUS | Status: DC

## 2020-08-24 MED ORDER — ROCURONIUM BROMIDE 10 MG/ML (PF) SYRINGE
PREFILLED_SYRINGE | INTRAVENOUS | Status: DC | PRN
  Administered 2020-08-24: 70 mg via INTRAVENOUS

## 2020-08-24 MED ORDER — INSULIN ASPART 100 UNIT/ML ~~LOC~~ SOLN: 0.0000 [IU] | Freq: Three times a day (TID) | SUBCUTANEOUS | Status: DC

## 2020-08-24 MED ORDER — LIDOCAINE 5 % EX PTCH
1.0000 | MEDICATED_PATCH | CUTANEOUS | Status: DC
  Administered 2020-08-24 – 2020-08-25 (×2): 1 via TRANSDERMAL
  Filled 2020-08-24 (×3): qty 1

## 2020-08-24 MED ORDER — BUPIVACAINE-EPINEPHRINE 0.25% -1:200000 IJ SOLN
INTRAMUSCULAR | Status: DC | PRN
  Administered 2020-08-24: 20 mL

## 2020-08-24 MED ORDER — ONDANSETRON HCL 4 MG/2ML IJ SOLN
INTRAMUSCULAR | Status: DC | PRN
  Administered 2020-08-24: 4 mg via INTRAVENOUS

## 2020-08-24 MED ORDER — PROPOFOL 10 MG/ML IV BOLUS
INTRAVENOUS | Status: AC
  Filled 2020-08-24: qty 40

## 2020-08-24 MED ORDER — PHENYLEPHRINE HCL (PRESSORS) 10 MG/ML IV SOLN
INTRAVENOUS | Status: AC
  Filled 2020-08-24: qty 1

## 2020-08-24 MED ORDER — ACETAMINOPHEN 500 MG PO TABS
1000.0000 mg | ORAL_TABLET | Freq: Once | ORAL | Status: AC
  Administered 2020-08-24: 1000 mg via ORAL
  Filled 2020-08-24: qty 2

## 2020-08-24 MED ORDER — CELECOXIB 200 MG PO CAPS
200.0000 mg | ORAL_CAPSULE | Freq: Once | ORAL | Status: AC
  Administered 2020-08-24: 200 mg via ORAL
  Filled 2020-08-24: qty 1

## 2020-08-24 MED ORDER — LACTATED RINGERS IV SOLN
INTRAVENOUS | Status: DC | PRN
  Administered 2020-08-24: 1000 mL

## 2020-08-24 SURGICAL SUPPLY — 38 items
APPLIER CLIP ROT 10 11.4 M/L (STAPLE) ×4
CHLORAPREP W/TINT 26 (MISCELLANEOUS) ×2 IMPLANT
CLIP APPLIE ROT 10 11.4 M/L (STAPLE) ×1 IMPLANT
COVER MAYO STAND STRL (DRAPES) ×2 IMPLANT
COVER WAND RF STERILE (DRAPES) IMPLANT
DECANTER SPIKE VIAL GLASS SM (MISCELLANEOUS) ×2 IMPLANT
DERMABOND ADVANCED (GAUZE/BANDAGES/DRESSINGS)
DERMABOND ADVANCED .7 DNX12 (GAUZE/BANDAGES/DRESSINGS) IMPLANT
DEVICE TROCAR PUNCTURE CLOSURE (ENDOMECHANICALS) ×1 IMPLANT
DRAPE C-ARM 42X120 X-RAY (DRAPES) ×2 IMPLANT
DRAPE UTILITY XL STRL (DRAPES) ×2 IMPLANT
DRAPE WARM FLUID 44X44 (DRAPES) ×2 IMPLANT
ELECT REM PT RETURN 15FT ADLT (MISCELLANEOUS) ×2 IMPLANT
GLOVE INDICATOR 8.0 STRL GRN (GLOVE) ×2 IMPLANT
GLOVE SS BIOGEL STRL SZ 7.5 (GLOVE) ×1 IMPLANT
GLOVE SUPERSENSE BIOGEL SZ 7.5 (GLOVE) ×1
GOWN STRL REUS W/TWL XL LVL3 (GOWN DISPOSABLE) ×4 IMPLANT
HEMOSTAT SNOW SURGICEL 2X4 (HEMOSTASIS) ×2 IMPLANT
HEMOSTAT SURGICEL 4X8 (HEMOSTASIS) IMPLANT
KIT BASIN OR (CUSTOM PROCEDURE TRAY) ×2 IMPLANT
KIT TURNOVER KIT A (KITS) ×2 IMPLANT
PENCIL SMOKE EVACUATOR (MISCELLANEOUS) IMPLANT
POUCH SPECIMEN RETRIEVAL 10MM (ENDOMECHANICALS) ×2 IMPLANT
PROTECTOR NERVE ULNAR (MISCELLANEOUS) IMPLANT
SCISSORS LAP 5X35 DISP (ENDOMECHANICALS) IMPLANT
SET CHOLANGIOGRAPH MIX (MISCELLANEOUS) ×2 IMPLANT
SET IRRIG TUBING LAPAROSCOPIC (IRRIGATION / IRRIGATOR) ×2 IMPLANT
SET TUBE SMOKE EVAC HIGH FLOW (TUBING) IMPLANT
SLEEVE XCEL OPT CAN 5 100 (ENDOMECHANICALS) ×2 IMPLANT
SUT MNCRL AB 4-0 PS2 18 (SUTURE) ×2 IMPLANT
SUT VICRYL 0 UR6 27IN ABS (SUTURE) ×2 IMPLANT
TAPE CLOTH 4X10 WHT NS (GAUZE/BANDAGES/DRESSINGS) IMPLANT
TOWEL OR 17X26 10 PK STRL BLUE (TOWEL DISPOSABLE) ×2 IMPLANT
TOWEL OR NON WOVEN STRL DISP B (DISPOSABLE) ×2 IMPLANT
TRAY LAPAROSCOPIC (CUSTOM PROCEDURE TRAY) ×2 IMPLANT
TROCAR BLADELESS OPT 5 100 (ENDOMECHANICALS) ×2 IMPLANT
TROCAR XCEL BLUNT TIP 100MML (ENDOMECHANICALS) ×2 IMPLANT
TROCAR XCEL NON-BLD 11X100MML (ENDOMECHANICALS) ×2 IMPLANT

## 2020-08-24 NOTE — Anesthesia Postprocedure Evaluation (Signed)
Anesthesia Post Note  Patient: Julie Graves  Procedure(s) Performed: LAPAROSCOPIC CHOLECYSTECTOMY (N/A )     Patient location during evaluation: PACU Anesthesia Type: General Level of consciousness: awake and alert Pain management: pain level controlled Vital Signs Assessment: post-procedure vital signs reviewed and stable Respiratory status: spontaneous breathing and respiratory function stable Cardiovascular status: blood pressure returned to baseline and stable Postop Assessment: spinal receding Anesthetic complications: no   No complications documented.  Last Vitals:  Vitals:   08/24/20 1145 08/24/20 1200  BP: 137/71 (!) 141/69  Pulse: 70 69  Resp: 13 18  Temp: 36.8 C 36.6 C  SpO2: 99% 100%    Last Pain:  Vitals:   08/24/20 1200  TempSrc: Oral  PainSc: [place]

## 2020-08-24 NOTE — Transfer of Care (Signed)
Immediate Anesthesia Transfer of Care Note  Patient: Julie Graves  Procedure(s) Performed: LAPAROSCOPIC CHOLECYSTECTOMY (N/A )  Patient Location: PACU  Anesthesia Type:General  Level of Consciousness: awake and patient cooperative  Airway & Oxygen Therapy: Patient Spontanous Breathing and Patient connected to face mask oxygen  Post-op Assessment: Report given to RN and Post -op Vital signs reviewed and stable  Post vital signs: Reviewed and stable  Last Vitals:  Vitals Value Taken Time  BP 149/128 08/24/20 1112  Temp    Pulse 80 08/24/20 1113  Resp 20 08/24/20 1113  SpO2 100 % 08/24/20 1113  Vitals shown include unvalidated device data.  Last Pain:  Vitals:   08/24/20 0825  TempSrc:   PainSc: 8       Patients Stated Pain Goal: 2 (33/35/45 6256)  Complications: No complications documented.

## 2020-08-24 NOTE — Interval H&P Note (Signed)
History and Physical Interval Note:  08/24/2020 8:53 AM  Julie Graves  has presented today for surgery, with the diagnosis of ACUTE CHOLECYSTITIS, CHOLELITHIASIS.  The various methods of treatment have been discussed with the patient and family. After consideration of risks, benefits and other options for treatment, the patient has consented to  Procedure(s): LAPAROSCOPIC CHOLECYSTECTOMY WITH INTRAOPERATIVE CHOLANGIOGRAM (N/A) as a surgical intervention.  The patient's history has been reviewed, patient examined, no change in status, stable for surgery.  I have reviewed the patient's chart and labs.  Questions were answered to the patient's satisfaction.     [place]

## 2020-08-24 NOTE — Op Note (Signed)
Laparoscopic Cholecystectomy  Procedure Note  Indications: This patient presents with symptomatic gallbladder disease and will undergo laparoscopic cholecystectomy.The procedure has been discussed with the patient. Operative and non operative treatments have been discussed. Risks of surgery include bleeding, infection,  Common bile duct injury,  Injury to the stomach,liver, colon,small intestine, abdominal wall,  Diaphragm,  Major blood vessels,  And the need for an open procedure.  Other risks include worsening of medical problems, death,  DVT and pulmonary embolism, and cardiovascular events.   Medical options have also been discussed. The patient has been informed of long term expectations of surgery and non surgical options,  The patient agrees to proceed.    Pre-operative Diagnosis: Calculus of gallbladder with acute cholecystitis, without mention of obstruction  Post-operative Diagnosis: Same  Surgeon: Turner Daniels  MD   Assistants: OR staff   Anesthesia: General endotracheal anesthesia and Local anesthesia 0.25.% bupivacaine  ASA Class: 3  Procedure Details  The patient was seen again in the Holding Room. The risks, benefits, complications, treatment options, and expected outcomes were discussed with the patient. The possibilities of reaction to medication, pulmonary aspiration, perforation of viscus, bleeding, recurrent infection, finding a normal gallbladder, the need for additional procedures, failure to diagnose a condition, the possible need to convert to an open procedure, and creating a complication requiring transfusion or operation were discussed with the patient. The patient and/or family concurred with the proposed plan, giving informed consent. The site of surgery properly noted/marked. The patient was taken to Operating Room, identified as Julie Graves and the procedure verified as Laparoscopic Cholecystectomy with  Possible Intraoperative Cholangiograms. A Time Out  was held and the above information confirmed.  Prior to the induction of general anesthesia, antibiotic prophylaxis was administered. General endotracheal anesthesia was then administered and tolerated well. After the induction, the abdomen was prepped in the usual sterile fashion. The patient was positioned in the supine position with the left arm comfortably tucked, along with some reverse Trendelenburg.  Local anesthetic agent was injected into the skin near the umbilicus and an incision made. Due to obesity, I could not access the fascia. I opted to place an 11 mm Optiview port under direct vision without evidence of injury.   Pneumoperitoneum was then created with CO2 and tolerated well without any adverse changes in the patient's vital signs. 11 mm port placed at the umbilicus without complication.   Additional trocars were introduced under direct vision with an 11 mm trocar in the epigastrium and two  5 mm trocars in the right upper quadrant. All skin incisions were infiltrated with a local anesthetic agent before making the incision and placing the trocars.   The gallbladder was identified and found to be necrotic with gangrenous changes.  The gallbladder was decompressed. The fundus grasped and retracted cephalad. Adhesions were lysed bluntly and with the electrocautery where indicated, taking care not to injure any adjacent organs or viscus. The infundibulum was grasped and retracted laterally, exposing the peritoneum overlying the triangle of Calot. This was then divided and exposed in a blunt fashion. The cystic duct was clearly identified and bluntly dissected circumferentially. The junctions of the gallbladder, cystic duct and common bile duct were clearly identified prior to the division of any linear structure.   The cystic duct was necrotic and I did not feel a cholangiogram possible due to concern for avulsion or significant injury.   The cystic duct was then  ligated with surgical clips   on the patient  side and  clipped on the gallbladder side and divided. The cystic artery was identified, dissected free, ligated with clips and divided as well. Posterior cystic artery clipped and divided.  The gallbladder was dissected from the liver bed in retrograde fashion with the electrocautery. The gallbladder was removed. The liver bed was irrigated and inspected. Hemostasis was achieved with the electrocautery and surgicel. . Copious irrigation was utilized and was repeatedly aspirated until clear all particulate matter. Hemostasis was achieved with no signs  Of bleeding or bile leakage. Port site at umbilicus closed with 0 vicryl and Endostitch.  Epigastric port fascia closed with 0 Vicryl.  Pneumoperitoneum was completely reduced after viewing removal of the trocars under direct vision. The wound was thoroughly irrigated and the fascia was then closed with a figure of eight suture; the skin was then closed with 4-0 monocryl  and a sterile dressing was applied.  Instrument, sponge, and needle counts were correct at closure and at the conclusion of the case.   Findings: Cholecystitis with Cholelithiasis  Estimated Blood Loss: less than 50 mL         Drains: none         Total IV Fluids: per record          Specimens: Gallbladder           Complications: None; patient tolerated the procedure well.         Disposition: PACU - hemodynamically stable.         Condition: stable

## 2020-08-24 NOTE — Progress Notes (Signed)
PROGRESS NOTE    Julie Graves  GMW:[id number] DOB: Oct 13, 1940 DOA: 08/22/2020 PCP: Harrison Mons, PA   Brief Narrative: Julie Graves is a 80 y.o. female with a history of hypertension, hyperlipidemia, non-ischemic cardiomyopathy, chronic diastolic heart failure, diabetes, aortic insufficiency, stable angina. Patient presented secondary to chest pain with evidence of sepsis and cholecystitis. Patient started on empiric antibiotics and underwent cholecystectomy.   Assessment & Plan:   Principal Problem:   Acute cholecystitis Active Problems:   Essential hypertension   Chronic diastolic heart failure (HCC)   Type II diabetes mellitus (HCC)   Chest pain   Mixed dyslipidemia   Elevated troponin   Sepsis Present on admission. Secondary to acute cholecystitis. Empiric Cefepime and Flagyl initiated on admission.  Acute cholecystitis General surgery consulted on admission. Patient underwent cholecystectomy on 3/2. No intraoperative cholangiogram performed secondary to concern for avulsion/significant injury. -General surgery recommendations  Left sided chest pain Cardiology consulted. Associated elevated troponin with upward trend and peak of 192. Troponin from demand ischemia in setting of heart failure. Transthoracic Echocardiogram without wall motion abnormality. No further recommendations per cardiology. Outpatient follow-up.  Diabetes mellitus, type 2 Patient is on Jardiance and metformin as an outpatient. Hemoglobin A1C of 5.9% from 04/2020. -SSI while inpatient  Non-ischemic cardiomyopathy Acute on chronic diastolic heart failure Patient is on Coreg and losartan as an outpatient. Evidence of mild acute failure on admission in setting of cholecystitis now appears resolved. -Resume Coreg  Hyperlipidemia On simvastatin as an outpatient which is held on admission.  Primary hypertension Patient is on Coreg, spironolactone and losartan as an outpatient which  were held secondary to NPO status.   DVT prophylaxis: Per general surgery post-op Code Status:   Code Status: Full Code Family Communication: None at bedside Disposition Plan: Discharge likely in 2-3 days pending general surgery recommendations, ambulation/possible PT recommendations   Consultants:   General surgery  Cardiology  Procedures:   LAPAROSCOPIC CHOLECYSTECTOMY (08/24/2020)  Antimicrobials:  Cefepime IV  Flagyl IV    Subjective: No concerns this afternoon. Some abdominal pain.  Objective: Vitals:   08/24/20 1130 08/24/20 1145 08/24/20 1200 08/24/20 1348  BP: (!) 106/97 137/71 (!) 141/69 136/72  Pulse: 76 70 69 71  Resp: 18 13 18 18   Temp:  98.3 F (36.8 C) 97.9 F (36.6 C) 98 F (36.7 C)  TempSrc:   Oral   SpO2: 100% 99% 100% 100%  Weight:      Height:        Intake/Output Summary (Last 24 hours) at 08/24/2020 1528 Last data filed at 08/24/2020 1114 Gross per 24 hour  Intake 4563.54 ml  Output 1250 ml  Net 3313.54 ml   Filed Weights   08/22/20 2322 08/24/20 0500  Weight: 90.7 kg 95.1 kg    Examination:  General exam: Appears calm and comfortable Respiratory system: Clear to auscultation. Respiratory effort normal. Cardiovascular system: S1 & S2 heard, RRR. No murmurs, rubs, gallops or clicks. Gastrointestinal system: Abdomen is nondistended, soft and nontender. No organomegaly or masses felt. Normal bowel sounds heard. Central nervous system: Alert and oriented. No focal neurological deficits. Musculoskeletal: No edema. No calf tenderness Skin: No cyanosis. No rashes Psychiatry: Judgement and insight appear normal. Mood & affect appropriate.     Data Reviewed: I have personally reviewed following labs and imaging studies  CBC Lab Results  Component Value Date   WBC 19.7 (H) 08/24/2020   RBC 4.04 08/24/2020   HGB 12.3 08/24/2020   HCT 39.1 08/24/2020  MCV 96.8 08/24/2020   MCH 30.4 08/24/2020   PLT 208 08/24/2020   MCHC 31.5  08/24/2020   RDW 13.5 08/24/2020   LYMPHSABS 1.2 06/04/2019   MONOABS 0.5 06/04/2019   EOSABS 0.2 06/04/2019   BASOSABS 0.0 93/26/7124     Last metabolic panel Lab Results  Component Value Date   NA 138 08/24/2020   K 3.5 08/24/2020   CL 109 08/24/2020   CO2 18 (L) 08/24/2020   BUN 14 08/24/2020   CREATININE 0.62 08/24/2020   GLUCOSE 95 08/24/2020   GFRNONAA >60 08/24/2020   GFRAA 93 09/29/2019   CALCIUM 8.4 (L) 08/24/2020   PROT 6.1 (L) 08/24/2020   ALBUMIN 3.0 (L) 08/24/2020   BILITOT 1.3 (H) 08/24/2020   ALKPHOS 35 (L) 08/24/2020   AST 14 (L) 08/24/2020   ALT 11 08/24/2020   ANIONGAP 11 08/24/2020    CBG (last 3)  Recent Labs    08/24/20 0834 08/24/20 1114  GLUCAP 83 88     GFR: Estimated Creatinine Clearance: 58.8 mL/min (by C-G formula based on SCr of 0.62 mg/dL).  Coagulation Profile: No results for input(s): INR, PROTIME in the last 168 hours.  Recent Results (from the past 240 hour(s))  Resp Panel by RT-PCR (Flu A&B, Covid) Nasopharyngeal Swab     Status: None   Collection Time: 08/22/20 11:27 PM   Specimen: Nasopharyngeal Swab; Nasopharyngeal(NP) swabs in vial transport medium  Result Value Ref Range Status   SARS Coronavirus 2 by RT PCR NEGATIVE NEGATIVE Final    Comment: (NOTE) SARS-CoV-2 target nucleic acids are NOT DETECTED.  The SARS-CoV-2 RNA is generally detectable in upper respiratory specimens during the acute phase of infection. The lowest concentration of SARS-CoV-2 viral copies this assay can detect is 138 copies/mL. A negative result does not preclude SARS-Cov-2 infection and should not be used as the sole basis for treatment or other patient management decisions. A negative result may occur with  improper specimen collection/handling, submission of specimen other than nasopharyngeal swab, presence of viral mutation(s) within the areas targeted by this assay, and inadequate number of viral copies(<138 copies/mL). A negative result  must be combined with clinical observations, patient history, and epidemiological information. The expected result is Negative.  Fact Sheet for Patients:  EntrepreneurPulse.com.au  Fact Sheet for Healthcare Providers:  IncredibleEmployment.be  This test is no t yet approved or cleared by the [place] FDA and  has been authorized for detection and/or diagnosis of SARS-CoV-2 by FDA under an Emergency Use Authorization (EUA). This EUA will remain  in effect (meaning this test can be used) for the duration of the COVID-19 declaration under Section 564(b)(1) of the Act, 21 U.S.C.section 360bbb-3(b)(1), unless the authorization is terminated  or revoked sooner.       Influenza A by PCR NEGATIVE NEGATIVE Final   Influenza B by PCR NEGATIVE NEGATIVE Final    Comment: (NOTE) The Xpert Xpress SARS-CoV-2/FLU/RSV plus assay is intended as an aid in the diagnosis of influenza from Nasopharyngeal swab specimens and should not be used as a sole basis for treatment. Nasal washings and aspirates are unacceptable for Xpert Xpress SARS-CoV-2/FLU/RSV testing.  Fact Sheet for Patients: EntrepreneurPulse.com.au  Fact Sheet for Healthcare Providers: IncredibleEmployment.be  This test is not yet approved or cleared by the [place] FDA and has been authorized for detection and/or diagnosis of SARS-CoV-2 by FDA under an Emergency Use Authorization (EUA). This EUA will remain in effect (meaning this test can be used) for the duration of  the COVID-19 declaration under Section 564(b)(1) of the Act, 21 U.S.C. section 360bbb-3(b)(1), unless the authorization is terminated or revoked.  Performed at [facility], [place] [address], [place], [place] [address]         Radiology Studies: DG Chest 2 View  Result Date: 08/22/2020 CLINICAL DATA:  Shortness of breath. EXAM: CHEST - 2 VIEW COMPARISON:   08/13/2017 FINDINGS: The heart size is stable but enlarged. Aortic calcifications are noted. There is no pneumothorax or large pleural effusion. No focal infiltrate. No acute osseous abnormality. IMPRESSION: No active cardiopulmonary disease. Electronically Signed   By: Constance Holster M.D.   On: 08/22/2020 19:49   ECHOCARDIOGRAM COMPLETE  Result Date: 08/23/2020    ECHOCARDIOGRAM REPORT   Patient Name:   MAXIMINA PIROZZI Date of Exam: 08/23/2020 Medical Rec #:  [id number]          Height:       60.0 in Accession #:    [id number]         Weight:       200.0 lb Date of Birth:  May 22, 1941          BSA:          1.866 m Patient Age:    13 years           BP:           101/56 mmHg Patient Gender: F                  HR:           84 bpm. Exam Location:  Inpatient Procedure: 2D Echo and Intracardiac Opacification Agent Indications:    Elevated Troponin  History:        Patient has prior history of Echocardiogram examinations, most                 recent 09/26/2019. CHF, CAD; Risk Factors:Hypertension, Diabetes                 and Dyslipidemia.  Sonographer:    Mikki Santee RDCS (AE) Referring Phys: [id number] [place] E SEGAL IMPRESSIONS  1. Left ventricular ejection fraction, by estimation, is 50 to 55%. The left ventricle has low normal function. The left ventricle has no regional wall motion abnormalities. There is mild left ventricular hypertrophy. Left ventricular diastolic parameters were normal.  2. Right ventricular systolic function is normal. The right ventricular size is normal.  3. Left atrial size was mildly dilated.  4. The mitral valve is normal in structure. Trivial mitral valve regurgitation. No evidence of mitral stenosis.  5. The aortic valve was not well visualized. Aortic valve regurgitation is mild. Mild aortic valve stenosis.  6. The inferior vena cava is normal in size with greater than 50% respiratory variability, suggesting right atrial pressure of 3 mmHg. FINDINGS  Left Ventricle: Left  ventricular ejection fraction, by estimation, is 50 to 55%. The left ventricle has low normal function. The left ventricle has no regional wall motion abnormalities. Definity contrast agent was given IV to delineate the left ventricular endocardial borders. The left ventricular internal cavity size was normal in size. There is mild left ventricular hypertrophy. Left ventricular diastolic parameters were normal. Right Ventricle: The right ventricular size is normal. No increase in right ventricular wall thickness. Right ventricular systolic function is normal. Left Atrium: Left atrial size was mildly dilated. Right Atrium: Right atrial size was normal in size. Pericardium: There is no evidence of pericardial effusion. Mitral Valve:  The mitral valve is normal in structure. Trivial mitral valve regurgitation. No evidence of mitral valve stenosis. Tricuspid Valve: The tricuspid valve is normal in structure. Tricuspid valve regurgitation is not demonstrated. No evidence of tricuspid stenosis. Aortic Valve: The aortic valve was not well visualized. Aortic valve regurgitation is mild. Aortic regurgitation PHT measures 283 msec. Mild aortic stenosis is present. Aortic valve mean gradient measures 12.5 mmHg. Aortic valve peak gradient measures 21.0 mmHg. Aortic valve area, by VTI measures 1.73 cm. Pulmonic Valve: The pulmonic valve was normal in structure. Pulmonic valve regurgitation is not visualized. No evidence of pulmonic stenosis. Aorta: The aortic root is normal in size and structure. Venous: The inferior vena cava is normal in size with greater than 50% respiratory variability, suggesting right atrial pressure of 3 mmHg. IAS/Shunts: No atrial level shunt detected by color flow Doppler.  LEFT VENTRICLE PLAX 2D LVIDd:         4.40 cm  Diastology LVIDs:         3.60 cm  LV e' medial:    5.55 cm/s LV PW:         1.10 cm  LV E/e' medial:  13.8 LV IVS:        1.30 cm  LV e' lateral:   7.18 cm/s LVOT diam:     2.10 cm  LV  E/e' lateral: 10.7 LV SV:         78 LV SV Index:   42 LVOT Area:     3.46 cm  RIGHT VENTRICLE RV S prime:     8.05 cm/s TAPSE (M-mode): 2.2 cm LEFT ATRIUM             Index       RIGHT ATRIUM           Index LA diam:        4.00 cm 2.14 cm/m  RA Area:     12.30 cm LA Vol (A2C):   65.6 ml 35.15 ml/m RA Volume:   28.50 ml  15.27 ml/m LA Vol (A4C):   48.1 ml 25.77 ml/m LA Biplane Vol: 57.2 ml 30.65 ml/m  AORTIC VALVE AV Area (Vmax):    1.74 cm AV Area (Vmean):   1.68 cm AV Area (VTI):     1.73 cm AV Vmax:           229.00 cm/s AV Vmean:          167.000 cm/s AV VTI:            0.452 m AV Peak Grad:      21.0 mmHg AV Mean Grad:      12.5 mmHg LVOT Vmax:         115.00 cm/s LVOT Vmean:        80.900 cm/s LVOT VTI:          0.226 m LVOT/AV VTI ratio: 0.50 AI PHT:            283 msec  AORTA Ao Root diam: 3.00 cm MITRAL VALVE MV Area (PHT): 2.29 cm     SHUNTS MV Decel Time: 331 msec     Systemic VTI:  0.23 m MV E velocity: 76.50 cm/s   Systemic Diam: 2.10 cm MV A velocity: 102.00 cm/s MV E/A ratio:  0.75 Jenkins Rouge MD Electronically signed by Jenkins Rouge MD Signature Date/Time: 08/23/2020/3:05:22 PM    Final    CT Angio Chest/Abd/Pel for Dissection W and/or Wo Contrast  Result Date: 08/23/2020 CLINICAL DATA:  Abdominal pain with aortic dissection suspected EXAM: CT ANGIOGRAPHY CHEST, ABDOMEN AND PELVIS TECHNIQUE: Non-contrast CT of the chest was initially obtained. Multidetector CT imaging through the chest, abdomen and pelvis was performed using the standard protocol during bolus administration of intravenous contrast. Multiplanar reconstructed images and MIPs were obtained and reviewed to evaluate the vascular anatomy. CONTRAST:  174mL OMNIPAQUE IOHEXOL 350 MG/ML SOLN COMPARISON:  Abdomen and pelvis CT 01/22/2005 FINDINGS: CTA CHEST FINDINGS Cardiovascular: Preferential opacification of the thoracic aorta. No evidence of thoracic aortic aneurysm or dissection. Normal heart size. No pericardial effusion.  Aortic and coronary atheromatous calcification. Mediastinum/Nodes: 13 mm nodule in the left thyroid. No followup recommended.(Ref: J Am Coll Radiol. 2015 Feb;12(2): 143-50). Lungs/Pleura: Mild dependent atelectasis. Assessment is limited by respiratory motion. There is no edema, consolidation, effusion, or pneumothorax. Musculoskeletal: Spondylosis Review of the MIP images confirms the above findings. CTA ABDOMEN AND PELVIS FINDINGS VASCULAR Aorta: Overall mild atheromatous plaque.  No aneurysm or dissection. Celiac: Plaque at the ostium without significant stenosis. No branch occlusion, beading, or aneurysm. SMA: Patent without evidence of aneurysm, dissection, vasculitis or significant stenosis. Renals: Atheromatous plaque at the left renal artery ostium. No stenosis, beading, or are aneurysm. IMA: Patent Inflow: Atheromatous plaque. Veins: Unremarkable in the arterial phase Review of the MIP images confirms the above findings. NON-VASCULAR Hepatobiliary: Chronic liver steatosis. Higher density towards the gallbladder fossa could be hyperemia or sparing.Distended gallbladder with pericholecystic edema and wall thickening. No calcified gallstone detected. No bile duct dilatation. Pancreas: Unremarkable. Spleen: Unremarkable. Adrenals/Urinary Tract: Negative adrenals. No hydronephrosis or stone. Unremarkable bladder. Stomach/Bowel: No obstruction. No visible bowel inflammation. Innumerable colonic diverticula. Lymphatic: No mass or adenopathy. Reproductive:Hysterectomy Other: No ascites or pneumoperitoneum. Musculoskeletal: No acute abnormalities. Sacroiliac osteoarthritis with sclerosis. Lumbar spine degeneration with mild levoscoliosis. Review of the MIP images confirms the above findings. IMPRESSION: 1. Findings of acute cholecystitis but no calcified gallstones. Suggest right upper quadrant ultrasound. 2. No acute aortic syndrome. 3.  Aortic Atherosclerosis (ICD10-I70.0). 4. Extensive colonic diverticulosis.  Electronically Signed   By: Monte Fantasia M.D.   On: 08/23/2020 06:25   US ABDOMEN LIMITED RUQ (LIVER/GB)  Result Date: 08/23/2020 CLINICAL DATA:  Right upper quadrant pain EXAM: ULTRASOUND ABDOMEN LIMITED RIGHT UPPER QUADRANT COMPARISON:  CT a of the chest abdomen pelvis from yesterday FINDINGS: Gallbladder: Confirmed multiple small gallstones with shadowing, measuring up to 5 mm. There is gallbladder wall thickening and pericholecystic edema. No sonographic Murphy sign. Common bile duct: Diameter: 6 mm. Liver: Echogenic liver with geographic sparing at the gallbladder fossa. Portal vein is patent on color Doppler imaging with normal direction of blood flow towards the liver. IMPRESSION: 1. Cholelithiasis, gallbladder distension, and wall thickening. Findings suggest acute cholecystitis, although there is no sonographic Murphy sign. Please correlate with analgesic history and initial exam. 2. Hepatic steatosis. Electronically Signed   By: Monte Fantasia M.D.   On: 08/23/2020 07:37        Scheduled Meds: . acetaminophen  650 mg Oral Q6H  . lidocaine  1 patch Transdermal Q24H  . metoprolol tartrate  2.5 mg Intravenous Q6H  . sodium chloride flush  3 mL Intravenous Q12H   Continuous Infusions: . sodium chloride 1,000 mL (08/23/20 1603)  . ceFEPime (MAXIPIME) IV 2 g (08/24/20 1504)  . metronidazole 500 mg (08/24/20 0622)     LOS: 1 day     Cordelia Poche, MD [facility] 08/24/2020, 3:28 PM  If 7PM-7AM, please contact night-coverage [email or web address]

## 2020-08-24 NOTE — Discharge Instructions (Signed)
[facility] LAPAROSCOPIC SURGERY: POST OP INSTRUCTIONS Always review your discharge instruction sheet given to you by the facility where your surgery was performed. IF YOU HAVE DISABILITY OR FAMILY LEAVE FORMS, YOU MUST BRING THEM TO THE OFFICE FOR PROCESSING.   DO NOT GIVE THEM TO YOUR DOCTOR.  PAIN CONTROL  1. First take acetaminophen (Tylenol) AND/or ibuprofen (Advil) to control your pain after surgery.  Follow directions on package.  Taking acetaminophen (Tylenol) and/or ibuprofen (Advil) regularly after surgery will help to control your pain and lower the amount of prescription pain medication you may need.  You should not take more than 3,000 mg (3 grams) of acetaminophen (Tylenol) in 24 hours.  You should not take ibuprofen (Advil), aleve, motrin, naprosyn or other NSAIDS if you have a history of stomach ulcers or chronic kidney disease.  2. A prescription for pain medication may be given to you upon discharge.  Take your pain medication as prescribed, if you still have uncontrolled pain after taking acetaminophen (Tylenol) or ibuprofen (Advil). 3. Use ice packs to help control pain. 4. If you need a refill on your pain medication, please contact your pharmacy.  They will contact our office to request authorization. Prescriptions will not be filled after 5pm or on week-ends.  HOME MEDICATIONS 5. Take your usually prescribed medications unless otherwise directed.  DIET 6. You should follow a light diet the first few days after arrival home.  Be sure to include lots of fluids daily. Avoid fatty, fried foods.   CONSTIPATION 7. It is common to experience some constipation after surgery and if you are taking pain medication.  Increasing fluid intake and taking a stool softener (such as Colace) will usually help or prevent this problem from occurring.  A mild laxative (Milk of Magnesia or Miralax) should be taken according to package instructions if there are no bowel  movements after 48 hours.  WOUND/INCISION CARE 8. Most patients will experience some swelling and bruising in the area of the incisions.  Ice packs will help.  Swelling and bruising can take several days to resolve.  9. Unless discharge instructions indicate otherwise, follow guidelines below  a. STERI-STRIPS - you may remove your outer bandages 48 hours after surgery, and you may shower at that time.  You have steri-strips (small skin tapes) in place directly over the incision.  These strips should be left on the skin for 7-10 days.   b. DERMABOND/SKIN GLUE - you may shower in 24 hours.  The glue will flake off over the next 2-3 weeks. 10. Any sutures or staples will be removed at the office during your follow-up visit.  ACTIVITIES 11. You may resume regular (light) daily activities beginning the next day--such as daily self-care, walking, climbing stairs--gradually increasing activities as tolerated.  You may have sexual intercourse when it is comfortable.  Refrain from any heavy lifting or straining until approved by your doctor. a. You may drive when you are no longer taking prescription pain medication, you can comfortably wear a seatbelt, and you can safely maneuver your car and apply brakes.  FOLLOW-UP 12. You should see your doctor in the office for a follow-up appointment approximately 2-3 weeks after your surgery.  You should have been given your post-op/follow-up appointment when your surgery was scheduled.  If you did not receive a post-op/follow-up appointment, make sure that you call for this appointment within a day or two after you arrive home to insure a convenient appointment time.  WHEN   TO CALL YOUR DOCTOR: 1. Fever over 101.0 2. Inability to urinate 3. Continued bleeding from incision. 4. Increased pain, redness, or drainage from the incision. 5. Increasing abdominal pain  The clinic staff is available to answer your questions during regular business hours.  Please don't  hesitate to call and ask to speak to one of the nurses for clinical concerns.  If you have a medical emergency, go to the nearest emergency room or call 911.  A surgeon from [facility] is always on call at the hospital. [address], [address], [place], [place]  [address] ? [address] [address], [place], [place]   [address] [phone] ? [phone] ? FAX [phone] Web site: [email or web address]  

## 2020-08-24 NOTE — Anesthesia Procedure Notes (Signed)
Procedure Name: Intubation Date/Time: 08/24/2020 9:14 AM Performed by: Eben Burow, CRNA Pre-anesthesia Checklist: Patient identified, Emergency Drugs available, Suction available, Patient being monitored and Timeout performed Patient Re-evaluated:Patient Re-evaluated prior to induction Oxygen Delivery Method: Circle system utilized Preoxygenation: Pre-oxygenation with 100% oxygen Induction Type: IV induction Ventilation: Mask ventilation without difficulty Laryngoscope Size: Mac and 4 Grade View: Grade I Tube type: Oral Tube size: 7.0 mm Number of attempts: 1 Airway Equipment and Method: Stylet Placement Confirmation: ETT inserted through vocal cords under direct vision,  positive ETCO2 and breath sounds checked- equal and bilateral Secured at: 22 cm Tube secured with: Tape Dental Injury: Teeth and Oropharynx as per pre-operative assessment

## 2020-08-25 ENCOUNTER — Encounter (HOSPITAL_COMMUNITY): Payer: Self-pay | Admitting: Surgery

## 2020-08-25 ENCOUNTER — Encounter: Payer: Medicare Other | Admitting: Physical Therapy

## 2020-08-25 DIAGNOSIS — K810 Acute cholecystitis: Secondary | ICD-10-CM | POA: Diagnosis not present

## 2020-08-25 DIAGNOSIS — I10 Essential (primary) hypertension: Secondary | ICD-10-CM | POA: Diagnosis not present

## 2020-08-25 DIAGNOSIS — I5032 Chronic diastolic (congestive) heart failure: Secondary | ICD-10-CM | POA: Diagnosis not present

## 2020-08-25 DIAGNOSIS — R778 Other specified abnormalities of plasma proteins: Secondary | ICD-10-CM | POA: Diagnosis not present

## 2020-08-25 LAB — COMPREHENSIVE METABOLIC PANEL
ALT: 16 U/L (ref 0–44)
AST: 28 U/L (ref 15–41)
Albumin: 2.8 g/dL — ABNORMAL LOW (ref 3.5–5.0)
Alkaline Phosphatase: 40 U/L (ref 38–126)
Anion gap: 7 (ref 5–15)
BUN: 20 mg/dL (ref 8–23)
CO2: 21 mmol/L — ABNORMAL LOW (ref 22–32)
Calcium: 8 mg/dL — ABNORMAL LOW (ref 8.9–10.3)
Chloride: 107 mmol/L (ref 98–111)
Creatinine, Ser: 0.67 mg/dL (ref 0.44–1.00)
GFR, Estimated: >60 mL/min (ref >60)
Glucose, Bld: 82 mg/dL (ref 70–99)
Potassium: 3.5 mmol/L (ref 3.5–5.1)
Sodium: 135 mmol/L (ref 135–145)
Total Bilirubin: 1 mg/dL (ref 0.3–1.2)
Total Protein: 5.7 g/dL — ABNORMAL LOW (ref 6.5–8.1)

## 2020-08-25 LAB — CBC
HCT: 33.3 % — ABNORMAL LOW (ref 36.0–46.0)
Hemoglobin: 10.6 g/dL — ABNORMAL LOW (ref 12.0–15.0)
MCH: 30.5 pg (ref 26.0–34.0)
MCHC: 31.8 g/dL (ref 30.0–36.0)
MCV: 96 fL (ref 80.0–100.0)
Platelets: 183 10*3/uL (ref 150–400)
RBC: 3.47 MIL/uL — ABNORMAL LOW (ref 3.87–5.11)
RDW: 13.5 % (ref 11.5–15.5)
WBC: 15.1 10*3/uL — ABNORMAL HIGH (ref 4.0–10.5)
nRBC: 0 % (ref 0.0–0.2)

## 2020-08-25 LAB — GLUCOSE, CAPILLARY
Glucose-Capillary: 84 mg/dL (ref 70–99)
Glucose-Capillary: 114 mg/dL — ABNORMAL HIGH (ref 70–99)
Glucose-Capillary: 132 mg/dL — ABNORMAL HIGH (ref 70–99)
Glucose-Capillary: 95 mg/dL (ref 70–99)

## 2020-08-25 LAB — SURGICAL PATHOLOGY

## 2020-08-25 NOTE — Progress Notes (Signed)
PROGRESS NOTE    Julie Graves  YIF:[id number] DOB: 12/24/1940 DOA: 08/22/2020 PCP: Harrison Mons, PA   Brief Narrative: Julie Graves is a 80 y.o. female with a history of hypertension, hyperlipidemia, non-ischemic cardiomyopathy, chronic diastolic heart failure, diabetes, aortic insufficiency, stable angina. Patient presented secondary to chest pain with evidence of sepsis and cholecystitis. Patient started on empiric antibiotics and underwent cholecystectomy.   Assessment & Plan:   Principal Problem:   Acute cholecystitis Active Problems:   Essential hypertension   Type II diabetes mellitus (HCC)   Chest pain   Mixed dyslipidemia   Elevated troponin   Acute on chronic diastolic CHF (congestive heart failure) ([place])   Sepsis Present on admission. Secondary to acute cholecystitis. Empiric Cefepime and Flagyl initiated on admission.  Acute cholecystitis General surgery consulted on admission. Patient underwent cholecystectomy on 3/2. No intraoperative cholangiogram performed secondary to concern for avulsion/significant injury. -General surgery recommendations: antibiotics for total of 7 days  Left sided chest pain Cardiology consulted. Associated elevated troponin with upward trend and peak of 192. Troponin from demand ischemia in setting of heart failure. Transthoracic Echocardiogram without wall motion abnormality. No further recommendations per cardiology. Outpatient follow-up.  Diabetes mellitus, type 2 Patient is on Jardiance and metformin as an outpatient. Hemoglobin A1C of 5.9% from 04/2020. -SSI while inpatient  Non-ischemic cardiomyopathy Acute on chronic diastolic heart failure Patient is on Coreg and losartan as an outpatient. Evidence of mild acute failure on admission in setting of cholecystitis now appears resolved. -Continue Coreg -Restart home Spironolactone  Hyperlipidemia On simvastatin as an outpatient which is held on  admission.  Primary hypertension Patient is on Coreg, spironolactone and losartan as an outpatient which were held secondary to NPO status. -Continue Coreg   DVT prophylaxis: Per general surgery post-op Code Status:   Code Status: Full Code Family Communication: None at bedside Disposition Plan: Discharge likely in 2-3 days pending general surgery recommendations, ambulation/possible PT recommendations   Consultants:   General surgery  Cardiology  Procedures:   LAPAROSCOPIC CHOLECYSTECTOMY (08/24/2020)  Antimicrobials:  Cefepime IV  Flagyl IV    Subjective: Some abdominal pain today. No other issues  Objective: Vitals:   08/25/20 0457 08/25/20 0652 08/25/20 0934 08/25/20 0937  BP:  (!) 103/49 (!) 121/42 (!) 126/55  Pulse:  60 69 66  Resp:      Temp:      TempSrc:      SpO2:    100%  Weight: 98.5 kg     Height:        Intake/Output Summary (Last 24 hours) at 08/25/2020 1223 Last data filed at 08/25/2020 8676 Gross per 24 hour  Intake 178.02 ml  Output 600 ml  Net -421.98 ml   Filed Weights   08/22/20 2322 08/24/20 0500 08/25/20 0457  Weight: 90.7 kg 95.1 kg 98.5 kg    Examination:  General exam: Appears calm and comfortable Respiratory system: Clear to auscultation. Respiratory effort normal. Cardiovascular system: S1 & S2 heard, RRR. No murmurs, rubs, gallops or clicks. Gastrointestinal system: Abdomen is nondistended, soft and mildly tender nontender. No organomegaly or masses felt. Normal bowel sounds heard. Central nervous system: Alert and oriented. No focal neurological deficits. Musculoskeletal: BLE edema. No calf tenderness Skin: No cyanosis. No rashes Psychiatry: Judgement and insight appear normal. Mood & affect appropriate.     Data Reviewed: I have personally reviewed following labs and imaging studies  CBC Lab Results  Component Value Date   WBC 15.1 (H) 08/25/2020  RBC 3.47 (L) 08/25/2020   HGB 10.6 (L) 08/25/2020   HCT 33.3 (L)  08/25/2020   MCV 96.0 08/25/2020   MCH 30.5 08/25/2020   PLT 183 08/25/2020   MCHC 31.8 08/25/2020   RDW 13.5 08/25/2020   LYMPHSABS 1.2 06/04/2019   MONOABS 0.5 06/04/2019   EOSABS 0.2 06/04/2019   BASOSABS 0.0 16/03/9603     Last metabolic panel Lab Results  Component Value Date   NA 135 08/25/2020   K 3.5 08/25/2020   CL 107 08/25/2020   CO2 21 (L) 08/25/2020   BUN 20 08/25/2020   CREATININE 0.67 08/25/2020   GLUCOSE 82 08/25/2020   GFRNONAA >60 08/25/2020   GFRAA 93 09/29/2019   CALCIUM 8.0 (L) 08/25/2020   PROT 5.7 (L) 08/25/2020   ALBUMIN 2.8 (L) 08/25/2020   BILITOT 1.0 08/25/2020   ALKPHOS 40 08/25/2020   AST 28 08/25/2020   ALT 16 08/25/2020   ANIONGAP 7 08/25/2020    CBG (last 3)  Recent Labs    08/24/20 2112 08/25/20 0718 08/25/20 1121  GLUCAP 82 84 114*     GFR: Estimated Creatinine Clearance: 60 mL/min (by C-G formula based on SCr of 0.67 mg/dL).  Coagulation Profile: No results for input(s): INR, PROTIME in the last 168 hours.  Recent Results (from the past 240 hour(s))  Resp Panel by RT-PCR (Flu A&B, Covid) Nasopharyngeal Swab     Status: None   Collection Time: 08/22/20 11:27 PM   Specimen: Nasopharyngeal Swab; Nasopharyngeal(NP) swabs in vial transport medium  Result Value Ref Range Status   SARS Coronavirus 2 by RT PCR NEGATIVE NEGATIVE Final    Comment: (NOTE) SARS-CoV-2 target nucleic acids are NOT DETECTED.  The SARS-CoV-2 RNA is generally detectable in upper respiratory specimens during the acute phase of infection. The lowest concentration of SARS-CoV-2 viral copies this assay can detect is 138 copies/mL. A negative result does not preclude SARS-Cov-2 infection and should not be used as the sole basis for treatment or other patient management decisions. A negative result may occur with  improper specimen collection/handling, submission of specimen other than nasopharyngeal swab, presence of viral mutation(s) within the areas  targeted by this assay, and inadequate number of viral copies(<138 copies/mL). A negative result must be combined with clinical observations, patient history, and epidemiological information. The expected result is Negative.  Fact Sheet for Patients:  EntrepreneurPulse.com.au  Fact Sheet for Healthcare Providers:  IncredibleEmployment.be  This test is no t yet approved or cleared by the [place] FDA and  has been authorized for detection and/or diagnosis of SARS-CoV-2 by FDA under an Emergency Use Authorization (EUA). This EUA will remain  in effect (meaning this test can be used) for the duration of the COVID-19 declaration under Section 564(b)(1) of the Act, 21 U.S.C.section 360bbb-3(b)(1), unless the authorization is terminated  or revoked sooner.       Influenza A by PCR NEGATIVE NEGATIVE Final   Influenza B by PCR NEGATIVE NEGATIVE Final    Comment: (NOTE) The Xpert Xpress SARS-CoV-2/FLU/RSV plus assay is intended as an aid in the diagnosis of influenza from Nasopharyngeal swab specimens and should not be used as a sole basis for treatment. Nasal washings and aspirates are unacceptable for Xpert Xpress SARS-CoV-2/FLU/RSV testing.  Fact Sheet for Patients: EntrepreneurPulse.com.au  Fact Sheet for Healthcare Providers: IncredibleEmployment.be  This test is not yet approved or cleared by the [place] FDA and has been authorized for detection and/or diagnosis of SARS-CoV-2 by FDA under an Emergency Use  Authorization (EUA). This EUA will remain in effect (meaning this test can be used) for the duration of the COVID-19 declaration under Section 564(b)(1) of the Act, 21 U.S.C. section 360bbb-3(b)(1), unless the authorization is terminated or revoked.  Performed at [facility], [place] [address], [place],  [address]         Radiology Studies: ECHOCARDIOGRAM  COMPLETE  Result Date: 08/23/2020    ECHOCARDIOGRAM REPORT   Patient Name:   PECOLIA MARANDO Date of Exam: 08/23/2020 Medical Rec #:  [id number]          Height:       60.0 in Accession #:    [id number]         Weight:       200.0 lb Date of Birth:  Apr 14, 1941          BSA:          1.866 m Patient Age:    37 years           BP:           101/56 mmHg Patient Gender: F                  HR:           84 bpm. Exam Location:  Inpatient Procedure: 2D Echo and Intracardiac Opacification Agent Indications:    Elevated Troponin  History:        Patient has prior history of Echocardiogram examinations, most                 recent 09/26/2019. CHF, CAD; Risk Factors:Hypertension, Diabetes                 and Dyslipidemia.  Sonographer:    Mikki Santee RDCS (AE) Referring Phys: [id number] [place] E SEGAL IMPRESSIONS  1. Left ventricular ejection fraction, by estimation, is 50 to 55%. The left ventricle has low normal function. The left ventricle has no regional wall motion abnormalities. There is mild left ventricular hypertrophy. Left ventricular diastolic parameters were normal.  2. Right ventricular systolic function is normal. The right ventricular size is normal.  3. Left atrial size was mildly dilated.  4. The mitral valve is normal in structure. Trivial mitral valve regurgitation. No evidence of mitral stenosis.  5. The aortic valve was not well visualized. Aortic valve regurgitation is mild. Mild aortic valve stenosis.  6. The inferior vena cava is normal in size with greater than 50% respiratory variability, suggesting right atrial pressure of 3 mmHg. FINDINGS  Left Ventricle: Left ventricular ejection fraction, by estimation, is 50 to 55%. The left ventricle has low normal function. The left ventricle has no regional wall motion abnormalities. Definity contrast agent was given IV to delineate the left ventricular endocardial borders. The left ventricular internal cavity size was normal in size. There is mild left  ventricular hypertrophy. Left ventricular diastolic parameters were normal. Right Ventricle: The right ventricular size is normal. No increase in right ventricular wall thickness. Right ventricular systolic function is normal. Left Atrium: Left atrial size was mildly dilated. Right Atrium: Right atrial size was normal in size. Pericardium: There is no evidence of pericardial effusion. Mitral Valve: The mitral valve is normal in structure. Trivial mitral valve regurgitation. No evidence of mitral valve stenosis. Tricuspid Valve: The tricuspid valve is normal in structure. Tricuspid valve regurgitation is not demonstrated. No evidence of tricuspid stenosis. Aortic Valve: The aortic valve was not well visualized. Aortic valve regurgitation is mild. Aortic  regurgitation PHT measures 283 msec. Mild aortic stenosis is present. Aortic valve mean gradient measures 12.5 mmHg. Aortic valve peak gradient measures 21.0 mmHg. Aortic valve area, by VTI measures 1.73 cm. Pulmonic Valve: The pulmonic valve was normal in structure. Pulmonic valve regurgitation is not visualized. No evidence of pulmonic stenosis. Aorta: The aortic root is normal in size and structure. Venous: The inferior vena cava is normal in size with greater than 50% respiratory variability, suggesting right atrial pressure of 3 mmHg. IAS/Shunts: No atrial level shunt detected by color flow Doppler.  LEFT VENTRICLE PLAX 2D LVIDd:         4.40 cm  Diastology LVIDs:         3.60 cm  LV e' medial:    5.55 cm/s LV PW:         1.10 cm  LV E/e' medial:  13.8 LV IVS:        1.30 cm  LV e' lateral:   7.18 cm/s LVOT diam:     2.10 cm  LV E/e' lateral: 10.7 LV SV:         78 LV SV Index:   42 LVOT Area:     3.46 cm  RIGHT VENTRICLE RV S prime:     8.05 cm/s TAPSE (M-mode): 2.2 cm LEFT ATRIUM             Index       RIGHT ATRIUM           Index LA diam:        4.00 cm 2.14 cm/m  RA Area:     12.30 cm LA Vol (A2C):   65.6 ml 35.15 ml/m RA Volume:   28.50 ml  15.27 ml/m  LA Vol (A4C):   48.1 ml 25.77 ml/m LA Biplane Vol: 57.2 ml 30.65 ml/m  AORTIC VALVE AV Area (Vmax):    1.74 cm AV Area (Vmean):   1.68 cm AV Area (VTI):     1.73 cm AV Vmax:           229.00 cm/s AV Vmean:          167.000 cm/s AV VTI:            0.452 m AV Peak Grad:      21.0 mmHg AV Mean Grad:      12.5 mmHg LVOT Vmax:         115.00 cm/s LVOT Vmean:        80.900 cm/s LVOT VTI:          0.226 m LVOT/AV VTI ratio: 0.50 AI PHT:            283 msec  AORTA Ao Root diam: 3.00 cm MITRAL VALVE MV Area (PHT): 2.29 cm     SHUNTS MV Decel Time: 331 msec     Systemic VTI:  0.23 m MV E velocity: 76.50 cm/s   Systemic Diam: 2.10 cm MV A velocity: 102.00 cm/s MV E/A ratio:  0.75 Jenkins Rouge MD Electronically signed by Jenkins Rouge MD Signature Date/Time: 08/23/2020/3:05:22 PM    Final         Scheduled Meds: . acetaminophen  650 mg Oral Q6H  . carvedilol  6.25 mg Oral BID  . insulin aspart  0-15 Units Subcutaneous TID WC  . lidocaine  1 patch Transdermal Q24H  . sodium chloride flush  3 mL Intravenous Q12H   Continuous Infusions: . ceFEPime (MAXIPIME) IV 2 g (08/25/20 0513)  . metronidazole 500 mg (08/25/20  5670)     LOS: 2 days     Cordelia Poche, MD [facility] 08/25/2020, 12:23 PM  If 7PM-7AM, please contact night-coverage [email or web address]

## 2020-08-25 NOTE — Progress Notes (Signed)
Patients BP 106/40 rechecked BP 103/49. Patient is alert with no distress noted. On-call E.Ouma NP notified via AMION and awaiting new orders. Will continue to monitor the patient.

## 2020-08-25 NOTE — Progress Notes (Signed)
[facility] Progress Note  1 Day Post-Op  Subjective: CC:  NAEO. Reports abdominal soreness but states her pain is improved compared to pre-op. Tolerating sips/ice, ordering a solid breakfast now. Voiding without sxs. Denies flatus or BM yet. Has not been OOB yet. At baseline mobilizes with an assistive device.  Objective: Vital signs in last 24 hours: Temp:  [97.5 F (36.4 C)-98.6 F (37 C)] 98 F (36.7 C) (03/03 0454) Pulse Rate:  [60-80] 60 (03/03 0652) Resp:  [13-19] 18 (03/03 0454) BP: (103-141)/(40-97) 103/49 (03/03 0652) SpO2:  [99 %-100 %] 100 % (03/03 0454) Weight:  [98.5 kg] 98.5 kg (03/03 0457)    Intake/Output from previous day: 03/02 0701 - 03/03 0700 In: 1091 [I.V.:991; IV Piggyback:100] Out: 650 [Urine:600; Blood:50] Intake/Output this shift: No intake/output data recorded.  PE: Gen:  Alert, NAD, pleasant Pulm:  Normal effort ORA Abd: Soft, obese, approp tender, incisions C/D/I Skin: warm and dry, no rashes  Psych: A&Ox3   Lab Results:  Recent Labs    08/24/20 0348 08/25/20 0340  WBC 19.7* 15.1*  HGB 12.3 10.6*  HCT 39.1 33.3*  PLT 208 183   BMET Recent Labs    08/24/20 0348 08/25/20 0340  NA 138 135  K 3.5 3.5  CL 109 107  CO2 18* 21*  GLUCOSE 95 82  BUN 14 20  CREATININE 0.62 0.67  CALCIUM 8.4* 8.0*   PT/INR No results for input(s): LABPROT, INR in the last 72 hours. CMP     Component Value Date/Time   NA 135 08/25/2020 0340   NA 140 09/29/2019 1305   K 3.5 08/25/2020 0340   CL 107 08/25/2020 0340   CO2 21 (L) 08/25/2020 0340   GLUCOSE 82 08/25/2020 0340   BUN 20 08/25/2020 0340   BUN 17 09/29/2019 1305   CREATININE 0.67 08/25/2020 0340   CREATININE 0.70 07/09/2014 1637   CALCIUM 8.0 (L) 08/25/2020 0340   PROT 5.7 (L) 08/25/2020 0340   ALBUMIN 2.8 (L) 08/25/2020 0340   AST 28 08/25/2020 0340   ALT 16 08/25/2020 0340   ALKPHOS 40 08/25/2020 0340   BILITOT 1.0 08/25/2020 0340   GFRNONAA >60 08/25/2020 0340    GFRAA 93 09/29/2019 1305   Lipase     Component Value Date/Time   LIPASE 18 08/24/2020 0348       Studies/Results: ECHOCARDIOGRAM COMPLETE  Result Date: 08/23/2020    ECHOCARDIOGRAM REPORT   Patient Name:   Julie Graves Date of Exam: 08/23/2020 Medical Rec #:  [id number]          Height:       60.0 in Accession #:    [id number]         Weight:       200.0 lb Date of Birth:  1940/08/11          BSA:          1.866 m Patient Age:    80 years           BP:           101/56 mmHg Patient Gender: F                  HR:           84 bpm. Exam Location:  Inpatient Procedure: 2D Echo and Intracardiac Opacification Agent Indications:    Elevated Troponin  History:        Patient has prior history of Echocardiogram examinations, most  recent 09/26/2019. CHF, CAD; Risk Factors:Hypertension, Diabetes                 and Dyslipidemia.  Sonographer:    Mikki Santee RDCS (AE) Referring Phys: [id number] [place] E SEGAL IMPRESSIONS  1. Left ventricular ejection fraction, by estimation, is 50 to 55%. The left ventricle has low normal function. The left ventricle has no regional wall motion abnormalities. There is mild left ventricular hypertrophy. Left ventricular diastolic parameters were normal.  2. Right ventricular systolic function is normal. The right ventricular size is normal.  3. Left atrial size was mildly dilated.  4. The mitral valve is normal in structure. Trivial mitral valve regurgitation. No evidence of mitral stenosis.  5. The aortic valve was not well visualized. Aortic valve regurgitation is mild. Mild aortic valve stenosis.  6. The inferior vena cava is normal in size with greater than 50% respiratory variability, suggesting right atrial pressure of 3 mmHg. FINDINGS  Left Ventricle: Left ventricular ejection fraction, by estimation, is 50 to 55%. The left ventricle has low normal function. The left ventricle has no regional wall motion abnormalities. Definity contrast agent was given IV  to delineate the left ventricular endocardial borders. The left ventricular internal cavity size was normal in size. There is mild left ventricular hypertrophy. Left ventricular diastolic parameters were normal. Right Ventricle: The right ventricular size is normal. No increase in right ventricular wall thickness. Right ventricular systolic function is normal. Left Atrium: Left atrial size was mildly dilated. Right Atrium: Right atrial size was normal in size. Pericardium: There is no evidence of pericardial effusion. Mitral Valve: The mitral valve is normal in structure. Trivial mitral valve regurgitation. No evidence of mitral valve stenosis. Tricuspid Valve: The tricuspid valve is normal in structure. Tricuspid valve regurgitation is not demonstrated. No evidence of tricuspid stenosis. Aortic Valve: The aortic valve was not well visualized. Aortic valve regurgitation is mild. Aortic regurgitation PHT measures 283 msec. Mild aortic stenosis is present. Aortic valve mean gradient measures 12.5 mmHg. Aortic valve peak gradient measures 21.0 mmHg. Aortic valve area, by VTI measures 1.73 cm. Pulmonic Valve: The pulmonic valve was normal in structure. Pulmonic valve regurgitation is not visualized. No evidence of pulmonic stenosis. Aorta: The aortic root is normal in size and structure. Venous: The inferior vena cava is normal in size with greater than 50% respiratory variability, suggesting right atrial pressure of 3 mmHg. IAS/Shunts: No atrial level shunt detected by color flow Doppler.  LEFT VENTRICLE PLAX 2D LVIDd:         4.40 cm  Diastology LVIDs:         3.60 cm  LV e' medial:    5.55 cm/s LV PW:         1.10 cm  LV E/e' medial:  13.8 LV IVS:        1.30 cm  LV e' lateral:   7.18 cm/s LVOT diam:     2.10 cm  LV E/e' lateral: 10.7 LV SV:         78 LV SV Index:   42 LVOT Area:     3.46 cm  RIGHT VENTRICLE RV S prime:     8.05 cm/s TAPSE (M-mode): 2.2 cm LEFT ATRIUM             Index       RIGHT ATRIUM            Index LA diam:        4.00 cm 2.14 cm/m  RA Area:     12.30 cm LA Vol (A2C):   65.6 ml 35.15 ml/m RA Volume:   28.50 ml  15.27 ml/m LA Vol (A4C):   48.1 ml 25.77 ml/m LA Biplane Vol: 57.2 ml 30.65 ml/m  AORTIC VALVE AV Area (Vmax):    1.74 cm AV Area (Vmean):   1.68 cm AV Area (VTI):     1.73 cm AV Vmax:           229.00 cm/s AV Vmean:          167.000 cm/s AV VTI:            0.452 m AV Peak Grad:      21.0 mmHg AV Mean Grad:      12.5 mmHg LVOT Vmax:         115.00 cm/s LVOT Vmean:        80.900 cm/s LVOT VTI:          0.226 m LVOT/AV VTI ratio: 0.50 AI PHT:            283 msec  AORTA Ao Root diam: 3.00 cm MITRAL VALVE MV Area (PHT): 2.29 cm     SHUNTS MV Decel Time: 331 msec     Systemic VTI:  0.23 m MV E velocity: 76.50 cm/s   Systemic Diam: 2.10 cm MV A velocity: 102.00 cm/s MV E/A ratio:  0.75 Jenkins Rouge MD Electronically signed by Jenkins Rouge MD Signature Date/Time: 08/23/2020/3:05:22 PM    Final     Anti-infectives: Anti-infectives (From admission, onward)   Start     Dose/Rate Route Frequency Ordered Stop   08/23/20 1400  metroNIDAZOLE (FLAGYL) IVPB 500 mg        500 mg 100 mL/hr over 60 Minutes Intravenous Every 8 hours 08/23/20 0818     08/23/20 1400  ceFEPIme (MAXIPIME) 2 g in sodium chloride 0.9 % 100 mL IVPB        2 g 200 mL/hr over 30 Minutes Intravenous Every 8 hours 08/23/20 1131     08/23/20 0645  ceFEPIme (MAXIPIME) 1 g in sodium chloride 0.9 % 100 mL IVPB  Status:  Discontinued       "And" Linked Group Details   1 g 200 mL/hr over 30 Minutes Intravenous  Once 08/23/20 4268 08/23/20 0639   08/23/20 0645  metroNIDAZOLE (FLAGYL) IVPB 500 mg  Status:  Discontinued       "And" Linked Group Details   500 mg 100 mL/hr over 60 Minutes Intravenous  Once 08/23/20 3419 08/23/20 0639   08/23/20 0645  ceFEPIme (MAXIPIME) 2 g in sodium chloride 0.9 % 100 mL IVPB       "And" Linked Group Details   2 g 200 mL/hr over 30 Minutes Intravenous  Once 08/23/20 0640 08/23/20 0719    08/23/20 0645  metroNIDAZOLE (FLAGYL) IVPB 500 mg       "And" Linked Group Details   500 mg 100 mL/hr over 60 Minutes Intravenous  Once 08/23/20 0640 08/23/20 0730     Assessment/Plan Nonischemic cardiomyopathy Chronic diastolic CHF Chronic stable angina Elevated troponin - cleared by cards Mild-moderate aortic insufficiency  Diabetes BMI 39 Hyperlipidemia  Abdominal pain/chest pain Acute gangrenous cholecystitis  POD#1 s/p laparoscopic cholecystectomy 08/24/20 Dr. Brantley Stage - AFVSS, LFTs WNL - mobilize today with PT/OT - monitor toleration of solid diet  - stable for discharge from a surgical perspective once tolerating solid food and medically cleared. Follow up provided. Continue abx for a total of 7 days  given gangrenous cholecystitis, switch to PO at discharge.     LOS: 2 days    Obie Dredge, [facility] Please see Amion for pager number during day hours 7:00am-4:30pm

## 2020-08-25 NOTE — Progress Notes (Signed)
Subjective:  Complains of abdominal discomfort.  Has not had a bowel movement yet.  Nausea has improved.  Pain only present when she moves.  No chest pain or dyspnea.  Intake/Output from previous day:  I/O last 3 completed shifts: In: 2755.7 [I.V.:2355.7; IV [id number]:678] Out: 9381 [Urine:1800; Blood:50] No intake/output data recorded.  Blood pressure (!) 103/49, pulse 60, temperature 98 F (36.7 C), resp. rate 18, height 5' (1.524 m), weight 98.5 kg, SpO2 100 %. Physical Exam Constitutional:      General: She is not in acute distress.    Appearance: She is obese.     Comments: Morbidly obese in no acute distress.  HENT:     Mouth/Throat:     Mouth: Mucous membranes are dry.  Cardiovascular:     Rate and Rhythm: Normal rate and regular rhythm.     Pulses:          Carotid pulses are 2+ on the right side and 2+ on the left side.      Dorsalis pedis pulses are 2+ on the right side and 2+ on the left side.       Posterior tibial pulses are 2+ on the right side and 2+ on the left side.     Heart sounds: Normal heart sounds. No murmur heard. No gallop.      Comments: Femoral and popliteal pulse difficult to feel due to patient's body habitus.  1+ pitting below knee right leg  And 2+ left. Left knee swollen. No signs of inflammation JVD difficult to see due to short neck. Pulmonary:     Effort: Pulmonary effort is normal.     Breath sounds: Normal breath sounds.  Abdominal:     General: Bowel sounds are normal.     Palpations: Abdomen is soft.     Tenderness: There is abdominal tenderness (diffuse and right hypochondrium). There is no guarding.     Comments: Obese. Pannus present  Musculoskeletal:        General: Swelling (left knee minimal swelling) present.     Cervical back: Normal range of motion.     Comments: Left knee post arthroplasty edema and tenderness. No signs of inflammation.   Skin:    General: Skin is warm and dry.  Neurological:     General: No focal deficit  present.     Mental Status: She is alert and oriented to person, place, and time.     Lab Results: BMP BNP (last 3 results) Recent Labs    09/29/19 1305  BNP 113.9*    ProBNP (last 3 results) No results for input(s): PROBNP in the last 8760 hours. BMP Latest Ref Rng & Units 08/25/2020 08/24/2020 08/23/2020  Glucose 70 - 99 mg/dL 82 95 89  BUN 8 - 23 mg/dL _0 Creatinine 0.44 - 1.00 mg/dL 0.67 0.62 0.56  BUN/Creat Ratio 12 - 28 - - -  Sodium 135 - 145 mmol/L 135 138 139  Potassium 3.5 - 5.1 mmol/L 3.5 3.5 3.7  Chloride 98 - 111 mmol/L 107 109 106  CO2 22 - 32 mmol/L 21(L) 18(L) 19(L)  Calcium 8.9 - 10.3 mg/dL 8.0(L) 8.4(L) 8.9   Hepatic Function Latest Ref Rng & Units 08/25/2020 08/24/2020 08/22/2020  Total Protein 6.5 - 8.1 g/dL 5.7(L) 6.1(L) 7.9  Albumin 3.5 - 5.0 g/dL 2.8(L) 3.0(L) 4.4  AST 15 - 41 U/L 28 14(L) 25  ALT 0 - 44 U/L _1 Alk Phosphatase 38 - 126 U/L 40  35(L) 38  Total Bilirubin 0.3 - 1.2 mg/dL 1.0 1.3(H) 1.5(H)  Bilirubin, Direct 0.0 - 0.2 mg/dL - - 0.4(H)   CBC Latest Ref Rng & Units 08/25/2020 08/24/2020 08/22/2020  WBC 4.0 - 10.5 K/uL 15.1(H) 19.7(H) 10.9(H)  Hemoglobin 12.0 - 15.0 g/dL 10.6(L) 12.3 12.9  Hematocrit 36.0 - 46.0 % 33.3(L) 39.1 40.1  Platelets 150 - 400 K/uL 183 208 260   Lipid Panel     Component Value Date/Time   CHOL 140 07/09/2014 1637   TRIG 243 (H) 07/09/2014 1637   HDL 55 07/09/2014 1637   CHOLHDL 2.5 07/09/2014 1637   VLDL 49 (H) 07/09/2014 1637   LDLCALC 36 07/09/2014 1637   Cardiac Panel (last 3 results) No results for input(s): CKTOTAL, CKMB, TROPONINI, RELINDX in the last 72 hours.  HEMOGLOBIN A1C Lab Results  Component Value Date   HGBA1C 5.9 (H) 05/13/2020   MPG 122.63 05/13/2020   TSH No results for input(s): TSH in the last 8760 hours.   Component Ref Range & Units 15:18 11:51 10:09 07:41 1 d ago 1 d ago 1 yr ago  Troponin I (High Sensitivity) <18 ng/           Imaging: ECHOCARDIOGRAM COMPLETE  Result  Date: 08/23/2020    ECHOCARDIOGRAM REPORT   Patient Name:   BAELEIGH DEVINCENT Date of Exam: 08/23/2020 Medical Rec #:  [id number]          Height:       60.0 in Accession #:    [id number]         Weight:       200.0 lb Date of Birth:  07-24-1940          BSA:          1.866 m Patient Age:    80 years           BP:           101/56 mmHg Patient Gender: F                  HR:           84 bpm. Exam Location:  Inpatient Procedure: 2D Echo and Intracardiac Opacification Agent Indications:    Elevated Troponin  History:        Patient has prior history of Echocardiogram examinations, most                 recent 09/26/2019. CHF, CAD; Risk Factors:Hypertension, Diabetes                 and Dyslipidemia.  Sonographer:    Mikki Santee RDCS (AE) Referring Phys: [id number] [place] E SEGAL IMPRESSIONS  1. Left ventricular ejection fraction, by estimation, is 50 to 55%. The left ventricle has low normal function. The left ventricle has no regional wall motion abnormalities. There is mild left ventricular hypertrophy. Left ventricular diastolic parameters were normal.  2. Right ventricular systolic function is normal. The right ventricular size is normal.  3. Left atrial size was mildly dilated.  4. The mitral valve is normal in structure. Trivial mitral valve regurgitation. No evidence of mitral stenosis.  5. The aortic valve was not well visualized. Aortic valve regurgitation is mild. Mild aortic valve stenosis.  6. The inferior vena cava is normal in size with greater than 50% respiratory variability, suggesting right atrial pressure of 3 mmHg. FINDINGS  Left Ventricle: Left ventricular ejection fraction, by estimation, is 50 to 55%. The left ventricle has low  normal function. The left ventricle has no regional wall motion abnormalities. Definity contrast agent was given IV to delineate the left ventricular endocardial borders. The left ventricular internal cavity size was normal in size. There is mild left ventricular hypertrophy.  Left ventricular diastolic parameters were normal. Right Ventricle: The right ventricular size is normal. No increase in right ventricular wall thickness. Right ventricular systolic function is normal. Left Atrium: Left atrial size was mildly dilated. Right Atrium: Right atrial size was normal in size. Pericardium: There is no evidence of pericardial effusion. Mitral Valve: The mitral valve is normal in structure. Trivial mitral valve regurgitation. No evidence of mitral valve stenosis. Tricuspid Valve: The tricuspid valve is normal in structure. Tricuspid valve regurgitation is not demonstrated. No evidence of tricuspid stenosis. Aortic Valve: The aortic valve was not well visualized. Aortic valve regurgitation is mild. Aortic regurgitation PHT measures 283 msec. Mild aortic stenosis is present. Aortic valve mean gradient measures 12.5 mmHg. Aortic valve peak gradient measures 21.0 mmHg. Aortic valve area, by VTI measures 1.73 cm. Pulmonic Valve: The pulmonic valve was normal in structure. Pulmonic valve regurgitation is not visualized. No evidence of pulmonic stenosis. Aorta: The aortic root is normal in size and structure. Venous: The inferior vena cava is normal in size with greater than 50% respiratory variability, suggesting right atrial pressure of 3 mmHg. IAS/Shunts: No atrial level shunt detected by color flow Doppler.  LEFT VENTRICLE PLAX 2D LVIDd:         4.40 cm  Diastology LVIDs:         3.60 cm  LV e' medial:    5.55 cm/s LV PW:         1.10 cm  LV E/e' medial:  13.8 LV IVS:        1.30 cm  LV e' lateral:   7.18 cm/s LVOT diam:     2.10 cm  LV E/e' lateral: 10.7 LV SV:         78 LV SV Index:   42 LVOT Area:     3.46 cm  RIGHT VENTRICLE RV S prime:     8.05 cm/s TAPSE (M-mode): 2.2 cm LEFT ATRIUM             Index       RIGHT ATRIUM           Index LA diam:        4.00 cm 2.14 cm/m  RA Area:     12.30 cm LA Vol (A2C):   65.6 ml 35.15 ml/m RA Volume:   28.50 ml  15.27 ml/m LA Vol (A4C):   48.1 ml  25.77 ml/m LA Biplane Vol: 57.2 ml 30.65 ml/m  AORTIC VALVE AV Area (Vmax):    1.74 cm AV Area (Vmean):   1.68 cm AV Area (VTI):     1.73 cm AV Vmax:           229.00 cm/s AV Vmean:          167.000 cm/s AV VTI:            0.452 m AV Peak Grad:      21.0 mmHg AV Mean Grad:      12.5 mmHg LVOT Vmax:         115.00 cm/s LVOT Vmean:        80.900 cm/s LVOT VTI:          0.226 m LVOT/AV VTI ratio: 0.50 AI PHT:  283 msec  AORTA Ao Root diam: 3.00 cm MITRAL VALVE MV Area (PHT): 2.29 cm     SHUNTS MV Decel Time: 331 msec     Systemic VTI:  0.23 m MV E velocity: 76.50 cm/s   Systemic Diam: 2.10 cm MV A velocity: 102.00 cm/s MV E/A ratio:  0.75 Jenkins Rouge MD Electronically signed by Jenkins Rouge MD Signature Date/Time: 08/23/2020/3:05:22 PM    Final     Cardiac Studies:  Stress nuclear study 11/05/2013: Exercise Capacity: Lexiscan with no exercise. BP Response: Normal blood pressure response. Clinical Symptoms: There is dyspnea. ECG Impression: No significant ECG changes with Lexiscan. Comparison with Prior Nuclear Study: No images to compare to, however the clinical repeat appears to be similar. Overall Impression: Low risk stress nuclear study With mild apical breast attenuation. Cannot exclude small apical infarct. LV Wall Motion: Unable to adequately it says the overall cardiac function as the gated images were not adequately established to capture wall motion.  Coronary angiogram 07/2017:mid circumflex 50%, D1 ostial 50% and mid 80%, very small. Mild noncritical CAD other vessels. EF 45%.  Echocardiogram 08/23/2020:    1. Left ventricular ejection fraction, by estimation, is 50 to 55%. The left ventricle has low normal function. The left ventricle has no regional wall motion abnormalities. There is mild left ventricular hypertrophy. Left ventricular diastolic  parameters were normal. 2. Right ventricular systolic function is normal. The right ventricular size is normal. 3.  Left atrial size was mildly dilated. 4. The mitral valve is normal in structure. Trivial mitral valve regurgitation. No evidence of mitral stenosis. 5. The aortic valve was not well visualized. Aortic valve regurgitation is mild. Mild aortic valve stenosis. 6. The inferior vena cava is normal in size with greater than 50% respiratory variability, suggesting right atrial pressure of 3 mmHg.   EKG  EKG 08/23/2020: Normal sinus rhythm with rate of 90 bpm, left axis deviation, left intrafascicular block, poor R wave progression, cannot exclude anteroseptal infarct old, marked ST-T abnormality, inferior and anterolateral ischemia versus subendocardial infarct.  PACs.  EKG 07/26/2020: Normal sinus rhythm with rate of 86 bpm, left axis deviation, left intrafascicular block.  IVCD, no significant change from 08/22/2020 LVH with repolarization, cannot exclude inferior and lateral   Scheduled Meds: . acetaminophen  650 mg Oral Q6H  . carvedilol  6.25 mg Oral BID  . insulin aspart  0-15 Units Subcutaneous TID WC  . lidocaine  1 patch Transdermal Q24H  . sodium chloride flush  3 mL Intravenous Q12H   Continuous Infusions: . ceFEPime (MAXIPIME) IV 2 g (08/25/20 0513)  . metronidazole 500 mg (08/25/20 0604)   PRN Meds:.HYDROmorphone (DILAUDID) injection, lip balm, ondansetron **OR** ondansetron (ZOFRAN) IV, oxyCODONE, polyethylene glycol  Assessment/Plan:  1.  Abnormal EKG 2.  Preoperative cardiac risk ratification SP laparoscopic cholecystectomy on 08/24/2020 without periprocedural complications 3.  Chronic diastolic heart failure 4.  Diabetes mellitus type 2 controlled without hypoglycemia  Recommendation: Patient is presently is well compensated with regard to heart failure.  Agree with primary team restarting Jardiance and along with her carvedilol and would restart losartan may be in the outpatient basis or if the blood pressure remained stable once she is tolerating carvedilol and Jardiance.   She has chronic leg edema and previously had done well on spironolactone as well.  Could restart spironolactone now which will help with heart failure as well.  Her oral intake is low hence we will watch blood pressure issues prior to starting losartan.  With  regard to mobilization, she has severe degenerative joint disease, needs physical therapy.  Otherwise stable from cardiac standpoint, please call if questions.  I have not placed any orders.   Adrian Prows, MD, [facility] 08/25/2020, 8:47 AM Office: [phone] Pager: [phone]

## 2020-08-25 NOTE — Evaluation (Signed)
Physical Therapy Evaluation Patient Details Name: Julie Graves MRN: [id number] DOB: 1941-02-08 Today's Date: 08/25/2020   History of Present Illness  80 y.o. female with a history of L TKA November 2021, hypertension, hyperlipidemia, non-ischemic cardiomyopathy, chronic diastolic heart failure, diabetes, aortic insufficiency, stable angina. Patient presented secondary to chest pain with evidence of sepsis and cholecystitis. Patient started on empiric antibiotics and underwent cholecystectomy 08/24/20.  Clinical Impression  Pt admitted with above diagnosis. Pt ambulated 6' with RW, distance limited by abdominal pain. Encouraged pt to ambulate to bathroom with assistance rather than using external catheter in order to increase activity level.  Pt currently with functional limitations due to the deficits listed below (see PT Problem List). Pt will benefit from skilled PT to increase their independence and safety with mobility to allow discharge to the venue listed below.       Follow Up Recommendations Home health PT    Equipment Recommendations  None recommended by PT    Recommendations for Other Services       Precautions / Restrictions Precautions Precautions: Fall Precaution Comments: abdominal surgery 08/24/20 Restrictions Weight Bearing Restrictions: No      Mobility  Bed Mobility Overal bed mobility: Needs Assistance Bed Mobility: Rolling;Sidelying to Sit Rolling: Min assist Sidelying to sit: Min assist       General bed mobility comments: instructed pt in log roll technique    Transfers Overall transfer level: Needs assistance Equipment used: Rolling walker (2 wheeled) Transfers: Sit to/from Stand Sit to Stand: Min assist         General transfer comment: VCs hand placement, min A to power up  Ambulation/Gait Ambulation/Gait assistance: Supervision Gait Distance (Feet): 60 Feet Assistive device: Rolling walker (2 wheeled) Gait Pattern/deviations:  Step-through pattern;Decreased stride length Gait velocity: decr   General Gait Details: steady with RW, distance limited by abdominal pain  Stairs            Wheelchair Mobility    Modified Rankin (Stroke Patients Only)       Balance Overall balance assessment: Modified Independent                                           Pertinent Vitals/Pain Pain Assessment: 0-10 Pain Score: 8  Pain Location: abdomen, esp RLQ Pain Descriptors / Indicators: Sore Pain Intervention(s): Limited activity within patient's tolerance;Monitored during session;Premedicated before session;Repositioned    Home Living Family/patient expects to be discharged to:: Private residence Living Arrangements: Spouse/significant other;Children Available Help at Discharge: Family;Available 24 hours/day Type of Home: House Home Access: Stairs to enter   [employer] of Steps: 1 Home Layout: One level Home Equipment: Walker - 2 wheels;Cane - single point      Prior Function Level of Independence: Independent with assistive device(s)         Comments: walks with SPC     Hand Dominance        Extremity/Trunk Assessment   Upper Extremity Assessment Upper Extremity Assessment: Overall WFL for tasks assessed    Lower Extremity Assessment Lower Extremity Assessment: Overall WFL for tasks assessed    Cervical / Trunk Assessment Cervical / Trunk Assessment: Normal  Communication   Communication: No difficulties  Cognition Arousal/Alertness: Awake/alert Behavior During Therapy: WFL for tasks assessed/performed Overall Cognitive Status: Within Functional Limits for tasks assessed  General Comments      Exercises General Exercises - Lower Extremity Long Arc Quad: AROM;Left;10 reps;Seated   Assessment/Plan    PT Assessment Patient needs continued PT services  PT Problem List Decreased  mobility;Decreased activity tolerance       PT Treatment Interventions Therapeutic activities;Therapeutic exercise;Gait training;Functional mobility training;Patient/family education    PT Goals (Current goals can be found in the Care Plan section)  Acute Rehab PT Goals Patient Stated Goal: to walk farther PT Goal Formulation: With patient Time For Goal Achievement: 09/08/20 Potential to Achieve Goals: Good    Frequency Min 3X/week   Barriers to discharge        Co-evaluation               AM-PAC PT "6 Clicks" Mobility  Outcome Measure Help needed turning from your back to your side while in a flat bed without using bedrails?: A Little Help needed moving from lying on your back to sitting on the side of a flat bed without using bedrails?: A Little Help needed moving to and from a bed to a chair (including a wheelchair)?: A Little Help needed standing up from a chair using your arms (e.g., wheelchair or bedside chair)?: A Little Help needed to walk in hospital room?: None Help needed climbing 3-5 steps with a railing? : A Little 6 Click Score: 19    End of Session Equipment Utilized During Treatment: Gait belt Activity Tolerance: Patient tolerated treatment well;Patient limited by pain Patient left: in chair;with call bell/phone within reach (Placed chair alarm pad under pt, but no chair alarm box in room, notified NT.) Nurse Communication: Mobility status PT Visit Diagnosis: Difficulty in walking, not elsewhere classified (R26.2);Pain    Time: 1359-1426 PT Time Calculation (min) (ACUTE ONLY): 27 min   Charges:   PT Evaluation $PT Eval Low Complexity: 1 Low PT Treatments $Gait Training: 8-22 mins        Blondell Reveal Kistler PT 08/25/2020  Acute Rehabilitation Services Pager [phone] Office [phone]

## 2020-08-26 DIAGNOSIS — K810 Acute cholecystitis: Secondary | ICD-10-CM | POA: Diagnosis not present

## 2020-08-26 LAB — CBC
HCT: 33.4 % — ABNORMAL LOW (ref 36.0–46.0)
Hemoglobin: 10.7 g/dL — ABNORMAL LOW (ref 12.0–15.0)
MCH: 30.2 pg (ref 26.0–34.0)
MCHC: 32 g/dL (ref 30.0–36.0)
MCV: 94.4 fL (ref 80.0–100.0)
Platelets: 225 10*3/uL (ref 150–400)
RBC: 3.54 MIL/uL — ABNORMAL LOW (ref 3.87–5.11)
RDW: 13.2 % (ref 11.5–15.5)
WBC: 12.1 10*3/uL — ABNORMAL HIGH (ref 4.0–10.5)
nRBC: 0 % (ref 0.0–0.2)

## 2020-08-26 LAB — GLUCOSE, CAPILLARY
Glucose-Capillary: 98 mg/dL (ref 70–99)
Glucose-Capillary: 84 mg/dL (ref 70–99)

## 2020-08-26 MED ORDER — OXYCODONE HCL 5 MG PO TABS: 5.0000 mg | ORAL_TABLET | Freq: Four times a day (QID) | ORAL | 0 refills | Status: DC | PRN

## 2020-08-26 MED ORDER — ACETAMINOPHEN 325 MG PO TABS: 650.0000 mg | ORAL_TABLET | Freq: Four times a day (QID) | ORAL | Status: AC | PRN

## 2020-08-26 MED ORDER — CIPROFLOXACIN HCL 500 MG PO TABS: 500.0000 mg | ORAL_TABLET | Freq: Two times a day (BID) | ORAL | 0 refills | Status: AC

## 2020-08-26 MED ORDER — METRONIDAZOLE 500 MG PO TABS: 500.0000 mg | ORAL_TABLET | Freq: Three times a day (TID) | ORAL | 0 refills | Status: AC

## 2020-08-26 NOTE — Evaluation (Signed)
Occupational Therapy Evaluation Patient Details Name: Julie Graves MRN: [id number] DOB: 1940-09-21 Today's Date: 08/26/2020    History of Present Illness 80 y.o. female with a history of L TKA November 2021, hypertension, hyperlipidemia, non-ischemic cardiomyopathy, chronic diastolic heart failure, diabetes, aortic insufficiency, stable angina. Patient presented secondary to chest pain with evidence of sepsis and cholecystitis. Patient started on empiric antibiotics and underwent cholecystectomy 08/24/20.   Clinical Impression   Patient lives at home with spouse, daughter and son in law, home is two levels but patient stays on main floor. Patient reports she has progressed with outpatient PT was no longer using walker, just cane "when I was in a hurry to the bathroom," and is independent with self care tasks. Currently patient needing mod to max A for lower body ADLs, max A for perianal care and min G assist for toilet transfer due to abdominal pain and decreased activity tolerance. Educated patient on adaptive equipment such as toileting aid and reacher, patient states she is familiar with reacher and has one at home to assist with dressing as needed. Recommend continued acute OT services to maximize patient safety and independence with self care in order to facilitate D/C to venue listed below.    Follow Up Recommendations  Supervision/Assistance - 24 hour;Other (comment) (pt expresses did not like HH in past, wants to continue with OP PT)    Equipment Recommendations  Other (comment) (toileting aid)       Precautions / Restrictions Precautions Precautions: Fall Precaution Comments: abdominal surgery 08/24/20 Restrictions Weight Bearing Restrictions: No      Mobility Bed Mobility Overal bed mobility: Needs Assistance Bed Mobility: Sit to Sidelying         Sit to sidelying: Mod assist;HOB elevated General bed mobility comments: mod A to lift LEs    Transfers Overall transfer  level: Needs assistance Equipment used: Rolling walker (2 wheeled) Transfers: Sit to/from Stand Sit to Stand: Min guard         General transfer comment: min G for safety    Balance Overall balance assessment: Mild deficits observed, not formally tested                                         ADL either performed or assessed with clinical judgement   ADL Overall ADL's : Needs assistance/impaired     Grooming: Oral care;Wash/dry face;Wash/dry hands;Supervision/safety;Standing   Upper Body Bathing: Set up;Sitting   Lower Body Bathing: Moderate assistance;Sitting/lateral leans;Sit to/from stand Lower Body Bathing Details (indicate cue type and reason): increased pain with flexion to reach lower legs Upper Body Dressing : Set up;Sitting   Lower Body Dressing: Maximal assistance;Sitting/lateral leans Lower Body Dressing Details (indicate cue type and reason): unable to reach feet without increased abdominal pain to doff sock, pt reports has reacher at home to assist "they taught me how in rehab" Toilet Transfer: Min guard;Ambulation;RW Toilet Transfer Details (indicate cue type and reason): min G for safety Toileting- Clothing Manipulation and Hygiene: Maximal assistance;Sit to/from stand Toileting - Clothing Manipulation Details (indicate cue type and reason): patient states reaching back hurts abdomen and "I want to make sure I'm clean" pt also states someone at home can assist with perianal care if needed. educate patient on use of toileting aids     Functional mobility during ADLs: Min guard;Rolling walker General ADL Comments: patient requiring increased assistance with self care due to  abdominal pain limiting her activity tolerance                  Pertinent Vitals/Pain Pain Assessment: Faces Faces Pain Scale: Hurts little more Pain Location: abdomen, esp RLQ Pain Descriptors / Indicators: Sore;Grimacing Pain Intervention(s): Monitored during  session     Hand Dominance Right   Extremity/Trunk Assessment Upper Extremity Assessment Upper Extremity Assessment: Overall WFL for tasks assessed   Lower Extremity Assessment Lower Extremity Assessment: Defer to PT evaluation       Communication Communication Communication: No difficulties   Cognition Arousal/Alertness: Awake/alert Behavior During Therapy: WFL for tasks assessed/performed Overall Cognitive Status: Within Functional Limits for tasks assessed                                                Home Living Family/patient expects to be discharged to:: Private residence Living Arrangements: Spouse/significant other;Children Available Help at Discharge: Family;Available 24 hours/day Type of Home: House Home Access: Stairs to enter [employer] of Steps: 1   Home Layout: Two level;Able to live on main level with bedroom/bathroom     Bathroom Shower/Tub: Walk-in shower   Bathroom Toilet: Handicapped height     Home Equipment: Environmental consultant - 2 wheels;Cane - single point;Shower seat;Adaptive equipment Adaptive Equipment: Reacher        Prior Functioning/Environment Level of Independence: Independent with assistive device(s)        Comments: walks with SPC        OT Problem List: Decreased activity tolerance;Impaired balance (sitting and/or standing);Decreased safety awareness;Decreased knowledge of use of DME or AE;Pain;Obesity      OT Treatment/Interventions: Self-care/ADL training;DME and/or AE instruction;Therapeutic activities;Patient/family education;Balance training    OT Goals(Current goals can be found in the care plan section) Acute Rehab OT Goals Patient Stated Goal: home today OT Goal Formulation: With patient Time For Goal Achievement: 09/09/20 Potential to Achieve Goals: Good  OT Frequency: Min 2X/week              AM-PAC OT "6 Clicks" Daily Activity     Outcome Measure Help from another person eating  meals?: None Help from another person taking care of personal grooming?: A Little Help from another person toileting, which includes using toliet, bedpan, or urinal?: A Lot Help from another person bathing (including washing, rinsing, drying)?: A Lot Help from another person to put on and taking off regular upper body clothing?: A Little Help from another person to put on and taking off regular lower body clothing?: A Lot 6 Click Score: 16   End of Session Equipment Utilized During Treatment: Rolling walker Nurse Communication: Mobility status  Activity Tolerance: Patient tolerated treatment well Patient left: in bed;with call bell/phone within reach  OT Visit Diagnosis: Other abnormalities of gait and mobility (R26.89);Pain Pain - Right/Left: Right Pain - part of body:  (abdomen)                Time: 0950-1010 OT Time Calculation (min): 20 min Charges:  OT General Charges $OT Visit: 1 Visit OT Evaluation $OT Eval Low Complexity: [place] OT OT pager: [place] 08/26/2020, 10:38 AM

## 2020-08-26 NOTE — TOC Initial Note (Signed)
Transition of Care [facility]) - Initial/Assessment Note    Patient Details  Name: Julie Graves MRN: [id number] Date of Birth: 24-Sep-1940  Transition of Care [facility]) CM/SW Contact:    Trish Mage, LCSW Phone Number: 08/26/2020, 8:44 AM  Clinical Narrative:    Patient seen in follow up to PT recommendation of [facility].  Ms Foronda lives here in [place] with spouse and daughter, who recently joined them to help out as patient's husband also recently had surgery.  She states she is already set up with outpatient rehab on [address] and would prefer to travel there for help.  She states she has all needed DME at home.  No further needs identifed. TOC will continue to follow during the course of hospitalization.                Expected Discharge Plan: Home/Self Care Barriers to Discharge: No Barriers Identified   Patient Goals and CMS Choice     Choice offered to / list presented to : Patient  Expected Discharge Plan and Services Expected Discharge Plan: Home/Self Care   Discharge Planning Services: CM Consult   Living arrangements for the past 2 months: Single Family Home                                      Prior Living Arrangements/Services Living arrangements for the past 2 months: Single Family Home Lives with:: [place] Patient language and need for interpreter reviewed:: Yes Do you feel safe going back to the place where you live?: Yes      Need for Family Participation in Patient Care: Yes (Comment) Care giver support system in place?: Yes (comment) Current home services: DME Criminal Activity/Legal Involvement Pertinent to Current Situation/Hospitalization: No - Comment as needed  Activities of Daily Living Home Assistive Devices/Equipment: Eyeglasses,Cane (specify quad or straight) (single point cane) ADL Screening (condition at time of admission) Patient's cognitive ability adequate to safely complete daily activities?: Yes Is the patient deaf or have  difficulty hearing?: No Does the patient have difficulty seeing, even when wearing glasses/contacts?: No Does the patient have difficulty concentrating, remembering, or making decisions?: No Patient able to express need for assistance with ADLs?: Yes Does the patient have difficulty dressing or bathing?: No Independently performs ADLs?: No Communication: Independent Dressing (OT): Independent Grooming: Independent Feeding: Independent Bathing: Independent Toileting: Independent with device (comment) In/Out Bed: Independent Walks in Home: Independent with device (comment) Does the patient have difficulty walking or climbing stairs?: No Weakness of Legs: Both Weakness of Arms/Hands: None  Permission Sought/Granted                  Emotional Assessment Appearance:: Appears stated age Attitude/Demeanor/Rapport: Engaged Affect (typically observed): Appropriate Orientation: : Oriented to Self,Oriented to Place,Oriented to  Time,Oriented to Situation Alcohol / Substance Use: Not Applicable Psych Involvement: No (comment)  Admission diagnosis:  Acute cholecystitis [K81.0] Acalculous cholecystitis [K81.9] Thyroid nodule [E04.1] Elevated LFTs [R79.89] Elevated troponin I level [R77.8] Patient Active Problem List   Diagnosis Date Noted  . Acute on chronic diastolic CHF (congestive heart failure) ([place]) 08/24/2020  . Acute cholecystitis 08/23/2020  . Elevated troponin 08/23/2020  . Arthritis of left knee 05/13/2020  . S/P total knee arthroplasty, left 05/13/2020  . History of total knee arthroplasty, right 03/31/2020  . Precordial pain   . Chest pain 08/13/2017  . Mixed dyslipidemia 08/13/2017  . CAD (  coronary artery disease), native coronary artery 08/13/2017  . Mild episode of recurrent major depressive disorder ([place]) 11/03/2013  . Urge incontinence 04/02/2013  . Spinal stenosis, lumbar region, without neurogenic claudication 10/27/2012  . Spinal stenosis of lumbar region  10/01/2012  . Allergic rhinitis 09/26/2011  . Dizziness 01/23/2011  . Dyspnea 01/23/2011  . Type II diabetes mellitus ([place]) 06/27/2010  . Morbid obesity ([place]) 06/27/2010  . Impaired glucose tolerance 06/27/2010  . Disorder of bone and cartilage 06/22/2010  . Primary cardiomyopathy ([place]) 03/23/2010  . HYPERLIPIDEMIA-MIXED 03/07/2010  . Essential hypertension 03/07/2010  . CARDIOMEGALY 11/30/2009  . OTHER DYSPNEA AND RESPIRATORY ABNORMALITIES 11/30/2009  . AORTIC REGURGITATION 09/28/2009  . Chronic diastolic heart failure ([place]) 09/28/2009  . Aortic regurgitation 09/28/2009  . 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 Osteoarthrosis involving multiple sites 10/26/2008   PCP:  Harrison Mons, [place] [facility]:   [facility] [address] [place] - [place] [address] [place] [address] Phone: [phone] Fax: [phone]     Social Determinants of Health (SDOH) Interventions    Readmission Risk Interventions No flowsheet data found.

## 2020-08-26 NOTE — Discharge Summary (Signed)
Physician Discharge Summary  Julie Graves YNW:[id number] DOB: 03-30-1941 DOA: 08/22/2020  PCP: Harrison Mons, PA  Admit date: 08/22/2020 Discharge date: 08/26/2020  Admitted From: Home Disposition: Home  Recommendations for Outpatient Follow-up:  1. Follow up with PCP in 1 week 2. Follow up with General surgery 3. Please obtain BMP/CBC in one week 4. Complete 7 day total course of antibiotics 5. Please follow up on the following pending results: None  Home Health: None Equipment/Devices: None  Discharge Condition: Stable CODE STATUS: Full code Diet recommendation: Heart healthy diet   Brief/Interim Summary:  Admission HPI written by Vianne Bulls, MD   HPI:  This is a 80 year old female with past medical history of hypertension, hyperlipidemia, NICM, chronic diastolic CHF, diabetes, mild-mod aortic insufficiency, chronic stable angina, appendectomy, hysterectomy who presented with upper abdominal pain and left-sided chest pain which started 2/27 overnight and persisted throughout the night prompting her to come to the ED.  Associated with left chest, back pain and vomiting. Currently feels very sore in her upper abdomen and without any chest pain or palpitations currently.    Hospital course:  Sepsis Present on admission. Secondary to acute cholecystitis. Empiric Cefepime and Flagyl initiated on admission.  Acute cholecystitis General surgery consulted on admission. Patient underwent cholecystectomy on 3/2. No intraoperative cholangiogram performed secondary to concern for avulsion/significant injury. General surgery recommending for a total of 7 days of antibiotics secondary to gangrenous gallbladder and outpatient follow-up.  Left sided chest pain Cardiology consulted. Associated elevated troponin with upward trend and peak of 192. Troponin from demand ischemia in setting of heart failure. Transthoracic Echocardiogram without wall motion abnormality. No  further recommendations per cardiology. Outpatient follow-up.  Diabetes mellitus, type 2 Patient is on Jardiance and metformin as an outpatient. Hemoglobin A1C of 5.9% from 04/2020. Continue regimen on discharge.  Non-ischemic cardiomyopathy Acute on chronic diastolic heart failure Patient is on Coreg, spironolactone and losartan as an outpatient. Evidence of mild acute failure on admission in setting of cholecystitis now appears resolved. Resume home regimen.  Hyperlipidemia On simvastatin as an outpatient which is held on admission.  Primary hypertension Patient is on Coreg, spironolactone and losartan as an outpatient which were held secondary to NPO status. Continue outpatient regimen  Discharge Diagnoses:  Principal Problem:   Acute cholecystitis Active Problems:   Essential hypertension   Type II diabetes mellitus (HCC)   Chest pain   Mixed dyslipidemia   Elevated troponin   Acute on chronic diastolic CHF (congestive heart failure) (HCC)    Discharge Instructions   Allergies as of 08/26/2020      Reactions   Iodine Hives, Swelling   Shellfish Allergy Swelling, Other (See Comments)   Swelling - throat and lips   Topiramate    Other reaction(s): Chest Pain   Metamucil [psyllium]    Lisinopril Other (See Comments)   cough   Penicillins Hives, Rash, Other (See Comments)   Has patient had a PCN reaction causing immediate rash, facial/tongue/throat swelling, SOB or lightheadedness with hypotension: Unknown Has patient had a PCN reaction causing severe rash involving mucus membranes or skin necrosis: No Has patient had a PCN reaction that required hospitalization: No Has patient had a PCN reaction occurring within the last 10 years: No If all of the above answers are "NO", then may proceed with Cephalosporin use.   Tramadol Other (See Comments)   confusion      Medication List    STOP taking these medications   HYDROcodone-acetaminophen 5-325  MG tablet Commonly  known as: NORCO/VICODIN   oxyCODONE-acetaminophen 5-325 MG tablet Commonly known as: PERCOCET/ROXICET     TAKE these medications   acetaminophen 325 MG tablet Commonly known as: TYLENOL Take 2 tablets (650 mg total) by mouth every 6 (six) hours as needed.   alendronate 70 MG tablet Commonly known as: FOSAMAX Take 70 mg by mouth every Monday. Take with a full glass of water on an empty stomach. Take on mondays   aspirin EC 81 MG tablet Take 81 mg by mouth daily. Swallow whole.   CALCIUM 600/VITAMIN D3 PO Take 1 tablet by mouth daily.   carvedilol 6.25 MG tablet Commonly known as: COREG Take 6.25 mg by mouth 2 (two) times daily.   ciprofloxacin 500 MG tablet Commonly known as: Cipro Take 1 tablet (500 mg total) by mouth 2 (two) times daily for 3 days.   diazepam 2 MG tablet Commonly known as: VALIUM Take 1 mg by mouth daily as needed for anxiety.   EPINEPHrine 0.3 mg/0.3 mL Soaj injection Commonly known as: EPI-PEN Inject 0.3 mLs (0.3 mg total) into the muscle as needed for anaphylaxis.   Jardiance 10 MG Tabs tablet Generic drug: empagliflozin TAKE 1 TABLET BY MOUTH DAILY BEFORE BREAKFAST What changed:   how much to take  when to take this   losartan 100 MG tablet Commonly known as: COZAAR Take 1 tablet (100 mg total) by mouth daily.   metFORMIN 500 MG 24 hr tablet Commonly known as: GLUCOPHAGE-XR Take 500 mg by mouth daily.   metroNIDAZOLE 500 MG tablet Commonly known as: Flagyl Take 1 tablet (500 mg total) by mouth 3 (three) times daily for 3 days.   nitroGLYCERIN 0.4 MG SL tablet Commonly known as: NITROSTAT Place 0.4 mg under the tongue every 5 (five) minutes as needed for chest pain.   oxyCODONE 5 MG immediate release tablet Commonly known as: Oxy IR/ROXICODONE Take 1 tablet (5 mg total) by mouth every 6 (six) hours as needed for moderate pain, severe pain or breakthrough pain (pain not releieved by tylenol).   simvastatin 40 MG tablet Commonly  known as: ZOCOR Take 40 mg by mouth at bedtime.   spironolactone 25 MG tablet Commonly known as: ALDACTONE TAKE 1 TABLET(25 MG) BY MOUTH DAILY What changed: See the new instructions.   VITAMIN C PO Take 1 tablet by mouth daily.   Vitamin D 50 MCG (2000 UT) Caps Take 2,000 Units by mouth daily.       Follow-up Information    [facility] Go on 09/06/2020.   Why:  at 11:45 for post-operative follow up. please arrive 30 minutes early. Contact information: [address] [place] [address]       Harrison Mons, [place]. Schedule an appointment as soon as possible for a visit in 1 week(s).   Specialty: Family Medicine Why: Hospital follow-up Contact information: [place] [address] [address] [phone]              Allergies  Allergen Reactions  . Iodine Hives and Swelling  . Shellfish Allergy Swelling and Other (See Comments)    Swelling - throat and lips  . Topiramate     Other reaction(s): Chest Pain  . Metamucil [Psyllium]   . Lisinopril Other (See Comments)    cough  . Penicillins Hives, Rash and Other (See Comments)    Has patient had a PCN reaction causing immediate rash, facial/tongue/throat swelling, SOB or  lightheadedness with hypotension: Unknown Has patient had a PCN reaction causing severe rash involving mucus membranes or skin necrosis: No Has patient had a PCN reaction that required hospitalization: No Has patient had a PCN reaction occurring within the last 10 years: No If all of the above answers are "NO", then may proceed with Cephalosporin use.   . Tramadol Other (See Comments)    confusion    Consultations:  General surgery  Cardiology   Procedures/Studies: DG Chest 2 View  Result Date: 08/22/2020 CLINICAL DATA:  Shortness of breath. EXAM: CHEST - 2 VIEW COMPARISON:  08/13/2017 FINDINGS: The heart size is stable but enlarged. Aortic calcifications are  noted. There is no pneumothorax or large pleural effusion. No focal infiltrate. No acute osseous abnormality. IMPRESSION: No active cardiopulmonary disease. Electronically Signed   By: Constance Holster M.D.   On: 08/22/2020 19:49   ECHOCARDIOGRAM COMPLETE  Result Date: 08/23/2020    ECHOCARDIOGRAM REPORT   Patient Name:   SHEVETTE BESS Date of Exam: 08/23/2020 Medical Rec #:  [id number]          Height:       60.0 in Accession #:    [id number]         Weight:       200.0 lb Date of Birth:  1940-11-28          BSA:          1.866 m Patient Age:    48 years           BP:           101/56 mmHg Patient Gender: F                  HR:           84 bpm. Exam Location:  Inpatient Procedure: 2D Echo and Intracardiac Opacification Agent Indications:    Elevated Troponin  History:        Patient has prior history of Echocardiogram examinations, most                 recent 09/26/2019. CHF, CAD; Risk Factors:Hypertension, Diabetes                 and Dyslipidemia.  Sonographer:    Mikki Santee RDCS (AE) Referring Phys: [id number] [place] E SEGAL IMPRESSIONS  1. Left ventricular ejection fraction, by estimation, is 50 to 55%. The left ventricle has low normal function. The left ventricle has no regional wall motion abnormalities. There is mild left ventricular hypertrophy. Left ventricular diastolic parameters were normal.  2. Right ventricular systolic function is normal. The right ventricular size is normal.  3. Left atrial size was mildly dilated.  4. The mitral valve is normal in structure. Trivial mitral valve regurgitation. No evidence of mitral stenosis.  5. The aortic valve was not well visualized. Aortic valve regurgitation is mild. Mild aortic valve stenosis.  6. The inferior vena cava is normal in size with greater than 50% respiratory variability, suggesting right atrial pressure of 3 mmHg. FINDINGS  Left Ventricle: Left ventricular ejection fraction, by estimation, is 50 to 55%. The left ventricle has low normal  function. The left ventricle has no regional wall motion abnormalities. Definity contrast agent was given IV to delineate the left ventricular endocardial borders. The left ventricular internal cavity size was normal in size. There is mild left ventricular hypertrophy. Left ventricular diastolic parameters were normal. Right Ventricle: The right ventricular size is normal. No increase in right  ventricular wall thickness. Right ventricular systolic function is normal. Left Atrium: Left atrial size was mildly dilated. Right Atrium: Right atrial size was normal in size. Pericardium: There is no evidence of pericardial effusion. Mitral Valve: The mitral valve is normal in structure. Trivial mitral valve regurgitation. No evidence of mitral valve stenosis. Tricuspid Valve: The tricuspid valve is normal in structure. Tricuspid valve regurgitation is not demonstrated. No evidence of tricuspid stenosis. Aortic Valve: The aortic valve was not well visualized. Aortic valve regurgitation is mild. Aortic regurgitation PHT measures 283 msec. Mild aortic stenosis is present. Aortic valve mean gradient measures 12.5 mmHg. Aortic valve peak gradient measures 21.0 mmHg. Aortic valve area, by VTI measures 1.73 cm. Pulmonic Valve: The pulmonic valve was normal in structure. Pulmonic valve regurgitation is not visualized. No evidence of pulmonic stenosis. Aorta: The aortic root is normal in size and structure. Venous: The inferior vena cava is normal in size with greater than 50% respiratory variability, suggesting right atrial pressure of 3 mmHg. IAS/Shunts: No atrial level shunt detected by color flow Doppler.  LEFT VENTRICLE PLAX 2D LVIDd:         4.40 cm  Diastology LVIDs:         3.60 cm  LV e' medial:    5.55 cm/s LV PW:         1.10 cm  LV E/e' medial:  13.8 LV IVS:        1.30 cm  LV e' lateral:   7.18 cm/s LVOT diam:     2.10 cm  LV E/e' lateral: 10.7 LV SV:         78 LV SV Index:   42 LVOT Area:     3.46 cm  RIGHT  VENTRICLE RV S prime:     8.05 cm/s TAPSE (M-mode): 2.2 cm LEFT ATRIUM             Index       RIGHT ATRIUM           Index LA diam:        4.00 cm 2.14 cm/m  RA Area:     12.30 cm LA Vol (A2C):   65.6 ml 35.15 ml/m RA Volume:   28.50 ml  15.27 ml/m LA Vol (A4C):   48.1 ml 25.77 ml/m LA Biplane Vol: 57.2 ml 30.65 ml/m  AORTIC VALVE AV Area (Vmax):    1.74 cm AV Area (Vmean):   1.68 cm AV Area (VTI):     1.73 cm AV Vmax:           229.00 cm/s AV Vmean:          167.000 cm/s AV VTI:            0.452 m AV Peak Grad:      21.0 mmHg AV Mean Grad:      12.5 mmHg LVOT Vmax:         115.00 cm/s LVOT Vmean:        80.900 cm/s LVOT VTI:          0.226 m LVOT/AV VTI ratio: 0.50 AI PHT:            283 msec  AORTA Ao Root diam: 3.00 cm MITRAL VALVE MV Area (PHT): 2.29 cm     SHUNTS MV Decel Time: 331 msec     Systemic VTI:  0.23 m MV E velocity: 76.50 cm/s   Systemic Diam: 2.10 cm MV A velocity: 102.00 cm/s MV E/A ratio:  0.75  Jenkins Rouge MD Electronically signed by Jenkins Rouge MD Signature Date/Time: 08/23/2020/3:05:22 PM    Final    CT Angio Chest/Abd/Pel for Dissection W and/or Wo Contrast  Result Date: 08/23/2020 CLINICAL DATA:  Abdominal pain with aortic dissection suspected EXAM: CT ANGIOGRAPHY CHEST, ABDOMEN AND PELVIS TECHNIQUE: Non-contrast CT of the chest was initially obtained. Multidetector CT imaging through the chest, abdomen and pelvis was performed using the standard protocol during bolus administration of intravenous contrast. Multiplanar reconstructed images and MIPs were obtained and reviewed to evaluate the vascular anatomy. CONTRAST:  140mL OMNIPAQUE IOHEXOL 350 MG/ML SOLN COMPARISON:  Abdomen and pelvis CT 01/22/2005 FINDINGS: CTA CHEST FINDINGS Cardiovascular: Preferential opacification of the thoracic aorta. No evidence of thoracic aortic aneurysm or dissection. Normal heart size. No pericardial effusion. Aortic and coronary atheromatous calcification. Mediastinum/Nodes: 13 mm nodule in the  left thyroid. No followup recommended.(Ref: J Am Coll Radiol. 2015 Feb;12(2): 143-50). Lungs/Pleura: Mild dependent atelectasis. Assessment is limited by respiratory motion. There is no edema, consolidation, effusion, or pneumothorax. Musculoskeletal: Spondylosis Review of the MIP images confirms the above findings. CTA ABDOMEN AND PELVIS FINDINGS VASCULAR Aorta: Overall mild atheromatous plaque.  No aneurysm or dissection. Celiac: Plaque at the ostium without significant stenosis. No branch occlusion, beading, or aneurysm. SMA: Patent without evidence of aneurysm, dissection, vasculitis or significant stenosis. Renals: Atheromatous plaque at the left renal artery ostium. No stenosis, beading, or are aneurysm. IMA: Patent Inflow: Atheromatous plaque. Veins: Unremarkable in the arterial phase Review of the MIP images confirms the above findings. NON-VASCULAR Hepatobiliary: Chronic liver steatosis. Higher density towards the gallbladder fossa could be hyperemia or sparing.Distended gallbladder with pericholecystic edema and wall thickening. No calcified gallstone detected. No bile duct dilatation. Pancreas: Unremarkable. Spleen: Unremarkable. Adrenals/Urinary Tract: Negative adrenals. No hydronephrosis or stone. Unremarkable bladder. Stomach/Bowel: No obstruction. No visible bowel inflammation. Innumerable colonic diverticula. Lymphatic: No mass or adenopathy. Reproductive:Hysterectomy Other: No ascites or pneumoperitoneum. Musculoskeletal: No acute abnormalities. Sacroiliac osteoarthritis with sclerosis. Lumbar spine degeneration with mild levoscoliosis. Review of the MIP images confirms the above findings. IMPRESSION: 1. Findings of acute cholecystitis but no calcified gallstones. Suggest right upper quadrant ultrasound. 2. No acute aortic syndrome. 3.  Aortic Atherosclerosis (ICD10-I70.0). 4. Extensive colonic diverticulosis. Electronically Signed   By: Monte Fantasia M.D.   On: 08/23/2020 06:25   US ABDOMEN  LIMITED RUQ (LIVER/GB)  Result Date: 08/23/2020 CLINICAL DATA:  Right upper quadrant pain EXAM: ULTRASOUND ABDOMEN LIMITED RIGHT UPPER QUADRANT COMPARISON:  CT a of the chest abdomen pelvis from yesterday FINDINGS: Gallbladder: Confirmed multiple small gallstones with shadowing, measuring up to 5 mm. There is gallbladder wall thickening and pericholecystic edema. No sonographic Murphy sign. Common bile duct: Diameter: 6 mm. Liver: Echogenic liver with geographic sparing at the gallbladder fossa. Portal vein is patent on color Doppler imaging with normal direction of blood flow towards the liver. IMPRESSION: 1. Cholelithiasis, gallbladder distension, and wall thickening. Findings suggest acute cholecystitis, although there is no sonographic Murphy sign. Please correlate with analgesic history and initial exam. 2. Hepatic steatosis. Electronically Signed   By: Monte Fantasia M.D.   On: 08/23/2020 07:37      Subjective: No concerns today other than some abdominal pain  Discharge Exam: Vitals:   08/25/20 2043 08/26/20 0520  BP: (!) 144/60 136/62  Pulse: 66 (!) 59  Resp: 18 20  Temp: 97.8 F (36.6 C) 97.8 F (36.6 C)  SpO2: 95% 96%   Vitals:   08/25/20 1337 08/25/20 2043 08/26/20 0500 08/26/20 0520  BP: Marland Kitchen)  139/51 (!) 144/60  136/62  Pulse: (!) 58 66  (!) 59  Resp: 18 18  20   Temp: (!) 97.5 F (36.4 C) 97.8 F (36.6 C)  97.8 F (36.6 C)  TempSrc: Oral Oral  Oral  SpO2: 100% 95%  96%  Weight:   103.2 kg   Height:        General: Pt is alert, awake, not in acute distress Cardiovascular: RRR, S1/S2 +, no rubs, no gallops Respiratory: CTA bilaterally, no wheezing, no rhonchi Abdominal: Soft, mild abdominal tenderness, ND, bowel sounds + Extremities: no edema, no cyanosis    The results of significant diagnostics from this hospitalization (including imaging, microbiology, ancillary and laboratory) are listed below for reference.     Microbiology: Recent Results (from the past  240 hour(s))  Resp Panel by RT-PCR (Flu A&B, Covid) Nasopharyngeal Swab     Status: None   Collection Time: 08/22/20 11:27 PM   Specimen: Nasopharyngeal Swab; Nasopharyngeal(NP) swabs in vial transport medium  Result Value Ref Range Status   SARS Coronavirus 2 by RT PCR NEGATIVE NEGATIVE Final    Comment: (NOTE) SARS-CoV-2 target nucleic acids are NOT DETECTED.  The SARS-CoV-2 RNA is generally detectable in upper respiratory specimens during the acute phase of infection. The lowest concentration of SARS-CoV-2 viral copies this assay can detect is 138 copies/mL. A negative result does not preclude SARS-Cov-2 infection and should not be used as the sole basis for treatment or other patient management decisions. A negative result may occur with  improper specimen collection/handling, submission of specimen other than nasopharyngeal swab, presence of viral mutation(s) within the areas targeted by this assay, and inadequate number of viral copies(<138 copies/mL). A negative result must be combined with clinical observations, patient history, and epidemiological information. The expected result is Negative.  Fact Sheet for Patients:  EntrepreneurPulse.com.au  Fact Sheet for Healthcare Providers:  IncredibleEmployment.be  This test is no t yet approved or cleared by the [place] FDA and  has been authorized for detection and/or diagnosis of SARS-CoV-2 by FDA under an Emergency Use Authorization (EUA). This EUA will remain  in effect (meaning this test can be used) for the duration of the COVID-19 declaration under Section 564(b)(1) of the Act, 21 U.S.C.section 360bbb-3(b)(1), unless the authorization is terminated  or revoked sooner.       Influenza A by PCR NEGATIVE NEGATIVE Final   Influenza B by PCR NEGATIVE NEGATIVE Final    Comment: (NOTE) The Xpert Xpress SARS-CoV-2/FLU/RSV plus assay is intended as an aid in the diagnosis of influenza  from Nasopharyngeal swab specimens and should not be used as a sole basis for treatment. Nasal washings and aspirates are unacceptable for Xpert Xpress SARS-CoV-2/FLU/RSV testing.  Fact Sheet for Patients: EntrepreneurPulse.com.au  Fact Sheet for Healthcare Providers: IncredibleEmployment.be  This test is not yet approved or cleared by the [place] FDA and has been authorized for detection and/or diagnosis of SARS-CoV-2 by FDA under an Emergency Use Authorization (EUA). This EUA will remain in effect (meaning this test can be used) for the duration of the COVID-19 declaration under Section 564(b)(1) of the Act, 21 U.S.C. section 360bbb-3(b)(1), unless the authorization is terminated or revoked.  Performed at [facility], [place] [address], [place], [place] [address]      Labs: BNP (last 3 results) Recent Labs    09/29/19 1305  BNP 956.2*   Basic Metabolic Panel: Recent Labs  Lab 08/22/20 1929 08/23/20 1548 08/24/20 0348 08/25/20 0340  NA 136  139 138 135  K 4.1 3.7 3.5 3.5  CL 102 106 109 107  CO2 25 19* 18* 21*  GLUCOSE 122* 89 95 82  BUN 12 10 14 20   CREATININE 0.59 0.56 0.62 0.67  CALCIUM 9.6 8.9 8.4* 8.0*  MG  --  2.1  --   --    Liver Function Tests: Recent Labs  Lab 08/22/20 2200 08/24/20 0348 08/25/20 0340  AST 25 14* 28  ALT 16 11 16   ALKPHOS 38 35* 40  BILITOT 1.5* 1.3* 1.0  PROT 7.9 6.1* 5.7*  ALBUMIN 4.4 3.0* 2.8*   Recent Labs  Lab 08/22/20 2200 08/24/20 0348  LIPASE 24 18   No results for input(s): AMMONIA in the last 168 hours. CBC: Recent Labs  Lab 08/22/20 1929 08/24/20 0348 08/25/20 0340 08/26/20 0334  WBC 10.9* 19.7* 15.1* 12.1*  HGB 12.9 12.3 10.6* 10.7*  HCT 40.1 39.1 33.3* 33.4*  MCV 95.2 96.8 96.0 94.4  PLT 260 208 183 225   Cardiac Enzymes: No results for input(s): CKTOTAL, CKMB, CKMBINDEX, TROPONINI in the last 168 hours. BNP: Invalid input(s):  POCBNP CBG: Recent Labs  Lab 08/25/20 1121 08/25/20 1613 08/25/20 2045 08/26/20 0751 08/26/20 1226  GLUCAP 114* 132* 95 98 84   D-Dimer No results for input(s): DDIMER in the last 72 hours. Hgb A1c No results for input(s): HGBA1C in the last 72 hours. Lipid Profile No results for input(s): CHOL, HDL, LDLCALC, TRIG, CHOLHDL, LDLDIRECT in the last 72 hours. Thyroid function studies No results for input(s): TSH, T4TOTAL, T3FREE, THYROIDAB in the last 72 hours.  Invalid input(s): FREET3 Anemia work up No results for input(s): VITAMINB12, FOLATE, FERRITIN, TIBC, IRON, RETICCTPCT in the last 72 hours. Urinalysis    Component Value Date/Time   COLORURINE YELLOW 05/09/2020 1127   APPEARANCEUR CLEAR 05/09/2020 1127   LABSPEC 1.036 (H) 05/09/2020 1127   PHURINE 5.0 05/09/2020 1127   GLUCOSEU >=500 (A) 05/09/2020 1127   HGBUR SMALL (A) 05/09/2020 1127   BILIRUBINUR NEGATIVE 05/09/2020 1127   KETONESUR NEGATIVE 05/09/2020 1127   PROTEINUR NEGATIVE 05/09/2020 1127   UROBILINOGEN 1.0 11/30/2013 1224   NITRITE NEGATIVE 05/09/2020 1127   LEUKOCYTESUR NEGATIVE 05/09/2020 1127   Sepsis Labs Invalid input(s): PROCALCITONIN,  WBC,  LACTICIDVEN Microbiology Recent Results (from the past 240 hour(s))  Resp Panel by RT-PCR (Flu A&B, Covid) Nasopharyngeal Swab     Status: None   Collection Time: 08/22/20 11:27 PM   Specimen: Nasopharyngeal Swab; Nasopharyngeal(NP) swabs in vial transport medium  Result Value Ref Range Status   SARS Coronavirus 2 by RT PCR NEGATIVE NEGATIVE Final    Comment: (NOTE) SARS-CoV-2 target nucleic acids are NOT DETECTED.  The SARS-CoV-2 RNA is generally detectable in upper respiratory specimens during the acute phase of infection. The lowest concentration of SARS-CoV-2 viral copies this assay can detect is 138 copies/mL. A negative result does not preclude SARS-Cov-2 infection and should not be used as the sole basis for treatment or other patient management  decisions. A negative result may occur with  improper specimen collection/handling, submission of specimen other than nasopharyngeal swab, presence of viral mutation(s) within the areas targeted by this assay, and inadequate number of viral copies(<138 copies/mL). A negative result must be combined with clinical observations, patient history, and epidemiological information. The expected result is Negative.  Fact Sheet for Patients:  EntrepreneurPulse.com.au  Fact Sheet for Healthcare Providers:  IncredibleEmployment.be  This test is no t yet approved or cleared by the [place] and  has been authorized for detection and/or diagnosis of SARS-CoV-2 by FDA under an Emergency Use Authorization (EUA). This EUA will remain  in effect (meaning this test can be used) for the duration of the COVID-19 declaration under Section 564(b)(1) of the Act, 21 U.S.C.section 360bbb-3(b)(1), unless the authorization is terminated  or revoked sooner.       Influenza A by PCR NEGATIVE NEGATIVE Final   Influenza B by PCR NEGATIVE NEGATIVE Final    Comment: (NOTE) The Xpert Xpress SARS-CoV-2/FLU/RSV plus assay is intended as an aid in the diagnosis of influenza from Nasopharyngeal swab specimens and should not be used as a sole basis for treatment. Nasal washings and aspirates are unacceptable for Xpert Xpress SARS-CoV-2/FLU/RSV testing.  Fact Sheet for Patients: EntrepreneurPulse.com.au  Fact Sheet for Healthcare Providers: IncredibleEmployment.be  This test is not yet approved or cleared by the [place] FDA and has been authorized for detection and/or diagnosis of SARS-CoV-2 by FDA under an Emergency Use Authorization (EUA). This EUA will remain in effect (meaning this test can be used) for the duration of the COVID-19 declaration under Section 564(b)(1) of the Act, 21 U.S.C. section 360bbb-3(b)(1), unless the  authorization is terminated or revoked.  Performed at [facility], [place] [address] [place], [place] [address]      Time coordinating discharge: 35 minutes  SIGNED:   Cordelia Poche, MD [facility] 08/26/2020, 12:34 PM

## 2020-08-26 NOTE — Progress Notes (Signed)
Pharmacy Antibiotic Note  Julie Graves is a 80 y.o. female admitted on 08/22/2020 with acalculous cholecystitis on CTA, CCS consulted.  Pharmacy has been consulted for cefepime dosing; MD dosing flagyl.  Today, 08/26/2020 Day #4 of 7 planned abx - afebrile - WBC down 12.1 - SCr 0.67, CrCl ~61 ml/min  Plan: Cefepime 2g IV q8h Flagyl 500mg  IV q8h per MD Follow up renal function & cultures   Height: 5' (152.4 cm) Weight: 103.2 kg (227 lb 8.2 oz) IBW/kg (Calculated) : 45.5  Temp (24hrs), Avg:97.7 F (36.5 C), Min:97.5 F (36.4 C), Max:97.8 F (36.6 C)  Recent Labs  Lab 08/22/20 1929 08/23/20 1548 08/24/20 0348 08/25/20 0340 08/26/20 0334  WBC 10.9*  --  19.7* 15.1* 12.1*  CREATININE 0.59 0.56 0.62 0.67  --     Estimated Creatinine Clearance: 61.8 mL/min (by C-G formula based on SCr of 0.67 mg/dL).    Allergies  Allergen Reactions  . Iodine Hives and Swelling  . Shellfish Allergy Swelling and Other (See Comments)    Swelling - throat and lips  . Topiramate     Other reaction(s): Chest Pain  . Metamucil [Psyllium]   . Lisinopril Other (See Comments)    cough  . Penicillins Hives, Rash and Other (See Comments)    Has patient had a PCN reaction causing immediate rash, facial/tongue/throat swelling, SOB or lightheadedness with hypotension: Unknown Has patient had a PCN reaction causing severe rash involving mucus membranes or skin necrosis: No Has patient had a PCN reaction that required hospitalization: No Has patient had a PCN reaction occurring within the last 10 years: No If all of the above answers are "NO", then may proceed with Cephalosporin use.   . Tramadol Other (See Comments)    confusion    Antimicrobials this admission: 3/1 Cefepime >> 3/1 Flagyl >>  Dose adjustments this admission:  Microbiology results: 3/1 COVID: neg 3/1 Flu: neg  Thank you for allowing pharmacy to be a part of this patient's care.  Peggyann Juba, PharmD,  BCPS Pharmacy: [phone] 08/26/2020 9:25 AM

## 2020-08-26 NOTE — Plan of Care (Signed)

## 2020-08-26 NOTE — Progress Notes (Signed)
Nutrition Education Note  RD consulted for nutrition education for patient s/p lap cholecystectomy.  Lipid Panel     Component Value Date/Time   CHOL 140 07/09/2014 1637   TRIG 243 (H) 07/09/2014 1637   HDL 55 07/09/2014 1637   CHOLHDL 2.5 07/09/2014 1637   VLDL 49 (H) 07/09/2014 1637   LDLCALC 36 07/09/2014 1637    RD provided "Fat restricted Nutrition Therapy" handout from the Academy of Nutrition and Dietetics. Reviewed patient's dietary recall. Provided examples on ways to decrease fat intake in diet. Discouraged intake of processed meats and frying of foods. Encouraged fresh fruits and vegetables as well as whole grain sources of carbohydrates to maximize fiber intake. Teach back method used.  Expect fair compliance. Pt with not many questions. Appreciated visit and handout.  Body mass index is 44.43 kg/m. Pt meets criteria for morbid obesity based on current BMI.  Current diet order is heart healthy. Labs and medications reviewed. No further nutrition interventions warranted at this time.  If additional nutrition issues arise, please re-consult RD.  Clayton Bibles, MS, RD, LDN Inpatient Clinical Dietitian Contact information available via Amion

## 2020-08-26 NOTE — Progress Notes (Signed)
[facility] Progress Note  2 Days Post-Op  Subjective: CC:  NAEO. Reports abdominal soreness that improves with PO meds.  Tolerating solid food but only had crackers and PB for dinner. Voiding without sxs. reports flatus and a non-bloody BM. At baseline mobilizes with an assistive device and goes to outpatient physical therapy twice weekly.  Objective: Vital signs in last 24 hours: Temp:  [97.5 F (36.4 C)-97.8 F (36.6 C)] 97.8 F (36.6 C) (03/04 0520) Pulse Rate:  [58-66] 59 (03/04 0520) Resp:  [18-20] 20 (03/04 0520) BP: (136-144)/(51-62) 136/62 (03/04 0520) SpO2:  [95 %-100 %] 96 % (03/04 0520) Weight:  [103.2 kg] 103.2 kg (03/04 0500) Last BM Date: 08/26/20  Intake/Output from previous day: 03/03 0701 - 03/04 0700 In: 1190.1 [P.O.:240; IV Piggyback:950.1] Out: -  Intake/Output this shift: No intake/output data recorded.  PE: Gen:  Alert, NAD, pleasant Pulm:  Normal effort ORA Abd: Soft, obese, approp tender, incisions C/D/I Skin: warm and dry, no rashes  Psych: A&Ox3   Lab Results:  Recent Labs    08/25/20 0340 08/26/20 0334  WBC 15.1* 12.1*  HGB 10.6* 10.7*  HCT 33.3* 33.4*  PLT 183 225   BMET Recent Labs    08/24/20 0348 08/25/20 0340  NA 138 135  K 3.5 3.5  CL 109 107  CO2 18* 21*  GLUCOSE 95 82  BUN 14 20  CREATININE 0.62 0.67  CALCIUM 8.4* 8.0*   PT/INR No results for input(s): LABPROT, INR in the last 72 hours. CMP     Component Value Date/Time   NA 135 08/25/2020 0340   NA 140 09/29/2019 1305   K 3.5 08/25/2020 0340   CL 107 08/25/2020 0340   CO2 21 (L) 08/25/2020 0340   GLUCOSE 82 08/25/2020 0340   BUN 20 08/25/2020 0340   BUN 17 09/29/2019 1305   CREATININE 0.67 08/25/2020 0340   CREATININE 0.70 07/09/2014 1637   CALCIUM 8.0 (L) 08/25/2020 0340   PROT 5.7 (L) 08/25/2020 0340   ALBUMIN 2.8 (L) 08/25/2020 0340   AST 28 08/25/2020 0340   ALT 16 08/25/2020 0340   ALKPHOS 40 08/25/2020 0340   BILITOT 1.0 08/25/2020  0340   GFRNONAA >60 08/25/2020 0340   GFRAA 93 09/29/2019 1305   Lipase     Component Value Date/Time   LIPASE 18 08/24/2020 0348       Studies/Results: No results found.  Anti-infectives: Anti-infectives (From admission, onward)   Start     Dose/Rate Route Frequency Ordered Stop   08/23/20 1400  metroNIDAZOLE (FLAGYL) IVPB 500 mg        500 mg 100 mL/hr over 60 Minutes Intravenous Every 8 hours 08/23/20 0818     08/23/20 1400  ceFEPIme (MAXIPIME) 2 g in sodium chloride 0.9 % 100 mL IVPB        2 g 200 mL/hr over 30 Minutes Intravenous Every 8 hours 08/23/20 1131     08/23/20 0645  ceFEPIme (MAXIPIME) 1 g in sodium chloride 0.9 % 100 mL IVPB  Status:  Discontinued       "And" Linked Group Details   1 g 200 mL/hr over 30 Minutes Intravenous  Once 08/23/20 0633 08/23/20 0639   08/23/20 0645  metroNIDAZOLE (FLAGYL) IVPB 500 mg  Status:  Discontinued       "And" Linked Group Details   500 mg 100 mL/hr over 60 Minutes Intravenous  Once 08/23/20 0633 08/23/20 0639   08/23/20 0645  ceFEPIme (MAXIPIME) 2 g in  sodium chloride 0.9 % 100 mL IVPB       "And" Linked Group Details   2 g 200 mL/hr over 30 Minutes Intravenous  Once 08/23/20 0640 08/23/20 0719   08/23/20 0645  metroNIDAZOLE (FLAGYL) IVPB 500 mg       "And" Linked Group Details   500 mg 100 mL/hr over 60 Minutes Intravenous  Once 08/23/20 0640 08/23/20 0730     Assessment/Plan Nonischemic cardiomyopathy Chronic diastolic CHF Chronic stable angina Elevated troponin - cleared by cards Mild-moderate aortic insufficiency  Diabetes BMI 39 Hyperlipidemia  Abdominal pain/chest pain Acute gangrenous cholecystitis  POD#2 s/p laparoscopic cholecystectomy 08/24/20 Dr. Brantley Stage - AFVSS, LFTs WNL - PT recommending HHPT, pt already a part of an outpatient PT clinic  - tolerating small amounts of solid food  - stable for discharge from a surgical perspective. Continue IV abx while here but may switch to cipro/flagyl at D/C to  complete a total of 7 days. Follow up provided.    LOS: 3 days    Obie Dredge, [facility] Please see Amion for pager number during day hours 7:00am-4:30pm

## 2020-08-26 NOTE — Care Management Important Message (Signed)
Important Message  Patient Details IM Letter given to the Patient. Name: Julie Graves MRN: [id number] Date of Birth: Oct 07, 1940   Medicare Important Message Given:  Yes     Kerin Salen 08/26/2020, 10:36 AM

## 2020-08-30 ENCOUNTER — Encounter: Payer: Medicare Other | Admitting: Physical Therapy

## 2020-09-01 ENCOUNTER — Encounter: Payer: Medicare Other | Admitting: Physical Therapy

## 2020-09-06 ENCOUNTER — Ambulatory Visit (INDEPENDENT_AMBULATORY_CARE_PROVIDER_SITE_OTHER): Payer: Medicare Other | Admitting: Physical Therapy

## 2020-09-06 ENCOUNTER — Encounter: Payer: Self-pay | Admitting: Physical Therapy

## 2020-09-06 ENCOUNTER — Other Ambulatory Visit: Payer: Self-pay

## 2020-09-06 DIAGNOSIS — M25562 Pain in left knee: Secondary | ICD-10-CM | POA: Diagnosis not present

## 2020-09-06 DIAGNOSIS — M25662 Stiffness of left knee, not elsewhere classified: Secondary | ICD-10-CM

## 2020-09-06 DIAGNOSIS — M6281 Muscle weakness (generalized): Secondary | ICD-10-CM | POA: Diagnosis not present

## 2020-09-06 DIAGNOSIS — R262 Difficulty in walking, not elsewhere classified: Secondary | ICD-10-CM | POA: Diagnosis not present

## 2020-09-06 DIAGNOSIS — G8929 Other chronic pain: Secondary | ICD-10-CM

## 2020-09-06 DIAGNOSIS — R600 Localized edema: Secondary | ICD-10-CM

## 2020-09-06 NOTE — Therapy (Signed)
[facility] [address] [place], [place], [address] Phone: [phone]   Fax:  [phone]  Physical Therapy Treatment/recert/progress note Progress Note reporting period 08/18/20 to 09/06/20  See below for objective and subjective measurements relating to patients progress with PT.   Patient Details  Name: Julie Graves MRN: [id number] Date of Birth: Jan 09, 1941 Referring Provider (PT): Rodell Perna   Encounter Date: 09/06/2020   PT End of Session - 09/06/20 1615    Visit Number 13    Number of Visits 21    Date for PT Re-Evaluation 10/04/20    Authorization Type UHC MCR    Progress Note Due on Visit 23    PT Start Time 1513    PT Stop Time 1600    PT Time Calculation (min) 47 min    Activity Tolerance Patient tolerated treatment well;No increased pain;Patient limited by fatigue    Behavior During Therapy [facility] for tasks assessed/performed           Past Medical History:  Diagnosis Date  . Anginal pain ([place])    "small pain?nerves in chest"started 10/05/13   . Anginal pain ([place])    cleared by cardiology 3/15  . Anxiety    rx given recent not taken yet  . Aortic insufficiency    mild on 9/11 cath  . CAD (coronary artery disease)    nonobstructive let heart cath 3/11: 40% ostial D1, 30% mid CFX  . CHF (congestive heart failure) ([place])   . Colon polyps 02/01/2009    MULTIPLE FRAGMENTS OF TUBULAR ADENOMAS  . Diabetes mellitus   . Frozen shoulder   . GERD (gastroesophageal reflux disease)    occ  . History of blood transfusion    pregnancy  . HLD (hyperlipidemia)   . HTN (hypertension)    ACEI cough  . Hx of cardiovascular stress test    Lexiscan Myoview (10/2013):  Fixed ant defect most c/w breast attenuation, cannot exclude apical infarct; no ischemia, EF 46%; Low Risk  . Nonischemic cardiomyopathy ([place])    Ech 3/11 difficult study, moderate aortic insuficiency noted. Left evntriculogram 3/11  . Obese   . Osteoarthritis   .  Palpitation    PACs noted on telemetry while in the hospital  . Pneumonia    hx  . Stress incontinence     Past Surgical History:  Procedure Laterality Date  . ABDOMINAL HYSTERECTOMY    . APPENDECTOMY    . BLADDER NECK SUSPENSION  85  . CARDIAC CATHETERIZATION    . CHOLECYSTECTOMY N/A 08/24/2020   Procedure: LAPAROSCOPIC CHOLECYSTECTOMY;  Surgeon: Erroll Luna, MD;  Location: [facility];  Service: General;  Laterality: N/A;  . EYE SURGERY    . HAND SURGERY     right "had knots cut out"  . KNEE ARTHROPLASTY Right 12/09/2013   Procedure: COMPUTER ASSISTED Right TOTAL KNEE ARTHROPLASTY;  Surgeon: Marybelle Killings, MD;  Location: [place];  Service: Orthopedics;  Laterality: Right;  . LEFT HEART CATH AND CORONARY ANGIOGRAPHY N/A 08/15/2017   Procedure: LEFT HEART CATH AND CORONARY ANGIOGRAPHY;  Surgeon: Jettie Booze, MD;  Location: [place] [facility];  Service: Cardiovascular;  Laterality: N/A;  . TOTAL KNEE ARTHROPLASTY Left 05/13/2020   Procedure: LEFT TOTAL KNEE ARTHROPLASTY;  Surgeon: Marybelle Killings, MD;  Location: [place];  Service: Orthopedics;  Laterality: Left;  . TUBAL LIGATION      There were no vitals filed for this visit.   Subjective Assessment - 09/06/20 1559  Subjective [name] notes she was hospitalized and had LAPAROSCOPIC CHOLECYSTECTOMY. She is now cleared by MD to return to PT and she states more overall Lt knee stiffness and edema.    Pertinent History CAD, HTN, DM aortic insuffiency, hyperlipidemia OA, obesity, anxiety, h/o pneumonia, R TKA 2015, hysterectomy, heart cath angiograph 2019.    Limitations Standing;Walking    How long can you stand comfortably? < 10 minutes    How long can you walk comfortably? 5 minutes    Diagnostic tests x-ray    Patient Stated Goals To be able to go up and down a flight of stairs.  Walk without "relying" on the cane.    Currently in Pain? Yes    Pain Score 5     Pain Location Knee    Pain Orientation Left    Pain Onset More than a  month ago              [facility] PT Assessment - 09/06/20 0001      Assessment   Medical Diagnosis Lt TKA    Referring Provider (PT) Rodell Perna      AROM   Left Knee Extension -1    Left Knee Flexion 95      PROM   Left Knee Flexion 103      Strength   Left Knee Flexion 4/5    Left Knee Extension 4/5                         [facility] Adult PT Treatment/Exercise - 09/06/20 0001      Knee/Hip Exercises: Stretches   Active Hamstring Stretch Both;2 reps;30 seconds    Active Hamstring Stretch Limitations seated    Knee: Self-Stretch Limitations seated tailgate stretch for flexion with self OP 10 sec X 3 min, performed bilat      Knee/Hip Exercises: Aerobic   Nustep seat 5 BUEs & BLEs level 4 for 10 min      Knee/Hip Exercises: Seated   Long Arc Quad Both;2 sets;10 reps    Long Arc Quad Weight 4 lbs.    Other Seated Knee/Hip Exercises seated SLR 2 sets of 10 bilat      Knee/Hip Exercises: Supine   Short Arc Quad Sets Both;3 sets;10 reps      Vasopneumatic   Number Minutes Vasopneumatic  10 minutes    Vasopnuematic Location  Knee    Vasopneumatic Pressure Medium    Vasopneumatic Temperature  34      Manual Therapy   Manual therapy comments Lt knee PROM flexion and extension, flexion mobs                    PT Short Term Goals - 08/18/20 1608      PT SHORT TERM GOAL #1   Title Pt will be independent in her initial HEP.    Time 3    Period Weeks    Status Achieved    Target Date 07/29/20      PT SHORT TERM GOAL #2   Title Pt will be able to perform sit to stand with no UE support    Time 3    Period Weeks    Status Achieved    Target Date 07/29/20             PT Long Term Goals - 09/06/20 1621      PT LONG TERM GOAL #1   Title independent with HEP and progression  Time 8    Period Weeks    Status On-going      PT LONG TERM GOAL #2   Title Pt will be albe to amb with no assistive device >/= 500 feet with pain </= 2/10.     Baseline amb now with [facility] for short distances    Time 8    Period Weeks    Status On-going      PT LONG TERM GOAL #3   Title improve strength in RLE to at least 4/5 knee flex/ext for improved mobility.    Time 8    Period Weeks    Status Achieved      PT LONG TERM GOAL #4   Title Pt will be albe to improve her L knee active  flexion to 100 degrees for improved mobility and gait.    Baseline 90 AROM 3/15    Time 8    Period Weeks    Status On-going      PT LONG TERM GOAL #5   Title Pt will improve her FOTO score to >/= 57% function.    Baseline 38% on 07/05/2020, 59 on 08/18/2020    Time 8    Period Weeks    Status Achieved                 Plan - 09/06/20 1617    Clinical Impression Statement Progress note and recert today. She was making fair overall progress with her PT S/P Lt TKA then she had recent hospitilizaiton and this set her progress back some with ROM, knee swelling, knee strength, gait and overall endurance. PT recommended to extend her plan of care to continue to work to improve these functonal deficits. Recommending about 2 times per week for another 4 weeks.    Personal Factors and Comorbidities Comorbidity 3+    Comorbidities R TKA, CHF, anxiety, CAD, DM, HTN, OA, obesity, aortic insufficiency,    Examination-Activity Limitations Lift;Transfers;Stairs;Stand    Examination-Participation Restrictions Church;Other    Stability/Clinical Decision Making Stable/Uncomplicated    Rehab Potential Good    PT Frequency 2x / week    PT Duration 4 weeks    PT Treatment/Interventions ADLs/Self Care Home Management;Cryotherapy;Electrical Stimulation;Ultrasound;Gait training;Stair training;Functional mobility training;Therapeutic activities;Therapeutic exercise;Balance training;Neuromuscular re-education;Patient/family education;Manual techniques;Scar mobilization;Passive range of motion    PT Next Visit Plan knee fleixon ROM, Gait , quadriceps strength, stairs    PT Home  Exercise Plan Access Code: [email or web address]    Consulted and Agree with Plan of Care Patient           Patient will benefit from skilled therapeutic intervention in order to improve the following deficits and impairments:  Postural dysfunction,Decreased mobility,Decreased strength,Increased edema,Impaired flexibility,Decreased balance,Decreased activity tolerance,Pain,Obesity,Difficulty walking  Visit Diagnosis: Difficulty in walking, not elsewhere classified  Muscle weakness (generalized)  Stiffness of left knee, not elsewhere classified  Chronic pain of left knee  Localized edema     Problem List Patient Active Problem List   Diagnosis Date Noted  . Acute on chronic diastolic CHF (congestive heart failure) ([place]) 08/24/2020  . Acute cholecystitis 08/23/2020  . Elevated troponin 08/23/2020  . Arthritis of left knee 05/13/2020  . S/P total knee arthroplasty, left 05/13/2020  . History of total knee arthroplasty, right 03/31/2020  . Precordial pain   . Chest pain 08/13/2017  . Mixed dyslipidemia 08/13/2017  . CAD (coronary artery disease), native coronary artery 08/13/2017  . Mild episode of recurrent major depressive disorder ([place]) 11/03/2013  . Urge incontinence 04/02/2013  .  Spinal stenosis, lumbar region, without neurogenic claudication 10/27/2012  . Spinal stenosis of lumbar region 10/01/2012  . Allergic rhinitis 09/26/2011  . Dizziness 01/23/2011  . Dyspnea 01/23/2011  . Type II diabetes mellitus ([place]) 06/27/2010  . Morbid obesity ([place]) 06/27/2010  . Impaired glucose tolerance 06/27/2010  . Disorder of bone and cartilage 06/22/2010  . Primary cardiomyopathy ([place]) 03/23/2010  . HYPERLIPIDEMIA-MIXED 03/07/2010  . Essential hypertension 03/07/2010  . CARDIOMEGALY 11/30/2009  . OTHER DYSPNEA AND RESPIRATORY ABNORMALITIES 11/30/2009  . AORTIC REGURGITATION 09/28/2009  . Chronic diastolic heart failure ([place]) 09/28/2009  . Aortic regurgitation 09/28/2009  . 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 Osteoarthrosis involving multiple sites 10/26/2008    Debbe Odea, PT,DPT 09/06/2020, 4:24 PM  [facility] [address] [place], [place], [address] Phone: [phone]   Fax:  [phone]  Name: LILIE VEZINA MRN: [id number] Date of Birth: 05-26-1941

## 2020-09-08 ENCOUNTER — Ambulatory Visit (INDEPENDENT_AMBULATORY_CARE_PROVIDER_SITE_OTHER): Payer: Medicare Other | Admitting: Physical Therapy

## 2020-09-08 ENCOUNTER — Other Ambulatory Visit: Payer: Self-pay

## 2020-09-08 ENCOUNTER — Encounter: Payer: Self-pay | Admitting: Physical Therapy

## 2020-09-08 DIAGNOSIS — M25662 Stiffness of left knee, not elsewhere classified: Secondary | ICD-10-CM

## 2020-09-08 DIAGNOSIS — R262 Difficulty in walking, not elsewhere classified: Secondary | ICD-10-CM

## 2020-09-08 DIAGNOSIS — M25562 Pain in left knee: Secondary | ICD-10-CM

## 2020-09-08 DIAGNOSIS — M6281 Muscle weakness (generalized): Secondary | ICD-10-CM | POA: Diagnosis not present

## 2020-09-08 DIAGNOSIS — R600 Localized edema: Secondary | ICD-10-CM | POA: Diagnosis not present

## 2020-09-08 DIAGNOSIS — G8929 Other chronic pain: Secondary | ICD-10-CM

## 2020-09-08 NOTE — Therapy (Signed)
[place], [place], [address] Phone: [phone]   Fax:  [phone]  Physical Therapy Treatment  Patient Details  Name: Julie Graves MRN: [id number] Date of Birth: 1940/12/15 Referring Provider (PT): Rodell Perna   Encounter Date: 09/08/2020   PT End of Session - 09/08/20 1601    Visit Number 14    Number of Visits 21    Date for PT Re-Evaluation 10/04/20    Authorization Type UHC MCR    Progress Note Due on Visit 23    PT Start Time 1518    PT Stop Time 1556    PT Time Calculation (min) 38 min    Activity Tolerance Patient tolerated treatment well;No increased pain;Patient limited by fatigue    Behavior During Therapy [facility] for tasks assessed/performed           Past Medical History:  Diagnosis Date  . Anginal pain ([place])    "small pain?nerves in chest"started 10/05/13   . Anginal pain ([place])    cleared by cardiology 3/15  . Anxiety    rx given recent not taken yet  . Aortic insufficiency    mild on 9/11 cath  . CAD (coronary artery disease)    nonobstructive let heart cath 3/11: 40% ostial D1, 30% mid CFX  . CHF (congestive heart failure) ([place])   . Colon polyps 02/01/2009    MULTIPLE FRAGMENTS OF TUBULAR ADENOMAS  . Diabetes mellitus   . Frozen shoulder   . GERD (gastroesophageal reflux disease)    occ  . History of blood transfusion    pregnancy  . HLD (hyperlipidemia)   . HTN (hypertension)    ACEI cough  . Hx of cardiovascular stress test    Lexiscan Myoview (10/2013):  Fixed ant defect most c/w breast attenuation, cannot exclude apical infarct; no ischemia, EF 46%; Low Risk  . Nonischemic cardiomyopathy ([place])    Ech 3/11 difficult study, moderate aortic insuficiency noted. Left evntriculogram 3/11  . Obese   . Osteoarthritis   . Palpitation    PACs noted on telemetry while in the hospital  . Pneumonia    hx  . Stress incontinence     Past Surgical History:  Procedure Laterality Date   . ABDOMINAL HYSTERECTOMY    . APPENDECTOMY    . BLADDER NECK SUSPENSION  85  . CARDIAC CATHETERIZATION    . CHOLECYSTECTOMY N/A 08/24/2020   Procedure: LAPAROSCOPIC CHOLECYSTECTOMY;  Surgeon: Erroll Luna, MD;  Location[facility];  Service: General;  Laterality: N/A;  . EYE SURGERY    . HAND SURGERY     right "had knots cut out"  . KNEE ARTHROPLASTY Right 12/09/2013   Procedure: COMPUTER ASSISTED Right TOTAL KNEE ARTHROPLASTY;  Surgeon: Marybelle Killings, MD;  Location: [place];  Service: Orthopedics;  Laterality: Right;  . LEFT HEART CATH AND CORONARY ANGIOGRAPHY N/A 08/15/2017   Procedure: LEFT HEART CATH AND CORONARY ANGIOGRAPHY;  Surgeon: Jettie Booze, MD;  Location: [place] [facility];  Service: Cardiovascular;  Laterality: N/A;  . TOTAL KNEE ARTHROPLASTY Left 05/13/2020   Procedure: LEFT TOTAL KNEE ARTHROPLASTY;  Surgeon: Marybelle Killings, MD;  Location: [place];  Service: Orthopedics;  Laterality: Left;  . TUBAL LIGATION      There were no vitals filed for this visit.   Subjective Assessment - 09/08/20 1529    Subjective Julie Graves notes she is doing a little better since last session but still wiht Lt knee stiffness and edema.  Pertinent History CAD, HTN, DM aortic insuffiency, hyperlipidemia OA, obesity, anxiety, h/o pneumonia, R TKA 2015, hysterectomy, heart cath angiograph 2019.    Limitations Standing;Walking    How long can you stand comfortably? < 10 minutes    How long can you walk comfortably? 5 minutes    Diagnostic tests x-ray    Patient Stated Goals To be able to go up and down a flight of stairs.  Walk without "relying" on the cane.    Pain Onset More than a month ago            [facility] Adult PT Treatment/Exercise - 09/08/20 0001      Knee/Hip Exercises: Stretches   Active Hamstring Stretch Both;2 reps;30 seconds    Active Hamstring Stretch Limitations seated    Knee: Self-Stretch Limitations seated tailgate stretch for flexion with self OP 10 sec X 3 min, performed  bilat      Knee/Hip Exercises: Aerobic   Nustep seat 5, L5 for 8 min      Knee/Hip Exercises: Seated   Long Arc Quad Both;10 reps;3 sets    [employer] Weight 4 lbs.    Other Seated Knee/Hip Exercises seated SLR 2 sets of 10 bilat    Sit to Sand 2 sets;10 reps;without UE support   raised mat surface                   PT Short Term Goals - 08/18/20 1608      PT SHORT TERM GOAL #1   Title Pt will be independent in her initial HEP.    Time 3    Period Weeks    Status Achieved    Target Date 07/29/20      PT SHORT TERM GOAL #2   Title Pt will be able to perform sit to stand with no UE support    Time 3    Period Weeks    Status Achieved    Target Date 07/29/20             PT Long Term Goals - 09/06/20 1621      PT LONG TERM GOAL #1   Title independent with HEP and progression    Time 8    Period Weeks    Status On-going      PT LONG TERM GOAL #2   Title Pt will be albe to amb with no assistive device >/= 500 feet with pain </= 2/10.    Baseline amb now with [facility] for short distances    Time 8    Period Weeks    Status On-going      PT LONG TERM GOAL #3   Title improve strength in RLE to at least 4/5 knee flex/ext for improved mobility.    Time 8    Period Weeks    Status Achieved      PT LONG TERM GOAL #4   Title Pt will be albe to improve her L knee active  flexion to 100 degrees for improved mobility and gait.    Baseline 90 AROM 3/15    Time 8    Period Weeks    Status On-going      PT LONG TERM GOAL #5   Title Pt will improve her FOTO score to >/= 57% function.    Baseline 38% on 07/05/2020, 59 on 08/18/2020    Time 8    Period Weeks    Status Achieved  Plan - 09/08/20 1603    Clinical Impression Statement Continued to work towards Lt knee ROM and overall strength/edurance. She declined needing vaso but does still have edema noted in Lt LE. Progress as tolerated.    Personal Factors and Comorbidities Comorbidity 3+     Comorbidities R TKA, CHF, anxiety, CAD, DM, HTN, OA, obesity, aortic insufficiency,    Examination-Activity Limitations Lift;Transfers;Stairs;Stand    Examination-Participation Restrictions Church;Other    Stability/Clinical Decision Making Stable/Uncomplicated    Rehab Potential Good    PT Frequency 2x / week    PT Duration 4 weeks    PT Treatment/Interventions ADLs/Self Care Home Management;Cryotherapy;Electrical Stimulation;Ultrasound;Gait training;Stair training;Functional mobility training;Therapeutic activities;Therapeutic exercise;Balance training;Neuromuscular re-education;Patient/family education;Manual techniques;Scar mobilization;Passive range of motion    PT Next Visit Plan knee fleixon ROM, Gait , quadriceps strength, stairs    PT Home Exercise Plan Access Code: [email or web address]    Consulted and Agree with Plan of Care Patient           Patient will benefit from skilled therapeutic intervention in order to improve the following deficits and impairments:  Postural dysfunction,Decreased mobility,Decreased strength,Increased edema,Impaired flexibility,Decreased balance,Decreased activity tolerance,Pain,Obesity,Difficulty walking  Visit Diagnosis: Difficulty in walking, not elsewhere classified  Muscle weakness (generalized)  Stiffness of left knee, not elsewhere classified  Localized edema  Chronic pain of left knee  Acute pain of left knee     Problem List Patient Active Problem List   Diagnosis Date Noted  . Acute on chronic diastolic CHF (congestive heart failure) ([place]) 08/24/2020  . Acute cholecystitis 08/23/2020  . Elevated troponin 08/23/2020  . Arthritis of left knee 05/13/2020  . S/P total knee arthroplasty, left 05/13/2020  . History of total knee arthroplasty, right 03/31/2020  . Precordial pain   . Chest pain 08/13/2017  . Mixed dyslipidemia 08/13/2017  . CAD (coronary artery disease), native coronary artery 08/13/2017  . Mild episode of recurrent major  depressive disorder ([place]) 11/03/2013  . Urge incontinence 04/02/2013  . Spinal stenosis, lumbar region, without neurogenic claudication 10/27/2012  . Spinal stenosis of lumbar region 10/01/2012  . Allergic rhinitis 09/26/2011  . Dizziness 01/23/2011  . Dyspnea 01/23/2011  . Type II diabetes mellitus ([place]) 06/27/2010  . Morbid obesity ([place]) 06/27/2010  . Impaired glucose tolerance 06/27/2010  . Disorder of bone and cartilage 06/22/2010  . Primary cardiomyopathy ([place]) 03/23/2010  . HYPERLIPIDEMIA-MIXED 03/07/2010  . Essential hypertension 03/07/2010  . CARDIOMEGALY 11/30/2009  . OTHER DYSPNEA AND RESPIRATORY ABNORMALITIES 11/30/2009  . AORTIC REGURGITATION 09/28/2009  . Chronic diastolic heart failure ([place]) 09/28/2009  . Aortic regurgitation 09/28/2009  . 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 Osteoarthrosis involving multiple sites 10/26/2008    Debbe Odea, PT,DPT 09/08/2020, 4:04 PM  [facility] [address] [place], [place], [address] Phone: [phone]   Fax:  [phone]  Name: Julie Graves MRN: [id number] Date of Birth: 1940/10/22

## 2020-09-14 ENCOUNTER — Encounter: Payer: Medicare Other | Admitting: Rehabilitative and Restorative Service Providers"

## 2020-09-16 ENCOUNTER — Encounter: Payer: Self-pay | Admitting: Rehabilitative and Restorative Service Providers"

## 2020-09-16 ENCOUNTER — Other Ambulatory Visit: Payer: Self-pay

## 2020-09-16 ENCOUNTER — Ambulatory Visit: Payer: Medicare Other | Admitting: Rehabilitative and Restorative Service Providers"

## 2020-09-16 DIAGNOSIS — M25562 Pain in left knee: Secondary | ICD-10-CM

## 2020-09-16 DIAGNOSIS — M6281 Muscle weakness (generalized): Secondary | ICD-10-CM | POA: Diagnosis not present

## 2020-09-16 DIAGNOSIS — M25662 Stiffness of left knee, not elsewhere classified: Secondary | ICD-10-CM | POA: Diagnosis not present

## 2020-09-16 DIAGNOSIS — R262 Difficulty in walking, not elsewhere classified: Secondary | ICD-10-CM

## 2020-09-16 DIAGNOSIS — R600 Localized edema: Secondary | ICD-10-CM

## 2020-09-16 DIAGNOSIS — G8929 Other chronic pain: Secondary | ICD-10-CM

## 2020-09-16 NOTE — Patient Instructions (Signed)
Access Code: [email or web address] URL: [email or web address] Date: 09/16/2020 Prepared by: Vista Mink  Exercises Supine Bridge - 3 x daily - 7 x weekly - 2 sets - 10 reps Supine Active Straight Leg Raise - 3 x daily - 7 x weekly - 2 sets - 10 reps Supine Knee Extension Strengthening - 3 x daily - 7 x weekly - 2 sets - 10 reps - 5 seconds hold Supine Heel Slide with Strap - 3 x daily - 7 x weekly - 2 sets - 10 reps - 5-10 seconds hold Quad Setting and Stretching - 3 x daily - 7 x weekly - 2 sets - 10 reps - 5 min propping quad set 5 seconds hold Seated Knee Flexion Extension AROM - 3 x daily - 7 x weekly - 2 sets - 10 reps - 5 seconds hold Seated Hamstring Stretch - 2 x daily - 7 x weekly - 1 sets - 3 reps - 20-30 seconds hold Seated Gastroc Stretch with Strap - 2 x daily - 7 x weekly - 1 sets - 3 reps - 20-30 seconds hold Sit to Stand with Armchair - 2 x daily - 7 x weekly - 1 sets - 10 reps Standing Hip Hiking - 2 x daily - 7 x weekly - 2 sets - 10 reps - 3 seconds hold

## 2020-09-16 NOTE — Therapy (Signed)
[facility] [address] [place], [place], [address] Phone: [phone]   Fax:  [phone]  Physical Therapy Treatment/Reassessment Patient Details  Name: Julie Graves MRN: [id number] Date of Birth: [birth year] Referring Provider (PT): Rodell Perna   Encounter Date: 09/16/2020   PT End of Session - 09/16/20 1627    Visit Number 15    Number of Visits 21    Date for PT Re-Evaluation 10/04/20    Authorization Type UHC MCR    Progress Note Due on Visit 23    PT Start Time 1315    PT Stop Time 1408    PT Time Calculation (min) 53 min    Activity Tolerance Patient tolerated treatment well;No increased pain;Patient limited by fatigue    Behavior During Therapy [facility] for tasks assessed/performed           Past Medical History:  Diagnosis Date  . Anginal pain ([place])    "small pain?nerves in chest"started 10/05/13   . Anginal pain ([place])    cleared by cardiology 3/15  . Anxiety    rx given recent not taken yet  . Aortic insufficiency    mild on 9/11 cath  . CAD (coronary artery disease)    nonobstructive let heart cath 3/11: 40% ostial D1, 30% mid CFX  . CHF (congestive heart failure) ([place])   . Colon polyps 02/01/2009    MULTIPLE FRAGMENTS OF TUBULAR ADENOMAS  . Diabetes mellitus   . Frozen shoulder   . GERD (gastroesophageal reflux disease)    occ  . History of blood transfusion    pregnancy  . HLD (hyperlipidemia)   . HTN (hypertension)    ACEI cough  . Hx of cardiovascular stress test    Lexiscan Myoview (10/2013):  Fixed ant defect most c/w breast attenuation, cannot exclude apical infarct; no ischemia, EF 46%; Low Risk  . Nonischemic cardiomyopathy ([place])    Ech 3/11 difficult study, moderate aortic insuficiency noted. Left evntriculogram 3/11  . Obese   . Osteoarthritis   . Palpitation    PACs noted on telemetry while in the hospital  . Pneumonia    hx  . Stress incontinence     Past Surgical History:  Procedure  Laterality Date  . ABDOMINAL HYSTERECTOMY    . APPENDECTOMY    . BLADDER NECK SUSPENSION  85  . CARDIAC CATHETERIZATION    . CHOLECYSTECTOMY N/A 08/24/2020   Procedure: LAPAROSCOPIC CHOLECYSTECTOMY;  Surgeon: Erroll Luna, MD;  [facility];  Service: General;  Laterality: N/A;  . EYE SURGERY    . HAND SURGERY     right "had knots cut out"  . KNEE ARTHROPLASTY Right 12/09/2013   Procedure: COMPUTER ASSISTED Right TOTAL KNEE ARTHROPLASTY;  Surgeon: Marybelle Killings, MD;  Location: [place];  Service: Orthopedics;  Laterality: Right;  . LEFT HEART CATH AND CORONARY ANGIOGRAPHY N/A 08/15/2017   Procedure: LEFT HEART CATH AND CORONARY ANGIOGRAPHY;  Surgeon: Jettie Booze, MD;  Location: [place] [facility];  Service: Cardiovascular;  Laterality: N/A;  . TOTAL KNEE ARTHROPLASTY Left 05/13/2020   Procedure: LEFT TOTAL KNEE ARTHROPLASTY;  Surgeon: Marybelle Killings, MD;  Location: [place];  Service: Orthopedics;  Laterality: Left;  . TUBAL LIGATION      There were no vitals filed for this visit.   Subjective Assessment - 09/16/20 1620    Subjective Julie Graves would like to be able to walk better without her cane.  She feels as if the 3 PT visits over the  past month were not enough due to her medical set back (gall bladder removal).    Pertinent History CAD, HTN, DM aortic insuffiency, hyperlipidemia OA, obesity, anxiety, h/o pneumonia, R TKA 2015, hysterectomy, heart cath angiograph 2019.    Limitations Standing;Walking    How long can you stand comfortably? < 10 minutes    How long can you walk comfortably? 5 minutes    Diagnostic tests x-ray    Patient Stated Goals To be able to go up and down a flight of stairs.  Walk without "relying" on the cane.    Currently in Pain? Yes    Pain Score 4     Pain Location Knee    Pain Orientation Left    Pain Descriptors / Indicators Aching;Tightness    Pain Type Chronic pain;Surgical pain    Pain Onset More than a month ago    Pain Frequency Intermittent     Aggravating Factors  Too much WB and prolonged postures    Pain Relieving Factors Ice and elevation    Effect of Pain on Daily Activities Poor WB endurance, apprehension with stairs, poor strength    Multiple Pain Sites No              [facility] PT Assessment - 09/16/20 0001      Observation/Other Assessments   Focus on Therapeutic Outcomes (FOTO)  51 (Goal 57)      AROM   Left Knee Extension 0    Left Knee Flexion 103                         [facility] Adult PT Treatment/Exercise - 09/16/20 0001      Therapeutic Activites    Therapeutic Activities ADL's    ADL's Step-up and over 6 inch step with handrail and slow eccentrics (avoid circumduction)      Neuro Re-ed    Neuro Re-ed Details  Wide heel to toe balance 4X 20 seconds B and dynamic HT laps 4X      Exercises   Exercises Knee/Hip      Knee/Hip Exercises: Aerobic   Recumbent Bike Seat 3 full revolutions      Knee/Hip Exercises: Machines for Strengthening   Total Gym Leg Press Double leg 75# and L only 37# slow eccentrics 20X each      Knee/Hip Exercises: Standing   Other Standing Knee Exercises Alternating hip hike 2 sets of 10 for 3 seconds      Knee/Hip Exercises: Seated   Sit to Sand 10 reps;without UE support   slow eccentrics     Vasopneumatic   Number Minutes Vasopneumatic  10 minutes    Vasopnuematic Location  Knee    Vasopneumatic Pressure Medium    Vasopneumatic Temperature  34                  PT Education - 09/16/20 1625    Education Details Reviewed exam findings.  Updated and reviewed HEP.    Person(s) Educated Patient    Methods Explanation;Demonstration;Verbal cues;Handout    Comprehension Verbalized understanding;Returned demonstration;Need further instruction;Verbal cues required            PT Short Term Goals - 09/16/20 1625      PT SHORT TERM GOAL #1   Title Pt will be independent in her initial HEP.    Time 3    Period Weeks    Status Achieved    Target Date  07/29/20  PT SHORT TERM GOAL #2   Title Pt will be able to perform sit to stand with no UE support    Time 3    Period Weeks    Status Achieved    Target Date 07/29/20             PT Long Term Goals - 09/16/20 1625      PT LONG TERM GOAL #1   Title independent with HEP and progression    Time 8    Period Weeks    Status On-going      PT LONG TERM GOAL #2   Title Pt will be albe to amb with no assistive device >/= 500 feet with pain </= 2/10.    Baseline amb now with [facility] for short distances    Time 8    Period Weeks    Status On-going      PT LONG TERM GOAL #3   Title improve strength in RLE to at least 4/5 knee flex/ext for improved mobility.    Time 8    Period Weeks    Status On-going      PT LONG TERM GOAL #4   Title Pt will be albe to improve her L knee active  flexion to 100 degrees for improved mobility and gait.    Baseline 90 AROM 3/15    Time 8    Period Weeks    Status Achieved      PT LONG TERM GOAL #5   Title Pt will improve her FOTO score to >/= 57% function.    Baseline 38% on 07/05/2020, 59 on 08/18/2020, 51 on 09/16/2020 (only 3 visits in the last month due to a medical emergency).    Time 8    Period Weeks    Status On-going                 Plan - 09/16/20 1628    Clinical Impression Statement AROM is good (0-103 degrees).  Strength is less than last RA due to only 3 PT visits over the past month and an emergency gall bladder removal that resulted in a 4 day hospital stay.  With return to a normal PT schedule 2X/week for 4 weeks, I expect Julie Graves to meet LTGs and transfer into independent rehabilitation.    Personal Factors and Comorbidities Comorbidity 3+    Comorbidities R TKA, CHF, anxiety, CAD, DM, HTN, OA, obesity, aortic insufficiency,    Examination-Activity Limitations Lift;Transfers;Stairs;Stand    Examination-Participation Restrictions Church;Other    Stability/Clinical Decision Making Stable/Uncomplicated    Rehab Potential  Good    PT Frequency 2x / week    PT Duration 4 weeks    PT Treatment/Interventions ADLs/Self Care Home Management;Cryotherapy;Electrical Stimulation;Ultrasound;Gait training;Stair training;Functional mobility training;Therapeutic activities;Therapeutic exercise;Balance training;Neuromuscular re-education;Patient/family education;Manual techniques;Scar mobilization;Passive range of motion;Vasopneumatic Device    PT Next Visit Plan knee fleixon ROM, Gait , quadriceps strength, stairs    PT Home Exercise Plan Access Code: [email or web address]    Consulted and Agree with Plan of Care Patient           Patient will benefit from skilled therapeutic intervention in order to improve the following deficits and impairments:  Postural dysfunction,Decreased mobility,Decreased strength,Increased edema,Impaired flexibility,Decreased balance,Decreased activity tolerance,Pain,Obesity,Difficulty walking  Visit Diagnosis: Difficulty in walking, not elsewhere classified  Muscle weakness (generalized)  Stiffness of left knee, not elsewhere classified  Localized edema  Chronic pain of left knee     Problem List Patient Active Problem List   Diagnosis  Date Noted  . Acute on chronic diastolic CHF (congestive heart failure) ([place]) 08/24/2020  . Acute cholecystitis 08/23/2020  . Elevated troponin 08/23/2020  . Arthritis of left knee 05/13/2020  . S/P total knee arthroplasty, left 05/13/2020  . History of total knee arthroplasty, right 03/31/2020  . Precordial pain   . Chest pain 08/13/2017  . Mixed dyslipidemia 08/13/2017  . CAD (coronary artery disease), native coronary artery 08/13/2017  . Mild episode of recurrent major depressive disorder ([place]) 11/03/2013  . Urge incontinence 04/02/2013  . Spinal stenosis, lumbar region, without neurogenic claudication 10/27/2012  . Spinal stenosis of lumbar region 10/01/2012  . Allergic rhinitis 09/26/2011  . Dizziness 01/23/2011  . Dyspnea 01/23/2011  . Type II  diabetes mellitus ([place]) 06/27/2010  . Morbid obesity ([place]) 06/27/2010  . Impaired glucose tolerance 06/27/2010  . Disorder of bone and cartilage 06/22/2010  . Primary cardiomyopathy ([place]) 03/23/2010  . HYPERLIPIDEMIA-MIXED 03/07/2010  . Essential hypertension 03/07/2010  . CARDIOMEGALY 11/30/2009  . OTHER DYSPNEA AND RESPIRATORY ABNORMALITIES 11/30/2009  . AORTIC REGURGITATION 09/28/2009  . Chronic diastolic heart failure ([place]) 09/28/2009  . Aortic regurgitation 09/28/2009  . 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 Osteoarthrosis involving multiple sites 10/26/2008    Farley Ly PT, MPT 09/16/2020, 4:32 PM  [facility] Physical Therapy [address] [place], [place], [address] Phone: [phone]   Fax:  [phone]  Name: Julie Graves MRN: [id number] Date of Birth: November 23, 1940

## 2020-09-22 ENCOUNTER — Other Ambulatory Visit: Payer: Self-pay

## 2020-09-22 ENCOUNTER — Encounter: Payer: Self-pay | Admitting: Physical Therapy

## 2020-09-22 ENCOUNTER — Ambulatory Visit (INDEPENDENT_AMBULATORY_CARE_PROVIDER_SITE_OTHER): Payer: Medicare Other | Admitting: Physical Therapy

## 2020-09-22 DIAGNOSIS — R262 Difficulty in walking, not elsewhere classified: Secondary | ICD-10-CM

## 2020-09-22 DIAGNOSIS — R600 Localized edema: Secondary | ICD-10-CM | POA: Diagnosis not present

## 2020-09-22 DIAGNOSIS — M6281 Muscle weakness (generalized): Secondary | ICD-10-CM | POA: Diagnosis not present

## 2020-09-22 DIAGNOSIS — M25662 Stiffness of left knee, not elsewhere classified: Secondary | ICD-10-CM | POA: Diagnosis not present

## 2020-09-22 DIAGNOSIS — G8929 Other chronic pain: Secondary | ICD-10-CM

## 2020-09-22 DIAGNOSIS — M25562 Pain in left knee: Secondary | ICD-10-CM

## 2020-09-22 NOTE — Therapy (Signed)
[place], [place], [address] Phone: [phone]   Fax:  [phone]  Physical Therapy Treatment  Patient Details  Name: Julie Graves MRN: [id number] Date of Birth: 1941/01/15 Referring Provider (PT): Rodell Perna   Encounter Date: 09/22/2020   PT End of Session - 09/22/20 1511    Visit Number 16    Number of Visits 21    Date for PT Re-Evaluation 10/04/20    Authorization Type UHC MCR    Progress Note Due on Visit 23    PT Start Time 1430    PT Stop Time 1511    PT Time Calculation (min) 41 min    Activity Tolerance Patient tolerated treatment well;No increased pain;Patient limited by fatigue    Behavior During Therapy [facility] for tasks assessed/performed           Past Medical History:  Diagnosis Date  . Anginal pain ([place])    "small pain?nerves in chest"started 10/05/13   . Anginal pain ([place])    cleared by cardiology 3/15  . Anxiety    rx given recent not taken yet  . Aortic insufficiency    mild on 9/11 cath  . CAD (coronary artery disease)    nonobstructive let heart cath 3/11: 40% ostial D1, 30% mid CFX  . CHF (congestive heart failure) ([place])   . Colon polyps 02/01/2009    MULTIPLE FRAGMENTS OF TUBULAR ADENOMAS  . Diabetes mellitus   . Frozen shoulder   . GERD (gastroesophageal reflux disease)    occ  . History of blood transfusion    pregnancy  . HLD (hyperlipidemia)   . HTN (hypertension)    ACEI cough  . Hx of cardiovascular stress test    Lexiscan Myoview (10/2013):  Fixed ant defect most c/w breast attenuation, cannot exclude apical infarct; no ischemia, EF 46%; Low Risk  . Nonischemic cardiomyopathy ([place])    Ech 3/11 difficult study, moderate aortic insuficiency noted. Left evntriculogram 3/11  . Obese   . Osteoarthritis   . Palpitation    PACs noted on telemetry while in the hospital  . Pneumonia    hx  . Stress incontinence     Past Surgical History:  Procedure Laterality Date   . ABDOMINAL HYSTERECTOMY    . APPENDECTOMY    . BLADDER NECK SUSPENSION  85  . CARDIAC CATHETERIZATION    . CHOLECYSTECTOMY N/A 08/24/2020   Procedure: LAPAROSCOPIC CHOLECYSTECTOMY;  Surgeon: Erroll Luna, MD;  Location: [facility];  Service: General;  Laterality: N/A;  . EYE SURGERY    . HAND SURGERY     right "had knots cut out"  . KNEE ARTHROPLASTY Right 12/09/2013   Procedure: COMPUTER ASSISTED Right TOTAL KNEE ARTHROPLASTY;  Surgeon: Marybelle Killings, MD;  Location: [place];  Service: Orthopedics;  Laterality: Right;  . LEFT HEART CATH AND CORONARY ANGIOGRAPHY N/A 08/15/2017   Procedure: LEFT HEART CATH AND CORONARY ANGIOGRAPHY;  Surgeon: Jettie Booze, MD;  Location: [place] [facility];  Service: Cardiovascular;  Laterality: N/A;  . TOTAL KNEE ARTHROPLASTY Left 05/13/2020   Procedure: LEFT TOTAL KNEE ARTHROPLASTY;  Surgeon: Marybelle Killings, MD;  Location: [place];  Service: Orthopedics;  Laterality: Left;  . TUBAL LIGATION      There were no vitals filed for this visit.   Subjective Assessment - 09/22/20 1449    Subjective she says her knee is overal doing ok but still with some pain and stiffness with limited ability for walking long.  Limitations Standing;Walking    How long can you stand comfortably? < 10 minutes    How long can you walk comfortably? 5 minutes    Diagnostic tests x-ray    Patient Stated Goals To be able to go up and down a flight of stairs.  Walk without "relying" on the cane.    Pain Onset More than a month ago              [facility] PT Assessment - 09/22/20 0001      Assessment   Medical Diagnosis Lt TKA            [facility] Adult PT Treatment/Exercise - 09/22/20 0001      Knee/Hip Exercises: Stretches   Knee: Self-Stretch Limitations standing lunge stretch 10 sec X 10 with foot on 6 inch step      Knee/Hip Exercises: Aerobic   Nustep seat 5, L5 for 10 min      Knee/Hip Exercises: Machines for Strengthening   Total Gym Leg Press Double leg 75#  2X10and L only 37# slow eccentrics 2X10      Knee/Hip Exercises: Standing   Other Standing Knee Exercises lateral walkin no UE support in bars 3 round trip, step ups in bars with bilat UE support 6 inch step X 10 bilat    Other Standing Knee Exercises tandem balance 20 sec X 4 reps with intermit UE support PRN      Knee/Hip Exercises: Seated   Sit to Sand 10 reps   using arms to stand up and no arms to sit down with slow eccentrics           PT Short Term Goals - 09/16/20 1625      PT SHORT TERM GOAL #1   Title Pt will be independent in her initial HEP.    Time 3    Period Weeks    Status Achieved    Target Date 07/29/20      PT SHORT TERM GOAL #2   Title Pt will be able to perform sit to stand with no UE support    Time 3    Period Weeks    Status Achieved    Target Date 07/29/20             PT Long Term Goals - 09/16/20 1625      PT LONG TERM GOAL #1   Title independent with HEP and progression    Time 8    Period Weeks    Status On-going      PT LONG TERM GOAL #2   Title Pt will be albe to amb with no assistive device >/= 500 feet with pain </= 2/10.    Baseline amb now with [facility] for short distances    Time 8    Period Weeks    Status On-going      PT LONG TERM GOAL #3   Title improve strength in RLE to at least 4/5 knee flex/ext for improved mobility.    Time 8    Period Weeks    Status On-going      PT LONG TERM GOAL #4   Title Pt will be albe to improve her L knee active  flexion to 100 degrees for improved mobility and gait.    Baseline 90 AROM 3/15    Time 8    Period Weeks    Status Achieved      PT LONG TERM GOAL #5   Title Pt will improve her  FOTO score to >/= 57% function.    Baseline 38% on 07/05/2020, 59 on 08/18/2020, 51 on 09/16/2020 (only 3 visits in the last month due to a medical emergency).    Time 8    Period Weeks    Status On-going                 Plan - 09/22/20 1512    Clinical Impression Statement Session focused on  strength progression and activity tolerance as tolerated. PT will continue to gradually progress this as able.    Personal Factors and Comorbidities Comorbidity 3+    Comorbidities R TKA, CHF, anxiety, CAD, DM, HTN, OA, obesity, aortic insufficiency,    Examination-Activity Limitations Lift;Transfers;Stairs;Stand    Examination-Participation Restrictions Church;Other    Stability/Clinical Decision Making Stable/Uncomplicated    Rehab Potential Good    PT Frequency 2x / week    PT Duration 4 weeks    PT Treatment/Interventions ADLs/Self Care Home Management;Cryotherapy;Electrical Stimulation;Ultrasound;Gait training;Stair training;Functional mobility training;Therapeutic activities;Therapeutic exercise;Balance training;Neuromuscular re-education;Patient/family education;Manual techniques;Scar mobilization;Passive range of motion;Vasopneumatic Device    PT Next Visit Plan knee fleixon ROM, Gait , quadriceps strength, stairs    PT Home Exercise Plan Access Code: [email or web address]    Consulted and Agree with Plan of Care Patient           Patient will benefit from skilled therapeutic intervention in order to improve the following deficits and impairments:  Postural dysfunction,Decreased mobility,Decreased strength,Increased edema,Impaired flexibility,Decreased balance,Decreased activity tolerance,Pain,Obesity,Difficulty walking  Visit Diagnosis: Difficulty in walking, not elsewhere classified  Muscle weakness (generalized)  Stiffness of left knee, not elsewhere classified  Localized edema  Chronic pain of left knee     Problem List Patient Active Problem List   Diagnosis Date Noted  . Acute on chronic diastolic CHF (congestive heart failure) ([place]) 08/24/2020  . Acute cholecystitis 08/23/2020  . Elevated troponin 08/23/2020  . Arthritis of left knee 05/13/2020  . S/P total knee arthroplasty, left 05/13/2020  . History of total knee arthroplasty, right 03/31/2020  . Precordial pain   .  Chest pain 08/13/2017  . Mixed dyslipidemia 08/13/2017  . CAD (coronary artery disease), native coronary artery 08/13/2017  . Mild episode of recurrent major depressive disorder ([place]) 11/03/2013  . Urge incontinence 04/02/2013  . Spinal stenosis, lumbar region, without neurogenic claudication 10/27/2012  . Spinal stenosis of lumbar region 10/01/2012  . Allergic rhinitis 09/26/2011  . Dizziness 01/23/2011  . Dyspnea 01/23/2011  . Type II diabetes mellitus ([place]) 06/27/2010  . Morbid obesity ([place]) 06/27/2010  . Impaired glucose tolerance 06/27/2010  . Disorder of bone and cartilage 06/22/2010  . Primary cardiomyopathy ([place]) 03/23/2010  . HYPERLIPIDEMIA-MIXED 03/07/2010  . Essential hypertension 03/07/2010  . CARDIOMEGALY 11/30/2009  . OTHER DYSPNEA AND RESPIRATORY ABNORMALITIES 11/30/2009  . AORTIC REGURGITATION 09/28/2009  . Chronic diastolic heart failure ([place]) 09/28/2009  . Aortic regurgitation 09/28/2009  . 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 Osteoarthrosis involving multiple sites 10/26/2008    Julie Graves 09/22/2020, 3:13 PM  [facility] [address] [place], [place], [address] Phone: [phone]   Fax:  [phone]  Name: Julie Graves MRN: [id number] Date of Birth: 01-Sep-1940

## 2020-09-27 ENCOUNTER — Ambulatory Visit: Payer: Medicare Other | Admitting: Orthopaedic Surgery

## 2020-09-27 ENCOUNTER — Encounter: Payer: Self-pay | Admitting: Orthopaedic Surgery

## 2020-09-27 ENCOUNTER — Other Ambulatory Visit: Payer: Self-pay

## 2020-09-27 VITALS — BP 121/73 | HR 78 | Ht 60.0 in | Wt 227.0 lb

## 2020-09-27 DIAGNOSIS — Z96652 Presence of left artificial knee joint: Secondary | ICD-10-CM | POA: Diagnosis not present

## 2020-09-27 NOTE — Progress Notes (Signed)
Office Visit Note   Patient: Julie Graves           Date of Birth: Jul 06, 1940           MRN: [id number] Visit Date: 09/27/2020              Requested by: Harrison Mons, [place],  [place] [address] PCP: Harrison Mons, PA   Assessment & Plan: Visit Diagnoses:  1. S/P total knee arthroplasty, left            With residual quad weakness  Plan: Patient will continue to work on quad strengthening.  Her therapy is starting back up since it had to be delayed due to her emergent gallbladder surgery.  I can check her back again as needed and she will continue to work hard on quad strengthening to improve her gait.  Follow-Up Instructions: Return if symptoms worsen or fail to improve.   Orders:  No orders of the defined types were placed in this encounter.  No orders of the defined types were placed in this encounter.     Procedures: No procedures performed   Clinical Data: No additional findings.   Subjective: Chief Complaint  Patient presents with  . Left Knee - Follow-up    05/13/2020 Left TKA    HPI [age]-year-old female returns post total knee arthroplasty 11/90/21.  She has had excellent flexion extremities had problems with quad weakness.  She still has a hop type gait and uses a cane.  She recently had problems with her gallbladder and had to have urgent or emergent gallbladder surgery was in the hospital for 4 days.  She lost weight after total knee arthroplasty went from 222 down to 195.  She still has problems with bilateral pitting edema and is using support teds that are thigh-high but she rolls him down to just above the patella.  She did not wear them today and does have pitting edema bilaterally.  She is trying to get ready to walk down the aisle with her son gets married in the coming month.  Review of Systems all the systems noncontributory to HPI and updated.   Objective: Vital Signs: BP 121/73   Pulse 78   Ht 5'  (1.524 m)   Wt 227 lb (103 kg)   BMI 44.33 kg/m   Physical Exam Constitutional:      Appearance: She is well-developed.  HENT:     Head: Normocephalic.     Right Ear: External ear normal.     Left Ear: External ear normal.  Eyes:     Pupils: Pupils are equal, round, and reactive to light.  Neck:     Thyroid: No thyromegaly.     Trachea: No tracheal deviation.  Cardiovascular:     Rate and Rhythm: Normal rate.  Pulmonary:     Effort: Pulmonary effort is normal.  Abdominal:     Palpations: Abdomen is soft.  Skin:    General: Skin is warm and dry.  Neurological:     Mental Status: She is alert and oriented to person, place, and time.  Psychiatric:        Behavior: Behavior normal.     Ortho Exam bilateral pitting edema full extension she takes fair left quad resistance with ankle pressure by hand.  She still has a weak quad gait and hops when she does not use her cane.  Knee flexion 120 degrees collateral ligaments are stable.  Good AP  stability. Specialty Comments:  No specialty comments available.  Imaging: No results found.   PMFS History: Patient Active Problem List   Diagnosis Date Noted  . Acute on chronic diastolic CHF (congestive heart failure) ([place]) 08/24/2020  . Acute cholecystitis 08/23/2020  . Elevated troponin 08/23/2020  . Arthritis of left knee 05/13/2020  . S/P total knee arthroplasty, left 05/13/2020  . History of total knee arthroplasty, right 03/31/2020  . Precordial pain   . Chest pain 08/13/2017  . Mixed dyslipidemia 08/13/2017  . CAD (coronary artery disease), native coronary artery 08/13/2017  . Mild episode of recurrent major depressive disorder ([place]) 11/03/2013  . Urge incontinence 04/02/2013  . Spinal stenosis, lumbar region, without neurogenic claudication 10/27/2012  . Spinal stenosis of lumbar region 10/01/2012  . Allergic rhinitis 09/26/2011  . Dizziness 01/23/2011  . Dyspnea 01/23/2011  . Type II diabetes mellitus ([place])  06/27/2010  . Morbid obesity ([place]) 06/27/2010  . Impaired glucose tolerance 06/27/2010  . Disorder of bone and cartilage 06/22/2010  . Primary cardiomyopathy ([place]) 03/23/2010  . HYPERLIPIDEMIA-MIXED 03/07/2010  . Essential hypertension 03/07/2010  . CARDIOMEGALY 11/30/2009  . OTHER DYSPNEA AND RESPIRATORY ABNORMALITIES 11/30/2009  . AORTIC REGURGITATION 09/28/2009  . Chronic diastolic heart failure ([place]) 09/28/2009  . Aortic regurgitation 09/28/2009  . 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 Osteoarthrosis involving multiple sites 10/26/2008   Past Medical History:  Diagnosis Date  . Anginal pain ([place])    "small pain?nerves in chest"started 10/05/13   . Anginal pain ([place])    cleared by cardiology 3/15  . Anxiety    rx given recent not taken yet  . Aortic insufficiency    mild on 9/11 cath  . CAD (coronary artery disease)    nonobstructive let heart cath 3/11: 40% ostial D1, 30% mid CFX  . CHF (congestive heart failure) ([place])   . Colon polyps 02/01/2009    MULTIPLE FRAGMENTS OF TUBULAR ADENOMAS  . Diabetes mellitus   . Frozen shoulder   . GERD (gastroesophageal reflux disease)    occ  . History of blood transfusion    pregnancy  . HLD (hyperlipidemia)   . HTN (hypertension)    ACEI cough  . Hx of cardiovascular stress test    Lexiscan Myoview (10/2013):  Fixed ant defect most c/w breast attenuation, cannot exclude apical infarct; no ischemia, EF 46%; Low Risk  . Nonischemic cardiomyopathy ([place])    Ech 3/11 difficult study, moderate aortic insuficiency noted. Left evntriculogram 3/11  . Obese   . Osteoarthritis   . Palpitation    PACs noted on telemetry while in the hospital  . Pneumonia    hx  . Stress incontinence     Family History  Problem Relation Age of Onset  . Heart attack Father   . Heart attack Sister   . Stroke Mother 73  . Diabetes Sister   . Heart disease Sister   . Diabetes Brother   . Colon cancer Brother   . Breast cancer Neg Hx     Past  Surgical History:  Procedure Laterality Date  . ABDOMINAL HYSTERECTOMY    . APPENDECTOMY    . BLADDER NECK SUSPENSION  85  . CARDIAC CATHETERIZATION    . CHOLECYSTECTOMY N/A 08/24/2020   Procedure: LAPAROSCOPIC CHOLECYSTECTOMY;  Surgeon: Erroll Luna, MD;  [facility];  Service: General;  Laterality: N/A;  . EYE SURGERY    . HAND SURGERY     right "had knots cut out"  . KNEE ARTHROPLASTY Right 12/09/2013  Procedure: COMPUTER ASSISTED Right TOTAL KNEE ARTHROPLASTY;  Surgeon: Marybelle Killings, MD;  Location: [place];  Service: Orthopedics;  Laterality: Right;  . LEFT HEART CATH AND CORONARY ANGIOGRAPHY N/A 08/15/2017   Procedure: LEFT HEART CATH AND CORONARY ANGIOGRAPHY;  Surgeon: Jettie Booze, MD;  Location: [place] [facility];  Service: Cardiovascular;  Laterality: N/A;  . TOTAL KNEE ARTHROPLASTY Left 05/13/2020   Procedure: LEFT TOTAL KNEE ARTHROPLASTY;  Surgeon: Marybelle Killings, MD;  Location: [place];  Service: Orthopedics;  Laterality: Left;  . TUBAL LIGATION     Social History   Occupational History  . Not on file  Tobacco Use  . Smoking status: Former Smoker    Packs/day: 1.00    Years: 15.00    Pack years: 15.00    Types: Cigarettes    Quit date: 10/10/1983    Years since quitting: 36.9  . Smokeless tobacco: Never Used  . Tobacco comment: Quit 1970s  Vaping Use  . Vaping Use: Never used  Substance and Sexual Activity  . Alcohol use: No    Alcohol/week: 0.0 standard drinks  . Drug use: No  . Sexual activity: Not on file

## 2020-10-04 ENCOUNTER — Encounter: Payer: Self-pay | Admitting: Physical Therapy

## 2020-10-04 ENCOUNTER — Other Ambulatory Visit: Payer: Self-pay

## 2020-10-04 ENCOUNTER — Ambulatory Visit (INDEPENDENT_AMBULATORY_CARE_PROVIDER_SITE_OTHER): Payer: Medicare Other | Admitting: Physical Therapy

## 2020-10-04 DIAGNOSIS — G8929 Other chronic pain: Secondary | ICD-10-CM

## 2020-10-04 DIAGNOSIS — M6281 Muscle weakness (generalized): Secondary | ICD-10-CM

## 2020-10-04 DIAGNOSIS — M25562 Pain in left knee: Secondary | ICD-10-CM

## 2020-10-04 DIAGNOSIS — R262 Difficulty in walking, not elsewhere classified: Secondary | ICD-10-CM

## 2020-10-04 DIAGNOSIS — M25662 Stiffness of left knee, not elsewhere classified: Secondary | ICD-10-CM

## 2020-10-04 DIAGNOSIS — R600 Localized edema: Secondary | ICD-10-CM | POA: Diagnosis not present

## 2020-10-04 NOTE — Therapy (Signed)
[place], [place], [address] Phone: [phone]   Fax:  [phone]  Physical Therapy Treatment  Patient Details  Name: Julie Graves MRN: [id number] Date of Birth: 06-Aug-1940 Referring Provider (PT): Rodell Perna, MD   Encounter Date: 10/04/2020   PT End of Session - 10/04/20 1524    Visit Number 17    Number of Visits 21    Date for PT Re-Evaluation 10/04/20    Authorization Type UHC MCR    Progress Note Due on Visit 23    PT Start Time 1941    PT Stop Time 1555    PT Time Calculation (min) 40 min    Activity Tolerance Patient tolerated treatment well;No increased pain;Patient limited by fatigue    Behavior During Therapy [facility] for tasks assessed/performed           Past Medical History:  Diagnosis Date  . Anginal pain ([place])    "small pain?nerves in chest"started 10/05/13   . Anginal pain ([place])    cleared by cardiology 3/15  . Anxiety    rx given recent not taken yet  . Aortic insufficiency    mild on 9/11 cath  . CAD (coronary artery disease)    nonobstructive let heart cath 3/11: 40% ostial D1, 30% mid CFX  . CHF (congestive heart failure) ([place])   . Colon polyps 02/01/2009    MULTIPLE FRAGMENTS OF TUBULAR ADENOMAS  . Diabetes mellitus   . Frozen shoulder   . GERD (gastroesophageal reflux disease)    occ  . History of blood transfusion    pregnancy  . HLD (hyperlipidemia)   . HTN (hypertension)    ACEI cough  . Hx of cardiovascular stress test    Lexiscan Myoview (10/2013):  Fixed ant defect most c/w breast attenuation, cannot exclude apical infarct; no ischemia, EF 46%; Low Risk  . Nonischemic cardiomyopathy ([place])    Ech 3/11 difficult study, moderate aortic insuficiency noted. Left evntriculogram 3/11  . Obese   . Osteoarthritis   . Palpitation    PACs noted on telemetry while in the hospital  . Pneumonia    hx  . Stress incontinence     Past Surgical History:  Procedure Laterality  Date  . ABDOMINAL HYSTERECTOMY    . APPENDECTOMY    . BLADDER NECK SUSPENSION  85  . CARDIAC CATHETERIZATION    . CHOLECYSTECTOMY N/A 08/24/2020   Procedure: LAPAROSCOPIC CHOLECYSTECTOMY;  Surgeon: Erroll Luna, MD;  Location: [facility];  Service: General;  Laterality: N/A;  . EYE SURGERY    . HAND SURGERY     right "had knots cut out"  . KNEE ARTHROPLASTY Right 12/09/2013   Procedure: COMPUTER ASSISTED Right TOTAL KNEE ARTHROPLASTY;  Surgeon: Marybelle Killings, MD;  Location: [place];  Service: Orthopedics;  Laterality: Right;  . LEFT HEART CATH AND CORONARY ANGIOGRAPHY N/A 08/15/2017   Procedure: LEFT HEART CATH AND CORONARY ANGIOGRAPHY;  Surgeon: Jettie Booze, MD;  Location: [place] [facility];  Service: Cardiovascular;  Laterality: N/A;  . TOTAL KNEE ARTHROPLASTY Left 05/13/2020   Procedure: LEFT TOTAL KNEE ARTHROPLASTY;  Surgeon: Marybelle Killings, MD;  Location: [place];  Service: Orthopedics;  Laterality: Left;  . TUBAL LIGATION      There were no vitals filed for this visit.   Subjective Assessment - 10/04/20 1523    Subjective Pt arriving to therapy today reproting no pain in her knee. Pt did however report frustration with walking having to use  a cane. Pt's ultimate goal is to walk without a device.    Pertinent History CAD, HTN, DM aortic insuffiency, hyperlipidemia OA, obesity, anxiety, h/o pneumonia, R TKA 2015, hysterectomy, heart cath angiograph 2019.    Limitations Standing;Walking    How long can you stand comfortably? < 10 minutes    How long can you walk comfortably? 5 minutes    Patient Stated Goals To be able to go up and down a flight of stairs.  Walk without "relying" on the cane.    Currently in Pain? No/denies              [facility] PT Assessment - 10/04/20 0001      Assessment   Medical Diagnosis left TKA    Referring Provider (PT) Rodell Perna, MD      AROM   Left Knee Extension 0    Left Knee Flexion 105                         [facility] Adult PT  Treatment/Exercise - 10/04/20 0001      Ambulation/Gait   Gait Comments Pt amb with no assistive device with antalgic gait pattern with increased lateral sway using a gait belt 230 feet on level surfaces.      Knee/Hip Exercises: Stretches   Gastroc Stretch Both;60 seconds    Gastroc Stretch Limitations slant board      Knee/Hip Exercises: Aerobic   Nustep seat 5, L5 for 8 min      Knee/Hip Exercises: Machines for Strengthening   Total Gym Leg Press 75# bilateral LE's      Knee/Hip Exercises: Standing   Forward Step Up 10 reps;3 sets;Hand Hold: 1;Step Height: 6"    Forward Step Up Limitations last set of 10 was on 4 inch step due to fatigue    Other Standing Knee Exercises hip flexion x 20 with single UE support, cone tapping with single UE support x 20 reps    Other Standing Knee Exercises tandem balance 20 sec X 4 reps with intermit UE support PRN      Knee/Hip Exercises: Seated   Sit to Sand 15 reps                    PT Short Term Goals - 09/16/20 1625      PT SHORT TERM GOAL #1   Title Pt will be independent in her initial HEP.    Time 3    Period Weeks    Status Achieved    Target Date 07/29/20      PT SHORT TERM GOAL #2   Title Pt will be able to perform sit to stand with no UE support    Time 3    Period Weeks    Status Achieved    Target Date 07/29/20             PT Long Term Goals - 09/16/20 1625      PT LONG TERM GOAL #1   Title independent with HEP and progression    Time 8    Period Weeks    Status On-going      PT LONG TERM GOAL #2   Title Pt will be albe to amb with no assistive device >/= 500 feet with pain </= 2/10.    Baseline amb now with [facility] for short distances    Time 8    Period Weeks    Status On-going  PT LONG TERM GOAL #3   Title improve strength in RLE to at least 4/5 knee flex/ext for improved mobility.    Time 8    Period Weeks    Status On-going      PT LONG TERM GOAL #4   Title Pt will be albe to improve  her L knee active  flexion to 100 degrees for improved mobility and gait.    Baseline 90 AROM 3/15    Time 8    Period Weeks    Status Achieved      PT LONG TERM GOAL #5   Title Pt will improve her FOTO score to >/= 57% function.    Baseline 38% on 07/05/2020, 59 on 08/18/2020, 51 on 09/16/2020 (only 3 visits in the last month due to a medical emergency).    Time 8    Period Weeks    Status On-going                 Plan - 10/04/20 1525    Clinical Impression Statement Pt reporting no pain at beginning of session. Working on Advice worker and gait stratgies. AROM measured today 0-105 degrees.  Continue skilled PT.    Personal Factors and Comorbidities Comorbidity 3+    Comorbidities R TKA, CHF, anxiety, CAD, DM, HTN, OA, obesity, aortic insufficiency,    Examination-Activity Limitations Lift;Transfers;Stairs;Stand    Examination-Participation Restrictions Church;Other    Stability/Clinical Decision Making Stable/Uncomplicated    Rehab Potential Good    PT Frequency 2x / week    PT Duration 4 weeks    PT Treatment/Interventions ADLs/Self Care Home Management;Cryotherapy;Electrical Stimulation;Ultrasound;Gait training;Stair training;Functional mobility training;Therapeutic activities;Therapeutic exercise;Balance training;Neuromuscular re-education;Patient/family education;Manual techniques;Scar mobilization;Passive range of motion;Vasopneumatic Device    PT Next Visit Plan knee fleixon ROM, Gait , quadriceps strength, stairs    PT Home Exercise Plan Access Code: [email or web address]    Consulted and Agree with Plan of Care Patient           Patient will benefit from skilled therapeutic intervention in order to improve the following deficits and impairments:  Postural dysfunction,Decreased mobility,Decreased strength,Increased edema,Impaired flexibility,Decreased balance,Decreased activity tolerance,Pain,Obesity,Difficulty walking  Visit Diagnosis: Difficulty in walking,  not elsewhere classified  Muscle weakness (generalized)  Stiffness of left knee, not elsewhere classified  Localized edema  Chronic pain of left knee  Acute pain of left knee     Problem List Patient Active Problem List   Diagnosis Date Noted  . Acute on chronic diastolic CHF (congestive heart failure) ([place]) 08/24/2020  . Acute cholecystitis 08/23/2020  . Elevated troponin 08/23/2020  . Arthritis of left knee 05/13/2020  . S/P total knee arthroplasty, left 05/13/2020  . History of total knee arthroplasty, right 03/31/2020  . Precordial pain   . Chest pain 08/13/2017  . Mixed dyslipidemia 08/13/2017  . CAD (coronary artery disease), native coronary artery 08/13/2017  . Mild episode of recurrent major depressive disorder ([place]) 11/03/2013  . Urge incontinence 04/02/2013  . Spinal stenosis, lumbar region, without neurogenic claudication 10/27/2012  . Spinal stenosis of lumbar region 10/01/2012  . Allergic rhinitis 09/26/2011  . Dizziness 01/23/2011  . Dyspnea 01/23/2011  . Type II diabetes mellitus ([place]) 06/27/2010  . Morbid obesity ([place]) 06/27/2010  . Impaired glucose tolerance 06/27/2010  . Disorder of bone and cartilage 06/22/2010  . Primary cardiomyopathy ([place]) 03/23/2010  . HYPERLIPIDEMIA-MIXED 03/07/2010  . Essential hypertension 03/07/2010  . CARDIOMEGALY 11/30/2009  . OTHER DYSPNEA AND RESPIRATORY ABNORMALITIES 11/30/2009  . AORTIC REGURGITATION 09/28/2009  .  Chronic diastolic heart failure ([place]) 09/28/2009  . Aortic regurgitation 09/28/2009  . 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 Osteoarthrosis involving multiple sites 10/26/2008    Oretha Caprice, PT, MPT 10/04/2020, 4:09 PM  [facility] Physical Therapy [address] [place], [place], [address] Phone: [phone]   Fax:  [phone]  Name: Julie Graves MRN: [id number] Date of Birth: 07/08/1940

## 2020-10-06 ENCOUNTER — Ambulatory Visit (INDEPENDENT_AMBULATORY_CARE_PROVIDER_SITE_OTHER): Payer: Medicare Other | Admitting: Physical Therapy

## 2020-10-06 ENCOUNTER — Other Ambulatory Visit: Payer: Self-pay

## 2020-10-06 DIAGNOSIS — M6281 Muscle weakness (generalized): Secondary | ICD-10-CM | POA: Diagnosis not present

## 2020-10-06 DIAGNOSIS — R262 Difficulty in walking, not elsewhere classified: Secondary | ICD-10-CM | POA: Diagnosis not present

## 2020-10-06 DIAGNOSIS — M25562 Pain in left knee: Secondary | ICD-10-CM

## 2020-10-06 DIAGNOSIS — R600 Localized edema: Secondary | ICD-10-CM | POA: Diagnosis not present

## 2020-10-06 DIAGNOSIS — M25662 Stiffness of left knee, not elsewhere classified: Secondary | ICD-10-CM

## 2020-10-06 DIAGNOSIS — G8929 Other chronic pain: Secondary | ICD-10-CM

## 2020-10-06 NOTE — Therapy (Signed)
[place], [place], [address] Phone: [phone]   Fax:  [phone]  Physical Therapy Treatment  Patient Details  Name: Julie Graves MRN: [id number] Date of Birth: September 21, 1940 Referring Provider (PT): Rodell Perna, MD   Encounter Date: 10/06/2020   PT End of Session - 10/06/20 1558    Visit Number 18    Number of Visits 21    Date for PT Re-Evaluation 10/04/20    Authorization Type UHC MCR    Progress Note Due on Visit 23    PT Start Time 1505    PT Stop Time 1555    PT Time Calculation (min) 40 min    Activity Tolerance Patient tolerated treatment well;No increased pain;Patient limited by fatigue    Behavior During Therapy [facility] for tasks assessed/performed           Past Medical History:  Diagnosis Date  . Anginal pain ([place])    "small pain?nerves in chest"started 10/05/13   . Anginal pain ([place])    cleared by cardiology 3/15  . Anxiety    rx given recent not taken yet  . Aortic insufficiency    mild on 9/11 cath  . CAD (coronary artery disease)    nonobstructive let heart cath 3/11: 40% ostial D1, 30% mid CFX  . CHF (congestive heart failure) ([place])   . Colon polyps 02/01/2009    MULTIPLE FRAGMENTS OF TUBULAR ADENOMAS  . Diabetes mellitus   . Frozen shoulder   . GERD (gastroesophageal reflux disease)    occ  . History of blood transfusion    pregnancy  . HLD (hyperlipidemia)   . HTN (hypertension)    ACEI cough  . Hx of cardiovascular stress test    Lexiscan Myoview (10/2013):  Fixed ant defect most c/w breast attenuation, cannot exclude apical infarct; no ischemia, EF 46%; Low Risk  . Nonischemic cardiomyopathy ([place])    Ech 3/11 difficult study, moderate aortic insuficiency noted. Left evntriculogram 3/11  . Obese   . Osteoarthritis   . Palpitation    PACs noted on telemetry while in the hospital  . Pneumonia    hx  . Stress incontinence     Past Surgical History:  Procedure Laterality  Date  . ABDOMINAL HYSTERECTOMY    . APPENDECTOMY    . BLADDER NECK SUSPENSION  85  . CARDIAC CATHETERIZATION    . CHOLECYSTECTOMY N/A 08/24/2020   Procedure: LAPAROSCOPIC CHOLECYSTECTOMY;  Surgeon: Erroll Luna, MD;  [facility];  Service: General;  Laterality: N/A;  . EYE SURGERY    . HAND SURGERY     right "had knots cut out"  . KNEE ARTHROPLASTY Right 12/09/2013   Procedure: COMPUTER ASSISTED Right TOTAL KNEE ARTHROPLASTY;  Surgeon: Marybelle Killings, MD;  Location: [place];  Service: Orthopedics;  Laterality: Right;  . LEFT HEART CATH AND CORONARY ANGIOGRAPHY N/A 08/15/2017   Procedure: LEFT HEART CATH AND CORONARY ANGIOGRAPHY;  Surgeon: Jettie Booze, MD;  Location: [place] [facility];  Service: Cardiovascular;  Laterality: N/A;  . TOTAL KNEE ARTHROPLASTY Left 05/13/2020   Procedure: LEFT TOTAL KNEE ARTHROPLASTY;  Surgeon: Marybelle Killings, MD;  Location: [place];  Service: Orthopedics;  Laterality: Left;  . TUBAL LIGATION      There were no vitals filed for this visit.   Subjective Assessment - 10/06/20 1558    Subjective Pt arriving to therapy today reporting no pain, she just feels tired. She is trying to walk more at home without  AD.    Pertinent History CAD, HTN, DM aortic insuffiency, hyperlipidemia OA, obesity, anxiety, h/o pneumonia, R TKA 2015, hysterectomy, heart cath angiograph 2019.    Limitations Standing;Walking    How long can you stand comfortably? < 10 minutes    How long can you walk comfortably? 5 minutes    Diagnostic tests x-ray    Patient Stated Goals To be able to go up and down a flight of stairs.  Walk without "relying" on the cane.    Pain Onset More than a month ago                [facility] Adult PT Treatment/Exercise - 10/06/20 0001      Ambulation/Gait   Gait Comments Pt amb with no assistive device with antalgic gait pattern with increased lateral sway SBA, 75 ft X2      Knee/Hip Exercises: Stretches   Knee: Self-Stretch Limitations seated  tailgate stretch 10 sec X10      Knee/Hip Exercises: Aerobic   Nustep seat 5, L5 for 8 min      Knee/Hip Exercises: Machines for Strengthening   Total Gym Leg Press 75# bilateral LE's, 3X10      Knee/Hip Exercises: Standing   Forward Step Up Both;10 reps;Hand Hold: 1;Step Height: 6"    Forward Step Up Limitations fatigue and difficulty noted with this    Other Standing Knee Exercises tandem balance 20 sec X 4 reps with intermit UE support PRN      Knee/Hip Exercises: Seated   Long Arc Quad Both;10 reps;3 sets    [employer] Weight 4 lbs.      Manual Therapy   Manual therapy comments Lt knee PROM in sitting                    PT Short Term Goals - 09/16/20 1625      PT SHORT TERM GOAL #1   Title Pt will be independent in her initial HEP.    Time 3    Period Weeks    Status Achieved    Target Date 07/29/20      PT SHORT TERM GOAL #2   Title Pt will be able to perform sit to stand with no UE support    Time 3    Period Weeks    Status Achieved    Target Date 07/29/20             PT Long Term Goals - 09/16/20 1625      PT LONG TERM GOAL #1   Title independent with HEP and progression    Time 8    Period Weeks    Status On-going      PT LONG TERM GOAL #2   Title Pt will be albe to amb with no assistive device >/= 500 feet with pain </= 2/10.    Baseline amb now with [facility] for short distances    Time 8    Period Weeks    Status On-going      PT LONG TERM GOAL #3   Title improve strength in RLE to at least 4/5 knee flex/ext for improved mobility.    Time 8    Period Weeks    Status On-going      PT LONG TERM GOAL #4   Title Pt will be albe to improve her L knee active  flexion to 100 degrees for improved mobility and gait.    Baseline 90 AROM 3/15  Time 8    Period Weeks    Status Achieved      PT LONG TERM GOAL #5   Title Pt will improve her FOTO score to >/= 57% function.    Baseline 38% on 07/05/2020, 59 on 08/18/2020, 51 on 09/16/2020  (only 3 visits in the last month due to a medical emergency).    Time 8    Period Weeks    Status On-going                 Plan - 10/06/20 1559    Clinical Impression Statement Continued to work on her overall leg strength and endurance. She is not having much pain but continues to lack strength and ROM and has difficulty with gait without AD and with stairs. We will continue to work in PT to progress this as tolerated.    Personal Factors and Comorbidities Comorbidity 3+    Comorbidities R TKA, CHF, anxiety, CAD, DM, HTN, OA, obesity, aortic insufficiency,    Examination-Activity Limitations Lift;Transfers;Stairs;Stand    Examination-Participation Restrictions Church;Other    Stability/Clinical Decision Making Stable/Uncomplicated    Rehab Potential Good    PT Frequency 2x / week    PT Duration 4 weeks    PT Treatment/Interventions ADLs/Self Care Home Management;Cryotherapy;Electrical Stimulation;Ultrasound;Gait training;Stair training;Functional mobility training;Therapeutic activities;Therapeutic exercise;Balance training;Neuromuscular re-education;Patient/family education;Manual techniques;Scar mobilization;Passive range of motion;Vasopneumatic Device    PT Next Visit Plan knee fleixon ROM, Gait , quadriceps strength, stairs    PT Home Exercise Plan Access Code: [email or web address]    Consulted and Agree with Plan of Care Patient           Patient will benefit from skilled therapeutic intervention in order to improve the following deficits and impairments:  Postural dysfunction,Decreased mobility,Decreased strength,Increased edema,Impaired flexibility,Decreased balance,Decreased activity tolerance,Pain,Obesity,Difficulty walking  Visit Diagnosis: Difficulty in walking, not elsewhere classified  Muscle weakness (generalized)  Stiffness of left knee, not elsewhere classified  Localized edema  Chronic pain of left knee     Problem List Patient Active Problem List   Diagnosis  Date Noted  . Acute on chronic diastolic CHF (congestive heart failure) ([place]) 08/24/2020  . Acute cholecystitis 08/23/2020  . Elevated troponin 08/23/2020  . Arthritis of left knee 05/13/2020  . S/P total knee arthroplasty, left 05/13/2020  . History of total knee arthroplasty, right 03/31/2020  . Precordial pain   . Chest pain 08/13/2017  . Mixed dyslipidemia 08/13/2017  . CAD (coronary artery disease), native coronary artery 08/13/2017  . Mild episode of recurrent major depressive disorder ([place]) 11/03/2013  . Urge incontinence 04/02/2013  . Spinal stenosis, lumbar region, without neurogenic claudication 10/27/2012  . Spinal stenosis of lumbar region 10/01/2012  . Allergic rhinitis 09/26/2011  . Dizziness 01/23/2011  . Dyspnea 01/23/2011  . Type II diabetes mellitus ([place]) 06/27/2010  . Morbid obesity ([place]) 06/27/2010  . Impaired glucose tolerance 06/27/2010  . Disorder of bone and cartilage 06/22/2010  . Primary cardiomyopathy ([place]) 03/23/2010  . HYPERLIPIDEMIA-MIXED 03/07/2010  . Essential hypertension 03/07/2010  . CARDIOMEGALY 11/30/2009  . OTHER DYSPNEA AND RESPIRATORY ABNORMALITIES 11/30/2009  . AORTIC REGURGITATION 09/28/2009  . Chronic diastolic heart failure ([place]) 09/28/2009  . Aortic regurgitation 09/28/2009  . 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 Osteoarthrosis involving multiple sites 10/26/2008    Silvestre Mesi 10/06/2020, 4:00 PM  [place], [place], [address] Phone: [phone]   Fax:  [phone]  Name: Julie Graves MRN: [id number] Date of Birth: May 20, 1941

## 2020-10-11 ENCOUNTER — Ambulatory Visit (INDEPENDENT_AMBULATORY_CARE_PROVIDER_SITE_OTHER): Payer: Medicare Other | Admitting: Physical Therapy

## 2020-10-11 ENCOUNTER — Other Ambulatory Visit: Payer: Self-pay

## 2020-10-11 DIAGNOSIS — M25562 Pain in left knee: Secondary | ICD-10-CM

## 2020-10-11 DIAGNOSIS — G8929 Other chronic pain: Secondary | ICD-10-CM

## 2020-10-11 DIAGNOSIS — R262 Difficulty in walking, not elsewhere classified: Secondary | ICD-10-CM

## 2020-10-11 DIAGNOSIS — M6281 Muscle weakness (generalized): Secondary | ICD-10-CM

## 2020-10-11 DIAGNOSIS — M25662 Stiffness of left knee, not elsewhere classified: Secondary | ICD-10-CM | POA: Diagnosis not present

## 2020-10-11 DIAGNOSIS — R600 Localized edema: Secondary | ICD-10-CM

## 2020-10-11 NOTE — Therapy (Signed)
[place], [place], [address] Phone: [phone]   Fax:  [phone]  Physical Therapy Treatment  Patient Details  Name: Julie Graves MRN: [id number] Date of Birth: [birth year] Referring Provider (PT): Rodell Perna, MD   Encounter Date: 10/11/2020   PT End of Session - 10/11/20 1600    Visit Number 19    Number of Visits 21    Date for PT Re-Evaluation 10/04/20    Authorization Type UHC MCR    Progress Note Due on Visit 23    PT Start Time 1520    PT Stop Time 1600    PT Time Calculation (min) 40 min    Activity Tolerance Patient tolerated treatment well;No increased pain;Patient limited by fatigue    Behavior During Therapy [facility] for tasks assessed/performed           Past Medical History:  Diagnosis Date  . Anginal pain ([place])    "small pain?nerves in chest"started 10/05/13   . Anginal pain ([place])    cleared by cardiology 3/15  . Anxiety    rx given recent not taken yet  . Aortic insufficiency    mild on 9/11 cath  . CAD (coronary artery disease)    nonobstructive let heart cath 3/11: 40% ostial D1, 30% mid CFX  . CHF (congestive heart failure) ([place])   . Colon polyps 02/01/2009    MULTIPLE FRAGMENTS OF TUBULAR ADENOMAS  . Diabetes mellitus   . Frozen shoulder   . GERD (gastroesophageal reflux disease)    occ  . History of blood transfusion    pregnancy  . HLD (hyperlipidemia)   . HTN (hypertension)    ACEI cough  . Hx of cardiovascular stress test    Lexiscan Myoview (10/2013):  Fixed ant defect most c/w breast attenuation, cannot exclude apical infarct; no ischemia, EF 46%; Low Risk  . Nonischemic cardiomyopathy ([place])    Ech 3/11 difficult study, moderate aortic insuficiency noted. Left evntriculogram 3/11  . Obese   . Osteoarthritis   . Palpitation    PACs noted on telemetry while in the hospital  . Pneumonia    hx  . Stress incontinence     Past Surgical History:  Procedure Laterality  Date  . ABDOMINAL HYSTERECTOMY    . APPENDECTOMY    . BLADDER NECK SUSPENSION  85  . CARDIAC CATHETERIZATION    . CHOLECYSTECTOMY N/A 08/24/2020   Procedure: LAPAROSCOPIC CHOLECYSTECTOMY;  Surgeon: Erroll Luna, MD;  Location[facility];  Service: General;  Laterality: N/A;  . EYE SURGERY    . HAND SURGERY     right "had knots cut out"  . KNEE ARTHROPLASTY Right 12/09/2013   Procedure: COMPUTER ASSISTED Right TOTAL KNEE ARTHROPLASTY;  Surgeon: Marybelle Killings, MD;  Location: [place];  Service: Orthopedics;  Laterality: Right;  . LEFT HEART CATH AND CORONARY ANGIOGRAPHY N/A 08/15/2017   Procedure: LEFT HEART CATH AND CORONARY ANGIOGRAPHY;  Surgeon: Jettie Booze, MD;  Location: [place] [facility];  Service: Cardiovascular;  Laterality: N/A;  . TOTAL KNEE ARTHROPLASTY Left 05/13/2020   Procedure: LEFT TOTAL KNEE ARTHROPLASTY;  Surgeon: Marybelle Killings, MD;  Location: [place];  Service: Orthopedics;  Laterality: Left;  . TUBAL LIGATION      There were no vitals filed for this visit.   Subjective Assessment - 10/11/20 1545    Subjective Pt arriving today reproting no pain. Pt reporting walking is going well at home and she has been doing her HEP.  Pertinent History CAD, HTN, DM aortic insuffiency, hyperlipidemia OA, obesity, anxiety, h/o pneumonia, R TKA 2015, hysterectomy, heart cath angiograph 2019.    Limitations Standing;Walking    Patient Stated Goals To be able to go up and down a flight of stairs.  Walk without "relying" on the cane.    Currently in Pain? No/denies    Pain Onset More than a month ago              [facility] PT Assessment - 10/11/20 0001      Assessment   Medical Diagnosis left TKA    Referring Provider (PT) Rodell Perna, MD      AROM   Left Knee Extension 0    Left Knee Flexion 100                         [facility] Adult PT Treatment/Exercise - 10/11/20 0001      Knee/Hip Exercises: Stretches   Knee: Self-Stretch Limitations seated tailgate stretch  10 sec X10    Gastroc Stretch Both;60 seconds    Gastroc Stretch Limitations slant board      Knee/Hip Exercises: Aerobic   Recumbent Bike seat 3 partial revolutions      Knee/Hip Exercises: Machines for Strengthening   Total Gym Leg Press 75# bilateral LE's, 3X10, L LE only 37# 2x 10      Knee/Hip Exercises: Standing   Forward Step Up Both;10 reps;Hand Hold: 1;Step Height: 6"    Forward Step Up Limitations difficulty noted when lifting left LE onto step and with pushing up, pt utilizing her single UE support    Other Standing Knee Exercises tandem balance 20 sec X 4 reps with intermit UE support PRN      Knee/Hip Exercises: Seated   Long Arc Quad Both;10 reps;3 sets    [employer] Weight 4 lbs.    Sit to Sand 15 reps   from lowest mat position with no UE support     Knee/Hip Exercises: Supine   Straight Leg Raises Strengthening;Left;3 sets;10 reps      Manual Therapy   Manual therapy comments Lt knee PROM in supine                    PT Short Term Goals - 09/16/20 1625      PT SHORT TERM GOAL #1   Title Pt will be independent in her initial HEP.    Time 3    Period Weeks    Status Achieved    Target Date 07/29/20      PT SHORT TERM GOAL #2   Title Pt will be able to perform sit to stand with no UE support    Time 3    Period Weeks    Status Achieved    Target Date 07/29/20             PT Long Term Goals - 10/11/20 1612      PT LONG TERM GOAL #1   Title independent with HEP and progression    Status On-going      PT LONG TERM GOAL #2   Title Pt will be albe to amb with no assistive device >/= 500 feet with pain </= 2/10.    Status On-going      PT LONG TERM GOAL #3   Title improve strength in RLE to at least 4/5 knee flex/ext for improved mobility.    Status On-going  PT LONG TERM GOAL #4   Title Pt will be albe to improve her L knee active  flexion to 100 degrees for improved mobility and gait.    Status On-going      PT LONG TERM  GOAL #5   Title Pt will improve her FOTO score to >/= 57% function.    Status On-going                 Plan - 10/11/20 1602    Clinical Impression Statement Pt reporting no pain upon arrival. Pt tolerating exercises well continuing to progress with strength and ROM. AROM measured to 100 degrees flexion and 0 degrees extension. Continue skilled PT.    Personal Factors and Comorbidities Comorbidity 3+    Comorbidities R TKA, CHF, anxiety, CAD, DM, HTN, OA, obesity, aortic insufficiency,    Examination-Activity Limitations Lift;Transfers;Stairs;Stand    Examination-Participation Restrictions Church;Other    Stability/Clinical Decision Making Stable/Uncomplicated    Rehab Potential Good    PT Frequency 2x / week    PT Treatment/Interventions ADLs/Self Care Home Management;Cryotherapy;Electrical Stimulation;Ultrasound;Gait training;Stair training;Functional mobility training;Therapeutic activities;Therapeutic exercise;Balance training;Neuromuscular re-education;Patient/family education;Manual techniques;Scar mobilization;Passive range of motion;Vasopneumatic Device    PT Next Visit Plan knee fleixon ROM, Gait , quadriceps strength, stairs    PT Home Exercise Plan Access Code: [email or web address]    Consulted and Agree with Plan of Care Patient           Patient will benefit from skilled therapeutic intervention in order to improve the following deficits and impairments:  Postural dysfunction,Decreased mobility,Decreased strength,Increased edema,Impaired flexibility,Decreased balance,Decreased activity tolerance,Pain,Obesity,Difficulty walking  Visit Diagnosis: Difficulty in walking, not elsewhere classified  Muscle weakness (generalized)  Stiffness of left knee, not elsewhere classified  Localized edema  Chronic pain of left knee  Acute pain of left knee     Problem List Patient Active Problem List   Diagnosis Date Noted  . Acute on chronic diastolic CHF (congestive heart failure)  ([place]) 08/24/2020  . Acute cholecystitis 08/23/2020  . Elevated troponin 08/23/2020  . Arthritis of left knee 05/13/2020  . S/P total knee arthroplasty, left 05/13/2020  . History of total knee arthroplasty, right 03/31/2020  . Precordial pain   . Chest pain 08/13/2017  . Mixed dyslipidemia 08/13/2017  . CAD (coronary artery disease), native coronary artery 08/13/2017  . Mild episode of recurrent major depressive disorder ([place]) 11/03/2013  . Urge incontinence 04/02/2013  . Spinal stenosis, lumbar region, without neurogenic claudication 10/27/2012  . Spinal stenosis of lumbar region 10/01/2012  . Allergic rhinitis 09/26/2011  . Dizziness 01/23/2011  . Dyspnea 01/23/2011  . Type II diabetes mellitus ([place]) 06/27/2010  . Morbid obesity ([place]) 06/27/2010  . Impaired glucose tolerance 06/27/2010  . Disorder of bone and cartilage 06/22/2010  . Primary cardiomyopathy ([place]) 03/23/2010  . HYPERLIPIDEMIA-MIXED 03/07/2010  . Essential hypertension 03/07/2010  . CARDIOMEGALY 11/30/2009  . OTHER DYSPNEA AND RESPIRATORY ABNORMALITIES 11/30/2009  . AORTIC REGURGITATION 09/28/2009  . Chronic diastolic heart failure ([place]) 09/28/2009  . Aortic regurgitation 09/28/2009  . 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 Osteoarthrosis involving multiple sites 10/26/2008    Oretha Caprice, PT, MPT 10/11/2020, 4:16 PM  [facility] [address] [place], [place], [address] Phone: [phone]   Fax:  [phone]  Name: AHLIVIA SALAHUDDIN MRN: [id number] Date of Birth: June 07, 1941

## 2020-10-13 ENCOUNTER — Encounter: Payer: Self-pay | Admitting: Physical Therapy

## 2020-10-13 ENCOUNTER — Ambulatory Visit (INDEPENDENT_AMBULATORY_CARE_PROVIDER_SITE_OTHER): Payer: Medicare Other | Admitting: Physical Therapy

## 2020-10-13 ENCOUNTER — Other Ambulatory Visit: Payer: Self-pay

## 2020-10-13 DIAGNOSIS — M25562 Pain in left knee: Secondary | ICD-10-CM

## 2020-10-13 DIAGNOSIS — R262 Difficulty in walking, not elsewhere classified: Secondary | ICD-10-CM

## 2020-10-13 DIAGNOSIS — M6281 Muscle weakness (generalized): Secondary | ICD-10-CM

## 2020-10-13 DIAGNOSIS — G8929 Other chronic pain: Secondary | ICD-10-CM

## 2020-10-13 DIAGNOSIS — M25662 Stiffness of left knee, not elsewhere classified: Secondary | ICD-10-CM

## 2020-10-13 DIAGNOSIS — R600 Localized edema: Secondary | ICD-10-CM | POA: Diagnosis not present

## 2020-10-13 NOTE — Therapy (Signed)
[place], [place], [address] Phone: [phone]   Fax:  [phone]  Physical Therapy Treatment  Patient Details  Name: Julie Graves MRN: [id number] Date of Birth: 08-23-1940 Referring Provider (PT): Rodell Perna, MD   Encounter Date: 10/13/2020   PT End of Session - 10/13/20 1543    Visit Number 20    Number of Visits 21    Date for PT Re-Evaluation 10/04/20    Authorization Type UHC MCR    Progress Note Due on Visit 23    PT Start Time 8127    PT Stop Time 1553    PT Time Calculation (min) 38 min    Activity Tolerance Patient tolerated treatment well;No increased pain;Patient limited by fatigue    Behavior During Therapy [facility] for tasks assessed/performed           Past Medical History:  Diagnosis Date  . Anginal pain ([place])    "small pain?nerves in chest"started 10/05/13   . Anginal pain ([place])    cleared by cardiology 3/15  . Anxiety    rx given recent not taken yet  . Aortic insufficiency    mild on 9/11 cath  . CAD (coronary artery disease)    nonobstructive let heart cath 3/11: 40% ostial D1, 30% mid CFX  . CHF (congestive heart failure) ([place])   . Colon polyps 02/01/2009    MULTIPLE FRAGMENTS OF TUBULAR ADENOMAS  . Diabetes mellitus   . Frozen shoulder   . GERD (gastroesophageal reflux disease)    occ  . History of blood transfusion    pregnancy  . HLD (hyperlipidemia)   . HTN (hypertension)    ACEI cough  . Hx of cardiovascular stress test    Lexiscan Myoview (10/2013):  Fixed ant defect most c/w breast attenuation, cannot exclude apical infarct; no ischemia, EF 46%; Low Risk  . Nonischemic cardiomyopathy ([place])    Ech 3/11 difficult study, moderate aortic insuficiency noted. Left evntriculogram 3/11  . Obese   . Osteoarthritis   . Palpitation    PACs noted on telemetry while in the hospital  . Pneumonia    hx  . Stress incontinence     Past Surgical History:  Procedure Laterality  Date  . ABDOMINAL HYSTERECTOMY    . APPENDECTOMY    . BLADDER NECK SUSPENSION  85  . CARDIAC CATHETERIZATION    . CHOLECYSTECTOMY N/A 08/24/2020   Procedure: LAPAROSCOPIC CHOLECYSTECTOMY;  Surgeon: Erroll Luna, MD;  [facility];  Service: General;  Laterality: N/A;  . EYE SURGERY    . HAND SURGERY     right "had knots cut out"  . KNEE ARTHROPLASTY Right 12/09/2013   Procedure: COMPUTER ASSISTED Right TOTAL KNEE ARTHROPLASTY;  Surgeon: Marybelle Killings, MD;  Location: [place];  Service: Orthopedics;  Laterality: Right;  . LEFT HEART CATH AND CORONARY ANGIOGRAPHY N/A 08/15/2017   Procedure: LEFT HEART CATH AND CORONARY ANGIOGRAPHY;  Surgeon: Jettie Booze, MD;  Location: [place] [facility];  Service: Cardiovascular;  Laterality: N/A;  . TOTAL KNEE ARTHROPLASTY Left 05/13/2020   Procedure: LEFT TOTAL KNEE ARTHROPLASTY;  Surgeon: Marybelle Killings, MD;  Location: [place];  Service: Orthopedics;  Laterality: Left;  . TUBAL LIGATION      There were no vitals filed for this visit.   Subjective Assessment - 10/13/20 1525    Subjective Pt relays no knee pain upon arrival, just stiffness and fatigue.    Pertinent History CAD, HTN, DM aortic insuffiency,  hyperlipidemia OA, obesity, anxiety, h/o pneumonia, R TKA 2015, hysterectomy, heart cath angiograph 2019.    Limitations Standing;Walking    How long can you stand comfortably? < 10 minutes    How long can you walk comfortably? 5 minutes    Diagnostic tests x-ray    Patient Stated Goals To be able to go up and down a flight of stairs.  Walk without "relying" on the cane.    Pain Onset More than a month ago            [facility] Adult PT Treatment/Exercise - 10/13/20 0001      Knee/Hip Exercises: Stretches   Knee: Self-Stretch Limitations seated tailgate stretch 10 sec X10    Gastroc Stretch Both;60 seconds;2 reps    Gastroc Stretch Limitations slant board      Knee/Hip Exercises: Aerobic   Recumbent Bike seat 2 partial revolutions holding 5  sec at top of flexion ROM, 8 min      Knee/Hip Exercises: Machines for Strengthening   Cybex Knee Extension 5# bilat 2X10    Cybex Knee Flexion 20# bilat 2x10    Total Gym Leg Press 81# bilateral LE's, 3X10, L LE only 43# 2x 10      Knee/Hip Exercises: Standing   Forward Step Up Both;10 reps;Hand Hold: 1;Step Height: 6"    Forward Step Up Limitations difficulty noted when lifting left LE onto step and with pushing up, pt utilizing her single UE support    Other Standing Knee Exercises tandem balance 20 sec X 4 reps with intermit UE support PRN      Knee/Hip Exercises: Seated   Sit to Sand --      Manual Therapy   Manual therapy comments Lt knee PROM                    PT Short Term Goals - 09/16/20 1625      PT SHORT TERM GOAL #1   Title Pt will be independent in her initial HEP.    Time 3    Period Weeks    Status Achieved    Target Date 07/29/20      PT SHORT TERM GOAL #2   Title Pt will be able to perform sit to stand with no UE support    Time 3    Period Weeks    Status Achieved    Target Date 07/29/20             PT Long Term Goals - 10/11/20 1612      PT LONG TERM GOAL #1   Title independent with HEP and progression    Status On-going      PT LONG TERM GOAL #2   Title Pt will be albe to amb with no assistive device >/= 500 feet with pain </= 2/10.    Status On-going      PT LONG TERM GOAL #3   Title improve strength in RLE to at least 4/5 knee flex/ext for improved mobility.    Status On-going      PT LONG TERM GOAL #4   Title Pt will be albe to improve her L knee active  flexion to 100 degrees for improved mobility and gait.    Status On-going      PT LONG TERM GOAL #5   Title Pt will improve her FOTO score to >/= 57% function.    Status On-going  Plan - 10/13/20 1550    Clinical Impression Statement She was able to progress strengthening program some, ROM is slowly progressing. She has 2 more visits left and  will plan to discharge to independent program at home and she plans to join gym when PT is finished.    Personal Factors and Comorbidities Comorbidity 3+    Comorbidities R TKA, CHF, anxiety, CAD, DM, HTN, OA, obesity, aortic insufficiency,    Examination-Activity Limitations Lift;Transfers;Stairs;Stand    Examination-Participation Restrictions Church;Other    Stability/Clinical Decision Making Stable/Uncomplicated    Rehab Potential Good    PT Frequency 2x / week    PT Treatment/Interventions ADLs/Self Care Home Management;Cryotherapy;Electrical Stimulation;Ultrasound;Gait training;Stair training;Functional mobility training;Therapeutic activities;Therapeutic exercise;Balance training;Neuromuscular re-education;Patient/family education;Manual techniques;Scar mobilization;Passive range of motion;Vasopneumatic Device    PT Next Visit Plan knee fleixon ROM, Gait , quadriceps strength, stairs    PT Home Exercise Plan Access Code: [email or web address]    Consulted and Agree with Plan of Care Patient           Patient will benefit from skilled therapeutic intervention in order to improve the following deficits and impairments:  Postural dysfunction,Decreased mobility,Decreased strength,Increased edema,Impaired flexibility,Decreased balance,Decreased activity tolerance,Pain,Obesity,Difficulty walking  Visit Diagnosis: Difficulty in walking, not elsewhere classified  Stiffness of left knee, not elsewhere classified  Muscle weakness (generalized)  Localized edema  Chronic pain of left knee     Problem List Patient Active Problem List   Diagnosis Date Noted  . Acute on chronic diastolic CHF (congestive heart failure) ([place]) 08/24/2020  . Acute cholecystitis 08/23/2020  . Elevated troponin 08/23/2020  . Arthritis of left knee 05/13/2020  . S/P total knee arthroplasty, left 05/13/2020  . History of total knee arthroplasty, right 03/31/2020  . Precordial pain   . Chest pain 08/13/2017  . Mixed  dyslipidemia 08/13/2017  . CAD (coronary artery disease), native coronary artery 08/13/2017  . Mild episode of recurrent major depressive disorder ([place]) 11/03/2013  . Urge incontinence 04/02/2013  . Spinal stenosis, lumbar region, without neurogenic claudication 10/27/2012  . Spinal stenosis of lumbar region 10/01/2012  . Allergic rhinitis 09/26/2011  . Dizziness 01/23/2011  . Dyspnea 01/23/2011  . Type II diabetes mellitus ([place]) 06/27/2010  . Morbid obesity ([place]) 06/27/2010  . Impaired glucose tolerance 06/27/2010  . Disorder of bone and cartilage 06/22/2010  . Primary cardiomyopathy ([place]) 03/23/2010  . HYPERLIPIDEMIA-MIXED 03/07/2010  . Essential hypertension 03/07/2010  . CARDIOMEGALY 11/30/2009  . OTHER DYSPNEA AND RESPIRATORY ABNORMALITIES 11/30/2009  . AORTIC REGURGITATION 09/28/2009  . Chronic diastolic heart failure ([place]) 09/28/2009  . Aortic regurgitation 09/28/2009  . 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 Osteoarthrosis involving multiple sites 10/26/2008    Silvestre Mesi 10/13/2020, 3:52 PM  [facility] Physical Therapy [address] [place], [place], [address] Phone: [phone]   Fax:  [phone]  Name: Julie Graves MRN: [id number] Date of Birth: 1940/11/18

## 2020-10-18 ENCOUNTER — Ambulatory Visit: Payer: Medicare Other | Admitting: Physical Therapy

## 2020-10-18 ENCOUNTER — Other Ambulatory Visit: Payer: Self-pay

## 2020-10-18 ENCOUNTER — Encounter: Payer: Self-pay | Admitting: Physical Therapy

## 2020-10-18 DIAGNOSIS — R600 Localized edema: Secondary | ICD-10-CM

## 2020-10-18 DIAGNOSIS — M25662 Stiffness of left knee, not elsewhere classified: Secondary | ICD-10-CM | POA: Diagnosis not present

## 2020-10-18 DIAGNOSIS — G8929 Other chronic pain: Secondary | ICD-10-CM

## 2020-10-18 DIAGNOSIS — R262 Difficulty in walking, not elsewhere classified: Secondary | ICD-10-CM | POA: Diagnosis not present

## 2020-10-18 DIAGNOSIS — M6281 Muscle weakness (generalized): Secondary | ICD-10-CM | POA: Diagnosis not present

## 2020-10-18 DIAGNOSIS — M25562 Pain in left knee: Secondary | ICD-10-CM

## 2020-10-18 NOTE — Therapy (Addendum)
[place], [place], [address] Phone: [phone]   Fax:  [phone]  Physical Therapy Treatment Discharge Summary  Patient Details  Name: Julie Graves MRN: [id number] Date of Birth: Oct 27, 1940 Referring Provider (PT): Rodell Perna MD   Encounter Date: 10/18/2020   PT End of Session - 10/18/20 1520    Visit Number 21    Number of Visits 21    Date for PT Re-Evaluation 10/04/20    Authorization Type UHC MCR    PT Start Time 2993    PT Stop Time 7169    PT Time Calculation (min) 40 min    Activity Tolerance Patient tolerated treatment well;No increased pain;Patient limited by fatigue    Behavior During Therapy [facility] for tasks assessed/performed           Past Medical History:  Diagnosis Date  . Anginal pain ([place])    "small pain?nerves in chest"started 10/05/13   . Anginal pain ([place])    cleared by cardiology 3/15  . Anxiety    rx given recent not taken yet  . Aortic insufficiency    mild on 9/11 cath  . CAD (coronary artery disease)    nonobstructive let heart cath 3/11: 40% ostial D1, 30% mid CFX  . CHF (congestive heart failure) ([place])   . Colon polyps 02/01/2009    MULTIPLE FRAGMENTS OF TUBULAR ADENOMAS  . Diabetes mellitus   . Frozen shoulder   . GERD (gastroesophageal reflux disease)    occ  . History of blood transfusion    pregnancy  . HLD (hyperlipidemia)   . HTN (hypertension)    ACEI cough  . Hx of cardiovascular stress test    Lexiscan Myoview (10/2013):  Fixed ant defect most c/w breast attenuation, cannot exclude apical infarct; no ischemia, EF 46%; Low Risk  . Nonischemic cardiomyopathy ([place])    Ech 3/11 difficult study, moderate aortic insuficiency noted. Left evntriculogram 3/11  . Obese   . Osteoarthritis   . Palpitation    PACs noted on telemetry while in the hospital  . Pneumonia    hx  . Stress incontinence     Past Surgical History:  Procedure Laterality Date  . ABDOMINAL  HYSTERECTOMY    . APPENDECTOMY    . BLADDER NECK SUSPENSION  85  . CARDIAC CATHETERIZATION    . CHOLECYSTECTOMY N/A 08/24/2020   Procedure: LAPAROSCOPIC CHOLECYSTECTOMY;  Surgeon: Erroll Luna, MD;  [facility];  Service: General;  Laterality: N/A;  . EYE SURGERY    . HAND SURGERY     right "had knots cut out"  . KNEE ARTHROPLASTY Right 12/09/2013   Procedure: COMPUTER ASSISTED Right TOTAL KNEE ARTHROPLASTY;  Surgeon: Marybelle Killings, MD;  Location: [place];  Service: Orthopedics;  Laterality: Right;  . LEFT HEART CATH AND CORONARY ANGIOGRAPHY N/A 08/15/2017   Procedure: LEFT HEART CATH AND CORONARY ANGIOGRAPHY;  Surgeon: Jettie Booze, MD;  Location: [place] [facility];  Service: Cardiovascular;  Laterality: N/A;  . TOTAL KNEE ARTHROPLASTY Left 05/13/2020   Procedure: LEFT TOTAL KNEE ARTHROPLASTY;  Surgeon: Marybelle Killings, MD;  Location: [place];  Service: Orthopedics;  Laterality: Left;  . TUBAL LIGATION      There were no vitals filed for this visit.   Subjective Assessment - 10/18/20 1518    Subjective Pt arriving to therapy today reporting no pain.    Pertinent History CAD, HTN, DM aortic insuffiency, hyperlipidemia OA, obesity, anxiety, h/o pneumonia, R TKA 2015, hysterectomy,  heart cath angiograph 2019.    Limitations Standing;Walking    How long can you stand comfortably? < 10 minutes    How long can you walk comfortably? 5 minutes    Patient Stated Goals To be able to go up and down a flight of stairs.  Walk without "relying" on the cane.    Currently in Pain? No/denies              [facility] PT Assessment - 10/18/20 0001      Assessment   Medical Diagnosis left TKA    Referring Provider (PT) Rodell Perna MD      AROM   Left Knee Extension 0    Left Knee Flexion 105      Strength   Left Knee Flexion 4/5    Left Knee Extension 4/5      Ambulation/Gait   Gait Comments Pt amb 502 feet with no device with mild trendelendburg gait pattern on level surfaces with  increased time.                         [place] Adult PT Treatment/Exercise - 10/18/20 0001      Knee/Hip Exercises: Seated   Sit to Sand 10 reps                  PT Education - 10/18/20 1601    Education Details FOTO performed, pt was edu in updated HEP and each exercise was demonstrated by pt    Person(s) Educated Patient    Methods Explanation;Demonstration;Handout    Comprehension Verbalized understanding;Returned demonstration            PT Short Term Goals - 10/18/20 1521      PT SHORT TERM GOAL #1   Title Pt will be independent in her initial HEP.    Status Achieved      PT SHORT TERM GOAL #2   Title Pt will be able to perform sit to stand with no UE support    Status Achieved             PT Long Term Goals - 10/18/20 1521      PT LONG TERM GOAL #1   Title independent with HEP and progression    Status Achieved      PT LONG TERM GOAL #2   Title Pt will be albe to amb with no assistive device >/= 500 feet with pain </= 2/10.    Baseline Pt is able to amb 502 feet with no device on level surfaces with mild trendelenburg gait pattern.    Status Achieved      PT LONG TERM GOAL #3   Baseline Pt grossly 4/5 in right knee flexion and extension    Status Achieved      PT LONG TERM GOAL #4   Baseline AROM: left knee flexion 105 degrees    Status Achieved      PT LONG TERM GOAL #5   Title Pt will improve her FOTO score to >/= 57% function.    Baseline FOTO 58.02 on 10/18/2020    Status Achieved                 Plan - 10/18/20 1536    Clinical Impression Statement Pt arriving to her 21st PT visit with no pain reported in bilateral knees. Pt has improved in both ROM and strength. Pt has met all of her LTG's set at initial evaluation. Pt to be  discharged today with updated HEP.    Personal Factors and Comorbidities Comorbidity 3+    Comorbidities R TKA, CHF, anxiety, CAD, DM, HTN, OA, obesity, aortic insufficiency,     Examination-Activity Limitations Lift;Transfers;Stairs;Stand    Examination-Participation Restrictions Church;Other    Stability/Clinical Decision Making Stable/Uncomplicated    Rehab Potential Good    PT Frequency 2x / week    PT Duration 4 weeks    PT Treatment/Interventions ADLs/Self Care Home Management;Cryotherapy;Electrical Stimulation;Ultrasound;Gait training;Stair training;Functional mobility training;Therapeutic activities;Therapeutic exercise;Balance training;Neuromuscular re-education;Patient/family education;Manual techniques;Scar mobilization;Passive range of motion;Vasopneumatic Device    PT Next Visit Plan Discharge pt from skilled PT at present time    PT Home Exercise Plan Access Code: [email or web address]    Consulted and Agree with Plan of Care Patient           Patient will benefit from skilled therapeutic intervention in order to improve the following deficits and impairments:  Postural dysfunction,Decreased mobility,Decreased strength,Increased edema,Impaired flexibility,Decreased balance,Decreased activity tolerance,Pain,Obesity,Difficulty walking  Visit Diagnosis: Difficulty in walking, not elsewhere classified  Stiffness of left knee, not elsewhere classified  Muscle weakness (generalized)  Localized edema  Chronic pain of left knee  Acute pain of left knee    PHYSICAL THERAPY DISCHARGE SUMMARY  Visits from Start of Care: 21  Current functional level related to goals / functional outcomes: See above   Remaining deficits: See above   Education / Equipment: HEP Plan: Patient agrees to discharge.  Patient goals were met. Patient is being discharged due to meeting the stated rehab goals.  ?????       Problem List Patient Active Problem List   Diagnosis Date Noted  . Acute on chronic diastolic CHF (congestive heart failure) ([place]) 08/24/2020  . Acute cholecystitis 08/23/2020  . Elevated troponin 08/23/2020  . Arthritis of left knee 05/13/2020  . S/P  total knee arthroplasty, left 05/13/2020  . History of total knee arthroplasty, right 03/31/2020  . Precordial pain   . Chest pain 08/13/2017  . Mixed dyslipidemia 08/13/2017  . CAD (coronary artery disease), native coronary artery 08/13/2017  . Mild episode of recurrent major depressive disorder ([place]) 11/03/2013  . Urge incontinence 04/02/2013  . Spinal stenosis, lumbar region, without neurogenic claudication 10/27/2012  . Spinal stenosis of lumbar region 10/01/2012  . Allergic rhinitis 09/26/2011  . Dizziness 01/23/2011  . Dyspnea 01/23/2011  . Type II diabetes mellitus ([place]) 06/27/2010  . Morbid obesity ([place]) 06/27/2010  . Impaired glucose tolerance 06/27/2010  . Disorder of bone and cartilage 06/22/2010  . Primary cardiomyopathy ([place]) 03/23/2010  . HYPERLIPIDEMIA-MIXED 03/07/2010  . Essential hypertension 03/07/2010  . CARDIOMEGALY 11/30/2009  . OTHER DYSPNEA AND RESPIRATORY ABNORMALITIES 11/30/2009  . AORTIC REGURGITATION 09/28/2009  . Chronic diastolic heart failure ([place]) 09/28/2009  . Aortic regurgitation 09/28/2009  . 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 Osteoarthrosis involving multiple sites 10/26/2008    Oretha Caprice, PT, MPT 10/18/2020, 4:04 PM  [facility] Physical Therapy [address] [place], [place], [address] Phone: [phone]   Fax:  [phone]  Name: Julie Graves MRN: [id number] Date of Birth: Jul 18, 1940

## 2020-10-20 ENCOUNTER — Encounter: Payer: Medicare Other | Admitting: Physical Therapy

## 2020-10-25 ENCOUNTER — Other Ambulatory Visit: Payer: Self-pay | Admitting: Cardiology

## 2020-10-25 DIAGNOSIS — I5042 Chronic combined systolic (congestive) and diastolic (congestive) heart failure: Secondary | ICD-10-CM

## 2020-10-25 DIAGNOSIS — I10 Essential (primary) hypertension: Secondary | ICD-10-CM

## 2020-12-28 ENCOUNTER — Other Ambulatory Visit: Payer: Self-pay

## 2020-12-28 ENCOUNTER — Encounter: Payer: Self-pay | Admitting: Cardiology

## 2020-12-28 ENCOUNTER — Ambulatory Visit: Payer: Medicare Other | Admitting: Cardiology

## 2020-12-28 VITALS — BP 116/57 | HR 63 | Temp 97.7°F | Resp 18 | Ht 60.0 in | Wt 208.0 lb

## 2020-12-28 DIAGNOSIS — I10 Essential (primary) hypertension: Secondary | ICD-10-CM

## 2020-12-28 DIAGNOSIS — I5032 Chronic diastolic (congestive) heart failure: Secondary | ICD-10-CM

## 2020-12-28 NOTE — Progress Notes (Signed)
Primary Physician/Referring:  Harrison Mons, PA  Patient ID: Julie Graves, female    DOB: [birth year], 80 y.o.   MRN: [id number]  Chief Complaint  Patient presents with   Chronic Diastolic Heart Failure   Hypertension   Follow-up    6 months   HPI:    Julie Graves  is a 80 y.o. female patient with  morbid obesity, , nonischemic cardiomyopathy with EF 40% by echo in June 2019 but improved to low normal by medical therapy, moderate coronary artery disease by coronary angiogram on 08/21/17 and revealed mid circumflex 50% and small with 2 distal diagonal 80% stenosis. She has mild Chronic stable angina pectoris, mild to moderate aortic insufficiency, pre diabetes, hypertension, hyperlipidemia, former tobacco use with 15-pack-year history.  She underwent left total knee arthroplasty on 05/13/2020 without any periprocedural cardiac complications.  She was admitted to [facility] on 08/22/2020 with acute cholecystitis and underwent laparoscopic cholecystectomy, also had acute decompensated heart failure with mild fluid overload state.  She has recuperated well from this.  She presents here for 11-monthoffice visit for chronic diastolic heart failure and hypertension.    Presently doing well, has chronic left leg edema and mild right leg edema, uses support stockings.  Dyspnea has remained stable.  Fortunately she has not had any recurrence of chest pain for the past 6 months.  Past Medical History:  Diagnosis Date   Anginal pain ([place]    "small pain?nerves in chest"started 10/05/13    Anginal pain ([place]    cleared by cardiology 3/15   Anxiety    rx given recent not taken yet   Aortic insufficiency    mild on 9/11 cath   CAD (coronary artery disease)    nonobstructive let heart cath 3/11: 40% ostial D1, 30% mid CFX   CHF (congestive heart failure) ([place]    Colon polyps 02/01/2009    MULTIPLE FRAGMENTS OF TUBULAR ADENOMAS   Diabetes mellitus    Frozen shoulder    GERD  (gastroesophageal reflux disease)    occ   History of blood transfusion    pregnancy   HLD (hyperlipidemia)    HTN (hypertension)    ACEI cough   Hx of cardiovascular stress test    Lexiscan Myoview (10/2013):  Fixed ant defect most c/w breast attenuation, cannot exclude apical infarct; no ischemia, EF 46%; Low Risk   Nonischemic cardiomyopathy ([place]    Ech 3/11 difficult study, moderate aortic insuficiency noted. Left evntriculogram 3/11   Obese    Osteoarthritis    Palpitation    PACs noted on telemetry while in the hospital   Pneumonia    hx   Stress incontinence    Past Surgical History:  Procedure Laterality Date   ABDOMINAL HYSTERECTOMY     APPENDECTOMY     BLADDER NECK SUSPENSION  85   CARDIAC CATHETERIZATION     CHOLECYSTECTOMY N/A 08/24/2020   Procedure: LAPAROSCOPIC CHOLECYSTECTOMY;  Surgeon: CErroll Luna MD;  [facility];  Service: General;  Laterality: N/A;   EYE SURGERY     HAND SURGERY     right "had knots cut out"   KNEE ARTHROPLASTY Right 12/09/2013   Procedure: COMPUTER ASSISTED Right TOTAL KNEE ARTHROPLASTY;  Surgeon: MMarybelle Killings MD;  [place]  Service: Orthopedics;  Laterality: Right;   LEFT HEART CATH AND CORONARY ANGIOGRAPHY N/A 08/15/2017   Procedure: LEFT HEART CATH AND CORONARY ANGIOGRAPHY;  Surgeon: VJettie Booze MD;  [facility];  Service: Cardiovascular;  Laterality: N/A;   TOTAL KNEE ARTHROPLASTY Left 05/13/2020   Procedure: LEFT TOTAL KNEE ARTHROPLASTY;  Surgeon: Marybelle Killings, MD;  Location: [place];  Service: Orthopedics;  Laterality: Left;   TUBAL LIGATION     Family History  Problem Relation Age of Onset   Heart attack Father    Heart attack Sister    Stroke Mother 24   Diabetes Sister    Heart disease Sister    Diabetes Brother    Colon cancer Brother    Breast cancer Neg Hx     Social History   Tobacco Use   Smoking status: Former    Packs/day: 1.00    Years: 15.00    Pack years: 15.00     Types: Cigarettes    Quit date: 10/10/1983    Years since quitting: 37.2   Smokeless tobacco: Never   Tobacco comments:    Quit 1970s  Substance Use Topics   Alcohol use: No    Alcohol/week: 0.0 standard drinks   ROS  Review of Systems  Cardiovascular:  Positive for dyspnea on exertion and leg swelling. Negative for chest pain.  Musculoskeletal:  Positive for joint pain.  Gastrointestinal:  Negative for melena.  Objective  Blood pressure (!) 116/57, pulse 63, temperature 97.7 F (36.5 C), temperature source Temporal, resp. rate 18, height 5' (1.524 m), weight 208 lb (94.3 kg), SpO2 97 %.  Vitals with BMI 12/28/2020 09/27/2020 08/26/2020  Height _0  _1  -  Weight 208 lbs 227 lbs -  BMI 46.27 03.50 -  Systolic 093 818 299  Diastolic 57 73 66  Pulse 63 78 62     Physical Exam Constitutional:      Comments: Morbidly obese in no acute distress.  Neck:     Vascular: No carotid bruit or JVD.  Cardiovascular:     Rate and Rhythm: Normal rate and regular rhythm.     Pulses:          Carotid pulses are 2+ on the right side and 2+ on the left side.      Dorsalis pedis pulses are 2+ on the right side and 2+ on the left side.       Posterior tibial pulses are 2+ on the right side and 2+ on the left side.     Heart sounds: Normal heart sounds. No murmur heard.   No gallop.  Pulmonary:     Effort: Pulmonary effort is normal.     Breath sounds: Normal breath sounds.  Abdominal:     General: Bowel sounds are normal.     Palpations: Abdomen is soft.     Comments: Obese. Pannus present  Musculoskeletal:        General: Swelling (Left below-knee 2+ pitting edema and right 1-2+ pitting edema.  Findings similar with no signs of inflammation.) present. Normal range of motion.     Cervical back: Neck supple.     Comments: Left knee post arthroplasty edema and tenderness. No signs of inflammation.    Laboratory examination:   Recent Labs    08/23/20 1548 08/24/20 0348 08/25/20 0340  NA  139 138 135  K 3.7 3.5 3.5  CL 106 109 107  CO2 19* 18* 21*  GLUCOSE 89 95 82  BUN _2 CREATININE 0.56 0.62 0.67  CALCIUM 8.9 8.4* 8.0*  GFRNONAA >60 >60 >60   CrCl cannot be calculated (Patient's most recent lab result is older than the  maximum 21 days allowed.).  CMP Latest Ref Rng & Units 08/25/2020 08/24/2020 08/23/2020  Glucose 70 - 99 mg/dL 82 95 89  BUN 8 - 23 mg/dL _0 Creatinine 0.44 - 1.00 mg/dL 0.67 0.62 0.56  Sodium 135 - 145 mmol/L 135 138 139  Potassium 3.5 - 5.1 mmol/L 3.5 3.5 3.7  Chloride 98 - 111 mmol/L 107 109 106  CO2 22 - 32 mmol/L 21(L) 18(L) 19(L)  Calcium 8.9 - 10.3 mg/dL 8.0(L) 8.4(L) 8.9  Total Protein 6.5 - 8.1 g/dL 5.7(L) 6.1(L) -  Total Bilirubin 0.3 - 1.2 mg/dL 1.0 1.3(H) -  Alkaline Phos 38 - 126 U/L 40 35(L) -  AST 15 - 41 U/L 28 14(L) -  ALT 0 - 44 U/L 16 11 -   CBC Latest Ref Rng & Units 08/26/2020 08/25/2020 08/24/2020  WBC 4.0 - 10.5 K/uL 12.1(H) 15.1(H) 19.7(H)  Hemoglobin 12.0 - 15.0 g/dL 10.7(L) 10.6(L) 12.3  Hematocrit 36.0 - 46.0 % 33.4(L) 33.3(L) 39.1  Platelets 150 - 400 K/uL 225 183 208    Component Ref Range & Units 4 mo ago  (08/23/20) 4 mo ago  (08/23/20) 4 mo ago  (08/23/20) 4 mo ago  (08/23/20) 4 mo ago  (08/22/20) 4 mo ago  (08/22/20) 1 yr ago  (06/04/19)  Troponin I (High Sensitivity) <18 ng/L 181 High Panic   192 High Panic  CM  180 High Panic  CM  161 High Panic  CM  79 High  CM         External labs:   11/25/2019:  A1c 5.7%.  Hb 12.6/HCT 37.5, platelets 203.  Total cholesterol 135, triglycerides 131, HDL 60, LDL 52.  Serum glucose 90 mg, BUN 12, creatinine 0.59, EGFR greater than 60 mL, potassium 4.8, sodium 140, CMP otherwise normal.  Medications and allergies   Allergies  Allergen Reactions   Iodine Hives and Swelling   Shellfish Allergy Swelling and Other (See Comments)    Swelling - throat and lips   Topiramate     Other reaction(s): Chest Pain   Metamucil [Psyllium]    Lisinopril Other (See Comments)     cough   Penicillins Hives, Rash and Other (See Comments)    Has patient had a PCN reaction causing immediate rash, facial/tongue/throat swelling, SOB or lightheadedness with hypotension: Unknown Has patient had a PCN reaction causing severe rash involving mucus membranes or skin necrosis: No Has patient had a PCN reaction that required hospitalization: No Has patient had a PCN reaction occurring within the last 10 years: No If all of the above answers are "NO", then may proceed with Cephalosporin use.    Tramadol Other (See Comments)    confusion     Current Outpatient Medications on File Prior to Visit  Medication Sig Dispense Refill   acetaminophen (TYLENOL) 325 MG tablet Take 2 tablets (650 mg total) by mouth every 6 (six) hours as needed.     alendronate (FOSAMAX) 70 MG tablet Take 70 mg by mouth every Monday. Take with a full glass of water on an empty stomach. Take on mondays     Ascorbic Acid (VITAMIN C PO) Take 1 tablet by mouth daily.     aspirin EC 81 MG tablet Take 81 mg by mouth daily. Swallow whole.     Calcium Carb-Cholecalciferol (CALCIUM 600/VITAMIN D3 PO) Take 1 tablet by mouth daily.      carvedilol (COREG) 6.25 MG tablet Take 6.25 mg by mouth 2 (two) times daily.  Cholecalciferol (VITAMIN D) 2000 units CAPS Take 2,000 Units by mouth daily.     diazepam (VALIUM) 2 MG tablet Take 1 mg by mouth daily as needed for anxiety.     EPINEPHrine 0.3 mg/0.3 mL IJ SOAJ injection Inject 0.3 mLs (0.3 mg total) into the muscle as needed for anaphylaxis. 1 Device 0   losartan (COZAAR) 100 MG tablet TAKE 1 TABLET(100 MG) BY MOUTH DAILY 90 tablet 3   metFORMIN (GLUCOPHAGE-XR) 500 MG 24 hr tablet Take 500 mg by mouth daily.     nitroGLYCERIN (NITROSTAT) 0.4 MG SL tablet Place 0.4 mg under the tongue every 5 (five) minutes as needed for chest pain.     oxyCODONE (OXY IR/ROXICODONE) 5 MG immediate release tablet Take 1 tablet (5 mg total) by mouth every 6 (six) hours as needed for  moderate pain, severe pain or breakthrough pain (pain not releieved by tylenol). 30 tablet 0   potassium chloride SA (KLOR-CON) 20 MEQ tablet Take 20 mEq by mouth daily.     simvastatin (ZOCOR) 40 MG tablet Take 40 mg by mouth at bedtime.     spironolactone (ALDACTONE) 25 MG tablet TAKE 1 TABLET(25 MG) BY MOUTH DAILY (Patient taking differently: Take 25 mg by mouth daily.) 90 tablet 2   No current facility-administered medications on file prior to visit.    Radiology:   No results found.  Cardiac Studies:   Stress nuclear study 11/05/2013:  Exercise Capacity:  Lexiscan with no exercise. BP Response:  Normal blood pressure response. Clinical Symptoms:  There is dyspnea. ECG Impression:  No significant ECG changes with Lexiscan. Comparison with Prior Nuclear Study: No images to compare to, however the clinical repeat appears to be similar. Overall Impression:  Low risk stress nuclear study With mild apical breast attenuation. Cannot exclude small apical infarct. LV Wall Motion:  Unable to adequately it says the overall cardiac function as the gated images were not adequately established to capture wall motion.  Coronary angiogram 07/2017:  Mid circumflex 50%, D1 ostial 50% and mid 80%, very small. Mild noncritical CAD other vessels. EF 45%.  Echocardiogram 08/23/2020:     1. Left ventricular ejection fraction, by estimation, is 50 to 55%. The left ventricle has low normal function. The left ventricle has no regional wall motion abnormalities. There is mild left ventricular hypertrophy. Left ventricular diastolic parameters were normal.  2. Right ventricular systolic function is normal. The right ventricular size is normal.  3. Left atrial size was mildly dilated.  4. The mitral valve is normal in structure. Trivial mitral valve regurgitation. No evidence of mitral stenosis.  5. The aortic valve was not well visualized. Aortic valve regurgitation is mild. Mild aortic valve stenosis.  6.  The inferior vena cava is normal in size with greater than 50% respiratory variability, suggesting right atrial pressure of 3 mmHg.  EKG  EKG 12/28/2020: Normal sinus rhythm at rate of 64 bpm, left axis deviation, left anterior fascicular block.  Poor R progression, cannot exclude anteroseptal infarct old. IVCD, LVH.  Nonspecific T abnormality.  No significant change from 06/29/2020.  Assessment     ICD-10-CM   1. Chronic diastolic heart failure (HCC)  I50.32 EKG 12-Lead    2. Essential hypertension  I10     3. Class 3 severe obesity due to excess calories with serious comorbidity and body mass index (BMI) of 40.0 to 44.9 in adult (HCC)  E66.01    Z68.41       No orders of the  defined types were placed in this encounter.   Medications Discontinued During This Encounter  Medication Reason   JARDIANCE 10 MG TABS tablet Side effect (s)   JARDIANCE 10 MG TABS tablet Side effect (s)   Discontinued by PCP due to recurrent UTI.  Recommendations:   Julie Graves EHLER  is a 80 y.o.female patient with  morbid obesity, , nonischemic cardiomyopathy with EF 40% by echo in June 2019 but improved to low normal by medical therapy, moderate coronary artery disease by coronary angiogram on 08/21/17 and revealed mid circumflex 50% and small with 2 distal diagonal 80% stenosis. She has mild Chronic stable angina pectoris, mild to moderate aortic insufficiency, pre diabetes, hypertension, hyperlipidemia, former tobacco use with 15-pack-year history.  She underwent left total knee arthroplasty on 05/13/2020 without any periprocedural cardiac complications.  She was admitted to [facility] long hospital on 08/22/2020 with acute cholecystitis and underwent laparoscopic cholecystectomy, also had acute decompensated heart failure with mild fluid overload state.  She also had mildly elevated serum troponins during hospital admission probably related to secondary troponin leak from heart failure.  She has not had any  recurrence of angina, she has not had any recurrence of acute decompensated heart failure.  Hence for now in view of prior angiography 5 years ago revealing only moderate disease, do not think she needs any stress testing.  We will continue present medical management.  Lipids are well controlled and renal function has remained stable.  Weight loss again discussed with the patient. I will see her back in 6 months for follow-up of chronic diastolic heart failure.   Adrian Prows, MD, [facility] 12/28/2020, 5:57 PM Office: [phone] Pager: [phone]

## 2021-01-04 ENCOUNTER — Other Ambulatory Visit: Payer: Self-pay | Admitting: Cardiology

## 2021-01-04 DIAGNOSIS — I10 Essential (primary) hypertension: Secondary | ICD-10-CM

## 2021-01-04 DIAGNOSIS — I5032 Chronic diastolic (congestive) heart failure: Secondary | ICD-10-CM

## 2021-06-30 ENCOUNTER — Encounter: Payer: Self-pay | Admitting: Cardiology

## 2021-06-30 ENCOUNTER — Ambulatory Visit: Payer: Medicare Other | Admitting: Cardiology

## 2021-06-30 ENCOUNTER — Other Ambulatory Visit: Payer: Self-pay

## 2021-06-30 VITALS — BP 130/80 | HR 67 | Temp 97.4°F | Resp 17 | Ht 60.0 in | Wt 209.2 lb

## 2021-06-30 DIAGNOSIS — K760 Fatty (change of) liver, not elsewhere classified: Secondary | ICD-10-CM

## 2021-06-30 DIAGNOSIS — I2510 Atherosclerotic heart disease of native coronary artery without angina pectoris: Secondary | ICD-10-CM

## 2021-06-30 DIAGNOSIS — I10 Essential (primary) hypertension: Secondary | ICD-10-CM

## 2021-06-30 DIAGNOSIS — I5032 Chronic diastolic (congestive) heart failure: Secondary | ICD-10-CM

## 2021-06-30 NOTE — Progress Notes (Signed)
Primary Physician/Referring:  Harrison Mons, PA  Patient ID: [name], female    DOB: 03-01-1941, 81 y.o.   MRN: [id number]  Chief Complaint  Patient presents with   Follow-up   chronic diastolic heart failure   moderate coronary artery disease   Hypertension   HPI:    NAKISHA CHAI  is a 81 y.o. ffemale patient with  morbid obesity, nonischemic cardiomyopathy with EF 40% by echo in June 2019 but normal by medical therapy, moderate coronary artery disease by coronary angiogram on 08/21/17 and revealed mid circumflex 50% and small with 2 distal diagonal 80% stenosis, mild aortic insufficiency and aortic stenosis, diabetes, hypertension, hyperlipidemia, former tobacco use with 15-pack-year history.    She has been active still recuperating from the left knee arthroplasty done in November 2021 and still has mild discomfort and is using a cane to support herself.  No significant change in her weight.  She has not had any chest pain, dyspnea has remained stable.  She does have mild chronic leg edema.  No PND or orthopnea.  This is 36-monthoffice visit.   Past Medical History:  Diagnosis Date   Anginal pain ([facility]    "small pain?nerves in chest"started 10/05/13    [place]    cleared by cardiology 3/15   Anxiety    rx given recent not taken yet   Aortic insufficiency    mild on 9/11 cath   CAD (coronary artery disease)    nonobstructive let heart cath 3/11: 40% ostial D1, 30% mid CFX   CHF (congestive heart failure) ([facility] [place]    Colon polyps 02/01/2009    MULTIPLE FRAGMENTS OF TUBULAR ADENOMAS   Diabetes mellitus    Frozen shoulder    GERD (gastroesophageal reflux disease)    occ   History of blood transfusion    pregnancy   HLD (hyperlipidemia)    HTN (hypertension)    ACEI cough   Hx of cardiovascular stress test    Lexiscan Myoview (10/2013):  Fixed ant defect most c/w breast attenuation, cannot exclude apical infarct; no ischemia, EF 46%; Low Risk    Nonischemic cardiomyopathy ([place]    Ech 3/11 difficult study, moderate aortic insuficiency noted. Left evntriculogram 3/11   Obese    Osteoarthritis    Palpitation    PACs noted on telemetry while in the hospital   Pneumonia    hx   Stress incontinence    Past Surgical History:  Procedure Laterality Date   ABDOMINAL HYSTERECTOMY     APPENDECTOMY     BLADDER NECK SUSPENSION  85   CARDIAC CATHETERIZATION     CHOLECYSTECTOMY N/A 08/24/2020   Procedure: LAPAROSCOPIC CHOLECYSTECTOMY;  Surgeon: CErroll Luna MD;  [facility];  Service: General;  Laterality: N/A;   EYE SURGERY     HAND SURGERY     right "had knots cut out"   KNEE ARTHROPLASTY Right 12/09/2013   Procedure: COMPUTER ASSISTED Right TOTAL KNEE ARTHROPLASTY;  Surgeon: MMarybelle Killings MD;  [place] [facility]  Service: Orthopedics;  Laterality: Right;   LEFT HEART CATH AND CORONARY ANGIOGRAPHY N/A 08/15/2017   Procedure: LEFT HEART CATH AND CORONARY ANGIOGRAPHY;  Surgeon: VJettie Booze MD;  [facility];  Service: Cardiovascular;  Laterality: N/A;   TOTAL KNEE ARTHROPLASTY Left 05/13/2020   Procedure: LEFT TOTAL KNEE ARTHROPLASTY;  Surgeon: YMarybelle Killings MD;  [place] [facility]  Service: Orthopedics;  Laterality: Left;   TUBAL LIGATION  Family History  Problem Relation Age of Onset   Heart attack Father    Heart attack Sister    Stroke Mother 73   Diabetes Sister    Heart disease Sister    Diabetes Brother    Colon cancer Brother    Breast cancer Neg Hx     Social History   Tobacco Use   Smoking status: Former    Packs/day: 1.00    Years: 15.00    Pack years: 15.00    Types: Cigarettes    Quit date: 10/10/1983    Years since quitting: 37.7   Smokeless tobacco: Never   Tobacco comments:    Quit 1970s  Substance Use Topics   Alcohol use: No    Alcohol/week: 0.0 standard drinks   ROS  Review of Systems  Cardiovascular:  Positive for dyspnea on exertion and leg swelling. Negative for  chest pain.  Musculoskeletal:  Positive for joint pain.  Gastrointestinal:  Negative for melena.  Objective  Blood pressure 130/80, pulse 67, temperature (!) 97.4 F (36.3 C), temperature source Temporal, resp. rate 17, height 5' (1.524 m), weight 209 lb 3.2 oz (94.9 kg), SpO2 100 %.  Vitals with BMI 06/30/2021 12/28/2020 09/27/2020  Height 5' 0"  5' 0"  5' 0"   Weight 209 lbs 3 oz 208 lbs 227 lbs  BMI 40.86 16.96 78.93  Systolic 810 175 102  Diastolic 80 57 73  Pulse 67 63 78     Physical Exam Constitutional:      Comments: Morbidly obese in no acute distress.  Neck:     Vascular: No carotid bruit or JVD.  Cardiovascular:     Rate and Rhythm: Normal rate and regular rhythm.     Pulses:          Dorsalis pedis pulses are 2+ on the right side and 2+ on the left side.       Posterior tibial pulses are 2+ on the right side and 2+ on the left side.     Heart sounds: Murmur heard.  Early systolic murmur is present with a grade of 2/6 at the upper right sternal border.    No gallop.  Pulmonary:     Effort: Pulmonary effort is normal.     Breath sounds: Normal breath sounds.  Abdominal:     General: Bowel sounds are normal.     Palpations: Abdomen is soft.     Comments: Obese. Pannus present  Musculoskeletal:     Cervical back: Neck supple.     Right lower leg: Edema (1-2+ below the pitting edema stable) present.     Left lower leg: Edema (1-2+ below the pitting edema stable) present.     Comments: Left knee post arthroplasty edema and tenderness. No signs of inflammation.    Laboratory examination:   Recent Labs    08/23/20 1548 08/24/20 0348 08/25/20 0340  NA 139 138 135  K 3.7 3.5 3.5  CL 106 109 107  CO2 19* 18* 21*  GLUCOSE 89 95 82  BUN 10 14 20   CREATININE 0.56 0.62 0.67  CALCIUM 8.9 8.4* 8.0*  GFRNONAA >60 >60 >60   CrCl cannot be calculated (Patient's most recent lab result is older than the maximum 21 days allowed.).  CMP Latest Ref Rng & Units 08/25/2020 08/24/2020  08/23/2020  Glucose 70 - 99 mg/dL 82 95 89  BUN 8 - 23 mg/dL 20 14 10   Creatinine 0.44 - 1.00 mg/dL 0.67 0.62 0.56  Sodium 135 - 145  mmol/L 135 138 139  Potassium 3.5 - 5.1 mmol/L 3.5 3.5 3.7  Chloride 98 - 111 mmol/L 107 109 106  CO2 22 - 32 mmol/L 21(L) 18(L) 19(L)  Calcium 8.9 - 10.3 mg/dL 8.0(L) 8.4(L) 8.9  Total Protein 6.5 - 8.1 g/dL 5.7(L) 6.1(L) -  Total Bilirubin 0.3 - 1.2 mg/dL 1.0 1.3(H) -  Alkaline Phos 38 - 126 U/L 40 35(L) -  AST 15 - 41 U/L 28 14(L) -  ALT 0 - 44 U/L 16 11 -   CBC Latest Ref Rng & Units 08/26/2020 08/25/2020 08/24/2020  WBC 4.0 - 10.5 K/uL 12.1(H) 15.1(H) 19.7(H)  Hemoglobin 12.0 - 15.0 g/dL 10.7(L) 10.6(L) 12.3  Hematocrit 36.0 - 46.0 % 33.4(L) 33.3(L) 39.1  Platelets 150 - 400 K/uL 225 183 208   glycosylated hemoglobin   External labs:   Labs 06/09/2021:  Cholesterol, Total 100 - 199 mg/dL 134   Triglycerides 0 - 149 mg/dL  102   HDL >39 mg/dL   68   VLDL Cholesterol Cal 5 - 40 mg/dL 19   LDL 0 - 99 mg/dL   47   LDL/HDL Ratio 0.0 - 3.2 ratio  0.7    Glucose 70 - 99 mg/dL  95  BUN 8 - 27 mg/dL   22  Creatinine 0.57 - 1.00 mg/dL 0.78  eGFR >59 mL/min/1.73  77  Sodium 134 - 144 mmol/L  139  Potassium 3.5 - 5.2 mmol/L  4.8  ALT elevated      65  A1c      6.0%  Medications and allergies   Allergies  Allergen Reactions   Iodine Hives and Swelling   Shellfish Allergy Swelling and Other (See Comments)    Swelling - throat and lips   Topiramate     Other reaction(s): Chest Pain   Metamucil [Psyllium]    Lisinopril Other (See Comments)    cough   Penicillins Hives, Rash and Other (See Comments)    Has patient had a PCN reaction causing immediate rash, facial/tongue/throat swelling, SOB or lightheadedness with hypotension: Unknown Has patient had a PCN reaction causing severe rash involving mucus membranes or skin necrosis: No Has patient had a PCN reaction that required hospitalization: No Has patient had a PCN reaction occurring within the  last 10 years: No If all of the above answers are "NO", then may proceed with Cephalosporin use.    Tramadol Other (See Comments)    confusion     Current Outpatient Medications on File Prior to Visit  Medication Sig Dispense Refill   acetaminophen (TYLENOL) 325 MG tablet Take 2 tablets (650 mg total) by mouth every 6 (six) hours as needed.     alendronate (FOSAMAX) 70 MG tablet Take 70 mg by mouth every Monday. Take with a full glass of water on an empty stomach. Take on mondays     Ascorbic Acid (VITAMIN C PO) Take 1 tablet by mouth daily.     aspirin EC 81 MG tablet Take 81 mg by mouth daily. Swallow whole.     carvedilol (COREG) 6.25 MG tablet Take 6.25 mg by mouth 2 (two) times daily.     Cholecalciferol (VITAMIN D) 2000 units CAPS Take 2,000 Units by mouth daily.     diazepam (VALIUM) 2 MG tablet Take 1 mg by mouth daily as needed for anxiety.     EPINEPHrine 0.3 mg/0.3 mL IJ SOAJ injection Inject 0.3 mLs (0.3 mg total) into the muscle as needed for anaphylaxis. 1 Device 0  furosemide (LASIX) 20 MG tablet Take 20 mg by mouth as needed.     losartan (COZAAR) 100 MG tablet TAKE 1 TABLET(100 MG) BY MOUTH DAILY 90 tablet 3   metFORMIN (GLUCOPHAGE-XR) 500 MG 24 hr tablet Take 500 mg by mouth daily.     oxyCODONE (OXY IR/ROXICODONE) 5 MG immediate release tablet Take 1 tablet (5 mg total) by mouth every 6 (six) hours as needed for moderate pain, severe pain or breakthrough pain (pain not releieved by tylenol). 30 tablet 0   potassium chloride SA (KLOR-CON) 20 MEQ tablet Take 20 mEq by mouth daily.     simvastatin (ZOCOR) 40 MG tablet Take 40 mg by mouth at bedtime.     spironolactone (ALDACTONE) 25 MG tablet TAKE 1 TABLET(25 MG) BY MOUTH DAILY 90 tablet 2   Calcium Carb-Cholecalciferol (CALCIUM 600/VITAMIN D3 PO) Take 1 tablet by mouth daily.  (Patient not taking: Reported on 06/30/2021)     No current facility-administered medications on file prior to visit.    Radiology:   No results  found.  Cardiac Studies:   Stress nuclear study 11/05/2013:  Exercise Capacity:  Lexiscan with no exercise. BP Response:  Normal blood pressure response. Clinical Symptoms:  There is dyspnea. ECG Impression:  No significant ECG changes with Lexiscan. Comparison with Prior Nuclear Study: No images to compare to, however the clinical repeat appears to be similar. Overall Impression:  Low risk stress nuclear study With mild apical breast attenuation. Cannot exclude small apical infarct. LV Wall Motion:  Unable to adequately it says the overall cardiac function as the gated images were not adequately established to capture wall motion.  Coronary angiogram 07/2017:  Mid circumflex 50%, D1 ostial 50% and mid 80%, very small. Mild noncritical CAD other vessels. EF 45%.  Echocardiogram 08/23/2020:     1. Left ventricular ejection fraction, by estimation, is 50 to 55%. The left ventricle has low normal function. The left ventricle has no regional wall motion abnormalities. There is mild left ventricular hypertrophy. Left ventricular diastolic parameters were normal.  2. Right ventricular systolic function is normal. The right ventricular size is normal.  3. Left atrial size was mildly dilated.  4. The mitral valve is normal in structure. Trivial mitral valve regurgitation. No evidence of mitral stenosis.  5. The aortic valve was not well visualized. Aortic valve regurgitation is mild. Mild aortic valve stenosis.  6. The inferior vena cava is normal in size with greater than 50% respiratory variability, suggesting right atrial pressure of 3 mmHg.  EKG  EKG 06/30/2021: Normal sinus rhythm at rate of 58 bpm, left axis deviation, left anterior fascicular block.  IVCD, LVH.  No significant change from 12/28/2020.  Assessment     ICD-10-CM   1. Chronic diastolic heart failure (HCC)  I50.32 EKG 12-Lead    2. Coronary artery disease involving native coronary artery of native heart without angina pectoris   I25.10     3. Essential hypertension  I10 EKG 12-Lead    4. Fatty liver  K76.0     5. Class 3 severe obesity due to excess calories with serious comorbidity and body mass index (BMI) of 40.0 to 44.9 in adult ([place])  E66.01    Z68.41       No orders of the defined types were placed in this encounter.    Medications Discontinued During This Encounter  Medication Reason   nitroGLYCERIN (NITROSTAT) 0.4 MG SL tablet     Discontinued by PCP due to recurrent UTI.  Recommendations:   LIND AUSLEY  is a 81 y.o.female patient with  morbid obesity, nonischemic cardiomyopathy with EF 40% by echo in June 2019 but normal by medical therapy, moderate coronary artery disease by coronary angiogram on 08/21/17 and revealed mid circumflex 50% and small with 2 distal diagonal 80% stenosis, mild aortic insufficiency and aortic stenosis, diabetes, hypertension, hyperlipidemia, former tobacco use with 15-pack-year history.  She is still recuperating from the left knee arthroplasty that was done in November 2021 and is still left knee pain.  Leg edema has remained stable.  Dyspnea has remained stable without PND or orthopnea.  There is no clinical evidence of heart failure.  No significant change in her physical exam.  External labs reviewed, diabetes is well controlled, blood pressure is also well controlled.  We discussed regarding weight loss again, her elevated ALT is related to fatty liver by scans that were done in the hospital previously.  We discussed regarding primary prevention of cirrhosis of liver as well.  She appears motivated for weight loss.  From cardiac standpoint she has not had any recurrence of angina pectoris, she only has mild to moderate coronary disease.  Lipids are well controlled.  I will see her back in a year or sooner if problems.    Adrian Prows, MD, [facility] 06/30/2021, 11:52 AM Office: [phone] Pager: [phone]

## 2021-08-31 ENCOUNTER — Encounter: Payer: Self-pay | Admitting: Gastroenterology

## 2021-09-25 ENCOUNTER — Encounter: Payer: Self-pay | Admitting: Gastroenterology

## 2021-09-25 ENCOUNTER — Ambulatory Visit: Payer: Medicare Other | Admitting: Gastroenterology

## 2021-09-25 VITALS — BP 106/74 | HR 70 | Ht 60.0 in | Wt 212.5 lb

## 2021-09-25 DIAGNOSIS — K219 Gastro-esophageal reflux disease without esophagitis: Secondary | ICD-10-CM

## 2021-09-25 DIAGNOSIS — K5909 Other constipation: Secondary | ICD-10-CM

## 2021-09-25 DIAGNOSIS — R112 Nausea with vomiting, unspecified: Secondary | ICD-10-CM | POA: Diagnosis not present

## 2021-09-25 NOTE — Progress Notes (Signed)
? ? ?History of Present Illness: This is an 81 year old female referred by Julie Mons, PA for the evaluation of GERD, nausea, vomiting.  She was started on pantoprazole 20 mg daily and her symptoms have resolved.  She takes Fosamax every Monday.  She relates ongoing difficulties with constipation and frequent straining.  She states she tried MiraLAX however 1 scoop daily led to loose stools so she discontinued it. ? ? ? ?Allergies  ?Allergen Reactions  ? Iodine Hives and Swelling  ? Shellfish Allergy Swelling and Other (See Comments)  ?  Swelling - throat and lips  ? Topiramate   ?  Other reaction(s): Chest Pain  ? Metamucil [Psyllium]   ? Lisinopril Other (See Comments)  ?  cough  ? Penicillins Hives, Rash and Other (See Comments)  ?  Has patient had a PCN reaction causing immediate rash, facial/tongue/throat swelling, SOB or lightheadedness with hypotension: Unknown ?Has patient had a PCN reaction causing severe rash involving mucus membranes or skin necrosis: No ?Has patient had a PCN reaction that required hospitalization: No ?Has patient had a PCN reaction occurring within the last 10 years: No ?If all of the above answers are "NO", then may proceed with Cephalosporin use. ?  ? Tramadol Other (See Comments)  ?  confusion  ? ?Outpatient Medications Prior to Visit  ?Medication Sig Dispense Refill  ? acetaminophen (TYLENOL) 325 MG tablet Take 2 tablets (650 mg total) by mouth every 6 (six) hours as needed.    ? alendronate (FOSAMAX) 70 MG tablet Take 70 mg by mouth every Monday. Take with a full glass of water on an empty stomach. Take on mondays    ? aspirin EC 81 MG tablet Take 81 mg by mouth daily. Swallow whole.    ? Calcium Carb-Cholecalciferol (CALCIUM 600/VITAMIN D3 PO) Take 1 tablet by mouth daily.    ? carvedilol (COREG) 6.25 MG tablet Take 6.25 mg by mouth 2 (two) times daily.    ? Cholecalciferol (VITAMIN D) 2000 units CAPS Take 2,000 Units by mouth daily.    ? diazepam (VALIUM) 2 MG tablet Take 1  mg by mouth daily as needed for anxiety.    ? furosemide (LASIX) 20 MG tablet Take 20 mg by mouth as needed.    ? HYDROcodone-acetaminophen (NORCO/VICODIN) 5-325 MG tablet Take 1 tablet by mouth every 6 (six) hours as needed for moderate pain.    ? losartan (COZAAR) 100 MG tablet TAKE 1 TABLET(100 MG) BY MOUTH DAILY 90 tablet 3  ? metFORMIN (GLUCOPHAGE-XR) 500 MG 24 hr tablet Take 500 mg by mouth daily.    ? pantoprazole (PROTONIX) 20 MG tablet Take 20 mg by mouth daily.    ? potassium chloride SA (KLOR-CON) 20 MEQ tablet Take 20 mEq by mouth daily.    ? simvastatin (ZOCOR) 40 MG tablet Take 40 mg by mouth at bedtime.    ? spironolactone (ALDACTONE) 25 MG tablet TAKE 1 TABLET(25 MG) BY MOUTH DAILY 90 tablet 2  ? Ascorbic Acid (VITAMIN C PO) Take 1 tablet by mouth daily. (Patient not taking: Reported on 09/25/2021)    ? EPINEPHrine 0.3 mg/0.3 mL IJ SOAJ injection Inject 0.3 mLs (0.3 mg total) into the muscle as needed for anaphylaxis. (Patient not taking: Reported on 09/25/2021) 1 Device 0  ? oxyCODONE (OXY IR/ROXICODONE) 5 MG immediate release tablet Take 1 tablet (5 mg total) by mouth every 6 (six) hours as needed for moderate pain, severe pain or breakthrough pain (pain not releieved by  tylenol). (Patient not taking: Reported on 09/25/2021) 30 tablet 0  ? ?No facility-administered medications prior to visit.  ? ?Past Medical History:  ?Diagnosis Date  ? Anginal pain ([place])   ? "small pain?nerves in chest"started 10/05/13   ? Anginal pain ([place])   ? cleared by cardiology 3/15  ? Anxiety   ? rx given recent not taken yet  ? Aortic insufficiency   ? mild on 9/11 cath  ? CAD (coronary artery disease)   ? nonobstructive let heart cath 3/11: 40% ostial D1, 30% mid CFX  ? CHF (congestive heart failure) ([place])   ? Colon polyps 02/01/2009  ?  MULTIPLE FRAGMENTS OF TUBULAR ADENOMAS  ? Diabetes mellitus   ? Frozen shoulder   ? GERD (gastroesophageal reflux disease)   ? occ  ? History of blood transfusion   ? pregnancy  ? HLD  (hyperlipidemia)   ? HTN (hypertension)   ? ACEI cough  ? Hx of cardiovascular stress test   ? Lexiscan Myoview (10/2013):  Fixed ant defect most c/w breast attenuation, cannot exclude apical infarct; no ischemia, EF 46%; Low Risk  ? Nonischemic cardiomyopathy ([place])   ? Ech 3/11 difficult study, moderate aortic insuficiency noted. Left evntriculogram 3/11  ? Obese   ? Osteoarthritis   ? Palpitation   ? PACs noted on telemetry while in the hospital  ? Pneumonia   ? hx  ? Stress incontinence   ? ?Past Surgical History:  ?Procedure Laterality Date  ? ABDOMINAL HYSTERECTOMY    ? APPENDECTOMY    ? BLADDER NECK SUSPENSION  85  ? CARDIAC CATHETERIZATION    ? CHOLECYSTECTOMY N/A 08/24/2020  ? Procedure: LAPAROSCOPIC CHOLECYSTECTOMY;  Surgeon: Erroll Luna, MD;  [facility];  Service: General;  Laterality: N/A;  ? EYE SURGERY    ? HAND SURGERY    ? right "had knots cut out"  ? KNEE ARTHROPLASTY Right 12/09/2013  ? Procedure: COMPUTER ASSISTED Right TOTAL KNEE ARTHROPLASTY;  Surgeon: Marybelle Killings, MD;  Location: [facility];  Service: Orthopedics;  Laterality: Right;  ? LEFT HEART CATH AND CORONARY ANGIOGRAPHY N/A 08/15/2017  ? Procedure: LEFT HEART CATH AND CORONARY ANGIOGRAPHY;  Surgeon: Jettie Booze, MD;  Location: [facility];  Service: Cardiovascular;  Laterality: N/A;  ? TOTAL KNEE ARTHROPLASTY Left 05/13/2020  ? Procedure: LEFT TOTAL KNEE ARTHROPLASTY;  Surgeon: Marybelle Killings, MD;  Location: [facility];  Service: Orthopedics;  Laterality: Left;  ? TUBAL LIGATION    ? ?Social History  ? ?Socioeconomic History  ? Marital status: Married  ?  Spouse name: Not on file  ? Number of children: 3  ? Years of education: Not on file  ? Highest education level: Not on file  ?Occupational History  ? Not on file  ?Tobacco Use  ? Smoking status: Former  ?  Packs/day: 1.00  ?  Years: 15.00  ?  Pack years: 15.00  ?  Types: Cigarettes  ?  Quit date: 10/10/1983  ?  Years since quitting: 37.9  ? Smokeless tobacco: Never  ? Tobacco  comments:  ?  Quit 1970s  ?Vaping Use  ? Vaping Use: Never used  ?Substance and Sexual Activity  ? Alcohol use: No  ?  Alcohol/week: 0.0 standard drinks  ? Drug use: No  ? Sexual activity: Not on file  ?Other Topics Concern  ? Not on file  ?Social History Narrative  ? Not on file  ? ?Social Determinants of Health  ? ?Emergency planning/management officer  Strain: Not on file  ?Food Insecurity: Not on file  ?Transportation Needs: Not on file  ?Physical Activity: Not on file  ?Stress: Not on file  ?Social Connections: Not on file  ? ?Family History  ?Problem Relation Age of Onset  ? Stroke Mother 77  ? Heart attack Father   ? Heart attack Sister   ? Heart disease Sister   ? Diabetes Sister   ? Thyroid cancer Sister   ? Diabetes Brother   ? Colon cancer Brother   ? Breast cancer Neg Hx   ? Esophageal cancer Neg Hx   ? Pancreatic cancer Neg Hx   ? Stomach cancer Neg Hx   ? ?   ? ?Review of Systems: Pertinent positive and negative review of systems were noted in the above HPI section. All other review of systems were otherwise negative. ? ? ?Physical Exam: ?General: Well developed, well nourished, no acute distress ?Head: Normocephalic and atraumatic ?Eyes: Sclerae anicteric, EOMI ?Ears: Normal auditory acuity ?Mouth: Not examined, mask on during Covid-19 pandemic ?Neck: Supple, no masses or thyromegaly ?Lungs: Clear throughout to auscultation ?Heart: Regular rate and rhythm; no murmurs, rubs or bruits ?Abdomen: Soft, non tender and non distended. No masses, hepatosplenomegaly or hernias noted. Normal Bowel sounds ?Rectal: Not done ?Musculoskeletal: Symmetrical with no gross deformities  ?Skin: No lesions on visible extremities ?Pulses:  Normal pulses noted ?Extremities: No clubbing, cyanosis, edema or deformities noted ?Neurological: Alert oriented x 4, grossly nonfocal ?Cervical Nodes:  No significant cervical adenopathy ?Inguinal Nodes: No significant inguinal adenopathy ?Psychological:  Alert and cooperative. Normal mood and  affect ? ? ?Assessment and Recommendations: ? ?GERD, nausea, vomiting. R/O Fosamax esophagitis, reflux esophagitis.  Follow antireflux measures.  Take Fosamax with a full glass of water, fully upright.  Continue pantopra

## 2021-09-25 NOTE — Patient Instructions (Signed)
Patient advised to avoid spicy, acidic, citrus, chocolate, mints, fruit and fruit juices.  Limit the intake of caffeine, alcohol and Soda.  Don't exercise too soon after eating.  Don't lie down within 3-4 hours of eating.  Elevate the head of your bed. ? ?Start over the counter Miralax 1/2-1 scoop in 8 oz of water daily.  ? ?You have been scheduled for an endoscopy. Please follow written instructions given to you at your visit today. ?If you use inhalers (even only as needed), please bring them with you on the day of your procedure. ? ? ?The [facility] providers would like to encourage you to use [facility] to communicate with providers for non-urgent requests or questions.  Due to long hold times on the telephone, sending your provider a message by [facility] may be a faster and more efficient way to get a response.  Please allow 48 business hours for a response.  Please remember that this is for non-urgent requests.  ? ?Due to recent changes in healthcare laws, you may see the results of your imaging and laboratory studies on MyChart before your provider has had a chance to review them.  We understand that in some cases there may be results that are confusing or concerning to you. Not all laboratory results come back in the same time frame and the provider may be waiting for multiple results in order to interpret others.  Please give [place] 48 hours in order for your provider to thoroughly review all the results before contacting the office for clarification of your results.  ? ?Thank you for choosing me and [facility]. ? ?Malcolm T. Dagoberto Ligas., MD., [facility] ? ?

## 2021-09-27 IMAGING — CT CT ANGIO CHEST-ABD-PELV FOR DISSECTION W/ AND WO/W CM
2 of 7 series · 15 of 46 positions shown, 17 images · IV contrast (OMNIPAQUE 350)
Comparison: Abdomen and pelvis CT 01/22/2005

CLINICAL DATA: Abdominal pain with aortic dissection suspected

EXAM:
CT ANGIOGRAPHY CHEST, ABDOMEN AND PELVIS
TECHNIQUE: Non-contrast CT of the chest was initially obtained.

[Series 7: axial arterial · axial · arterial · 0.76mm/px · z∈[-608,-125]mm · 12 of 183 slices shown, 14 images]
[im 11/183  soft-tissue]
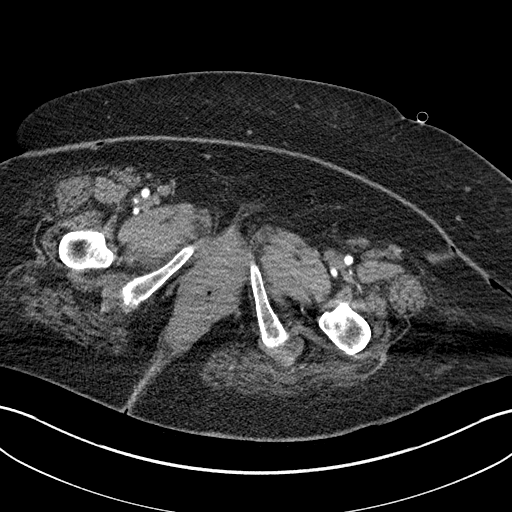
[im 11/183  bone]
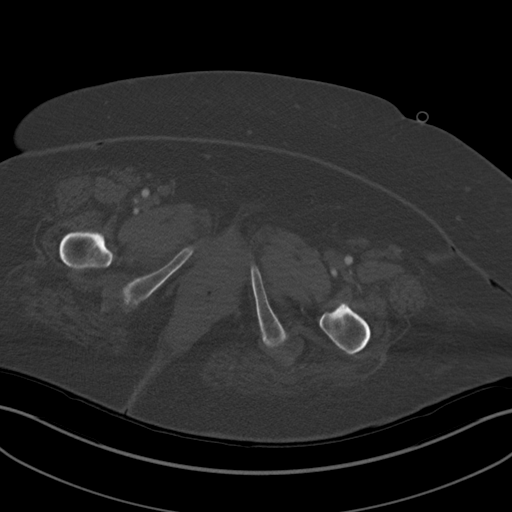
[im 31/183  soft-tissue]
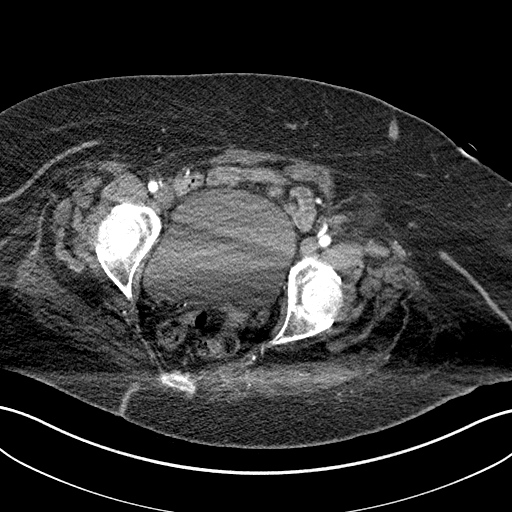
[im 41/183  soft-tissue]
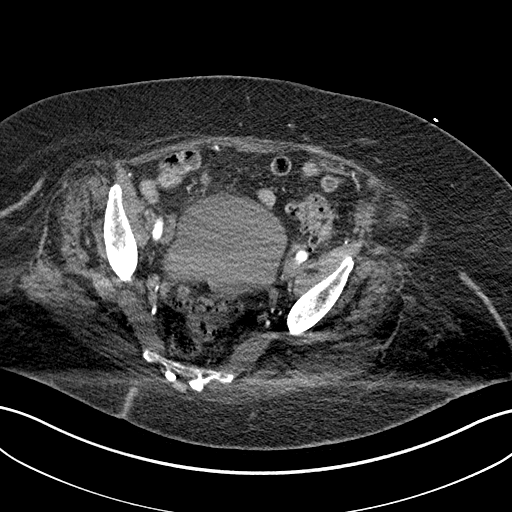
[im 51/183  soft-tissue]
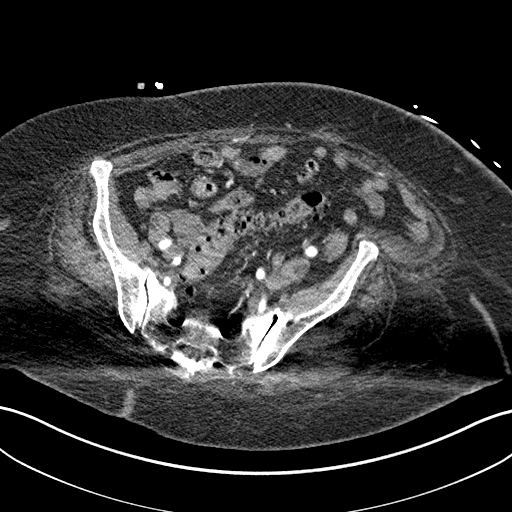
[im 71/183  soft-tissue]
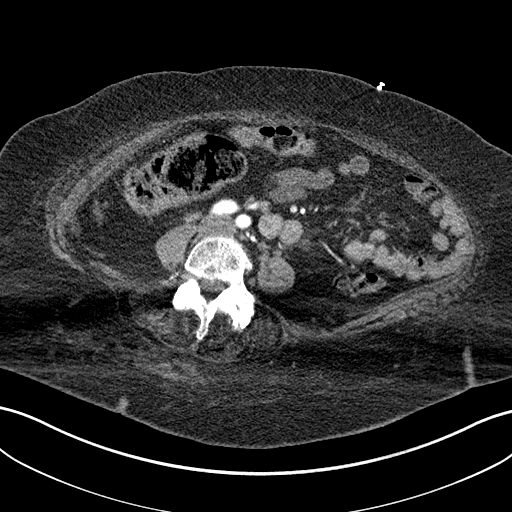
[im 81/183  soft-tissue]
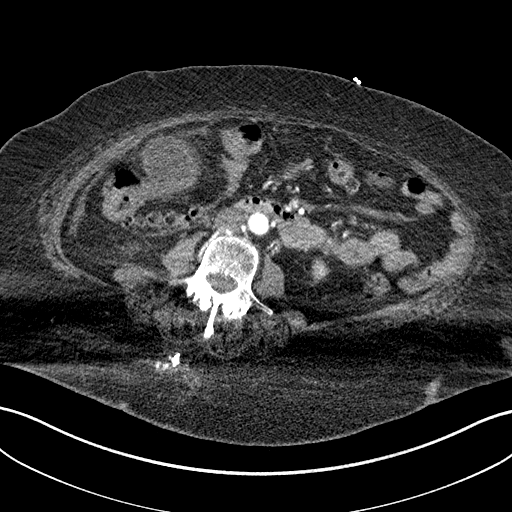
[im 102/183  soft-tissue]
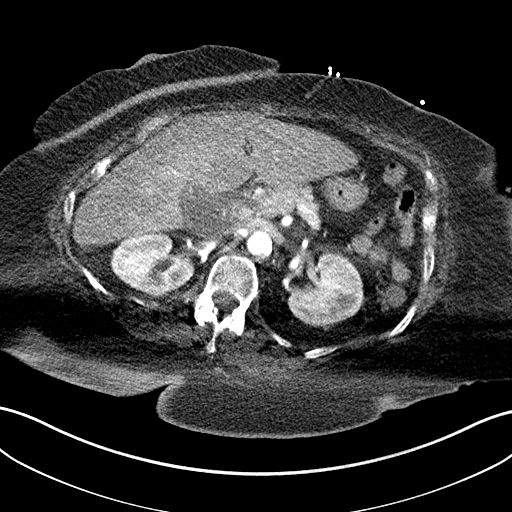
[im 112/183  soft-tissue]
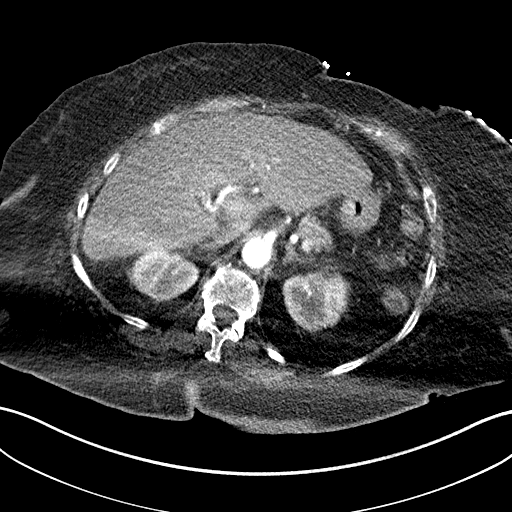
[im 132/183  soft-tissue]
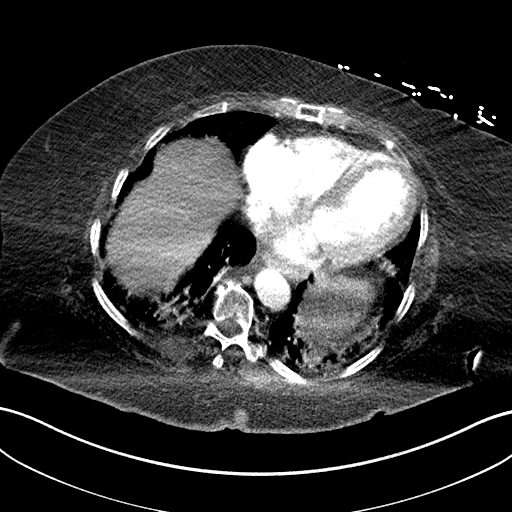
[im 132/183  bone]
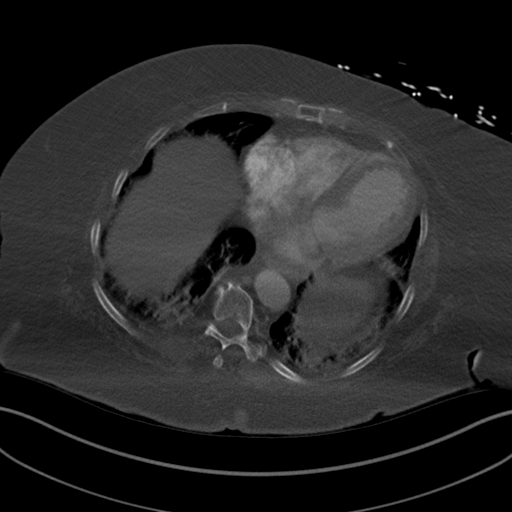
[im 142/183  soft-tissue]
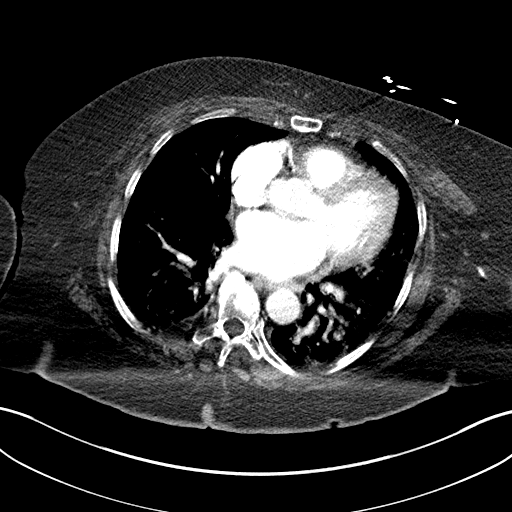
[im 152/183  soft-tissue]
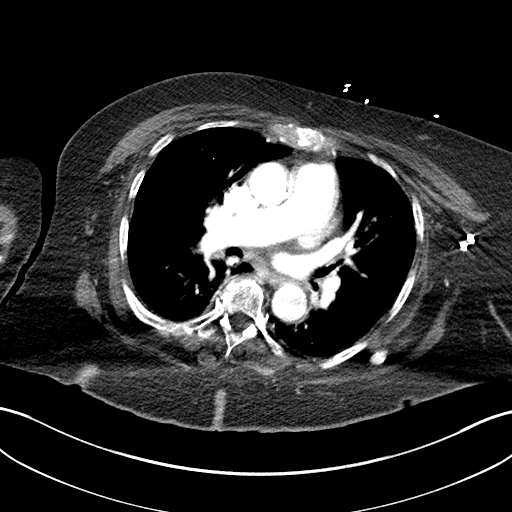
[im 172/183  soft-tissue]
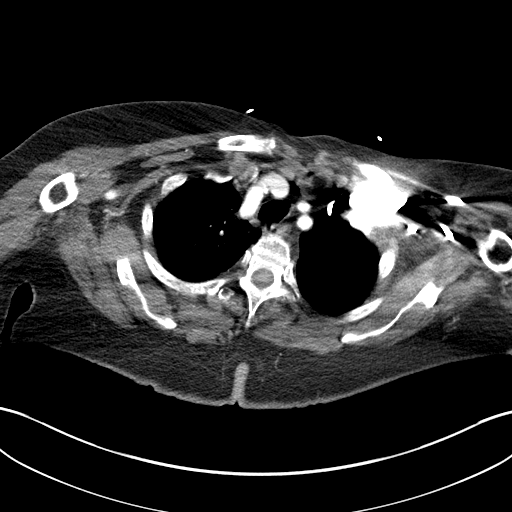

[Series 10: coronals · coronal · 0.86mm/px · 3 of 151 slices shown]
[im 38/151  soft-tissue]
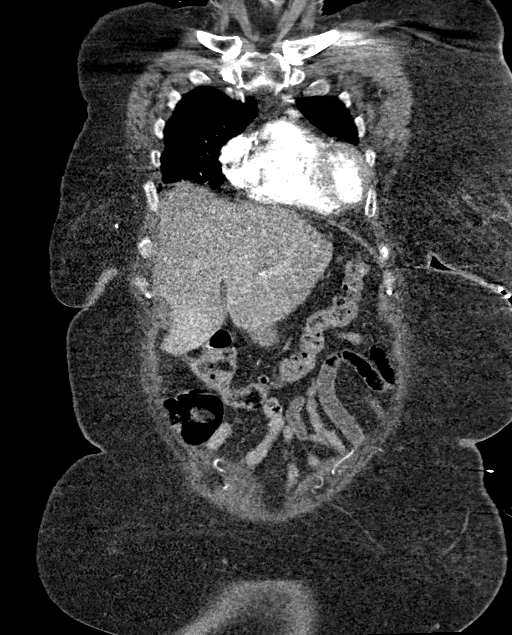
[im 76/151  soft-tissue]
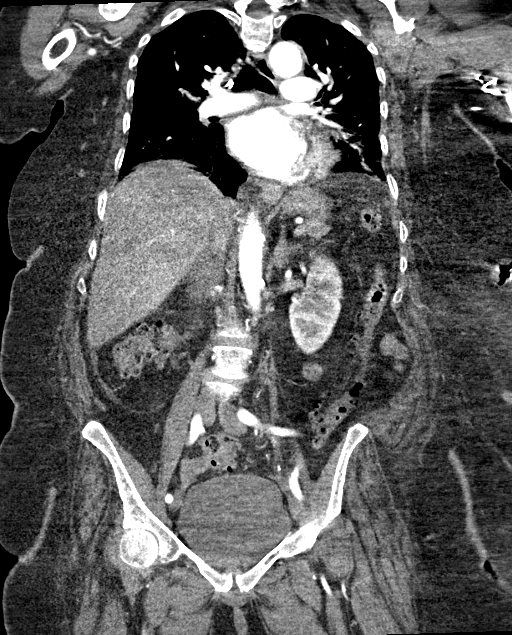
[im 113/151  soft-tissue]
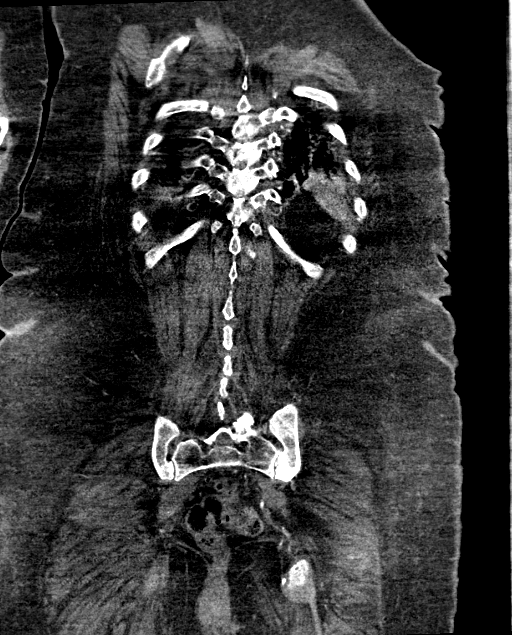

[15 of 46 positions shown; findings below may reference images not displayed]

Multidetector CT imaging through the chest, abdomen and pelvis was
performed using the standard protocol during bolus administration of
intravenous contrast. Multiplanar reconstructed images and MIPs were
obtained and reviewed to evaluate the vascular anatomy.

CONTRAST:  100mL OMNIPAQUE IOHEXOL 350 MG/ML SOLN
FINDINGS: CTA CHEST FINDINGS

Cardiovascular: Preferential opacification of the thoracic aorta. No
evidence of thoracic aortic aneurysm or dissection. Normal heart
size. No pericardial effusion. Aortic and coronary atheromatous
calcification.

Mediastinum/Nodes: 13 mm nodule in the left thyroid. No followup
recommended.(Ref: [HOSPITAL]. [DATE]): 143-50).

Lungs/Pleura: Mild dependent atelectasis. Assessment is limited by
respiratory motion. There is no edema, consolidation, effusion, or
pneumothorax.

Musculoskeletal: Spondylosis

Review of the MIP images confirms the above findings.

CTA ABDOMEN AND PELVIS FINDINGS

VASCULAR

Aorta: Overall mild atheromatous plaque.  No aneurysm or dissection.

Celiac: Plaque at the ostium without significant stenosis. No branch
occlusion, beading, or aneurysm.

SMA: Patent without evidence of aneurysm, dissection, vasculitis or
significant stenosis.

Renals: Atheromatous plaque at the left renal artery ostium. No
stenosis, beading, or are aneurysm.

IMA: Patent

Inflow: Atheromatous plaque.

Veins: Unremarkable in the arterial phase

Review of the MIP images confirms the above findings.

NON-VASCULAR

Hepatobiliary: Chronic liver steatosis. Higher density towards the
gallbladder fossa could be hyperemia or sparing.Distended
gallbladder with pericholecystic edema and wall thickening. No
calcified gallstone detected. No bile duct dilatation.

Pancreas: Unremarkable.

Spleen: Unremarkable.

Adrenals/Urinary Tract: Negative adrenals. No hydronephrosis or
stone. Unremarkable bladder.

Stomach/Bowel: No obstruction. No visible bowel inflammation.
Innumerable colonic diverticula.

Lymphatic: No mass or adenopathy.

Reproductive:Hysterectomy

Other: No ascites or pneumoperitoneum.

Musculoskeletal: No acute abnormalities. Sacroiliac osteoarthritis
with sclerosis. Lumbar spine degeneration with mild levoscoliosis.

Review of the MIP images confirms the above findings.
IMPRESSION: 1. Findings of acute cholecystitis but no calcified gallstones.
Suggest right upper quadrant ultrasound.
2. No acute aortic syndrome.
3.  Aortic Atherosclerosis ([id number]).
4. Extensive colonic diverticulosis.

## 2021-10-31 ENCOUNTER — Ambulatory Visit (AMBULATORY_SURGERY_CENTER): Payer: Medicare Other | Admitting: Gastroenterology

## 2021-10-31 ENCOUNTER — Encounter: Payer: Self-pay | Admitting: Gastroenterology

## 2021-10-31 VITALS — BP 147/64 | HR 62 | Temp 97.7°F | Resp 20 | Ht 60.0 in | Wt 212.0 lb

## 2021-10-31 DIAGNOSIS — K219 Gastro-esophageal reflux disease without esophagitis: Secondary | ICD-10-CM | POA: Diagnosis not present

## 2021-10-31 DIAGNOSIS — K449 Diaphragmatic hernia without obstruction or gangrene: Secondary | ICD-10-CM

## 2021-10-31 DIAGNOSIS — R112 Nausea with vomiting, unspecified: Secondary | ICD-10-CM

## 2021-10-31 MED ORDER — SODIUM CHLORIDE 0.9 % IV SOLN: 500.0000 mL | Freq: Once | INTRAVENOUS | Status: DC

## 2021-10-31 MED ORDER — PANTOPRAZOLE SODIUM 40 MG PO TBEC: 40.0000 mg | DELAYED_RELEASE_TABLET | Freq: Two times a day (BID) | ORAL | 7 refills | Status: DC

## 2021-10-31 NOTE — Patient Instructions (Signed)
Please read handouts provided. ?Continue present medications. ?Follow antireflux measures long term. ?Change pantoprazole to 40 mg twice daily. ?GI office visit in 6 weeks. ? ? ?YOU HAD AN ENDOSCOPIC PROCEDURE TODAY AT [facility]:   Refer to the procedure report that was given to you for any specific questions about what was found during the examination.  If the procedure report does not answer your questions, please call your gastroenterologist to clarify.  If you requested that your care partner not be given the details of your procedure findings, then the procedure report has been included in a sealed envelope for you to review at your convenience later. ? ?YOU SHOULD EXPECT: Some feelings of bloating in the abdomen. Passage of more gas than usual.  Walking can help get rid of the air that was put into your GI tract during the procedure and reduce the bloating. If you had a lower endoscopy (such as a colonoscopy or flexible sigmoidoscopy) you may notice spotting of blood in your stool or on the toilet paper. If you underwent a bowel prep for your procedure, you may not have a normal bowel movement for a few days. ? ?Please Note:  You might notice some irritation and congestion in your nose or some drainage.  This is from the oxygen used during your procedure.  There is no need for concern and it should clear up in a day or so. ? ?SYMPTOMS TO REPORT IMMEDIATELY: ? ?Following upper endoscopy (EGD) ? Vomiting of blood or coffee ground material ? New chest pain or pain under the shoulder blades ? Painful or persistently difficult swallowing ? New shortness of breath ? Fever of 100?F or higher ? Black, tarry-looking stools ? ?For urgent or emergent issues, a gastroenterologist can be reached at any hour by calling [phone]. ?Do not use MyChart messaging for urgent concerns.  ? ? ?DIET:  We do recommend a small meal at first, but then you may proceed to your regular diet.  Drink plenty of fluids  but you should avoid alcoholic beverages for 24 hours. ? ?ACTIVITY:  You should plan to take it easy for the rest of today and you should NOT DRIVE or use heavy machinery until tomorrow (because of the sedation medicines used during the test).   ? ?FOLLOW UP: ?Our staff will call the number listed on your records 48-72 hours following your procedure to check on you and address any questions or concerns that you may have regarding the information given to you following your procedure. If we do not reach you, we will leave a message.  We will attempt to reach you two times.  During this call, we will ask if you have developed any symptoms of COVID 19. If you develop any symptoms (ie: fever, flu-like symptoms, shortness of breath, cough etc.) before then, please call [phone].  If you test positive for Covid 19 in the 2 weeks post procedure, please call and report this information to [place].   ? ?If any biopsies were taken you will be contacted by phone or by letter within the next 1-3 weeks.  Please call us at [phone] if you have not heard about the biopsies in 3 weeks.  ? ? ?SIGNATURES/CONFIDENTIALITY: ?You and/or your care partner have signed paperwork which will be entered into your electronic medical record.  These signatures attest to the fact that that the information above on your After Visit Summary has been reviewed and is understood.  Full responsibility of  the confidentiality of this discharge information lies with you and/or your care-partner.  ?

## 2021-10-31 NOTE — Progress Notes (Signed)
Pt's states no medical or surgical changes since previsit or office visit. VS assessed by C.W 

## 2021-10-31 NOTE — Progress Notes (Signed)
? ?History & Physical ? ?Primary Care Physician:  Harrison Mons, PA ?Primary Gastroenterologist: Lucio Edward, MD ? ?CHIEF COMPLAINT:  GERD, nausea, vomiting ? ?HPI: Julie Graves is a 81 y.o. female with GERD, nausea, vomiting for EGD. ? ? ?Past Medical History:  ?Diagnosis Date  ? Anginal pain ([place])   ? "small pain?nerves in chest"started 10/05/13   ? Anginal pain ([place])   ? cleared by cardiology 3/15  ? Anxiety   ? rx given recent not taken yet  ? Aortic insufficiency   ? mild on 9/11 cath  ? CAD (coronary artery disease)   ? nonobstructive let heart cath 3/11: 40% ostial D1, 30% mid CFX  ? CHF (congestive heart failure) ([place])   ? Colon polyps 02/01/2009  ?  MULTIPLE FRAGMENTS OF TUBULAR ADENOMAS  ? Diabetes mellitus   ? Frozen shoulder   ? GERD (gastroesophageal reflux disease)   ? occ  ? History of blood transfusion   ? pregnancy  ? HLD (hyperlipidemia)   ? HTN (hypertension)   ? ACEI cough  ? Hx of cardiovascular stress test   ? Lexiscan Myoview (10/2013):  Fixed ant defect most c/w breast attenuation, cannot exclude apical infarct; no ischemia, EF 46%; Low Risk  ? Nonischemic cardiomyopathy ([place])   ? Ech 3/11 difficult study, moderate aortic insuficiency noted. Left evntriculogram 3/11  ? Obese   ? Osteoarthritis   ? Palpitation   ? PACs noted on telemetry while in the hospital  ? Pneumonia   ? hx  ? Stress incontinence   ? ? ?Past Surgical History:  ?Procedure Laterality Date  ? ABDOMINAL HYSTERECTOMY    ? APPENDECTOMY    ? BLADDER NECK SUSPENSION  85  ? CARDIAC CATHETERIZATION    ? CHOLECYSTECTOMY N/A 08/24/2020  ? Procedure: LAPAROSCOPIC CHOLECYSTECTOMY;  Surgeon: Erroll Luna, MD;  Location: [facility];  Service: General;  Laterality: N/A;  ? EYE SURGERY    ? HAND SURGERY    ? right "had knots cut out"  ? KNEE ARTHROPLASTY Right 12/09/2013  ? Procedure: COMPUTER ASSISTED Right TOTAL KNEE ARTHROPLASTY;  Surgeon: Marybelle Killings, MD;  Location: [facility];  Service: Orthopedics;  Laterality: Right;  ? LEFT HEART  CATH AND CORONARY ANGIOGRAPHY N/A 08/15/2017  ? Procedure: LEFT HEART CATH AND CORONARY ANGIOGRAPHY;  Surgeon: Jettie Booze, MD;  Location: [facility];  Service: Cardiovascular;  Laterality: N/A;  ? TOTAL KNEE ARTHROPLASTY Left 05/13/2020  ? Procedure: LEFT TOTAL KNEE ARTHROPLASTY;  Surgeon: Marybelle Killings, MD;  Location: [facility];  Service: Orthopedics;  Laterality: Left;  ? TUBAL LIGATION    ? ? ?Prior to Admission medications   ?Medication Sig Start Date End Date Taking? Authorizing Provider  ?acetaminophen (TYLENOL) 325 MG tablet Take 2 tablets (650 mg total) by mouth every 6 (six) hours as needed. 08/26/20  Yes Jill Alexanders, PA-C  ?alendronate (FOSAMAX) 70 MG tablet Take 70 mg by mouth every Monday. Take with a full glass of water on an empty stomach. Take on mondays   Yes [provider]  ?aspirin EC 81 MG tablet Take 81 mg by mouth daily. Swallow whole.   Yes [provider]  ?Calcium Carb-Cholecalciferol (CALCIUM 600/VITAMIN D3 PO) Take 1 tablet by mouth daily.   Yes [provider]  ?carvedilol (COREG) 6.25 MG tablet Take 6.25 mg by mouth 2 (two) times daily.   Yes [provider]  ?Cholecalciferol (VITAMIN D) 2000 units CAPS Take 2,000 Units by mouth daily.  Yes [provider]  ?losartan (COZAAR) 100 MG tablet TAKE 1 TABLET(100 MG) BY MOUTH DAILY 10/25/20  Yes Adrian Prows, MD  ?metFORMIN (GLUCOPHAGE-XR) 500 MG 24 hr tablet Take 500 mg by mouth daily. 07/08/19  Yes [provider]  ?pantoprazole (PROTONIX) 20 MG tablet Take 20 mg by mouth daily. 08/31/21  Yes [provider]  ?simvastatin (ZOCOR) 40 MG tablet Take 40 mg by mouth at bedtime.   Yes [provider]  ?spironolactone (ALDACTONE) 25 MG tablet TAKE 1 TABLET(25 MG) BY MOUTH DAILY 01/04/21  Yes Adrian Prows, MD  ?diazepam (VALIUM) 2 MG tablet Take 1 mg by mouth daily as needed for anxiety. ?Patient not taking: Reported on 10/31/2021 11/25/19   [provider]   ?furosemide (LASIX) 20 MG tablet Take 20 mg by mouth as needed. ?Patient not taking: Reported on 10/31/2021    [provider]  ?HYDROcodone-acetaminophen (NORCO/VICODIN) 5-325 MG tablet Take 1 tablet by mouth every 6 (six) hours as needed for moderate pain. ?Patient not taking: Reported on 10/31/2021    [provider]  ?potassium chloride SA (KLOR-CON) 20 MEQ tablet Take 20 mEq by mouth daily. ?Patient not taking: Reported on 10/31/2021 10/28/20   [provider]  ? ? ?Current Outpatient Medications  ?Medication Sig Dispense Refill  ? acetaminophen (TYLENOL) 325 MG tablet Take 2 tablets (650 mg total) by mouth every 6 (six) hours as needed.    ? alendronate (FOSAMAX) 70 MG tablet Take 70 mg by mouth every Monday. Take with a full glass of water on an empty stomach. Take on mondays    ? aspirin EC 81 MG tablet Take 81 mg by mouth daily. Swallow whole.    ? Calcium Carb-Cholecalciferol (CALCIUM 600/VITAMIN D3 PO) Take 1 tablet by mouth daily.    ? carvedilol (COREG) 6.25 MG tablet Take 6.25 mg by mouth 2 (two) times daily.    ? Cholecalciferol (VITAMIN D) 2000 units CAPS Take 2,000 Units by mouth daily.    ? losartan (COZAAR) 100 MG tablet TAKE 1 TABLET(100 MG) BY MOUTH DAILY 90 tablet 3  ? metFORMIN (GLUCOPHAGE-XR) 500 MG 24 hr tablet Take 500 mg by mouth daily.    ? pantoprazole (PROTONIX) 20 MG tablet Take 20 mg by mouth daily.    ? simvastatin (ZOCOR) 40 MG tablet Take 40 mg by mouth at bedtime.    ? spironolactone (ALDACTONE) 25 MG tablet TAKE 1 TABLET(25 MG) BY MOUTH DAILY 90 tablet 2  ? diazepam (VALIUM) 2 MG tablet Take 1 mg by mouth daily as needed for anxiety. (Patient not taking: Reported on 10/31/2021)    ? furosemide (LASIX) 20 MG tablet Take 20 mg by mouth as needed. (Patient not taking: Reported on 10/31/2021)    ? HYDROcodone-acetaminophen (NORCO/VICODIN) 5-325 MG tablet Take 1 tablet by mouth every 6 (six) hours as needed for moderate pain. (Patient not taking: Reported on 10/31/2021)     ? potassium chloride SA (KLOR-CON) 20 MEQ tablet Take 20 mEq by mouth daily. (Patient not taking: Reported on 10/31/2021)    ? ?Current Facility-Administered Medications  ?Medication Dose Route Frequency Provider Last Rate Last Admin  ? 0.9 %  sodium chloride infusion  500 mL Intravenous Once Ladene Artist, MD      ? ? ?Allergies as of 10/31/2021 - Review Complete 10/31/2021  ?Allergen Reaction Noted  ? Iodine Hives and Swelling 01/19/2009  ? Shellfish allergy Swelling and Other (See Comments) 11/27/2013  ? Topiramate  06/24/2019  ? Metamucil [  psyllium]  06/04/2019  ? Lisinopril Other (See Comments) 06/23/2013  ? Penicillins Hives, Rash, and Other (See Comments) 01/19/2009  ? Tramadol Other (See Comments) 11/27/2013  ? ? ?Family History  ?Problem Relation Age of Onset  ? Stroke Mother 79  ? Heart attack Father   ? Heart attack Sister   ? Heart disease Sister   ? Diabetes Sister   ? Thyroid cancer Sister   ? Diabetes Brother   ? Colon cancer Brother   ? Breast cancer Neg Hx   ? Esophageal cancer Neg Hx   ? Pancreatic cancer Neg Hx   ? Stomach cancer Neg Hx   ? ? ?Social History  ? ?Socioeconomic History  ? Marital status: Married  ?  Spouse name: Not on file  ? Number of children: 3  ? Years of education: Not on file  ? Highest education level: Not on file  ?Occupational History  ? Not on file  ?Tobacco Use  ? Smoking status: Former  ?  Packs/day: 1.00  ?  Years: 15.00  ?  Pack years: 15.00  ?  Types: Cigarettes  ?  Quit date: 10/10/1983  ?  Years since quitting: 38.0  ? Smokeless tobacco: Never  ? Tobacco comments:  ?  Quit 1970s  ?Vaping Use  ? Vaping Use: Never used  ?Substance and Sexual Activity  ? Alcohol use: No  ?  Alcohol/week: 0.0 standard drinks  ? Drug use: No  ? Sexual activity: Not on file  ?Other Topics Concern  ? Not on file  ?Social History Narrative  ? Not on file  ? ?Social Determinants of Health  ? ?Financial Resource Strain: Not on file  ?Food Insecurity: Not on file  ?Transportation Needs: Not  on file  ?Physical Activity: Not on file  ?Stress: Not on file  ?Social Connections: Not on file  ?Intimate Partner Violence: Not on file  ? ? ?Review of Systems: ? ?All systems reviewed an negative exce

## 2021-10-31 NOTE — Op Note (Signed)
[facility] ?Patient Name: Julie Graves ?Procedure Date: 10/31/2021 10:10 AM ?MRN: [id number] ?Endoscopist: Ladene Artist , MD ?Age: 81 ?Referring MD:  ?Date of Birth: 02/09/1941 ?Gender: Female ?Account #: [id number] ?Procedure:                Upper GI endoscopy ?Indications:              Gastroesophageal reflux disease, Nausea with  ?                          vomiting ?Medicines:                Monitored Anesthesia Care ?Procedure:                Pre-Anesthesia Assessment: ?                          - Prior to the procedure, a History and Physical  ?                          was performed, and patient medications and  ?                          allergies were reviewed. The patient's tolerance of  ?                          previous anesthesia was also reviewed. The risks  ?                          and benefits of the procedure and the sedation  ?                          options and risks were discussed with the patient.  ?                          All questions were answered, and informed consent  ?                          was obtained. Prior Anticoagulants: The patient has  ?                          taken no previous anticoagulant or antiplatelet  ?                          agents. ASA Grade Assessment: III - A patient with  ?                          severe systemic disease. After reviewing the risks  ?                          and benefits, the patient was deemed in  ?                          satisfactory condition to undergo the procedure. ?  After obtaining informed consent, the endoscope was  ?                          passed under direct vision. Throughout the  ?                          procedure, the patient's blood pressure, pulse, and  ?                          oxygen saturations were monitored continuously. The  ?                          [place] [id number] was introduced through  ?                          the mouth, and advanced to the second part  of  ?                          duodenum. The upper GI endoscopy was accomplished  ?                          without difficulty. The patient tolerated the  ?                          procedure well. ?Scope In: ?Scope Out: ?Findings:                 LA Grade B (one or more mucosal breaks greater than  ?                          5 mm, not extending between the tops of two mucosal  ?                          folds) esophagitis with no bleeding was found at  ?                          the gastroesophageal junction. ?                          The exam of the esophagus was otherwise normal. ?                          A medium-sized hiatal hernia was present. [place]  ?                          Grade IV. ?                          The exam of the stomach was otherwise normal. ?                          The duodenal bulb and second portion of the  ?                          duodenum were normal. ?  Complications:            No immediate complications. ?Estimated Blood Loss:     Estimated blood loss: none. ?Impression:               - LA Grade B reflux esophagitis with no bleeding. ?                          - Medium-sized hiatal hernia. ?                          - Normal duodenal bulb and second portion of the  ?                          duodenum. ?                          - No specimens collected. ?Recommendation:           - Patient has a contact number available for  ?                          emergencies. The signs and symptoms of potential  ?                          delayed complications were discussed with the  ?                          patient. Return to normal activities tomorrow.  ?                          Written discharge instructions were provided to the  ?                          patient. ?                          - Resume previous diet. ?                          - Follow antireflux measures long term. ?                          - Continue present medications. ?                          - Change pantoprazole  to 40 mg po bid, 1 year of  ?                          refills. ?                          - GI office visit in 6 weeks. ?Ladene Artist, MD ?10/31/2021 10:35:54 AM ?This report has been signed electronically. ?

## 2021-10-31 NOTE — Progress Notes (Signed)
Pt in recovery with monitors in place, VSS. Report given to receiving RN. Bite guard was placed with pt awake to ensure comfort. No dental or soft tissue damage noted. 

## 2021-11-01 ENCOUNTER — Telehealth: Payer: Self-pay

## 2021-11-01 NOTE — Telephone Encounter (Signed)
Patient has been scheduled for follow up with Dr. Fuller Plan on 12/19/21 at 1:50pm. Patient & husband aware. ?

## 2021-11-02 ENCOUNTER — Other Ambulatory Visit: Payer: Self-pay | Admitting: Cardiology

## 2021-11-02 ENCOUNTER — Telehealth: Payer: Self-pay | Admitting: *Deleted

## 2021-11-02 DIAGNOSIS — I5032 Chronic diastolic (congestive) heart failure: Secondary | ICD-10-CM

## 2021-11-02 DIAGNOSIS — I10 Essential (primary) hypertension: Secondary | ICD-10-CM

## 2021-11-02 NOTE — Telephone Encounter (Signed)
?  Follow up Call- ? ? ?  10/31/2021  ?  9:16 AM  ?Call back number  ?Post procedure Call Back phone  # [phone]  ?Permission to leave phone message Yes  ?  ? ?Patient questions: ? ?Do you have a fever, pain , or abdominal swelling? No. ?Pain Score  0 * ? ?Have you tolerated food without any problems? Yes. ? ?Have you been able to return to your normal activities? Yes. ? ?Do you have any questions about your discharge instructions: ?Diet   No. ?Medications  No. ?Follow up visit  No. ? ?Do you have questions or concerns about your Care? No. ? ?Actions: ?* If pain score is 4 or above: ?No action needed, pain <4. ? ? ?

## 2021-12-06 ENCOUNTER — Other Ambulatory Visit: Payer: Self-pay | Admitting: Cardiology

## 2021-12-06 DIAGNOSIS — I10 Essential (primary) hypertension: Secondary | ICD-10-CM

## 2021-12-06 DIAGNOSIS — I5042 Chronic combined systolic (congestive) and diastolic (congestive) heart failure: Secondary | ICD-10-CM

## 2021-12-15 ENCOUNTER — Ambulatory Visit: Payer: Medicare Other | Admitting: Gastroenterology

## 2021-12-19 ENCOUNTER — Ambulatory Visit: Payer: Medicare Other | Admitting: Gastroenterology

## 2021-12-19 ENCOUNTER — Encounter: Payer: Self-pay | Admitting: Gastroenterology

## 2021-12-19 VITALS — BP 110/60 | HR 70 | Ht 60.0 in | Wt 209.0 lb

## 2021-12-19 DIAGNOSIS — K449 Diaphragmatic hernia without obstruction or gangrene: Secondary | ICD-10-CM | POA: Diagnosis not present

## 2021-12-19 DIAGNOSIS — K5904 Chronic idiopathic constipation: Secondary | ICD-10-CM

## 2021-12-19 DIAGNOSIS — K2100 Gastro-esophageal reflux disease with esophagitis, without bleeding: Secondary | ICD-10-CM

## 2021-12-19 NOTE — Progress Notes (Signed)
    Assessment     GERD with LA Class B esophagitis and a medium sized hiatal hernia Constipation Personal history of adenomatous colon polyps, no longer in surveillance   Recommendations    Continue pantoprazole 40 mg bid for 3 months and then decrease to 40 mg daily.  Follow antireflux measures long-term. If symptoms not controlled on daily will resume bid.  Continue MiraLAX 1/2-1 scoop daily prn titrated REV in 1 year.   HPI    This is an 81 year old female returning for follow-up of GERD with LA class B esophagitis, medium sized hiatal hernia and a history of nausea and vomiting that has resolved.  Her symptoms are well controlled on her current regimen.  EGD May 2023 - LA Class B reflux esophagitis with no bleeding. - Medium-sized hiatal hernia. - Normal duodenal bulb and second portion of the duodenum.   Labs / Imaging       Latest Ref Rng & Units 08/25/2020    3:40 AM 08/24/2020    3:48 AM 08/22/2020   10:00 PM  Hepatic Function  Total Protein 6.5 - 8.1 g/dL 5.7  6.1  7.9   Albumin 3.5 - 5.0 g/dL 2.8  3.0  4.4   AST 15 - 41 U/L 28  14  25    ALT 0 - 44 U/L 16  11  16    Alk Phosphatase 38 - 126 U/L 40  35  38   Total Bilirubin 0.3 - 1.2 mg/dL 1.0  1.3  1.5   Bilirubin, Direct 0.0 - 0.2 mg/dL   0.4        Latest Ref Rng & Units 08/26/2020    3:34 AM 08/25/2020    3:40 AM 08/24/2020    3:48 AM  CBC  WBC 4.0 - 10.5 K/uL 12.1  15.1  19.7   Hemoglobin 12.0 - 15.0 g/dL 82.9  56.2  13.0   Hematocrit 36.0 - 46.0 % 33.4  33.3  39.1   Platelets 150 - 400 K/uL 225  183  208     Current Medications, Allergies, Past Medical History, Past Surgical History, Family History and Social History were reviewed in [employer] record.   Physical Exam: General: Well developed, well nourished, no acute distress Head: Normocephalic and atraumatic Eyes: Sclerae anicteric, EOMI Ears: Normal auditory acuity Mouth: Not examined Lungs: Clear throughout to  auscultation Heart: Regular rate and rhythm; no murmurs, rubs or bruits Abdomen: Soft, non tender and non distended. No masses, hepatosplenomegaly or hernias noted. Normal Bowel sounds Rectal: Not done Musculoskeletal: Symmetrical with no gross deformities  Pulses:  Normal pulses noted Extremities: No clubbing, cyanosis, edema or deformities noted Neurological: Alert oriented x 4, grossly nonfocal Psychological:  Alert and cooperative. Normal mood and affect   Michelene Keniston T. Russella Dar, MD 12/19/2021, 1:52 PM

## 2022-01-19 ENCOUNTER — Encounter: Payer: Self-pay | Admitting: Gastroenterology

## 2022-02-08 ENCOUNTER — Emergency Department (HOSPITAL_COMMUNITY): Payer: Medicare Other

## 2022-02-08 ENCOUNTER — Inpatient Hospital Stay (HOSPITAL_COMMUNITY)
Admission: EM | Admit: 2022-02-08 | Discharge: 2022-02-11 | DRG: 445 | Disposition: A | Discharge status: Home / Self Care | Payer: Medicare Other | Attending: Internal Medicine | Admitting: Internal Medicine

## 2022-02-08 ENCOUNTER — Encounter (HOSPITAL_COMMUNITY): Payer: Self-pay | Admitting: Emergency Medicine

## 2022-02-08 ENCOUNTER — Other Ambulatory Visit: Payer: Self-pay

## 2022-02-08 DIAGNOSIS — K8051 Calculus of bile duct without cholangitis or cholecystitis with obstruction: Principal | ICD-10-CM | POA: Diagnosis present

## 2022-02-08 DIAGNOSIS — I10 Essential (primary) hypertension: Secondary | ICD-10-CM | POA: Diagnosis present

## 2022-02-08 DIAGNOSIS — E119 Type 2 diabetes mellitus without complications: Secondary | ICD-10-CM | POA: Diagnosis present

## 2022-02-08 DIAGNOSIS — K831 Obstruction of bile duct: Secondary | ICD-10-CM | POA: Diagnosis present

## 2022-02-08 DIAGNOSIS — Z808 Family history of malignant neoplasm of other organs or systems: Secondary | ICD-10-CM

## 2022-02-08 DIAGNOSIS — I2510 Atherosclerotic heart disease of native coronary artery without angina pectoris: Secondary | ICD-10-CM | POA: Diagnosis present

## 2022-02-08 DIAGNOSIS — Z800 Family history of malignant neoplasm of digestive organs: Secondary | ICD-10-CM

## 2022-02-08 DIAGNOSIS — Z823 Family history of stroke: Secondary | ICD-10-CM

## 2022-02-08 DIAGNOSIS — Z91013 Allergy to seafood: Secondary | ICD-10-CM

## 2022-02-08 DIAGNOSIS — I509 Heart failure, unspecified: Secondary | ICD-10-CM | POA: Diagnosis present

## 2022-02-08 DIAGNOSIS — K219 Gastro-esophageal reflux disease without esophagitis: Secondary | ICD-10-CM | POA: Diagnosis present

## 2022-02-08 DIAGNOSIS — Z79899 Other long term (current) drug therapy: Secondary | ICD-10-CM

## 2022-02-08 DIAGNOSIS — Z880 Allergy status to penicillin: Secondary | ICD-10-CM

## 2022-02-08 DIAGNOSIS — Z833 Family history of diabetes mellitus: Secondary | ICD-10-CM

## 2022-02-08 DIAGNOSIS — Z7983 Long term (current) use of bisphosphonates: Secondary | ICD-10-CM

## 2022-02-08 DIAGNOSIS — Z9049 Acquired absence of other specified parts of digestive tract: Secondary | ICD-10-CM

## 2022-02-08 DIAGNOSIS — I110 Hypertensive heart disease with heart failure: Secondary | ICD-10-CM | POA: Diagnosis present

## 2022-02-08 DIAGNOSIS — Z7982 Long term (current) use of aspirin: Secondary | ICD-10-CM

## 2022-02-08 DIAGNOSIS — Z6841 Body Mass Index (BMI) 40.0 and over, adult: Secondary | ICD-10-CM

## 2022-02-08 DIAGNOSIS — Z8249 Family history of ischemic heart disease and other diseases of the circulatory system: Secondary | ICD-10-CM

## 2022-02-08 DIAGNOSIS — Z91041 Radiographic dye allergy status: Secondary | ICD-10-CM

## 2022-02-08 DIAGNOSIS — R1013 Epigastric pain: Principal | ICD-10-CM

## 2022-02-08 DIAGNOSIS — K838 Other specified diseases of biliary tract: Secondary | ICD-10-CM

## 2022-02-08 DIAGNOSIS — I428 Other cardiomyopathies: Secondary | ICD-10-CM | POA: Diagnosis present

## 2022-02-08 DIAGNOSIS — Z886 Allergy status to analgesic agent status: Secondary | ICD-10-CM

## 2022-02-08 DIAGNOSIS — Z90710 Acquired absence of both cervix and uterus: Secondary | ICD-10-CM

## 2022-02-08 DIAGNOSIS — Z87891 Personal history of nicotine dependence: Secondary | ICD-10-CM

## 2022-02-08 DIAGNOSIS — Z96653 Presence of artificial knee joint, bilateral: Secondary | ICD-10-CM | POA: Diagnosis present

## 2022-02-08 DIAGNOSIS — Z7984 Long term (current) use of oral hypoglycemic drugs: Secondary | ICD-10-CM

## 2022-02-08 LAB — BASIC METABOLIC PANEL
Anion gap: 7 (ref 5–15)
BUN: 16 mg/dL (ref 8–23)
CO2: 24 mmol/L (ref 22–32)
Calcium: 9.5 mg/dL (ref 8.9–10.3)
Chloride: 105 mmol/L (ref 98–111)
Creatinine, Ser: 0.73 mg/dL (ref 0.44–1.00)
GFR, Estimated: >60 mL/min (ref >60)
Glucose, Bld: 102 mg/dL — ABNORMAL HIGH (ref 70–99)
Potassium: 4.2 mmol/L (ref 3.5–5.1)
Sodium: 136 mmol/L (ref 135–145)

## 2022-02-08 LAB — CBC
HCT: 39.1 % (ref 36.0–46.0)
Hemoglobin: 12.5 g/dL (ref 12.0–15.0)
MCH: 31.6 pg (ref 26.0–34.0)
MCHC: 32 g/dL (ref 30.0–36.0)
MCV: 98.7 fL (ref 80.0–100.0)
Platelets: 246 10*3/uL (ref 150–400)
RBC: 3.96 MIL/uL (ref 3.87–5.11)
RDW: 13.1 % (ref 11.5–15.5)
WBC: 5.8 10*3/uL (ref 4.0–10.5)
nRBC: 0 % (ref 0.0–0.2)

## 2022-02-08 LAB — TROPONIN I (HIGH SENSITIVITY)
Troponin I (High Sensitivity): 9 ng/L (ref <18)
Troponin I (High Sensitivity): 16 ng/L (ref <18)

## 2022-02-08 LAB — HEPATIC FUNCTION PANEL
ALT: 102 U/L — ABNORMAL HIGH (ref 0–44)
AST: 91 U/L — ABNORMAL HIGH (ref 15–41)
Albumin: 3.5 g/dL (ref 3.5–5.0)
Alkaline Phosphatase: 114 U/L (ref 38–126)
Bilirubin, Direct: 1.2 mg/dL — ABNORMAL HIGH (ref 0.0–0.2)
Indirect Bilirubin: 1.3 mg/dL — ABNORMAL HIGH (ref 0.3–0.9)
Total Bilirubin: 2.5 mg/dL — ABNORMAL HIGH (ref 0.3–1.2)
Total Protein: 6.4 g/dL — ABNORMAL LOW (ref 6.5–8.1)

## 2022-02-08 LAB — BRAIN NATRIURETIC PEPTIDE: B Natriuretic Peptide: 154.4 pg/mL — ABNORMAL HIGH (ref 0.0–100.0)

## 2022-02-08 LAB — LIPASE, BLOOD: Lipase: 29 U/L (ref 11–51)

## 2022-02-08 MED ORDER — PANTOPRAZOLE SODIUM 40 MG PO TBEC
40.0000 mg | DELAYED_RELEASE_TABLET | Freq: Every day | ORAL | Status: DC
  Administered 2022-02-08 – 2022-02-09 (×2): 40 mg via ORAL
  Filled 2022-02-08 (×2): qty 1

## 2022-02-08 MED ORDER — ALUM & MAG HYDROXIDE-SIMETH 200-200-20 MG/5ML PO SUSP
15.0000 mL | Freq: Once | ORAL | Status: AC
  Administered 2022-02-08: 15 mL via ORAL
  Filled 2022-02-08: qty 30

## 2022-02-08 MED ORDER — ONDANSETRON 4 MG PO TBDP
4.0000 mg | ORAL_TABLET | Freq: Once | ORAL | Status: AC
  Administered 2022-02-08: 4 mg via ORAL
  Filled 2022-02-08: qty 1

## 2022-02-08 NOTE — ED Triage Notes (Signed)
Patient with central epigastric pain since Monday intermittently. Patient feels like her heart is fluttering, feels like she SHOB since waking up this am. Patient unable to describe the pain.

## 2022-02-08 NOTE — ED Provider Triage Note (Signed)
Emergency Medicine Provider Triage Evaluation Note  Julie Graves , a 81 y.o. female  was evaluated in triage.  Pt complains of epigastric pain, heart fluttering, shortness of breath since this morning.  She reports the epigastric pain is actually started since Sunday.  She reports that she had to force herself to vomit but has been feeling a little nauseous.  Review of Systems  Positive:  Negative:   Physical Exam  BP (!) 129/101 (BP Location: Right Arm)   Pulse 64   Temp 98.1 F (36.7 C) (Oral)   Resp (!) 22   SpO2 100%  Gen:   Awake, no distress   Resp:  Normal effort  MSK:   Moves extremities without difficulty  Other:  No epigastric tenderness. Heart RRR. NAD. Pulses equal and symmetric.   Medical Decision Making  Medically screening exam initiated at 2:31 PM.  Appropriate orders placed.  Julie Graves was informed that the remainder of the evaluation will be completed by another provider, this initial triage assessment does not replace that evaluation, and the importance of remaining in the ED until their evaluation is complete.  Will order Maalox and labs and imaging.   Sherrell Puller, PA-C 02/08/22 1432

## 2022-02-08 NOTE — ED Provider Notes (Signed)
[facility] EMERGENCY DEPARTMENT Provider Note   CSN: [id number] Arrival date & time: 02/08/22  1356     History  Chief Complaint  Patient presents with   Chest Pain   Abdominal Pain    Julie Graves is a 81 y.o. female with a past medical history of hypertension, congestive heart failure, type 2 diabetes, obesity and hyperlipidemia presenting today due to the complaint of epigastric pain.  She reports its been going on since Sunday.  On Monday it was on and off but Tuesday it was constant and very painful.  Wednesday morning she forced herself to vomit and felt slightly better.  She continues to have epigastric pain but this morning she also felt a fluttering in her heart.  She follows with GI for GERD and 2 months ago told that her GERD was "very bad and looked very bad in her esophagus."  Takes 40 mg of twice daily pantoprazole but admittedly misses the night dose on occasion.  No chest pain or shortness of breath.   Chest Pain Associated symptoms: abdominal pain   Abdominal Pain Associated symptoms: chest pain        Home Medications Prior to Admission medications   Medication Sig Start Date End Date Taking? Authorizing Provider  acetaminophen (TYLENOL) 325 MG tablet Take 2 tablets (650 mg total) by mouth every 6 (six) hours as needed. 08/26/20   Jill Alexanders, PA-C  alendronate (FOSAMAX) 70 MG tablet Take 70 mg by mouth every Monday. Take with a full glass of water on an empty stomach. Take on mondays    [provider]  aspirin EC 81 MG tablet Take 81 mg by mouth daily. Swallow whole.    [provider]  Calcium Carb-Cholecalciferol (CALCIUM 600/VITAMIN D3 PO) Take 1 tablet by mouth daily.    [provider]  carvedilol (COREG) 6.25 MG tablet Take 6.25 mg by mouth 2 (two) times daily.    [provider]  Cholecalciferol (VITAMIN D) 2000 units CAPS Take 2,000 Units by mouth daily.    [provider]   diazepam (VALIUM) 2 MG tablet Take 1 mg by mouth daily as needed for anxiety. 11/25/19   [provider]  furosemide (LASIX) 20 MG tablet Take 20 mg by mouth as needed.    [provider]  HYDROcodone-acetaminophen (NORCO/VICODIN) 5-325 MG tablet Take 1 tablet by mouth every 6 (six) hours as needed for moderate pain.    [provider]  losartan (COZAAR) 100 MG tablet TAKE 1 TABLET(100 MG) BY MOUTH DAILY 12/06/21   Adrian Prows, MD  metFORMIN (GLUCOPHAGE-XR) 500 MG 24 hr tablet Take 500 mg by mouth daily. 07/08/19   [provider]  pantoprazole (PROTONIX) 40 MG tablet Take 1 tablet (40 mg total) by mouth 2 (two) times daily before a meal. 10/31/21   Ladene Artist, MD  potassium chloride SA (KLOR-CON) 20 MEQ tablet Take 20 mEq by mouth daily. 10/28/20   [provider]  simvastatin (ZOCOR) 40 MG tablet Take 40 mg by mouth at bedtime.    [provider]  spironolactone (ALDACTONE) 25 MG tablet TAKE 1 TABLET(25 MG) BY MOUTH DAILY 11/03/21   Adrian Prows, MD      Allergies    Iodine, Shellfish allergy, Topiramate, Metamucil [psyllium], Lisinopril, Penicillins, and Tramadol    Review of Systems   Review of Systems  Cardiovascular:  Positive for chest pain.  Gastrointestinal:  Positive for abdominal pain.    Physical Exam  Updated Vital Signs BP 133/80 (BP Location: Right Arm)   Pulse (!) 54   Temp (!) 97.5 F (36.4 C) (Oral)   Resp 15   Ht 5' (1.524 m)   Wt 93.4 kg   SpO2 100%   BMI 40.23 kg/m  Physical Exam Vitals and nursing note reviewed.  Constitutional:      General: She is not in acute distress.    Appearance: Normal appearance. She is not ill-appearing.  HENT:     Head: Normocephalic and atraumatic.  Eyes:     General: No scleral icterus.    Conjunctiva/sclera: Conjunctivae normal.  Cardiovascular:     Rate and Rhythm: Regular rhythm. Bradycardia present.     Heart sounds: Normal heart sounds.  Pulmonary:     Effort:  Pulmonary effort is normal. No respiratory distress.  Abdominal:     Palpations: Abdomen is soft.     Tenderness: There is abdominal tenderness (epigastric).  Skin:    General: Skin is warm and dry.     Findings: No rash.  Neurological:     Mental Status: She is alert.  Psychiatric:        Mood and Affect: Mood normal.     ED Results / Procedures / Treatments   Labs (all labs ordered are listed, but only abnormal results are displayed) Labs Reviewed  BASIC METABOLIC PANEL - Abnormal; Notable for the following components:      Result Value   Glucose, Bld 102 (*)    All other components within normal limits  BRAIN NATRIURETIC PEPTIDE - Abnormal; Notable for the following components:   B Natriuretic Peptide 154.4 (*)    All other components within normal limits  HEPATIC FUNCTION PANEL - Abnormal; Notable for the following components:   Total Protein 6.4 (*)    AST 91 (*)    ALT 102 (*)    Total Bilirubin 2.5 (*)    Bilirubin, Direct 1.2 (*)    Indirect Bilirubin 1.3 (*)    All other components within normal limits  CBC  LIPASE, BLOOD  HEPATITIS PANEL, ACUTE  TROPONIN I (HIGH SENSITIVITY)  TROPONIN I (HIGH SENSITIVITY)    EKG EKG Interpretation  Date/Time:  Thursday February 08 2022 14:13:32 EDT Ventricular Rate:  60 PR Interval:  134 QRS Duration: 122 QT Interval:  432 QTC Calculation: 432 R Axis:   -26 Text Interpretation: Sinus rhythm with Premature supraventricular complexes Left ventricular hypertrophy with QRS widening ( R in aVL , Cornell product ) Nonspecific T wave abnormality Abnormal ECG When compared with ECG of 23-Aug-2020 15:29, Appears unchanged Confirmed by Garnette Gunner [phone]) on 02/08/2022 7:32:21 PM  Radiology DG Chest 2 View  Result Date: 02/08/2022 CLINICAL DATA:  Chest pain. EXAM: CHEST - 2 VIEW COMPARISON:  Chest x-ray 08/22/2020 FINDINGS: The heart size and mediastinal contours are within normal limits. Both lungs are clear. The visualized  skeletal structures are unremarkable. IMPRESSION: No active cardiopulmonary disease. Electronically Signed   By: Ronney Asters M.D.   On: 02/08/2022 15:12    Procedures Procedures   Medications Ordered in ED Medications  pantoprazole (PROTONIX) EC tablet 40 mg (40 mg Oral Given 02/08/22 1939)  alum & mag hydroxide-simeth (MAALOX/MYLANTA) 200-200-20 MG/5ML suspension 15 mL (15 mLs Oral Given 02/08/22 1852)  ondansetron (ZOFRAN-ODT) disintegrating tablet 4 mg (4 mg Oral Given 02/08/22 1939)    ED Course/ Medical Decision Making/ A&P Clinical Course as of 02/09/22 0000  Thu Feb 08, 2022  1150 CT tech came and  told me that the patient's IV infiltrated wall and CT scan.  Patient was brought back to the room.  Stable in appearance.  I spoke with nursing and they will start a new IV [MR]  2040 I spoke with the patient about her questionable iodine allergy.  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  She did have a CT scan in March of last year and she reports there were no problems.  I reviewed the note and there did not seem to be any premedication or side effects from the contrast [MR]  2243 Total Bilirubin(!): 2.5 [MR]  2243 ALT(!): 102 [MR]  2243 AST(!): 91 [MR]    Clinical Course User Index [MR] Neila Teem, Cecilio Asper, PA-C                           Medical Decision Making Amount and/or Complexity of Data Reviewed Labs: ordered. Decision-making details documented in ED Course. Radiology: ordered.  Risk Prescription drug management.   This patient presents to the ED for concern of epigastric pain.  Differential includes but is not limited to GERD, esophagitis, pancreatitis, PUD, gastritis, ACS   This is not an exhaustive differential.    Past Medical History / Co-morbidities / Social History: GERD and esophagitis   Additional history: Reviewed patient's note from gastroenterology in June.   Physical Exam: Pertinent physical exam findings  include tender epigastrium.  Occasional bradycardia to  Lab Tests: I ordered, and personally interpreted labs.  The pertinent results include:  Negative Trops BNP 154, presentation inconsistent with heart failure exacerbation ALT 102, T. bili 2.5, AST 91   Imaging Studies: I ordered CT abdomen pelvis which is still pending at this time viewed and interpreted patient's chest x-ray which was negative   Cardiac Monitoring:  The patient was maintained on a cardiac monitor.  My attending physician Dr. Truett Mainland viewed and interpreted the cardiac monitored which showed an underlying rhythm of: Sinus brady   Medications: I ordered medication including Maalox, pantoprazole, Zofran. Reevaluation of the patient after these medicines showed that the patient improved. I have reviewed the patients home medicines and have made adjustments as needed.  Disposition: 81 year old female who presented today with epigastric pain.  Has a history of GERD and esophagitis but also had some chest fluttering.  Lab work was revealing of transaminitis.  Imaging has been ordered but pending at time of shift change.  Signed out to [employer].patient is to be dispositioned according to her imaging.  She will speak to her cardiologist about her bradycardia however because she is asymptomatic of this, they were not consulted today.  I suspect patient will be discharged home if there are no acute findings on her scan.  She will be able to talk to her GI doc about her transaminitis.  If there are any other abnormalities, she will likely require admission.  Patient is agreeable to the plan.  Signed out to oncoming team.  I discussed this case with my attending physician Dr. Truett Mainland who cosigned this note including patient's presenting symptoms, physical exam, and planned diagnostics and interventions. Attending physician stated agreement with plan or made changes to plan which were implemented.     Final Clinical Impression(s)  / ED Diagnoses Final diagnoses:  Epigastric abdominal pain    Rx / DC Orders Signed out to [employer], PA-C. See her note.   Rhae Hammock, PA-C 02/09/22  7225    Cristie Hem, MD 02/09/22 1546

## 2022-02-09 ENCOUNTER — Inpatient Hospital Stay (HOSPITAL_COMMUNITY): Payer: Medicare Other | Admitting: Certified Registered"

## 2022-02-09 ENCOUNTER — Emergency Department (HOSPITAL_COMMUNITY): Payer: Medicare Other

## 2022-02-09 ENCOUNTER — Inpatient Hospital Stay (HOSPITAL_COMMUNITY): Payer: Medicare Other

## 2022-02-09 ENCOUNTER — Encounter (HOSPITAL_COMMUNITY)
Admission: EM | Disposition: A | Discharge status: Home / Self Care | Payer: Self-pay | Source: Home / Self Care | Attending: Internal Medicine

## 2022-02-09 ENCOUNTER — Encounter (HOSPITAL_COMMUNITY): Payer: Self-pay | Admitting: Internal Medicine

## 2022-02-09 DIAGNOSIS — I10 Essential (primary) hypertension: Secondary | ICD-10-CM | POA: Diagnosis not present

## 2022-02-09 DIAGNOSIS — R1013 Epigastric pain: Secondary | ICD-10-CM | POA: Diagnosis present

## 2022-02-09 DIAGNOSIS — I509 Heart failure, unspecified: Secondary | ICD-10-CM

## 2022-02-09 DIAGNOSIS — I428 Other cardiomyopathies: Secondary | ICD-10-CM | POA: Diagnosis present

## 2022-02-09 DIAGNOSIS — E119 Type 2 diabetes mellitus without complications: Secondary | ICD-10-CM | POA: Diagnosis present

## 2022-02-09 DIAGNOSIS — I110 Hypertensive heart disease with heart failure: Secondary | ICD-10-CM | POA: Diagnosis present

## 2022-02-09 DIAGNOSIS — K831 Obstruction of bile duct: Secondary | ICD-10-CM | POA: Diagnosis not present

## 2022-02-09 DIAGNOSIS — Z808 Family history of malignant neoplasm of other organs or systems: Secondary | ICD-10-CM | POA: Diagnosis not present

## 2022-02-09 DIAGNOSIS — Z886 Allergy status to analgesic agent status: Secondary | ICD-10-CM | POA: Diagnosis not present

## 2022-02-09 DIAGNOSIS — I25119 Atherosclerotic heart disease of native coronary artery with unspecified angina pectoris: Secondary | ICD-10-CM | POA: Diagnosis not present

## 2022-02-09 DIAGNOSIS — Z9049 Acquired absence of other specified parts of digestive tract: Secondary | ICD-10-CM | POA: Diagnosis not present

## 2022-02-09 DIAGNOSIS — Z800 Family history of malignant neoplasm of digestive organs: Secondary | ICD-10-CM | POA: Diagnosis not present

## 2022-02-09 DIAGNOSIS — Z823 Family history of stroke: Secondary | ICD-10-CM | POA: Diagnosis not present

## 2022-02-09 DIAGNOSIS — Z8249 Family history of ischemic heart disease and other diseases of the circulatory system: Secondary | ICD-10-CM | POA: Diagnosis not present

## 2022-02-09 DIAGNOSIS — K8051 Calculus of bile duct without cholangitis or cholecystitis with obstruction: Secondary | ICD-10-CM | POA: Diagnosis not present

## 2022-02-09 DIAGNOSIS — Z7982 Long term (current) use of aspirin: Secondary | ICD-10-CM | POA: Diagnosis not present

## 2022-02-09 DIAGNOSIS — Z91041 Radiographic dye allergy status: Secondary | ICD-10-CM | POA: Diagnosis not present

## 2022-02-09 DIAGNOSIS — Z7983 Long term (current) use of bisphosphonates: Secondary | ICD-10-CM | POA: Diagnosis not present

## 2022-02-09 DIAGNOSIS — Z96653 Presence of artificial knee joint, bilateral: Secondary | ICD-10-CM | POA: Diagnosis present

## 2022-02-09 DIAGNOSIS — K8050 Calculus of bile duct without cholangitis or cholecystitis without obstruction: Secondary | ICD-10-CM

## 2022-02-09 DIAGNOSIS — Z880 Allergy status to penicillin: Secondary | ICD-10-CM | POA: Diagnosis not present

## 2022-02-09 DIAGNOSIS — Z87891 Personal history of nicotine dependence: Secondary | ICD-10-CM

## 2022-02-09 DIAGNOSIS — R110 Nausea: Secondary | ICD-10-CM | POA: Diagnosis not present

## 2022-02-09 DIAGNOSIS — Z7984 Long term (current) use of oral hypoglycemic drugs: Secondary | ICD-10-CM | POA: Diagnosis not present

## 2022-02-09 DIAGNOSIS — Z90710 Acquired absence of both cervix and uterus: Secondary | ICD-10-CM | POA: Diagnosis not present

## 2022-02-09 DIAGNOSIS — Z91013 Allergy to seafood: Secondary | ICD-10-CM | POA: Diagnosis not present

## 2022-02-09 DIAGNOSIS — Z6841 Body Mass Index (BMI) 40.0 and over, adult: Secondary | ICD-10-CM | POA: Diagnosis not present

## 2022-02-09 DIAGNOSIS — Z833 Family history of diabetes mellitus: Secondary | ICD-10-CM | POA: Diagnosis not present

## 2022-02-09 DIAGNOSIS — R748 Abnormal levels of other serum enzymes: Secondary | ICD-10-CM | POA: Diagnosis not present

## 2022-02-09 DIAGNOSIS — Z79899 Other long term (current) drug therapy: Secondary | ICD-10-CM | POA: Diagnosis not present

## 2022-02-09 HISTORY — PX: BILIARY DILATION: SHX6850

## 2022-02-09 HISTORY — PX: SPHINCTEROTOMY: SHX5279

## 2022-02-09 HISTORY — PX: REMOVAL OF STONES: SHX5545

## 2022-02-09 HISTORY — PX: ERCP: SHX5425

## 2022-02-09 LAB — PROTIME-INR
INR: 1.1 (ref 0.8–1.2)
Prothrombin Time: 13.6 seconds (ref 11.4–15.2)

## 2022-02-09 LAB — HEMOGLOBIN A1C
Hgb A1c MFr Bld: 5.7 % — ABNORMAL HIGH (ref 4.8–5.6)
Mean Plasma Glucose: 116.89 mg/dL

## 2022-02-09 LAB — GLUCOSE, CAPILLARY
Glucose-Capillary: 135 mg/dL — ABNORMAL HIGH (ref 70–99)
Glucose-Capillary: 154 mg/dL — ABNORMAL HIGH (ref 70–99)

## 2022-02-09 LAB — HEPATITIS PANEL, ACUTE
HCV Ab: NONREACTIVE
Hep A IgM: NONREACTIVE
Hep B C IgM: NONREACTIVE
Hepatitis B Surface Ag: NONREACTIVE

## 2022-02-09 LAB — CBG MONITORING, ED: Glucose-Capillary: 82 mg/dL (ref 70–99)

## 2022-02-09 SURGERY — ERCP, WITH INTERVENTION IF INDICATED
Anesthesia: General

## 2022-02-09 MED ORDER — SUGAMMADEX SODIUM 200 MG/2ML IV SOLN
INTRAVENOUS | Status: DC | PRN
  Administered 2022-02-09: 200 mg via INTRAVENOUS

## 2022-02-09 MED ORDER — GLUCAGON HCL RDNA (DIAGNOSTIC) 1 MG IJ SOLR
INTRAMUSCULAR | Status: AC
  Filled 2022-02-09: qty 1

## 2022-02-09 MED ORDER — INDOMETHACIN 50 MG RE SUPP
RECTAL | Status: DC | PRN
  Administered 2022-02-09: 100 mg via RECTAL

## 2022-02-09 MED ORDER — INDOMETHACIN 50 MG RE SUPP
RECTAL | Status: AC
  Filled 2022-02-09: qty 2

## 2022-02-09 MED ORDER — LORAZEPAM 2 MG/ML IJ SOLN
1.0000 mg | Freq: Once | INTRAMUSCULAR | Status: AC
  Administered 2022-02-09: 1 mg via INTRAVENOUS
  Filled 2022-02-09: qty 1

## 2022-02-09 MED ORDER — DEXAMETHASONE SODIUM PHOSPHATE 10 MG/ML IJ SOLN
INTRAMUSCULAR | Status: DC | PRN
  Administered 2022-02-09: 4 mg via INTRAVENOUS

## 2022-02-09 MED ORDER — FENTANYL CITRATE (PF) 100 MCG/2ML IJ SOLN
25.0000 ug | INTRAMUSCULAR | Status: DC | PRN
  Administered 2022-02-09: 100 ug via INTRAVENOUS

## 2022-02-09 MED ORDER — SUCCINYLCHOLINE CHLORIDE 200 MG/10ML IV SOSY
PREFILLED_SYRINGE | INTRAVENOUS | Status: DC | PRN
  Administered 2022-02-09: 160 mg via INTRAVENOUS

## 2022-02-09 MED ORDER — LACTATED RINGERS IV SOLN: INTRAVENOUS | Status: AC

## 2022-02-09 MED ORDER — SUCRALFATE 1 GM/10ML PO SUSP: 1.0000 g | Freq: Three times a day (TID) | ORAL | 0 refills | Status: DC

## 2022-02-09 MED ORDER — CIPROFLOXACIN IN D5W 400 MG/200ML IV SOLN
INTRAVENOUS | Status: AC
  Filled 2022-02-09: qty 200

## 2022-02-09 MED ORDER — ROCURONIUM BROMIDE 10 MG/ML (PF) SYRINGE
PREFILLED_SYRINGE | INTRAVENOUS | Status: DC | PRN
  Administered 2022-02-09: 50 mg via INTRAVENOUS

## 2022-02-09 MED ORDER — PROPOFOL 10 MG/ML IV BOLUS
INTRAVENOUS | Status: DC | PRN
  Administered 2022-02-09: 100 mg via INTRAVENOUS

## 2022-02-09 MED ORDER — HYDRALAZINE HCL 20 MG/ML IJ SOLN: 5.0000 mg | INTRAMUSCULAR | Status: DC | PRN

## 2022-02-09 MED ORDER — PHENYLEPHRINE 80 MCG/ML (10ML) SYRINGE FOR IV PUSH (FOR BLOOD PRESSURE SUPPORT)
PREFILLED_SYRINGE | INTRAVENOUS | Status: DC | PRN
  Administered 2022-02-09: 160 ug via INTRAVENOUS
  Administered 2022-02-09: 80 ug via INTRAVENOUS

## 2022-02-09 MED ORDER — ACETAMINOPHEN 160 MG/5ML PO SOLN
1000.0000 mg | Freq: Once | ORAL | Status: DC | PRN
  Filled 2022-02-09: qty 40

## 2022-02-09 MED ORDER — IOHEXOL 300 MG/ML  SOLN
100.0000 mL | Freq: Once | INTRAMUSCULAR | Status: AC | PRN
  Administered 2022-02-09: 100 mL via INTRAVENOUS

## 2022-02-09 MED ORDER — DICLOFENAC SUPPOSITORY 100 MG: 100.0000 mg | Freq: Once | RECTAL | Status: DC

## 2022-02-09 MED ORDER — ACETAMINOPHEN 500 MG PO TABS: 1000.0000 mg | ORAL_TABLET | Freq: Once | ORAL | Status: DC | PRN

## 2022-02-09 MED ORDER — LIDOCAINE 2% (20 MG/ML) 5 ML SYRINGE
INTRAMUSCULAR | Status: DC | PRN
  Administered 2022-02-09: 60 mg via INTRAVENOUS

## 2022-02-09 MED ORDER — SODIUM CHLORIDE 0.9 % IV SOLN
INTRAVENOUS | Status: DC | PRN
  Administered 2022-02-09: 25 mL

## 2022-02-09 MED ORDER — GLUCAGON HCL RDNA (DIAGNOSTIC) 1 MG IJ SOLR
INTRAMUSCULAR | Status: DC | PRN
  Administered 2022-02-09 (×2): .2 mg via INTRAVENOUS

## 2022-02-09 MED ORDER — CIPROFLOXACIN IN D5W 400 MG/200ML IV SOLN
400.0000 mg | Freq: Once | INTRAVENOUS | Status: AC
  Administered 2022-02-09: 400 mg via INTRAVENOUS

## 2022-02-09 MED ORDER — MORPHINE SULFATE (PF) 2 MG/ML IV SOLN
0.5000 mg | INTRAVENOUS | Status: DC | PRN
  Administered 2022-02-10 (×2): 0.5 mg via INTRAVENOUS
  Filled 2022-02-09 (×2): qty 1

## 2022-02-09 MED ORDER — INSULIN ASPART 100 UNIT/ML IJ SOLN
0.0000 [IU] | INTRAMUSCULAR | Status: DC
  Administered 2022-02-09: 1 [IU] via SUBCUTANEOUS

## 2022-02-09 NOTE — ED Provider Notes (Incomplete)
[facility] EMER[facility]ENC[facility] DEPARTMENT Provider Note   CSN: [id number] Arrival date & time: 02/08/22  1356     History  Chief Complaint  Patient presents with  . Chest Pain  . Abdominal Pain    Julie Graves is a 81 y.o. female with a past medical history of hypertension, congestive heart failure, type 2 diabetes, obesity and hyperlipidemia presenting today due to the complaint of epigastric pain.  She reports its been going on since Sunday.  On Monday it was on and off but Tuesday it was constant and very painful.  Wednesday morning she forced herself to vomit and felt slightly better.  She continues to have epigastric pain but this morning she also felt a fluttering in her heart.  She follows with GI for GERD and 2 months ago told that her GERD was "very bad and looked very bad in her esophagus."  Takes 40 mg of twice daily pantoprazole but admittedly misses the night dose on occasion.  No chest pain or shortness of breath.   Chest Pain Associated symptoms: abdominal pain   Abdominal Pain Associated symptoms: chest pain        Home Medications Prior to Admission medications   Medication Sig Start Date End Date Taking? Authorizing Provider  acetaminophen (TYLENOL) 325 MG tablet Take 2 tablets (650 mg total) by mouth every 6 (six) hours as needed. 08/26/20   Jill Alexanders, PA-C  alendronate (FOSAMAX) 70 MG tablet Take 70 mg by mouth every Monday. Take with a full glass of water on an empty stomach. Take on mondays    [provider]  aspirin EC 81 MG tablet Take 81 mg by mouth daily. Swallow whole.    [provider]  Calcium Carb-Cholecalciferol (CALCIUM 600/VITAMIN D3 PO) Take 1 tablet by mouth daily.    [provider]  carvedilol (COREG) 6.25 MG tablet Take 6.25 mg by mouth 2 (two) times daily.    [provider]  Cholecalciferol (VITAMIN D) 2000 units CAPS Take 2,000 Units by mouth daily.    [provider]   diazepam (VALIUM) 2 MG tablet Take 1 mg by mouth daily as needed for anxiety. 11/25/19   [provider]  furosemide (LASIX) 20 MG tablet Take 20 mg by mouth as needed.    [provider]  HYDROcodone-acetaminophen (NORCO/VICODIN) 5-325 MG tablet Take 1 tablet by mouth every 6 (six) hours as needed for moderate pain.    [provider]  losartan (COZAAR) 100 MG tablet TAKE 1 TABLET(100 MG) BY MOUTH DAILY 12/06/21   Adrian Prows, MD  metFORMIN (GLUCOPHAGE-XR) 500 MG 24 hr tablet Take 500 mg by mouth daily. 07/08/19   [provider]  pantoprazole (PROTONIX) 40 MG tablet Take 1 tablet (40 mg total) by mouth 2 (two) times daily before a meal. 10/31/21   Ladene Artist, MD  potassium chloride SA (KLOR-CON) 20 MEQ tablet Take 20 mEq by mouth daily. 10/28/20   [provider]  simvastatin (ZOCOR) 40 MG tablet Take 40 mg by mouth at bedtime.    [provider]  spironolactone (ALDACTONE) 25 MG tablet TAKE 1 TABLET(25 MG) BY MOUTH DAILY 11/03/21   Adrian Prows, MD      Allergies    Iodine, Shellfish allergy, Topiramate, Metamucil [psyllium], Lisinopril, Penicillins, and Tramadol    Review of Systems   Review of Systems  Cardiovascular:  Positive for chest pain.  Gastrointestinal:  Positive for abdominal pain.    Physical Exam  Updated Vital Signs BP 133/80 (BP Location: Right Arm)   Pulse (!) 54   Temp (!) 97.5 F (36.4 C) (Oral)   Resp 15   Ht 5' (1.524 m)   Wt 93.4 kg   SpO2 100%   BMI 40.23 kg/m  Physical Exam Vitals and nursing note reviewed.  Constitutional:      General: She is not in acute distress.    Appearance: Normal appearance. She is not ill-appearing.  HENT:     Head: Normocephalic and atraumatic.  Eyes:     General: No scleral icterus.    Conjunctiva/sclera: Conjunctivae normal.  Cardiovascular:     Rate and Rhythm: Regular rhythm. Bradycardia present.     Heart sounds: Normal heart sounds.  Pulmonary:     Effort:  Pulmonary effort is normal. No respiratory distress.  Abdominal:     Palpations: Abdomen is soft.     Tenderness: There is abdominal tenderness (epigastric).  Skin:    General: Skin is warm and dry.     Findings: No rash.  Neurological:     Mental Status: She is alert.  Psychiatric:        Mood and Affect: Mood normal.     ED Results / Procedures / Treatments   Labs (all labs ordered are listed, but only abnormal results are displayed) Labs Reviewed  BASIC METABOLIC PANEL - Abnormal; Notable for the following components:      Result Value   Glucose, Bld 102 (*)    All other components within normal limits  BRAIN NATRIURETIC PEPTIDE - Abnormal; Notable for the following components:   B Natriuretic Peptide 154.4 (*)    All other components within normal limits  CBC  LIPASE, BLOOD  HEPATIC FUNCTION PANEL  TROPONIN I (HIGH SENSITIVITY)  TROPONIN I (HIGH SENSITIVITY)    EKG EKG Interpretation  Date/Time:  Thursday February 08 2022 14:13:32 EDT Ventricular Rate:  60 PR Interval:  134 QRS Duration: 122 QT Interval:  432 QTC Calculation: 432 R Axis:   -26 Text Interpretation: Sinus rhythm with Premature supraventricular complexes Left ventricular hypertrophy with QRS widening ( R in aVL , Cornell product ) Nonspecific T wave abnormality Abnormal ECG When compared with ECG of 23-Aug-2020 15:29, Appears unchanged Confirmed by Garnette Gunner [phone]) on 02/08/2022 7:32:21 PM  Radiology DG Chest 2 View  Result Date: 02/08/2022 CLINICAL DATA:  Chest pain. EXAM: CHEST - 2 VIEW COMPARISON:  Chest x-ray 08/22/2020 FINDINGS: The heart size and mediastinal contours are within normal limits. Both lungs are clear. The visualized skeletal structures are unremarkable. IMPRESSION: No active cardiopulmonary disease. Electronically Signed   By: Ronney Asters M.D.   On: 02/08/2022 15:12    Procedures Procedures   Medications Ordered in ED Medications  pantoprazole (PROTONIX) EC tablet 40  mg (40 mg Oral Given 02/08/22 1939)  alum & mag hydroxide-simeth (MAALOX/MYLANTA) 200-200-20 MG/5ML suspension 15 mL (15 mLs Oral Given 02/08/22 1852)  ondansetron (ZOFRAN-ODT) disintegrating tablet 4 mg (4 mg Oral Given 02/08/22 1939)    ED Course/ Medical Decision Making/ A&P Clinical Course as of 02/09/22 0000  Thu Feb 08, 2022  1150 CT tech came and told me that the patient's IV infiltrated wall and CT scan.  Patient was brought back to the room.  Stable in appearance.  I spoke with nursing and they will start a new IV [MR]  2040 I spoke with the patient about her questionable iodine allergy.  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  She did have a CT scan in March of last year and she reports there were no problems.  I reviewed the note and there did not seem to be any premedication or side effects from the contrast [MR]  2243 Total Bilirubin(!): 2.5 [MR]  2243 ALT(!): 102 [MR]  2243 AST(!): 91 [MR]    Clinical Course User Index [MR] Tyshia Fenter, Cecilio Asper, PA-C                           Medical Decision Making Amount and/or Complexity of Data Reviewed Labs: ordered. Decision-making details documented in ED Course. Radiology: ordered.  Risk Prescription drug management.   This patient presents to the ED for concern of epigastric pain.  Differential includes but is not limited to GERD, esophagitis, pancreatitis, PUD, gastritis, ACS   This is not an exhaustive differential.    Past Medical History / Co-morbidities / Social History: GERD and esophagitis   Additional history: Reviewed patient's note from gastroenterology in June.   Physical Exam: Pertinent physical exam findings include tender epigastrium.  Occasional bradycardia to  Lab Tests: I ordered, and personally interpreted labs.  The pertinent results include:  Negative Trops BNP 154, presentation inconsistent with heart failure exacerbation ALT 102, T. bili 2.5, AST 91    Imaging Studies: I ordered CT abdomen pelvis which is still pending at this time viewed and interpreted patient's chest x-ray which was negative   Cardiac Monitoring:  The patient was maintained on a cardiac monitor.  My attending physician Dr. Truett Mainland viewed and interpreted the cardiac monitored which showed an underlying rhythm of: Sinus brady   Medications: I ordered medication including Maalox, pantoprazole, Zofran. Reevaluation of the patient after these medicines showed that the patient improved. I have reviewed the patients home medicines and have made adjustments as needed.   Consultations Obtained: I discussed lab and imaging findings with ***with  ***.  They recommend  ***  Disposition: After consideration of the diagnostic results and the patients response to treatment, I feel that *** .    I discussed this case with my attending physician Dr. Truett Mainland who cosigned this note including patient's presenting symptoms, physical exam, and planned diagnostics and interventions. Attending physician stated agreement with plan or made changes to plan which were implemented.     {Document critical care time when appropriate:1} {Document review of labs and clinical decision tools ie heart score, Chads2Vasc2 etc:1}  {Document your independent review of radiology images, and any outside records:1} {Document your discussion with family members, caretakers, and with consultants:1} {Document social determinants of health affecting pt's care:1} {Document your decision making why or why not admission, treatments were needed:1} Final Clinical Impression(s) / ED Diagnoses Final diagnoses:  None    Rx / DC Orders ED Discharge Orders     None

## 2022-02-09 NOTE — Consult Note (Signed)
Consultation  Referring Provider: [facility] Primary Care Physician:  Harrison Mons, PA Primary Gastroenterologist:  Dr.Stark  Reason for Consultation: Due to abdominal pain/chest pain, elevated LFTs/probable CBD stone  HPI: Julie Graves is a 81 y.o. female known to Dr. Fuller Plan with history of GERD and colon polyps. Patient presented to the emergency room last night with complaints of chest and epigastric pain which she said started this past Sunday and had initially been intermittent but gradually progressed throughout the week and became constant and fairly intense.  She had nausea and vomiting on Tuesday and Wednesday and has been persistently nauseated since.  No diarrhea, no hematemesis, no chills or fever. Work-up in the emergency room with upper abdominal ultrasound shows her to be status postcholecystectomy with CBD of 11 mm CT of the abdomen pelvis today shows a rounded filling defect in the distal common bile duct consistent with CBD stone versus sludge.  She is status post cholecystectomy March 2022-she had acute cholecystitis to have a gangrenous gallbladder, no IOC was done secondary to concern for avulsion/significant injury.  WBC 5.8/hemoglobin 12.5/hematocrit 39 Be met unremarkable T. bili 2.5/direct bili 1.2/alk phos 114/AST 91/ALT 102 Lipase 16 Troponins negative, BNP 154  Medical issues include congestive heart failure, coronary artery disease, adult onset diabetes mellitus and hypertension.  She is not on antiplatelet or anticoagulation.  EGD May 2023 with grade B esophagitis and a medium sized hiatal hernia. Colonoscopy 2016   Past Medical History:  Diagnosis Date   Anginal pain ([place])    "small pain?nerves in chest"started 10/05/13    Anginal pain ([place])    cleared by cardiology 3/15   Anxiety    rx given recent not taken yet   Aortic insufficiency    mild on 9/11 cath   CAD (coronary artery disease)    nonobstructive let heart cath 3/11: 40%  ostial D1, 30% mid CFX   CHF (congestive heart failure) ([place])    Colon polyps 02/01/2009    MULTIPLE FRAGMENTS OF TUBULAR ADENOMAS   Diabetes mellitus    Frozen shoulder    GERD (gastroesophageal reflux disease)    occ   History of blood transfusion    pregnancy   HLD (hyperlipidemia)    HTN (hypertension)    ACEI cough   Hx of cardiovascular stress test    Lexiscan Myoview (10/2013):  Fixed ant defect most c/w breast attenuation, cannot exclude apical infarct; no ischemia, EF 46%; Low Risk   Nonischemic cardiomyopathy ([place])    Ech 3/11 difficult study, moderate aortic insuficiency noted. Left evntriculogram 3/11   Obese    Osteoarthritis    Palpitation    PACs noted on telemetry while in the hospital   Pneumonia    hx   Stress incontinence     Past Surgical History:  Procedure Laterality Date   ABDOMINAL HYSTERECTOMY     APPENDECTOMY     BLADDER NECK SUSPENSION  85   CARDIAC CATHETERIZATION     CHOLECYSTECTOMY N/A 08/24/2020   Procedure: LAPAROSCOPIC CHOLECYSTECTOMY;  Surgeon: Erroll Luna, MD;  Location: [facility];  Service: General;  Laterality: N/A;   EYE SURGERY     HAND SURGERY     right "had knots cut out"   KNEE ARTHROPLASTY Right 12/09/2013   Procedure: COMPUTER ASSISTED Right TOTAL KNEE ARTHROPLASTY;  Surgeon: Marybelle Killings, MD;  Location: [facility];  Service: Orthopedics;  Laterality: Right;   LEFT HEART CATH AND CORONARY ANGIOGRAPHY N/A 08/15/2017   Procedure: LEFT HEART CATH  AND CORONARY ANGIOGRAPHY;  Surgeon: Jettie Booze, MD;  Location: [facility];  Service: Cardiovascular;  Laterality: N/A;   TOTAL KNEE ARTHROPLASTY Left 05/13/2020   Procedure: LEFT TOTAL KNEE ARTHROPLASTY;  Surgeon: Marybelle Killings, MD;  Location: [facility];  Service: Orthopedics;  Laterality: Left;   TUBAL LIGATION      Prior to Admission medications   Medication Sig Start Date End Date Taking? Authorizing Provider  acetaminophen (TYLENOL) 325 MG tablet Take 2 tablets (650 mg total)  by mouth every 6 (six) hours as needed. 08/26/20  Yes Jill Alexanders, PA-C  alendronate (FOSAMAX) 70 MG tablet Take 70 mg by mouth every Monday. Take with a full glass of water on an empty stomach. Take on mondays   Yes [provider]  aspirin EC 81 MG tablet Take 81 mg by mouth daily. Swallow whole.   Yes [provider]  Calcium Carb-Cholecalciferol (CALCIUM 600/VITAMIN D3 PO) Take 1 tablet by mouth daily.   Yes [provider]  carvedilol (COREG) 6.25 MG tablet Take 6.25 mg by mouth 2 (two) times daily.   Yes [provider]  diazepam (VALIUM) 2 MG tablet Take 1 mg by mouth daily as needed for anxiety. 11/25/19  Yes [provider]  HYDROcodone-acetaminophen (NORCO/VICODIN) 5-325 MG tablet Take 1 tablet by mouth every 6 (six) hours as needed for moderate pain.   Yes [provider]  losartan (COZAAR) 100 MG tablet TAKE 1 TABLET(100 MG) BY MOUTH DAILY Patient taking differently: Take 100 mg by mouth daily. 12/06/21  Yes Adrian Prows, MD  metFORMIN (GLUCOPHAGE-XR) 500 MG 24 hr tablet Take 500 mg by mouth daily. 07/08/19  Yes [provider]  pantoprazole (PROTONIX) 40 MG tablet Take 1 tablet (40 mg total) by mouth 2 (two) times daily before a meal. 10/31/21  Yes Ladene Artist, MD  potassium chloride SA (KLOR-CON) 20 MEQ tablet Take 20 mEq by mouth daily. 10/28/20  Yes [provider]  simvastatin (ZOCOR) 40 MG tablet Take 40 mg by mouth at bedtime.   Yes [provider]  spironolactone (ALDACTONE) 25 MG tablet TAKE 1 TABLET(25 MG) BY MOUTH DAILY Patient taking differently: Take 25 mg by mouth daily. 11/03/21  Yes Adrian Prows, MD  sucralfate (CARAFATE) 1 GM/10ML suspension Take 10 mLs (1 g total) by mouth 4 (four) times daily -  with meals and at bedtime. 02/09/22  Yes Redwine, Madison A, PA-C    Current Facility-Administered Medications  Medication Dose Route Frequency Provider Last Rate Last Admin   hydrALAZINE  (APRESOLINE) injection 5 mg  5 mg Intravenous Q4H PRN Rise Patience, MD       insulin aspart (novoLOG) injection 0-6 Units  0-6 Units Subcutaneous Q4H Rise Patience, MD       lactated ringers infusion   Intravenous Continuous Rise Patience, MD       morphine (PF) 2 MG/ML injection 0.5 mg  0.5 mg Intravenous Q3H PRN Rise Patience, MD       Current Outpatient Medications  Medication Sig Dispense Refill   acetaminophen (TYLENOL) 325 MG tablet Take 2 tablets (650 mg total) by mouth every 6 (six) hours as needed.     alendronate (FOSAMAX) 70 MG tablet Take 70 mg by mouth every Monday. Take with a full glass of water on an empty stomach. Take on mondays     aspirin EC 81 MG tablet Take 81 mg by mouth daily. Swallow whole.  Calcium Carb-Cholecalciferol (CALCIUM 600/VITAMIN D3 PO) Take 1 tablet by mouth daily.     carvedilol (COREG) 6.25 MG tablet Take 6.25 mg by mouth 2 (two) times daily.     diazepam (VALIUM) 2 MG tablet Take 1 mg by mouth daily as needed for anxiety.     HYDROcodone-acetaminophen (NORCO/VICODIN) 5-325 MG tablet Take 1 tablet by mouth every 6 (six) hours as needed for moderate pain.     losartan (COZAAR) 100 MG tablet TAKE 1 TABLET(100 MG) BY MOUTH DAILY (Patient taking differently: Take 100 mg by mouth daily.) 90 tablet 3   metFORMIN (GLUCOPHAGE-XR) 500 MG 24 hr tablet Take 500 mg by mouth daily.     pantoprazole (PROTONIX) 40 MG tablet Take 1 tablet (40 mg total) by mouth 2 (two) times daily before a meal. 90 tablet 7   potassium chloride SA (KLOR-CON) 20 MEQ tablet Take 20 mEq by mouth daily.     simvastatin (ZOCOR) 40 MG tablet Take 40 mg by mouth at bedtime.     spironolactone (ALDACTONE) 25 MG tablet TAKE 1 TABLET(25 MG) BY MOUTH DAILY (Patient taking differently: Take 25 mg by mouth daily.) 90 tablet 2   sucralfate (CARAFATE) 1 GM/10ML suspension Take 10 mLs (1 g total) by mouth 4 (four) times daily -  with meals and at bedtime. 420 mL 0     Allergies as of 02/08/2022 - Review Complete 02/08/2022  Allergen Reaction Noted   Shellfish allergy Swelling and Other (See Comments) 11/27/2013   Topiramate  06/24/2019   Metamucil [psyllium]  06/04/2019   Lisinopril Other (See Comments) 06/23/2013   Penicillins Hives, Rash, and Other (See Comments) 01/19/2009   Tramadol Other (See Comments) 11/27/2013    Family History  Problem Relation Age of Onset   Stroke Mother 73   Heart attack Father    Heart attack Sister    Heart disease Sister    Diabetes Sister    Thyroid cancer Sister    Diabetes Brother    Colon cancer Brother    Breast cancer Neg Hx    Esophageal cancer Neg Hx    Pancreatic cancer Neg Hx    Stomach cancer Neg Hx     Social History   Socioeconomic History   Marital status: Married    Spouse name: Not on file   Number of children: 3   Years of education: Not on file   Highest education level: Not on file  Occupational History   Not on file  Tobacco Use   Smoking status: Former    Packs/day: 1.00    Years: 15.00    Total pack years: 15.00    Types: Cigarettes    Quit date: 10/10/1983    Years since quitting: 38.3   Smokeless tobacco: Never   Tobacco comments:    Quit 1970s  Vaping Use   Vaping Use: Never used  Substance and Sexual Activity   Alcohol use: No    Alcohol/week: 0.0 standard drinks of alcohol   Drug use: No   Sexual activity: Not on file  Other Topics Concern   Not on file  Social History Narrative   Not on file   Social Determinants of Health   Financial Resource Strain: Not on file  Food Insecurity: Not on file  Transportation Needs: Not on file  Physical Activity: Not on file  Stress: Not on file  Social Connections: Not on file  Intimate Partner Violence: Not on file    Review of Systems: Pertinent positive  and negative review of systems were noted in the above HPI section.  All other review of systems was otherwise negative.   Physical Exam: Vital signs in  last 24 hours: Temp:  [97.5 F (36.4 C)-98.3 F (36.8 C)] 98.3 F (36.8 C) (08/18 0500) Pulse Rate:  [46-64] 60 (08/18 0915) Resp:  [14-22] 20 (08/18 0915) BP: (106-142)/(39-101) 142/50 (08/18 0915) SpO2:  [98 %-100 %] 98 % (08/18 0915) Weight:  [93.4 kg] 93.4 kg (08/17 1835)   General:   Alert,  Well-developed, well-nourished, elderly African-American female, pleasant and cooperative in NAD Head:  Normocephalic and atraumatic. Eyes:  Sclera clear, no icterus.   Conjunctiva pink. Ears:  Normal auditory acuity. Nose:  No deformity, discharge,  or lesions. Mouth:  No deformity or lesions.   Neck:  Supple; no masses or thyromegaly. Lungs:  Clear throughout to auscultation.   No wheezes, crackles, or rhonchi. Heart:  Regular rate and rhythm; no murmurs, clicks, rubs,  or gallops. Abdomen:  Soft, obese, she is tender in the epigastrium, no rebound, no palpable mass or hepatosplenomegaly bowel sounds are present Rectal: Not done Msk:  Symmetrical without gross deformities. . Pulses:  Normal pulses noted. Extremities:  Without clubbing or edema. Neurologic:  Alert and  oriented x4;  grossly normal neurologically. Skin:  Intact without significant lesions or rashes.. Psych:  Alert and cooperative. Normal mood and affect.  Intake/Output from previous day: No intake/output data recorded. Intake/Output this shift: No intake/output data recorded.  Lab Results: Recent Labs    02/08/22 1421  WBC 5.8  HGB 12.5  HCT 39.1  PLT 246   BMET Recent Labs    02/08/22 1421  NA 136  K 4.2  CL 105  CO2 24  GLUCOSE 102*  BUN 16  CREATININE 0.73  CALCIUM 9.5   LFT Recent Labs    02/08/22 1933  PROT 6.4*  ALBUMIN 3.5  AST 91*  ALT 102*  ALKPHOS 114  BILITOT 2.5*  BILIDIR 1.2*  IBILI 1.3*   PT/INR No results for input(s): "LABPROT", "INR" in the last 72 hours. Hepatitis Panel No results for input(s): "HEPBSAG", "HCVAB", "HEPAIGM", "HEPBIGM" in the last 72  hours.    IMPRESSION:  #3 81 year old African-American female status post cholecystectomy March 2022 for acute cholecystitis/gangrenous gallbladder who presents with acute onset 5 days ago with epigastric pain and intermittent nausea.  Symptoms initially intermittent and then progressed and pain has become constant.  She has had intermittent nausea and vomiting, no fever or chills.  Scented to the emergency room last night and ultrasound and CT both consistent with a filling defect in the distal common bile duct with CBD of 11 mm. LFTs mildly elevated as above No leukocytosis and afebrile  Signs and symptoms are consistent with choledocholithiasis no evidence for cholangitis at this point  #2 chronic GERD #3.  History of coronary artery disease #4.  Congestive heart failure-EF 50 to 55%, mild aortic valve stenosis #5.  Adult onset diabetes mellitus #6.  Hypertension #7.  History of colon polyps.  Plan; keep NPO. Discussed with Dr. Lyndel Safe and patient has been scheduled for ERCP with stone extraction at 3 PM today.  Procedure was discussed in detail with the patient including indications risks and benefits, we reviewed images of the biliary tree etc.  She is agreeable to proceed Check INR  IV Cipro pre-procedure Repeat labs in a.m.  Further Recommendations pending findings at ERCP.       Julie Chirico  PA-C8/18/2023, 9:52 AM

## 2022-02-09 NOTE — ED Provider Notes (Signed)
Physical Exam  BP (!) 112/44   Pulse (!) 50   Temp (!) 97.5 F (36.4 C) (Oral)   Resp 14   Ht 5' (1.524 m)   Wt 93.4 kg   SpO2 100%   BMI 40.23 kg/m   Physical Exam  Procedures  Procedures  ED Course / MDM   Clinical Course as of 02/09/22 0337  Thu Feb 08, 2022  1150 CT tech came and told me that the patient's IV infiltrated wall and CT scan.  Patient was brought back to the room.  Stable in appearance.  I spoke with nursing and they will start a new IV [MR]  2040 I spoke with the patient about her questionable iodine allergy.  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  She did have a CT scan in March of last year and she reports there were no problems.  I reviewed the note and there did not seem to be any premedication or side effects from the contrast [MR]  2243 Total Bilirubin(!): 2.5 [MR]  2243 ALT(!): 102 [MR]  2243 AST(!): 91 [MR]  Fri Feb 09, 2022  0257 Consult to Dr. Havery Moros, [facility], who recommends medical admission. May need ERCP in the morning, will plan to trend hepatic function testing overnight. Patient to remain NPO. I appreciate his collaboration in the care of this patient.  [RS]  0330 Consult to Dr. Hal Hope, hospitalist, who is agreeable to admitting this patient to his service. I appreciate his collaboration in the care of this patient.  [RS]    Clinical Course User Index [MR] Redwine, Madison A, PA-C [RS] Teo Moede, Gypsy Balsam, PA-C   Medical Decision Making Amount and/or Complexity of Data Reviewed Labs: ordered. Decision-making details documented in ED Course.    Details: CBC with no leukocytosis or anemia, BMP unremarkable, hepatic function panel revealed transaminitis with AST/ALT 91/102.  Total bilirubin elevated to 2.5, direct bili 1.2, indirect 1.3.  Troponins are normal and lipase is normal. Radiology: ordered.    Details: Chest x-ray visualized with provider negative for acute cardiopulmonary  disease.  At time of shift change patient is awaiting CT abdomen pelvis with contrast, we will also add on right upper quadrant ultrasound due to IV contrast extravasation at time of CT.  Right upper quadrant ultrasound with status postcholecystectomy CBD dilated to 11 mm, question reservoir effect.  CT of the abdomen pelvis with again noted CBD dilatation concerning for choledocholithiasis versus sludge ball.  Risk Prescription drug management. Decision regarding hospitalization.   Care of this patient assumed from preceding ED provider Isabela, PA-C at time of shift change. Please see her associated note for further insight into the patient's ED course. In brief, the patient is an 81 year old female with history of hypertension CHF diabetes and GERD who presents with concern for epigastric pain ongoing for the last 5 days.  Intermittent initially  but now constant and painful prompting ED visit. Nausea, single episode of NBNB emesis. Compliant with pantoprazole.  Patient reportedly History of esophagitis for which she follows with [facility] gastroenterology.  Upper GI in May 2023 revealed LA grade B esophagitis without bleeding at the GE junction.  Hiatal hernia.  CT and RUQ [place] with CBD dilitation to 11 mm, concerning for choledocholithiasis. Consult pending to gastroenterology.   Consult to gastro and hospital medicine as above, please see ED course. Calisha and her family  voiced understanding of her medical evaluation and treatment plan.  Each of their questions answered to their expressed satisfaction. She is amenable to plan for admission at this time.   This chart was dictated using voice recognition software, Dragon. Despite the best efforts of this provider to proofread and correct errors, errors may still occur which can change documentation meaning.     Emeline Darling, PA-C 02/09/22 0254    Orpah Greek, MD 02/09/22 [phone]

## 2022-02-09 NOTE — Op Note (Signed)
[facility] Patient Name: Julie Graves Procedure Date : 02/09/2022 MRN: [id number] Attending MD: Jackquline Denmark , MD Date of Birth: Sep 20, 1940 CSN: [id number] Age: 81 Admit Type: Inpatient Procedure:                ERCP Indications:              Common bile duct stone(s) with abn LFTs Providers:                Jackquline Denmark, MD, Doristine Johns, RN, Gabriel Earing, RN, Benay Pillow, RN, Benetta Spar,                            Technician Referring MD:              Medicines:                General Anesthesia, IV cipro, IV glucogon 0.4 mg,                            Indocin supp. Complications:            No immediate complications. Estimated Blood Loss:     Estimated blood loss: none. Procedure:                Pre-Anesthesia Assessment:                           - Prior to the procedure, a History and Physical                            was performed, and patient medications and                            allergies were reviewed. The patient's tolerance of                            previous anesthesia was also reviewed. The risks                            and benefits of the procedure and the sedation                            options and risks were discussed with the patient.                            All questions were answered, and informed consent                            was obtained. Prior Anticoagulants: The patient has                            taken no previous anticoagulant or antiplatelet  agents. ASA Grade Assessment: II - A patient with                            mild systemic disease. After reviewing the risks                            and benefits, the patient was deemed in                            satisfactory condition to undergo the procedure.                           After obtaining informed consent, the scope was                            passed under direct vision.  Throughout the                            procedure, the patient's blood pressure, pulse, and                            oxygen saturations were monitored continuously. The                            [id number]-Q190V ([id number] Olympus duodenoscope was                            introduced through the mouth, and used to inject                            contrast into and used to inject contrast into the                            bile duct. The ERCP was accomplished without                            difficulty. The patient tolerated the procedure                            well. Scope In: Scope Out: Findings:      A scout film of the abdomen was obtained. Surgical clips, consistent       with a previous cholecystectomy, were seen in the area of the right       upper quadrant of the abdomen. The esophagus was successfully intubated       under direct vision. The scope was advanced to a normal major papilla in       the descending duodenum without detailed examination of the pharynx,       larynx and associated structures, and upper GI tract. The upper GI tract       was grossly normal. The bile duct was deeply cannulated with the       short-nosed traction sphincterotome using wire guided approach on 1st       attempt. Contrast was injected.      One 12 mm filling  defect consistent with CBD stone was visualized in the       distal 1/3rd of CBD. The entire biliary tree including CBD was dilated       with CBD measuring 14 mm. Long cystic duct stump did fill. The right and       left hepatic ducts were mildly dilated. There was evidence of previous       cholecystectomy.      8 mm biliary sphincterotomy using Erbe's endocut was performed at 12       o'clock position. Thereafter, sphincteroplasty was performed using       04-05-11 mm balloon (to a maximum balloon size of 11 mm) x 1 min each.       Minimal oozing which stopped on its own. The biliary tree was swept with       a 15 mm balloon  starting at the bifurcation. One stone and some sludge       was removed (see endoscopic photodocumentation). It was a faceted brown       stone. Bile was green. No pus. Postocclusion cholangiogram did not       reveal any residual filling defects.      Pancreatic duct was intentionally not cannulated. Indocin suppositories       were given. Impression:               - Choledocholithiasis s/p biliary sphincterotomy,                            sphincteroplasty and balloon extraction.                           - Previous cholecystectomy with a long cystic duct                            stump. Recommendation:           - Return patient to hospital ward for ongoing care.                           - Clear liquid diet. Advance to low-fat diet in AM                           - Watch for pancreatitis, bleeding, perforation,                            and cholangitis.                           - The findings and recommendations were discussed                            with the patient's daughter Maudie Mercury. Procedure Code(s):        --- Professional ---                           [phone], 10, Endoscopic retrograde                            cholangiopancreatography (ERCP); with  trans-endoscopic balloon dilation of                            biliary/pancreatic duct(s) or of ampulla                            (sphincteroplasty), including sphincterotomy, when                            performed, each duct                           43264, Endoscopic retrograde                            cholangiopancreatography (ERCP); with removal of                            calculi/debris from biliary/pancreatic duct(s)                           16109, Endoscopic catheterization of the biliary                            ductal system, radiological supervision and                            interpretation Diagnosis Code(s):        --- Professional ---                           K80.51,  Calculus of bile duct without cholangitis                            or cholecystitis with obstruction                           Z90.49, Acquired absence of other specified parts                            of digestive tract CPT copyright 2019 [employer]. All rights reserved. The codes documented in this report are preliminary and upon coder review may  be revised to meet current compliance requirements. Jackquline Denmark, MD 02/09/2022 5:30:40 PM This report has been signed electronically. Number of Addenda: 0

## 2022-02-09 NOTE — ED Notes (Signed)
WALKED PATIENT TO THE BATHROOM PATIENT DID GREAT

## 2022-02-09 NOTE — Transfer of Care (Signed)
Immediate Anesthesia Transfer of Care Note  Patient: [name]  Procedure(s) Performed: ENDOSCOPIC RETROGRADE CHOLANGIOPANCREATOGRAPHY (ERCP) REMOVAL OF STONES SPHINCTEROTOMY BILIARY DILATION  Patient Location: PACU  Anesthesia Type:General  Level of Consciousness: awake, alert  and oriented  Airway & Oxygen Therapy: Patient Spontanous Breathing  Post-op Assessment: Report given to RN and Post -op Vital signs reviewed and stable  Post vital signs: Reviewed and stable  Last Vitals:  Vitals Value Taken Time  BP 171/80 02/09/22 1730  Temp 36.3 C 02/09/22 1730  Pulse 74 02/09/22 1731  Resp 16 02/09/22 1731  SpO2 96 % 02/09/22 1731  Vitals shown include unvalidated device data.  Last Pain:  Vitals:   02/09/22 1425  TempSrc:   PainSc: 0-No pain         Complications: No notable events documented.

## 2022-02-09 NOTE — Anesthesia Preprocedure Evaluation (Addendum)
Anesthesia Evaluation  Patient identified by MRN, date of birth, ID band Patient awake    Reviewed: Allergy & Precautions, NPO status , Patient's Chart, lab work & pertinent test results  History of Anesthesia Complications Negative for: history of anesthetic complications  Airway Mallampati: III  TM Distance: >3 FB Neck ROM: Full    Dental  (+) Teeth Intact, Dental Advisory Given   Pulmonary neg COPD, former smoker,    breath sounds clear to auscultation       Cardiovascular hypertension, Pt. on home beta blockers and Pt. on medications + angina + CAD and +CHF  + Valvular Problems/Murmurs AI, MR and AS  Rhythm:Regular  IMPRESSIONS    1. Left ventricular ejection fraction, by estimation, is 50 to 55%. The  left ventricle has low normal function. The left ventricle has no regional  wall motion abnormalities. There is mild left ventricular hypertrophy.  Left ventricular diastolic  parameters were normal.  2. Right ventricular systolic function is normal. The right ventricular  size is normal.  3. Left atrial size was mildly dilated.  4. The mitral valve is normal in structure. Trivial mitral valve  regurgitation. No evidence of mitral stenosis.  5. The aortic valve was not well visualized. Aortic valve regurgitation  is mild. Mild aortic valve stenosis.  6. The inferior vena cava is normal in size with greater than 50%  respiratory variability, suggesting right atrial pressure of 3 mmHg.   Coronary angiogram 07/2017: mid circumflex 50%, D1 ostial 50% and mid 80%, very small. Mild noncritical CAD other vessels. EF 45%.   Neuro/Psych PSYCHIATRIC DISORDERS Anxiety Depression negative neurological ROS     GI/Hepatic Neg liver ROS, GERD  ,  Endo/Other  diabetes, Oral Hypoglycemic AgentsMorbid obesity (BMI 43)  Renal/GU negative Renal ROSLab Results      Component                Value               Date                       CREATININE               0.73                02/08/2022             negative genitourinary   Musculoskeletal  (+) Arthritis ,   Abdominal   Peds  Hematology Lab Results      Component                Value               Date                      WBC                      5.8                 02/08/2022                HGB                      12.5                02/08/2022                HCT  39.1                02/08/2022                MCV                      98.7                02/08/2022                PLT                      246                 02/08/2022              Anesthesia Other Findings   Reproductive/Obstetrics                            Anesthesia Physical  Anesthesia Plan  ASA: 3  Anesthesia Plan: General   Post-op Pain Management: Minimal or no pain anticipated   Induction: Intravenous  PONV Risk Score and Plan: 4 or greater and Treatment may vary due to age or medical condition, Ondansetron, Dexamethasone and Diphenhydramine  Airway Management Planned: Oral ETT  Additional Equipment:   Intra-op Plan:   Post-operative Plan:   Informed Consent:   Plan Discussed with:   Anesthesia Plan Comments: ( )        Anesthesia Quick Evaluation

## 2022-02-09 NOTE — H&P (Signed)
History and Physical    Julie Graves:[id number] DOB: Jul 20, 1940 DOA: 02/08/2022  PCP: Harrison Mons, PA  Patient coming from: Home.  Chief Complaint: Abdominal pain.  HPI: Julie Graves is a 81 y.o. female with history of CHF, hypertension, diabetes mellitus and prior cholecystectomy has been experiencing epigastric abdominal pain with nausea vomiting over the last 5 days which has been gradually worsening.  Last bowel movement was yesterday.  ED Course: In the ER ultrasound of the abdomen showed CBD dilatation and CT abdomen pelvis shows possible distal CBD stone.  Dr. Havery Moros gastroenterology has been consulted.  LFTs elevated.  Patient admitted for CBD obstruction likely from gallstones.  Review of Systems: As per HPI, rest all negative.   Past Medical History:  Diagnosis Date   Anginal pain ([place])    "small pain?nerves in chest"started 10/05/13    Anginal pain ([place])    cleared by cardiology 3/15   Anxiety    rx given recent not taken yet   Aortic insufficiency    mild on 9/11 cath   CAD (coronary artery disease)    nonobstructive let heart cath 3/11: 40% ostial D1, 30% mid CFX   CHF (congestive heart failure) ([place])    Colon polyps 02/01/2009    MULTIPLE FRAGMENTS OF TUBULAR ADENOMAS   Diabetes mellitus    Frozen shoulder    GERD (gastroesophageal reflux disease)    occ   History of blood transfusion    pregnancy   HLD (hyperlipidemia)    HTN (hypertension)    ACEI cough   Hx of cardiovascular stress test    Lexiscan Myoview (10/2013):  Fixed ant defect most c/w breast attenuation, cannot exclude apical infarct; no ischemia, EF 46%; Low Risk   Nonischemic cardiomyopathy ([place])    Ech 3/11 difficult study, moderate aortic insuficiency noted. Left evntriculogram 3/11   Obese    Osteoarthritis    Palpitation    PACs noted on telemetry while in the hospital   Pneumonia    hx   Stress incontinence     Past Surgical History:  Procedure Laterality  Date   ABDOMINAL HYSTERECTOMY     APPENDECTOMY     BLADDER NECK SUSPENSION  85   CARDIAC CATHETERIZATION     CHOLECYSTECTOMY N/A 08/24/2020   Procedure: LAPAROSCOPIC CHOLECYSTECTOMY;  Surgeon: Erroll Luna, MD;  Location: [facility];  Service: General;  Laterality: N/A;   EYE SURGERY     HAND SURGERY     right "had knots cut out"   KNEE ARTHROPLASTY Right 12/09/2013   Procedure: COMPUTER ASSISTED Right TOTAL KNEE ARTHROPLASTY;  Surgeon: Marybelle Killings, MD;  Location: [place] [facility];  Service: Orthopedics;  Laterality: Right;   LEFT HEART CATH AND CORONARY ANGIOGRAPHY N/A 08/15/2017   Procedure: LEFT HEART CATH AND CORONARY ANGIOGRAPHY;  Surgeon: Jettie Booze, MD;  Location: [facility];  Service: Cardiovascular;  Laterality: N/A;   TOTAL KNEE ARTHROPLASTY Left 05/13/2020   Procedure: LEFT TOTAL KNEE ARTHROPLASTY;  Surgeon: Marybelle Killings, MD;  Location: [place];  Service: Orthopedics;  Laterality: Left;   TUBAL LIGATION       reports that she quit smoking about 38 years ago. Her smoking use included cigarettes. She has a 15.00 pack-year smoking history. She has never used smokeless tobacco. She reports that she does not drink alcohol and does not use drugs.  Allergies  Allergen Reactions   Shellfish Allergy Swelling and Other (See Comments)    Swelling - throat and lips  Topiramate     Other reaction(s): Chest Pain   Metamucil [Psyllium]    Lisinopril Other (See Comments)    cough   Penicillins Hives, Rash and Other (See Comments)    Has patient had a PCN reaction causing immediate rash, facial/tongue/throat swelling, SOB or lightheadedness with hypotension: Unknown Has patient had a PCN reaction causing severe rash involving mucus membranes or skin necrosis: No Has patient had a PCN reaction that required hospitalization: No Has patient had a PCN reaction occurring within the last 10 years: No If all of the above answers are "NO", then may proceed with Cephalosporin use.     Tramadol Other (See Comments)    confusion    Family History  Problem Relation Age of Onset   Stroke Mother 22   Heart attack Father    Heart attack Sister    Heart disease Sister    Diabetes Sister    Thyroid cancer Sister    Diabetes Brother    Colon cancer Brother    Breast cancer Neg Hx    Esophageal cancer Neg Hx    Pancreatic cancer Neg Hx    Stomach cancer Neg Hx     Prior to Admission medications   Medication Sig Start Date End Date Taking? Authorizing Provider  acetaminophen (TYLENOL) 325 MG tablet Take 2 tablets (650 mg total) by mouth every 6 (six) hours as needed. 08/26/20  Yes Jill Alexanders, PA-C  alendronate (FOSAMAX) 70 MG tablet Take 70 mg by mouth every Monday. Take with a full glass of water on an empty stomach. Take on mondays   Yes [provider]  aspirin EC 81 MG tablet Take 81 mg by mouth daily. Swallow whole.   Yes [provider]  Calcium Carb-Cholecalciferol (CALCIUM 600/VITAMIN D3 PO) Take 1 tablet by mouth daily.   Yes [provider]  carvedilol (COREG) 6.25 MG tablet Take 6.25 mg by mouth 2 (two) times daily.   Yes [provider]  diazepam (VALIUM) 2 MG tablet Take 1 mg by mouth daily as needed for anxiety. 11/25/19  Yes [provider]  HYDROcodone-acetaminophen (NORCO/VICODIN) 5-325 MG tablet Take 1 tablet by mouth every 6 (six) hours as needed for moderate pain.   Yes [provider]  losartan (COZAAR) 100 MG tablet TAKE 1 TABLET(100 MG) BY MOUTH DAILY Patient taking differently: Take 100 mg by mouth daily. 12/06/21  Yes Adrian Prows, MD  metFORMIN (GLUCOPHAGE-XR) 500 MG 24 hr tablet Take 500 mg by mouth daily. 07/08/19  Yes [provider]  pantoprazole (PROTONIX) 40 MG tablet Take 1 tablet (40 mg total) by mouth 2 (two) times daily before a meal. 10/31/21  Yes Ladene Artist, MD  potassium chloride SA (KLOR-CON) 20 MEQ tablet Take 20 mEq by mouth daily. 10/28/20  Yes [provider]  simvastatin (ZOCOR) 40 MG tablet Take 40 mg by mouth at bedtime.   Yes [provider]  spironolactone (ALDACTONE) 25 MG tablet TAKE 1 TABLET(25 MG) BY MOUTH DAILY Patient taking differently: Take 25 mg by mouth daily. 11/03/21  Yes Adrian Prows, MD  sucralfate (CARAFATE) 1 GM/10ML suspension Take 10 mLs (1 g total) by mouth 4 (four) times daily -  with meals and at bedtime. 02/09/22  Yes Redwine, Madison A, PA-C    Physical Exam: Constitutional: Moderately built and nourished. Vitals:   02/08/22 2100 02/08/22 2115 02/08/22 2130 02/09/22 0115  BP: (!) 123/52 (!) 130/42 (!) 129/39 (!) 112/44  Pulse: (!) 46 Marland Kitchen)  50 (!) 52 (!) 50  Resp: '14 17 16 14  '$ Temp:      TempSrc:      SpO2: 100% 100% 99% 100%  Weight:      Height:       Eyes: Anicteric no pallor. ENMT: No discharge from the ears eyes nose and mouth. Neck: No mass felt.  No neck rigidity. Respiratory: No rhonchi or crepitations. Cardiovascular: S1-S2 heard. Abdomen: Soft epigastric tenderness present no guarding or rigidity. Musculoskeletal: No edema. Skin: No rash. Neurologic: Alert awake oriented to time place and person.  Moves all extremities. Psychiatric: Appears normal.  Normal affect.   Labs on Admission: I have personally reviewed following labs and imaging studies  CBC: Recent Labs  Lab 02/08/22 1421  WBC 5.8  HGB 12.5  HCT 39.1  MCV 98.7  PLT 659   Basic Metabolic Panel: Recent Labs  Lab 02/08/22 1421  NA 136  K 4.2  CL 105  CO2 24  GLUCOSE 102*  BUN 16  CREATININE 0.73  CALCIUM 9.5   GFR: Estimated Creatinine Clearance: 57.3 mL/min (by C-G formula based on SCr of 0.73 mg/dL). Liver Function Tests: Recent Labs  Lab 02/08/22 1933  AST 91*  ALT 102*  ALKPHOS 114  BILITOT 2.5*  PROT 6.4*  ALBUMIN 3.5   Recent Labs  Lab 02/08/22 1933  LIPASE 29   No results for input(s): "AMMONIA" in the last 168 hours. Coagulation Profile: No results for input(s): "INR", "PROTIME" in the  last 168 hours. Cardiac Enzymes: No results for input(s): "CKTOTAL", "CKMB", "CKMBINDEX", "TROPONINI" in the last 168 hours. BNP (last 3 results) No results for input(s): "PROBNP" in the last 8760 hours. HbA1C: No results for input(s): "HGBA1C" in the last 72 hours. CBG: No results for input(s): "GLUCAP" in the last 168 hours. Lipid Profile: No results for input(s): "CHOL", "HDL", "LDLCALC", "TRIG", "CHOLHDL", "LDLDIRECT" in the last 72 hours. Thyroid Function Tests: No results for input(s): "TSH", "T4TOTAL", "FREET4", "T3FREE", "THYROIDAB" in the last 72 hours. Anemia Panel: No results for input(s): "VITAMINB12", "FOLATE", "FERRITIN", "TIBC", "IRON", "RETICCTPCT" in the last 72 hours. Urine analysis:    Component Value Date/Time   COLORURINE YELLOW 05/09/2020 [place] 05/09/2020 1127   LABSPEC 1.036 (H) 05/09/2020 1127   PHURINE 5.0 05/09/2020 1127   GLUCOSEU >=500 (A) 05/09/2020 1127   HGBUR SMALL (A) 05/09/2020 1127   BILIRUBINUR NEGATIVE 05/09/2020 1127   KETONESUR NEGATIVE 05/09/2020 1127   PROTEINUR NEGATIVE 05/09/2020 1127   UROBILINOGEN 1.0 11/30/2013 1224   NITRITE NEGATIVE 05/09/2020 1127   LEUKOCYTESUR NEGATIVE 05/09/2020 1127   Sepsis Labs: '@LABRCNTIP'$ (procalcitonin:4,lacticidven:4) )No results found for this or any previous visit (from the past 240 hour(s)).   Radiological Exams on Admission: CT ABDOMEN PELVIS W CONTRAST  Result Date: 02/09/2022 CLINICAL DATA:  Acute abdominal pain for several days, initial encounter EXAM: CT ABDOMEN AND PELVIS WITH CONTRAST TECHNIQUE: Multidetector CT imaging of the abdomen and pelvis was performed using the standard protocol following bolus administration of intravenous contrast. RADIATION DOSE REDUCTION: This exam was performed according to the departmental dose-optimization program which includes automated exposure control, adjustment of the mA and/or kV according to patient size and/or use of iterative  reconstruction technique. CONTRAST:  171m OMNIPAQUE IOHEXOL 300 MG/ML  SOLN COMPARISON:  Ultrasound from earlier in the same day. FINDINGS: Lower chest: No acute abnormality. Hepatobiliary: Gallbladder has been surgically removed. Biliary ductal dilatation is noted. In the distal common bile duct there is a rounded filling defect  consistent with choledocholithiasis/sludge ball. Liver is within normal limits otherwise. Pancreas: Unremarkable. No pancreatic ductal dilatation or surrounding inflammatory changes. Spleen: Normal in size without focal abnormality. Adrenals/Urinary Tract: Adrenal glands are within normal limits. Kidneys are within normal limits. No obstructive changes are noted. Bladder is partially distended. Stomach/Bowel: Diverticular change of the colon is noted without evidence of diverticulitis. No obstructive or inflammatory changes are seen. The appendix has been surgically removed. No small bowel or gastric abnormality is noted. Vascular/Lymphatic: Aortic atherosclerosis. No enlarged abdominal or pelvic lymph nodes. Reproductive: Status post hysterectomy. No adnexal masses. Other: No abdominal wall hernia or abnormality. No abdominopelvic ascites. Musculoskeletal: Degenerative changes of the lumbar spine are noted. IMPRESSION: Status post cholecystectomy. Common bile duct dilatation is noted with a focal filling defect distally which may represent a small sludge ball or distal gallstone. Nonemergent MRCP may be helpful when the patient's condition improves to allow for optimum imaging. Diverticulosis without diverticulitis. No other focal abnormality is seen. Electronically Signed   By: Inez Catalina M.D.   On: 02/09/2022 01:03   US Abdomen Limited RUQ (LIVER/GB)  Result Date: 02/09/2022 CLINICAL DATA:  Right upper quadrant pain EXAM: ULTRASOUND ABDOMEN LIMITED RIGHT UPPER QUADRANT COMPARISON:  Ultrasound 08/23/2020 FINDINGS: Gallbladder: Surgically absent. Negative sonographic Murphy's sign  performed by sonographer. Common bile duct: Diameter: 11 mm Liver: No focal lesion identified. Within normal limits in parenchymal echogenicity. Portal vein is patent on color Doppler imaging with normal direction of blood flow towards the liver. Other: None. IMPRESSION: Status post cholecystectomy. The common bile duct measures 11 mm in diameter possibly due to reservoir effect. Electronically Signed   By: Placido Sou M.D.   On: 02/09/2022 00:44   DG Chest 2 View  Result Date: 02/08/2022 CLINICAL DATA:  Chest pain. EXAM: CHEST - 2 VIEW COMPARISON:  Chest x-ray 08/22/2020 FINDINGS: The heart size and mediastinal contours are within normal limits. Both lungs are clear. The visualized skeletal structures are unremarkable. IMPRESSION: No active cardiopulmonary disease. Electronically Signed   By: Ronney Asters M.D.   On: 02/08/2022 15:12    EKG: Independently reviewed.  Normal sinus rhythm with PVCs.  Assessment/Plan Principal Problem:   Common bile duct (CBD) obstruction Active Problems:   Essential hypertension    CBD obstruction likely from gallstones for which Dr. Haywood Lasso to [place] has been consulted.  Patient may need ERCP.  We will keep patient NPO.  IV fluids pain medication.  Patient is afebrile at this time and no signs of cholangitis. History of hypertension we will keep patient.  IV hydralazine while NPO. She of CHF closely monitor for any worsening of respiratory status.  Presently NPO. Diabetes mellitus type 2 last hemoglobin A1c was 5.65 months ago takes metformin at home presently on sliding scale coverage. History of anemia follow CBC. Elevated LFTs likely from CBD obstruction follow LFTs.  Since patient has CBD obstruction will need close monitoring and inpatient status for further management.    DVT prophylaxis: SCDs.  Avoiding anticoagulation in anticipation of procedure. Code Status: Full code. Family Communication: Family at the bedside. Disposition Plan:  Home. Consults called: Copywriter, advertising. Admission status: Inpatient.   Rise Patience MD [facility] Pager [phone].  If 7PM-7AM, please contact night-coverage [email or web address] Password [facility]  02/09/2022, 4:54 AM

## 2022-02-09 NOTE — ED Notes (Signed)
Patient is now back in bed on the monitor placed a external cath patient is resting with call bell in reach and bed rails up

## 2022-02-09 NOTE — Anesthesia Procedure Notes (Signed)
Procedure Name: Intubation Date/Time: 02/09/2022 4:20 PM  Performed by: Anastasio Auerbach, CRNAPre-anesthesia Checklist: Patient identified, Emergency Drugs available, Suction available and Patient being monitored Patient Re-evaluated:Patient Re-evaluated prior to induction Oxygen Delivery Method: Circle system utilized Preoxygenation: Pre-oxygenation with 100% oxygen Induction Type: IV induction Ventilation: Mask ventilation without difficulty Laryngoscope Size: Mac and 3 Grade View: Grade I Tube type: Oral Tube size: 7.0 mm Number of attempts: 1 Airway Equipment and Method: Stylet, Oral airway and Bite block Placement Confirmation: ETT inserted through vocal cords under direct vision, positive ETCO2 and breath sounds checked- equal and bilateral Secured at: 20 cm Tube secured with: Tape Dental Injury: Teeth and Oropharynx as per pre-operative assessment

## 2022-02-09 NOTE — Progress Notes (Signed)
PROGRESS NOTE  Julie Graves SEG:[id number] DOB: 1940-10-11 DOA: 02/08/2022 PCP: Harrison Mons, PA  HPI/Recap of past 24 hours: Julie Graves is a 81 y.o. female with history of CHF, hypertension, diabetes mellitus and prior cholecystectomy 3/22, has been experiencing epigastric abdominal pain with nausea vomiting over the last 5 days which has been gradually worsening. In the ER ultrasound of the abdomen showed CBD dilatation and CT abdomen pelvis shows possible distal CBD stone. LFTs elevated. GI consulted. Patient admitted for CBD obstruction likely from gallstones.   Today, patient still reporting epigastric abdominal pain, with some nausea, denies any recent vomiting.  Assessment/Plan: Principal Problem:   Common bile duct (CBD) obstruction Active Problems:   Essential hypertension   Choledocholithiasis Elevated LFTs CBD obstruction likely from gallstones (hx of cholecystectomy 08/2020) Noted elevated LFTs Currently afebrile with no leukocytosis CT abdomen/pelvis, right upper quadrant liver ultrasound showed choledocholithiasis GI on board, plan for ERCP N.p.o., IV fluids, pain management Monitor closely  Hypertension IV hydralazine for now while n.p.o.  History of CHF/CAD Appears euvolemic/stable Currently chest pain-free Last echo showed abdominal on 08/2020 showed EF of 50 to 55%, no regional wall motion abnormality, left ventricular diastolic parameters were multiple Monitor closely, strict I's and O's, daily weights  Diabetes mellitus type 2 Last A1c 5.9 in 11/21, will repeat SSI, Accu-Cheks, hypoglycemic protocol  Chronic GERD Continue PPI  Morbid obesity Lifestyle modification advised     Estimated body mass index is 40.23 kg/m as calculated from the following:   Height as of this encounter: 5' (1.524 m).   Weight as of this encounter: 93.4 kg.     Code Status: Full  Family Communication: None at bedside  Disposition Plan: Status is:  Inpatient Remains inpatient appropriate because: Level of care      Consultants: GI  Procedures: ERCP  Antimicrobials: None  DVT prophylaxis: SCDs   Objective: Vitals:   02/09/22 0600 02/09/22 0915 02/09/22 1000 02/09/22 1100  BP: (!) 106/95 (!) 142/50 (!) 147/57 (!) 133/58  Pulse:  60 (!) 57 (!) 51  Resp: (!) '22 20 20 18  '$ Temp:      TempSrc:      SpO2: 99% 98% 99% 98%  Weight:      Height:       No intake or output data in the 24 hours ending 02/09/22 1331 Filed Weights   02/08/22 1835  Weight: 93.4 kg    Exam: General: NAD, obese Cardiovascular: S1, S2 present Respiratory: CTAB Abdomen: Soft, nontender, nondistended, bowel sounds present Musculoskeletal: No bilateral pedal edema noted Skin: Normal Psychiatry: Normal mood     Data Reviewed: CBC: Recent Labs  Lab 02/08/22 1421  WBC 5.8  HGB 12.5  HCT 39.1  MCV 98.7  PLT 737   Basic Metabolic Panel: Recent Labs  Lab 02/08/22 1421  NA 136  K 4.2  CL 105  CO2 24  GLUCOSE 102*  BUN 16  CREATININE 0.73  CALCIUM 9.5   GFR: Estimated Creatinine Clearance: 57.3 mL/min (by C-G formula based on SCr of 0.73 mg/dL). Liver Function Tests: Recent Labs  Lab 02/08/22 1933  AST 91*  ALT 102*  ALKPHOS 114  BILITOT 2.5*  PROT 6.4*  ALBUMIN 3.5   Recent Labs  Lab 02/08/22 1933  LIPASE 29   No results for input(s): "AMMONIA" in the last 168 hours. Coagulation Profile: No results for input(s): "INR", "PROTIME" in the last 168 hours. Cardiac Enzymes: No results for input(s): "CKTOTAL", "CKMB", "CKMBINDEX", "TROPONINI" in  the last 168 hours. BNP (last 3 results) No results for input(s): "PROBNP" in the last 8760 hours. HbA1C: No results for input(s): "HGBA1C" in the last 72 hours. CBG: No results for input(s): "GLUCAP" in the last 168 hours. Lipid Profile: No results for input(s): "CHOL", "HDL", "LDLCALC", "TRIG", "CHOLHDL", "LDLDIRECT" in the last 72 hours. Thyroid Function Tests: No  results for input(s): "TSH", "T4TOTAL", "FREET4", "T3FREE", "THYROIDAB" in the last 72 hours. Anemia Panel: No results for input(s): "VITAMINB12", "FOLATE", "FERRITIN", "TIBC", "IRON", "RETICCTPCT" in the last 72 hours. Urine analysis:    Component Value Date/Time   COLORURINE YELLOW 05/09/2020 [place] 05/09/2020 1127   LABSPEC 1.036 (H) 05/09/2020 1127   PHURINE 5.0 05/09/2020 1127   GLUCOSEU >=500 (A) 05/09/2020 1127   HGBUR SMALL (A) 05/09/2020 1127   BILIRUBINUR NEGATIVE 05/09/2020 1127   KETONESUR NEGATIVE 05/09/2020 1127   PROTEINUR NEGATIVE 05/09/2020 1127   UROBILINOGEN 1.0 11/30/2013 1224   NITRITE NEGATIVE 05/09/2020 1127   LEUKOCYTESUR NEGATIVE 05/09/2020 1127   Sepsis Labs: '@LABRCNTIP'$ (procalcitonin:4,lacticidven:4)  )No results found for this or any previous visit (from the past 240 hour(s)).    Studies: CT ABDOMEN PELVIS W CONTRAST  Result Date: 02/09/2022 CLINICAL DATA:  Acute abdominal pain for several days, initial encounter EXAM: CT ABDOMEN AND PELVIS WITH CONTRAST TECHNIQUE: Multidetector CT imaging of the abdomen and pelvis was performed using the standard protocol following bolus administration of intravenous contrast. RADIATION DOSE REDUCTION: This exam was performed according to the departmental dose-optimization program which includes automated exposure control, adjustment of the mA and/or kV according to patient size and/or use of iterative reconstruction technique. CONTRAST:  142m OMNIPAQUE IOHEXOL 300 MG/ML  SOLN COMPARISON:  Ultrasound from earlier in the same day. FINDINGS: Lower chest: No acute abnormality. Hepatobiliary: Gallbladder has been surgically removed. Biliary ductal dilatation is noted. In the distal common bile duct there is a rounded filling defect consistent with choledocholithiasis/sludge ball. Liver is within normal limits otherwise. Pancreas: Unremarkable. No pancreatic ductal dilatation or surrounding inflammatory changes.  Spleen: Normal in size without focal abnormality. Adrenals/Urinary Tract: Adrenal glands are within normal limits. Kidneys are within normal limits. No obstructive changes are noted. Bladder is partially distended. Stomach/Bowel: Diverticular change of the colon is noted without evidence of diverticulitis. No obstructive or inflammatory changes are seen. The appendix has been surgically removed. No small bowel or gastric abnormality is noted. Vascular/Lymphatic: Aortic atherosclerosis. No enlarged abdominal or pelvic lymph nodes. Reproductive: Status post hysterectomy. No adnexal masses. Other: No abdominal wall hernia or abnormality. No abdominopelvic ascites. Musculoskeletal: Degenerative changes of the lumbar spine are noted. IMPRESSION: Status post cholecystectomy. Common bile duct dilatation is noted with a focal filling defect distally which may represent a small sludge ball or distal gallstone. Nonemergent MRCP may be helpful when the patient's condition improves to allow for optimum imaging. Diverticulosis without diverticulitis. No other focal abnormality is seen. Electronically Signed   By: MInez CatalinaM.D.   On: 02/09/2022 01:03   UKoreaAbdomen Limited RUQ (LIVER/GB)  Result Date: 02/09/2022 CLINICAL DATA:  Right upper quadrant pain EXAM: ULTRASOUND ABDOMEN LIMITED RIGHT UPPER QUADRANT COMPARISON:  Ultrasound 08/23/2020 FINDINGS: Gallbladder: Surgically absent. Negative sonographic Murphy's sign performed by sonographer. Common bile duct: Diameter: 11 mm Liver: No focal lesion identified. Within normal limits in parenchymal echogenicity. Portal vein is patent on color Doppler imaging with normal direction of blood flow towards the liver. Other: None. IMPRESSION: Status post cholecystectomy. The common bile duct measures 11 mm in diameter  possibly due to reservoir effect. Electronically Signed   By: Placido Sou M.D.   On: 02/09/2022 00:44   DG Chest 2 View  Result Date: 02/08/2022 CLINICAL DATA:   Chest pain. EXAM: CHEST - 2 VIEW COMPARISON:  Chest x-ray 08/22/2020 FINDINGS: The heart size and mediastinal contours are within normal limits. Both lungs are clear. The visualized skeletal structures are unremarkable. IMPRESSION: No active cardiopulmonary disease. Electronically Signed   By: Ronney Asters M.D.   On: 02/08/2022 15:12    Scheduled Meds:  insulin aspart  0-6 Units Subcutaneous Q4H    Continuous Infusions:  lactated ringers       LOS: 0 days     Alma Friendly, MD [facility]  If 7PM-7AM, please contact night-coverage [email or web address] 02/09/2022, 1:31 PM

## 2022-02-10 DIAGNOSIS — R748 Abnormal levels of other serum enzymes: Secondary | ICD-10-CM

## 2022-02-10 DIAGNOSIS — K831 Obstruction of bile duct: Secondary | ICD-10-CM | POA: Diagnosis not present

## 2022-02-10 DIAGNOSIS — I10 Essential (primary) hypertension: Secondary | ICD-10-CM | POA: Diagnosis not present

## 2022-02-10 DIAGNOSIS — R110 Nausea: Secondary | ICD-10-CM

## 2022-02-10 LAB — CBC WITH DIFFERENTIAL/PLATELET
Abs Immature Granulocytes: 0.08 10*3/uL — ABNORMAL HIGH (ref 0.00–0.07)
Basophils Absolute: 0 10*3/uL (ref 0.0–0.1)
Basophils Relative: 0 %
Eosinophils Absolute: 0.1 10*3/uL (ref 0.0–0.5)
Eosinophils Relative: 1 %
HCT: 38 % (ref 36.0–46.0)
Hemoglobin: 12.4 g/dL (ref 12.0–15.0)
Immature Granulocytes: 1 %
Lymphocytes Relative: 15 %
Lymphs Abs: 1.2 10*3/uL (ref 0.7–4.0)
MCH: 31.6 pg (ref 26.0–34.0)
MCHC: 32.6 g/dL (ref 30.0–36.0)
MCV: 96.7 fL (ref 80.0–100.0)
Monocytes Absolute: 0.7 10*3/uL (ref 0.1–1.0)
Monocytes Relative: 9 %
Neutro Abs: 5.9 10*3/uL (ref 1.7–7.7)
Neutrophils Relative %: 74 %
Platelets: 240 10*3/uL (ref 150–400)
RBC: 3.93 MIL/uL (ref 3.87–5.11)
RDW: 12.9 % (ref 11.5–15.5)
WBC: 8 10*3/uL (ref 4.0–10.5)
nRBC: 0 % (ref 0.0–0.2)

## 2022-02-10 LAB — COMPREHENSIVE METABOLIC PANEL
ALT: 86 U/L — ABNORMAL HIGH (ref 0–44)
AST: 62 U/L — ABNORMAL HIGH (ref 15–41)
Albumin: 3.1 g/dL — ABNORMAL LOW (ref 3.5–5.0)
Alkaline Phosphatase: 105 U/L (ref 38–126)
Anion gap: 8 (ref 5–15)
BUN: 10 mg/dL (ref 8–23)
CO2: 25 mmol/L (ref 22–32)
Calcium: 9.4 mg/dL (ref 8.9–10.3)
Chloride: 106 mmol/L (ref 98–111)
Creatinine, Ser: 0.75 mg/dL (ref 0.44–1.00)
GFR, Estimated: >60 mL/min (ref >60)
Glucose, Bld: 75 mg/dL (ref 70–99)
Potassium: 3.8 mmol/L (ref 3.5–5.1)
Sodium: 139 mmol/L (ref 135–145)
Total Bilirubin: 1.2 mg/dL (ref 0.3–1.2)
Total Protein: 5.9 g/dL — ABNORMAL LOW (ref 6.5–8.1)

## 2022-02-10 LAB — GLUCOSE, CAPILLARY
Glucose-Capillary: 92 mg/dL (ref 70–99)
Glucose-Capillary: 75 mg/dL (ref 70–99)
Glucose-Capillary: 87 mg/dL (ref 70–99)
Glucose-Capillary: 97 mg/dL (ref 70–99)
Glucose-Capillary: 86 mg/dL (ref 70–99)
Glucose-Capillary: 116 mg/dL — ABNORMAL HIGH (ref 70–99)

## 2022-02-10 LAB — LIPASE, BLOOD: Lipase: 24 U/L (ref 11–51)

## 2022-02-10 MED ORDER — PANTOPRAZOLE SODIUM 40 MG PO TBEC
40.0000 mg | DELAYED_RELEASE_TABLET | Freq: Two times a day (BID) | ORAL | Status: DC
  Administered 2022-02-10 – 2022-02-11 (×3): 40 mg via ORAL
  Filled 2022-02-10 (×3): qty 1

## 2022-02-10 MED ORDER — ONDANSETRON HCL 4 MG/2ML IJ SOLN
4.0000 mg | Freq: Four times a day (QID) | INTRAMUSCULAR | Status: DC | PRN
  Administered 2022-02-10: 4 mg via INTRAVENOUS
  Filled 2022-02-10: qty 2

## 2022-02-10 MED ORDER — PANTOPRAZOLE SODIUM 40 MG PO TBEC: 40.0000 mg | DELAYED_RELEASE_TABLET | Freq: Every day | ORAL | Status: DC

## 2022-02-10 NOTE — Progress Notes (Signed)
PROGRESS NOTE  Julie Graves KGY:[id number] DOB: 15-Jul-1940 DOA: 02/08/2022 PCP: Harrison Mons, PA  HPI/Recap of past 24 hours: Julie Graves is a 81 y.o. female with history of CHF, hypertension, diabetes mellitus and prior cholecystectomy 3/22, has been experiencing epigastric abdominal pain with nausea vomiting over the last 5 days which has been gradually worsening. In the ER ultrasound of the abdomen showed CBD dilatation and CT abdomen pelvis shows possible distal CBD stone. LFTs elevated. GI consulted. Patient admitted for CBD obstruction likely from gallstones.   Today, patient reports nausea, denies any worsening abdominal pain, denies any chest pain, shortness of breath, fever/chills.    Assessment/Plan: Principal Problem:   Common bile duct (CBD) obstruction Active Problems:   Essential hypertension   Choledocholithiasis s/p sphincterotomy and stone extraction via ERCP CBD obstruction likely from gallstones (hx of cholecystectomy 08/2020) Noted elevated LFTs, trending down Currently afebrile with no leukocytosis CT abdomen/pelvis, right upper quadrant liver ultrasound showed choledocholithiasis GI on board, appreciate recs Clear liquid diet, advance as tolerated, antiemetics  Hypertension IV hydralazine for now while n.p.o.  History of CHF/CAD Appears euvolemic/stable Currently chest pain-free Last echo showed abdominal on 08/2020 showed EF of 50 to 55%, no regional wall motion abnormality, left ventricular diastolic parameters were multiple Monitor closely, strict I's and O's, daily weights  Diabetes mellitus type 2 A1c 5.7 SSI, Accu-Cheks, hypoglycemic protocol  Chronic GERD Continue PPI  Morbid obesity Lifestyle modification advised     Estimated body mass index is 40.23 kg/m as calculated from the following:   Height as of this encounter: 5' (1.524 m).   Weight as of this encounter: 93.4 kg.     Code Status: Full  Family  Communication: None at bedside  Disposition Plan: Status is: Inpatient Remains inpatient appropriate because: Level of care      Consultants: GI  Procedures: ERCP  Antimicrobials: None  DVT prophylaxis: SCDs   Objective: Vitals:   02/09/22 1800 02/09/22 1839 02/10/22 0555 02/10/22 0729  BP: 100/75 (!) 131/55 (!) 140/54 (!) 126/53  Pulse: 65 (!) 57 (!) 51 (!) 59  Resp: '17 18 19 17  '$ Temp: (!) 97.5 F (36.4 C) 97.7 F (36.5 C) 98.3 F (36.8 C) 98 F (36.7 C)  TempSrc:  Oral Oral Oral  SpO2: 95% 94%  99%  Weight:      Height:        Intake/Output Summary (Last 24 hours) at 02/10/2022 1631 Last data filed at 02/10/2022 0263 Gross per 24 hour  Intake 800 ml  Output 600 ml  Net 200 ml   Filed Weights   02/08/22 1835  Weight: 93.4 kg    Exam: General: NAD, obese Cardiovascular: S1, S2 present Respiratory: CTAB Abdomen: Soft, nontender, nondistended, bowel sounds present Musculoskeletal: No bilateral pedal edema noted Skin: Normal Psychiatry: Normal mood     Data Reviewed: CBC: Recent Labs  Lab 02/08/22 1421 02/10/22 0102  WBC 5.8 8.0  NEUTROABS  --  5.9  HGB 12.5 12.4  HCT 39.1 38.0  MCV 98.7 96.7  PLT 246 785   Basic Metabolic Panel: Recent Labs  Lab 02/08/22 1421 02/10/22 0102  NA 136 139  K 4.2 3.8  CL 105 106  CO2 24 25  GLUCOSE 102* 75  BUN 16 10  CREATININE 0.73 0.75  CALCIUM 9.5 9.4   GFR: Estimated Creatinine Clearance: 57.3 mL/min (by C-G formula based on SCr of 0.75 mg/dL). Liver Function Tests: Recent Labs  Lab 02/08/22 1933 02/10/22 0102  AST 91* 62*  ALT 102* 86*  ALKPHOS 114 105  BILITOT 2.5* 1.2  PROT 6.4* 5.9*  ALBUMIN 3.5 3.1*   Recent Labs  Lab 02/08/22 1933 02/10/22 0102  LIPASE 29 24   No results for input(s): "AMMONIA" in the last 168 hours. Coagulation Profile: Recent Labs  Lab 02/09/22 1845  INR 1.1   Cardiac Enzymes: No results for input(s): "CKTOTAL", "CKMB", "CKMBINDEX", "TROPONINI" in  the last 168 hours. BNP (last 3 results) No results for input(s): "PROBNP" in the last 8760 hours. HbA1C: Recent Labs    02/09/22 1845  HGBA1C 5.7*   CBG: Recent Labs  Lab 02/09/22 2023 02/10/22 0205 02/10/22 0554 02/10/22 0820 02/10/22 1227  GLUCAP 154* 92 75 87 97   Lipid Profile: No results for input(s): "CHOL", "HDL", "LDLCALC", "TRIG", "CHOLHDL", "LDLDIRECT" in the last 72 hours. Thyroid Function Tests: No results for input(s): "TSH", "T4TOTAL", "FREET4", "T3FREE", "THYROIDAB" in the last 72 hours. Anemia Panel: No results for input(s): "VITAMINB12", "FOLATE", "FERRITIN", "TIBC", "IRON", "RETICCTPCT" in the last 72 hours. Urine analysis:    Component Value Date/Time   COLORURINE YELLOW 05/09/2020 [place] 05/09/2020 1127   LABSPEC 1.036 (H) 05/09/2020 1127   PHURINE 5.0 05/09/2020 1127   GLUCOSEU >=500 (A) 05/09/2020 1127   HGBUR SMALL (A) 05/09/2020 1127   BILIRUBINUR NEGATIVE 05/09/2020 1127   KETONESUR NEGATIVE 05/09/2020 1127   PROTEINUR NEGATIVE 05/09/2020 1127   UROBILINOGEN 1.0 11/30/2013 1224   NITRITE NEGATIVE 05/09/2020 1127   LEUKOCYTESUR NEGATIVE 05/09/2020 1127   Sepsis Labs: '@LABRCNTIP'$ (LKGMWNUUVOZDG:6,YQIHKVQQVZD:6)  )No results found for this or any previous visit (from the past 240 hour(s)).    Studies: DG ERCP  Result Date: 02/10/2022 CLINICAL DATA:  Probable choledocholithiasis EXAM: ERCP TECHNIQUE: Multiple spot images obtained with the fluoroscopic device and submitted for interpretation post-procedure. FLUOROSCOPY: Radiation Exposure Index (as provided by the fluoroscopic device): 47.596 mGy Kerma COMPARISON:  CT abdomen/pelvis 02/09/2022 FINDINGS: Ten intraoperative saved images are submitted for review. The images demonstrate a flexible duodenal scope in the descending duodenum followed by wire cannulation of the common bile duct and cholangiogram. Mild biliary ductal dilatation. Faceted filling defect in the distal common  bile duct consistent with choledocholithiasis. Subsequent images demonstrate balloon sphincterotomy and balloon sweeping of the common duct. Evidence of prior cholecystectomy is present with surgical clips and a cystic duct stump. IMPRESSION: 1. Choledocholithiasis. 2. ERCP with balloon sphincterotomy and balloon sweeping of the common duct. These images were submitted for radiologic interpretation only. Please see the procedural report for the amount of contrast and the fluoroscopy time utilized. Electronically Signed   By: Jacqulynn Cadet M.D.   On: 02/10/2022 06:37    Scheduled Meds:  diclofenac  100 mg Rectal Once   insulin aspart  0-6 Units Subcutaneous Q4H   pantoprazole  40 mg Oral BID    Continuous Infusions:     LOS: 1 day     Alma Friendly, MD [facility]  If 7PM-7AM, please contact night-coverage [email or web address] 02/10/2022, 4:31 PM

## 2022-02-10 NOTE — Progress Notes (Signed)
Patient ID: Julie Graves, female   DOB: 12/18/1940, 81 y.o.   MRN: [id number]    Progress Note   Subjective   Day # 2  CC; acute severe epigastric pain, N/V  ERCP yesterday with successful sphincterotomy and stone extraction, 1-12 mm stone removed,, no purulence, minimal oozing at the sphincterotomy site/stopped on its own  WBC 8.0/hemoglobin 12.4/hematocrit 38.0 stable  T. bili 1.2/alk phos 105/AST 62/ALT 86-improved  Patient says she has been nauseated this morning, no vomiting, not much appetite, some persistent abdominal discomfort/pain which feels a bit different than the pain that brought her in. She does not feel ready to be discharged.     Objective   Vital signs in last 24 hours: Temp:  [97.3 F (36.3 C)-98.3 F (36.8 C)] 98 F (36.7 C) (08/19 0729) Pulse Rate:  [51-75] 59 (08/19 0729) Resp:  [17-20] 17 (08/19 0729) BP: (100-171)/(49-97) 126/53 (08/19 0729) SpO2:  [94 %-100 %] 99 % (08/19 0729)   General:    Elderly African-American female in NAD, uncomfortable appearing Heart:  Regular rate and rhythm; no murmurs Lungs: Respirations even and unlabored, lungs CTA bilaterally Abdomen:  Soft, mild tenderness in the epigastrium nondistended. Normal bowel sounds. Extremities:  Without edema. Neurologic:  Alert and oriented,  grossly normal neurologically. Psych:  Cooperative. Normal mood and affect.  Intake/Output from previous day: 08/18 0701 - 08/19 0700 In: 800 [I.V.:600; IV Piggyback:200] Out: 600 [Urine:600] Intake/Output this shift: No intake/output data recorded.  Lab Results: Recent Labs    02/08/22 1421 02/10/22 0102  WBC 5.8 8.0  HGB 12.5 12.4  HCT 39.1 38.0  PLT 246 240   BMET Recent Labs    02/08/22 1421 02/10/22 0102  NA 136 139  K 4.2 3.8  CL 105 106  CO2 24 25  GLUCOSE 102* 75  BUN 16 10  CREATININE 0.73 0.75  CALCIUM 9.5 9.4   LFT Recent Labs    02/08/22 1933 02/10/22 0102  PROT 6.4* 5.9*  ALBUMIN 3.5 3.1*  AST 91*  62*  ALT 102* 86*  ALKPHOS 114 105  BILITOT 2.5* 1.2  BILIDIR 1.2*  --   IBILI 1.3*  --    PT/INR Recent Labs    02/09/22 1845  LABPROT 13.6  INR 1.1    Studies/Results: DG ERCP  Result Date: 02/10/2022 CLINICAL DATA:  Probable choledocholithiasis EXAM: ERCP TECHNIQUE: Multiple spot images obtained with the fluoroscopic device and submitted for interpretation post-procedure. FLUOROSCOPY: Radiation Exposure Index (as provided by the fluoroscopic device): 47.596 mGy Kerma COMPARISON:  CT abdomen/pelvis 02/09/2022 FINDINGS: Ten intraoperative saved images are submitted for review. The images demonstrate a flexible duodenal scope in the descending duodenum followed by wire cannulation of the common bile duct and cholangiogram. Mild biliary ductal dilatation. Faceted filling defect in the distal common bile duct consistent with choledocholithiasis. Subsequent images demonstrate balloon sphincterotomy and balloon sweeping of the common duct. Evidence of prior cholecystectomy is present with surgical clips and a cystic duct stump. IMPRESSION: 1. Choledocholithiasis. 2. ERCP with balloon sphincterotomy and balloon sweeping of the common duct. These images were submitted for radiologic interpretation only. Please see the procedural report for the amount of contrast and the fluoroscopy time utilized. Electronically Signed   By: Heath  McCullough M.D.   On: 02/10/2022 06:37   CT ABDOMEN PELVIS W CONTRAST  Result Date: 02/09/2022 CLINICAL DATA:  Acute abdominal pain for several days, initial encounter EXAM: CT ABDOMEN AND PELVIS WITH CONTRAST TECHNIQUE: Multidetector CT imaging of the abdomen   and pelvis was performed using the standard protocol following bolus administration of intravenous contrast. RADIATION DOSE REDUCTION: This exam was performed according to the departmental dose-optimization program which includes automated exposure control, adjustment of the mA and/or kV according to patient size  and/or use of iterative reconstruction technique. CONTRAST:  128m OMNIPAQUE IOHEXOL 300 MG/ML  SOLN COMPARISON:  Ultrasound from earlier in the same day. FINDINGS: Lower chest: No acute abnormality. Hepatobiliary: Gallbladder has been surgically removed. Biliary ductal dilatation is noted. In the distal common bile duct there is a rounded filling defect consistent with choledocholithiasis/sludge ball. Liver is within normal limits otherwise. Pancreas: Unremarkable. No pancreatic ductal dilatation or surrounding inflammatory changes. Spleen: Normal in size without focal abnormality. Adrenals/Urinary Tract: Adrenal glands are within normal limits. Kidneys are within normal limits. No obstructive changes are noted. Bladder is partially distended. Stomach/Bowel: Diverticular change of the colon is noted without evidence of diverticulitis. No obstructive or inflammatory changes are seen. The appendix has been surgically removed. No small bowel or gastric abnormality is noted. Vascular/Lymphatic: Aortic atherosclerosis. No enlarged abdominal or pelvic lymph nodes. Reproductive: Status post hysterectomy. No adnexal masses. Other: No abdominal wall hernia or abnormality. No abdominopelvic ascites. Musculoskeletal: Degenerative changes of the lumbar spine are noted. IMPRESSION: Status post cholecystectomy. Common bile duct dilatation is noted with a focal filling defect distally which may represent a small sludge ball or distal gallstone. Nonemergent MRCP may be helpful when the patient's condition improves to allow for optimum imaging. Diverticulosis without diverticulitis. No other focal abnormality is seen. Electronically Signed   By: MInez CatalinaM.D.   On: 02/09/2022 01:03   UKoreaAbdomen Limited RUQ (LIVER/GB)  Result Date: 02/09/2022 CLINICAL DATA:  Right upper quadrant pain EXAM: ULTRASOUND ABDOMEN LIMITED RIGHT UPPER QUADRANT COMPARISON:  Ultrasound 08/23/2020 FINDINGS: Gallbladder: Surgically absent. Negative  sonographic Murphy's sign performed by sonographer. Common bile duct: Diameter: 11 mm Liver: No focal lesion identified. Within normal limits in parenchymal echogenicity. Portal vein is patent on color Doppler imaging with normal direction of blood flow towards the liver. Other: None. IMPRESSION: Status post cholecystectomy. The common bile duct measures 11 mm in diameter possibly due to reservoir effect. Electronically Signed   By: TPlacido SouM.D.   On: 02/09/2022 00:44   DG Chest 2 View  Result Date: 02/08/2022 CLINICAL DATA:  Chest pain. EXAM: CHEST - 2 VIEW COMPARISON:  Chest x-ray 08/22/2020 FINDINGS: The heart size and mediastinal contours are within normal limits. Both lungs are clear. The visualized skeletal structures are unremarkable. IMPRESSION: No active cardiopulmonary disease. Electronically Signed   By: ARonney AstersM.D.   On: 02/08/2022 15:12       Assessment / Plan:    [age]year old African-American female presenting with acute gastric pain nausea and vomiting onset 5 days prior to admission and progressive.  Work-up consistent with choledocholithiasis, no evidence for cholangitis.  She is status post laparoscopic cholecystectomy March 2022 for acute cholecystitis, had gangrenous gallbladder at that time, no IOC done.  ERCP was done yesterday with sphincterotomy and stone extraction of 112 mm stone, no purulence in the bile duct, slight oozing from the sphincterotomy site which stopped on its own.  Labs are improved today, however patient is complaining of nausea and persistent epigastric abdominal discomfort/pain Rule out mild post ERCP pancreatitis  #2 chronic GERD 3.  History of coronary artery disease 4.  Congestive heart failure with EF 50 to 55% 5.  Adult onset diabetes mellitus 6.  Hypertension   Plan;  liquid diet as tolerated Diet IV Zofran 4 mg every 6 hours as needed Resume her usual PPI Check lipase this a.m. Repeat labs in a.m. Patient not ready for  discharge today    Principal Problem:   Common bile duct (CBD) obstruction Active Problems:   Essential hypertension     LOS: 1 day   Amy Esterwood PA-C 02/10/2022, 10:43 AM   

## 2022-02-10 NOTE — Progress Notes (Signed)
  Transition of Care [facility]) Screening Note   Patient Details  Name: Julie Graves Date of Birth: Nov 14, 1940   Transition of Care [facility]) CM/SW Contact:    Bartholomew Crews, RN Phone Number: [phone] 02/10/2022, 1:06 PM    Transition of Care Department [facility]) has reviewed patient and no TOC needs have been identified at this time. We will continue to monitor patient advancement through interdisciplinary progression rounds. If new patient transition needs arise, please place a TOC consult.

## 2022-02-11 DIAGNOSIS — K831 Obstruction of bile duct: Secondary | ICD-10-CM | POA: Diagnosis not present

## 2022-02-11 DIAGNOSIS — I10 Essential (primary) hypertension: Secondary | ICD-10-CM | POA: Diagnosis not present

## 2022-02-11 LAB — CBC WITH DIFFERENTIAL/PLATELET
Abs Immature Granulocytes: 0.02 10*3/uL (ref 0.00–0.07)
Basophils Absolute: 0 10*3/uL (ref 0.0–0.1)
Basophils Relative: 1 %
Eosinophils Absolute: 0.2 10*3/uL (ref 0.0–0.5)
Eosinophils Relative: 3 %
HCT: 35.6 % — ABNORMAL LOW (ref 36.0–46.0)
Hemoglobin: 11.9 g/dL — ABNORMAL LOW (ref 12.0–15.0)
Immature Granulocytes: 0 %
Lymphocytes Relative: 20 %
Lymphs Abs: 1.2 10*3/uL (ref 0.7–4.0)
MCH: 32.2 pg (ref 26.0–34.0)
MCHC: 33.4 g/dL (ref 30.0–36.0)
MCV: 96.2 fL (ref 80.0–100.0)
Monocytes Absolute: 0.6 10*3/uL (ref 0.1–1.0)
Monocytes Relative: 10 %
Neutro Abs: 4.1 10*3/uL (ref 1.7–7.7)
Neutrophils Relative %: 66 %
Platelets: 226 10*3/uL (ref 150–400)
RBC: 3.7 MIL/uL — ABNORMAL LOW (ref 3.87–5.11)
RDW: 13.1 % (ref 11.5–15.5)
WBC: 6.2 10*3/uL (ref 4.0–10.5)
nRBC: 0 % (ref 0.0–0.2)

## 2022-02-11 LAB — GLUCOSE, CAPILLARY
Glucose-Capillary: 101 mg/dL — ABNORMAL HIGH (ref 70–99)
Glucose-Capillary: 106 mg/dL — ABNORMAL HIGH (ref 70–99)
Glucose-Capillary: 188 mg/dL — ABNORMAL HIGH (ref 70–99)
Glucose-Capillary: 97 mg/dL (ref 70–99)

## 2022-02-11 LAB — COMPREHENSIVE METABOLIC PANEL
ALT: 65 U/L — ABNORMAL HIGH (ref 0–44)
AST: 37 U/L (ref 15–41)
Albumin: 3 g/dL — ABNORMAL LOW (ref 3.5–5.0)
Alkaline Phosphatase: 92 U/L (ref 38–126)
Anion gap: 8 (ref 5–15)
BUN: 6 mg/dL — ABNORMAL LOW (ref 8–23)
CO2: 25 mmol/L (ref 22–32)
Calcium: 9 mg/dL (ref 8.9–10.3)
Chloride: 104 mmol/L (ref 98–111)
Creatinine, Ser: 0.65 mg/dL (ref 0.44–1.00)
GFR, Estimated: >60 mL/min (ref >60)
Glucose, Bld: 98 mg/dL (ref 70–99)
Potassium: 3.8 mmol/L (ref 3.5–5.1)
Sodium: 137 mmol/L (ref 135–145)
Total Bilirubin: 1 mg/dL (ref 0.3–1.2)
Total Protein: 5.6 g/dL — ABNORMAL LOW (ref 6.5–8.1)

## 2022-02-11 NOTE — Discharge Summary (Signed)
Physician Discharge Summary   Patient: Julie Graves MRN: [id number] DOB: Jul 19, 1940  Admit date:     02/08/2022  Discharge date: 02/11/22  Discharge Physician: Alma Friendly   PCP: Harrison Mons, PA   Recommendations at discharge:   PCP in 1 week GI as needed  Discharge Diagnoses: Principal Problem:   Common bile duct (CBD) obstruction Active Problems:   Essential hypertension    Hospital Course: Julie Graves is a 81 y.o. female with history of CHF, hypertension, diabetes mellitus and prior cholecystectomy 3/22, has been experiencing epigastric abdominal pain with nausea vomiting over the last 5 days which has been gradually worsening. In the ER ultrasound of the abdomen showed CBD dilatation and CT abdomen pelvis shows possible distal CBD stone. LFTs elevated. GI consulted. Patient admitted for CBD obstruction likely from gallstones.   Today, patient denies any new complaints, denies any worsening abdominal pain, nausea/vomiting, fever/chills, chest pain, shortness of breath.  Patient stable to discharge home with follow-up with PCP 1 week, GI as needed  Assessment and Plan:  Choledocholithiasis s/p sphincterotomy and stone extraction via ERCP CBD obstruction likely from gallstones (hx of cholecystectomy 08/2020) Noted elevated LFTs, trending down Currently afebrile with no leukocytosis CT abdomen/pelvis, right upper quadrant liver ultrasound showed choledocholithiasis GI consulted, s/p ERCP Advance diet as tolerated   Hypertension Restart home regimen   History of CHF/CAD Appears euvolemic/stable Currently chest pain-free Last echo showed abdominal on 08/2020 showed EF of 50 to 55%, no regional wall motion abnormality, left ventricular diastolic parameters were multiple   Diabetes mellitus type 2 A1c 5.7 Restart home regimen   Chronic GERD Continue PPI, start sucralfate   Morbid obesity Lifestyle modification advised       Consultants:  GI Procedures performed: ERCP Disposition: Home    Diet recommendation:  Discharge Diet Orders (From admission, onward)     Start     Ordered   02/11/22 0000  Diet - low sodium heart healthy        02/11/22 1301           Cardiac and Carb modified diet DISCHARGE MEDICATION: Allergies as of 02/11/2022       Reactions   Shellfish Allergy Swelling, Other (See Comments)   Swelling - throat and lips   Topiramate    Other reaction(s): Chest Pain   Metamucil [psyllium]    Lisinopril Other (See Comments)   cough   Penicillins Hives, Rash, Other (See Comments)   Has patient had a PCN reaction causing immediate rash, facial/tongue/throat swelling, SOB or lightheadedness with hypotension: Unknown Has patient had a PCN reaction causing severe rash involving mucus membranes or skin necrosis: No Has patient had a PCN reaction that required hospitalization: No Has patient had a PCN reaction occurring within the last 10 years: No If all of the above answers are "NO", then may proceed with Cephalosporin use.   Tramadol Other (See Comments)   confusion        Medication List     STOP taking these medications    potassium chloride SA 20 MEQ tablet Commonly known as: KLOR-CON M       TAKE these medications    acetaminophen 325 MG tablet Commonly known as: TYLENOL Take 2 tablets (650 mg total) by mouth every 6 (six) hours as needed.   alendronate 70 MG tablet Commonly known as: FOSAMAX Take 70 mg by mouth every Monday. Take with a full glass of water on an empty stomach. Take on mondays  aspirin EC 81 MG tablet Take 81 mg by mouth daily. Swallow whole.   CALCIUM 600/VITAMIN D3 PO Take 1 tablet by mouth daily.   carvedilol 6.25 MG tablet Commonly known as: COREG Take 6.25 mg by mouth 2 (two) times daily.   diazepam 2 MG tablet Commonly known as: VALIUM Take 1 mg by mouth daily as needed for anxiety.   HYDROcodone-acetaminophen 5-325 MG tablet Commonly known as:  NORCO/VICODIN Take 1 tablet by mouth every 6 (six) hours as needed for moderate pain.   losartan 100 MG tablet Commonly known as: COZAAR TAKE 1 TABLET(100 MG) BY MOUTH DAILY What changed: See the new instructions.   metFORMIN 500 MG 24 hr tablet Commonly known as: GLUCOPHAGE-XR Take 500 mg by mouth daily.   pantoprazole 40 MG tablet Commonly known as: PROTONIX Take 1 tablet (40 mg total) by mouth 2 (two) times daily before a meal.   simvastatin 40 MG tablet Commonly known as: ZOCOR Take 40 mg by mouth at bedtime.   spironolactone 25 MG tablet Commonly known as: ALDACTONE TAKE 1 TABLET(25 MG) BY MOUTH DAILY What changed: See the new instructions.   sucralfate 1 GM/10ML suspension Commonly known as: Carafate Take 10 mLs (1 g total) by mouth 4 (four) times daily -  with meals and at bedtime.        Follow-up Information     Jacqulynn Cadet [place], [place]. Schedule an appointment as soon as possible for a visit in 1 week(s).   Specialty: Family Medicine Contact information: [address] [place] [address] [phone]                Discharge Exam: Danley Danker Weights   02/08/22 1835 02/11/22 0500  Weight: 93.4 kg 95 kg   General: NAD  Cardiovascular: S1, S2 present Respiratory: CTAB Abdomen: Soft, nontender, nondistended, bowel sounds present Musculoskeletal: No bilateral pedal edema noted Skin: Normal Psychiatry: Normal mood   Condition at discharge: stable  The results of significant diagnostics from this hospitalization (including imaging, microbiology, ancillary and laboratory) are listed below for reference.   Imaging Studies: DG ERCP  Result Date: 02/10/2022 CLINICAL DATA:  Probable choledocholithiasis EXAM: ERCP TECHNIQUE: Multiple spot images obtained with the fluoroscopic device and submitted for interpretation post-procedure. FLUOROSCOPY: Radiation Exposure Index (as provided by the fluoroscopic device): 47.596 mGy Kerma COMPARISON:  CT  abdomen/pelvis 02/09/2022 FINDINGS: Ten intraoperative saved images are submitted for review. The images demonstrate a flexible duodenal scope in the descending duodenum followed by wire cannulation of the common bile duct and cholangiogram. Mild biliary ductal dilatation. Faceted filling defect in the distal common bile duct consistent with choledocholithiasis. Subsequent images demonstrate balloon sphincterotomy and balloon sweeping of the common duct. Evidence of prior cholecystectomy is present with surgical clips and a cystic duct stump. IMPRESSION: 1. Choledocholithiasis. 2. ERCP with balloon sphincterotomy and balloon sweeping of the common duct. These images were submitted for radiologic interpretation only. Please see the procedural report for the amount of contrast and the fluoroscopy time utilized. Electronically Signed   By: Jacqulynn Cadet M.D.   On: 02/10/2022 06:37   CT ABDOMEN PELVIS W CONTRAST  Result Date: 02/09/2022 CLINICAL DATA:  Acute abdominal pain for several days, initial encounter EXAM: CT ABDOMEN AND PELVIS WITH CONTRAST TECHNIQUE: Multidetector CT imaging of the abdomen and pelvis was performed using the standard protocol following bolus administration of intravenous contrast. RADIATION DOSE REDUCTION: This exam was performed according to the departmental dose-optimization program which includes automated exposure control, adjustment  of the mA and/or kV according to patient size and/or use of iterative reconstruction technique. CONTRAST:  175m OMNIPAQUE IOHEXOL 300 MG/ML  SOLN COMPARISON:  Ultrasound from earlier in the same day. FINDINGS: Lower chest: No acute abnormality. Hepatobiliary: Gallbladder has been surgically removed. Biliary ductal dilatation is noted. In the distal common bile duct there is a rounded filling defect consistent with choledocholithiasis/sludge ball. Liver is within normal limits otherwise. Pancreas: Unremarkable. No pancreatic ductal dilatation or  surrounding inflammatory changes. Spleen: Normal in size without focal abnormality. Adrenals/Urinary Tract: Adrenal glands are within normal limits. Kidneys are within normal limits. No obstructive changes are noted. Bladder is partially distended. Stomach/Bowel: Diverticular change of the colon is noted without evidence of diverticulitis. No obstructive or inflammatory changes are seen. The appendix has been surgically removed. No small bowel or gastric abnormality is noted. Vascular/Lymphatic: Aortic atherosclerosis. No enlarged abdominal or pelvic lymph nodes. Reproductive: Status post hysterectomy. No adnexal masses. Other: No abdominal wall hernia or abnormality. No abdominopelvic ascites. Musculoskeletal: Degenerative changes of the lumbar spine are noted. IMPRESSION: Status post cholecystectomy. Common bile duct dilatation is noted with a focal filling defect distally which may represent a small sludge ball or distal gallstone. Nonemergent MRCP may be helpful when the patient's condition improves to allow for optimum imaging. Diverticulosis without diverticulitis. No other focal abnormality is seen. Electronically Signed   By: MInez CatalinaM.D.   On: 02/09/2022 01:03   UKoreaAbdomen Limited RUQ (LIVER/GB)  Result Date: 02/09/2022 CLINICAL DATA:  Right upper quadrant pain EXAM: ULTRASOUND ABDOMEN LIMITED RIGHT UPPER QUADRANT COMPARISON:  Ultrasound 08/23/2020 FINDINGS: Gallbladder: Surgically absent. Negative sonographic Murphy's sign performed by sonographer. Common bile duct: Diameter: 11 mm Liver: No focal lesion identified. Within normal limits in parenchymal echogenicity. Portal vein is patent on color Doppler imaging with normal direction of blood flow towards the liver. Other: None. IMPRESSION: Status post cholecystectomy. The common bile duct measures 11 mm in diameter possibly due to reservoir effect. Electronically Signed   By: TPlacido SouM.D.   On: 02/09/2022 00:44   DG Chest 2  View  Result Date: 02/08/2022 CLINICAL DATA:  Chest pain. EXAM: CHEST - 2 VIEW COMPARISON:  Chest x-ray 08/22/2020 FINDINGS: The heart size and mediastinal contours are within normal limits. Both lungs are clear. The visualized skeletal structures are unremarkable. IMPRESSION: No active cardiopulmonary disease. Electronically Signed   By: ARonney AstersM.D.   On: 02/08/2022 15:12    Microbiology: Results for orders placed or performed during the hospital encounter of 08/22/20  Resp Panel by RT-PCR (Flu A&B, Covid) Nasopharyngeal Swab     Status: None   Collection Time: 08/22/20 11:27 PM   Specimen: Nasopharyngeal Swab; Nasopharyngeal(NP) swabs in vial transport medium  Result Value Ref Range Status   SARS Coronavirus 2 by RT PCR NEGATIVE NEGATIVE Final    Comment: (NOTE) SARS-CoV-2 target nucleic acids are NOT DETECTED.  The SARS-CoV-2 RNA is generally detectable in upper respiratory specimens during the acute phase of infection. The lowest concentration of SARS-CoV-2 viral copies this assay can detect is 138 copies/mL. A negative result does not preclude SARS-Cov-2 infection and should not be used as the sole basis for treatment or other patient management decisions. A negative result may occur with  improper specimen collection/handling, submission of specimen other than nasopharyngeal swab, presence of viral mutation(s) within the areas targeted by this assay, and inadequate number of viral copies(<138 copies/mL). A negative result must be combined with clinical observations, patient history, and  epidemiological information. The expected result is Negative.  Fact Sheet for Patients:  EntrepreneurPulse.com.au  Fact Sheet for Healthcare Providers:  IncredibleEmployment.be  This test is no t yet approved or cleared by the [place] FDA and  has been authorized for detection and/or diagnosis of SARS-CoV-2 by FDA under an Emergency Use  Authorization (EUA). This EUA will remain  in effect (meaning this test can be used) for the duration of the COVID-19 declaration under Section 564(b)(1) of the Act, 21 U.S.C.section 360bbb-3(b)(1), unless the authorization is terminated  or revoked sooner.       Influenza A by PCR NEGATIVE NEGATIVE Final   Influenza B by PCR NEGATIVE NEGATIVE Final    Comment: (NOTE) The Xpert Xpress SARS-CoV-2/FLU/RSV plus assay is intended as an aid in the diagnosis of influenza from Nasopharyngeal swab specimens and should not be used as a sole basis for treatment. Nasal washings and aspirates are unacceptable for Xpert Xpress SARS-CoV-2/FLU/RSV testing.  Fact Sheet for Patients: EntrepreneurPulse.com.au  Fact Sheet for Healthcare Providers: IncredibleEmployment.be  This test is not yet approved or cleared by the [place] FDA and has been authorized for detection and/or diagnosis of SARS-CoV-2 by FDA under an Emergency Use Authorization (EUA). This EUA will remain in effect (meaning this test can be used) for the duration of the COVID-19 declaration under Section 564(b)(1) of the Act, 21 U.S.C. section 360bbb-3(b)(1), unless the authorization is terminated or revoked.  Performed at [facility], [place] [address] [place], [place] [address]     Labs: CBC: Recent Labs  Lab 02/08/22 1421 02/10/22 0102 02/11/22 0155  WBC 5.8 8.0 6.2  NEUTROABS  --  5.9 4.1  HGB 12.5 12.4 11.9*  HCT 39.1 38.0 35.6*  MCV 98.7 96.7 96.2  PLT 246 240 761   Basic Metabolic Panel: Recent Labs  Lab 02/08/22 1421 02/10/22 0102 02/11/22 0155  NA 136 139 137  K 4.2 3.8 3.8  CL 105 106 104  CO2 '24 25 25  '$ GLUCOSE 102* 75 98  BUN 16 10 6*  CREATININE 0.73 0.75 0.65  CALCIUM 9.5 9.4 9.0   Liver Function Tests: Recent Labs  Lab 02/08/22 1933 02/10/22 0102 02/11/22 0155  AST 91* 62* 37  ALT 102* 86* 65*  ALKPHOS 114 105 92  BILITOT 2.5*  1.2 1.0  PROT 6.4* 5.9* 5.6*  ALBUMIN 3.5 3.1* 3.0*   CBG: Recent Labs  Lab 02/10/22 2027 02/11/22 0057 02/11/22 0447 02/11/22 0817 02/11/22 1222  GLUCAP 116* 101* 97 106* 188*    Discharge time spent: greater than 30 minutes.  Signed: Alma Friendly, MD [facility] 02/11/2022

## 2022-02-11 NOTE — Progress Notes (Signed)
Patient ID: Julie Graves, female   DOB: Sep 21, 1940, 81 y.o.   MRN: [id number]    Progress Note   Subjective   Day # 3  CC; acute severe epigastric pain, nausea vomiting  ERCP was done on 02/09/2022 with sphincterotomy and extraction of a 12 mm distal common bile duct stone, no evidence for cholangitis, minimal oozing at the sphincterotomy site stopped on its own.  WBC 6.2/hemoglobin 11.9/hematocrit 35.6 BUN 6/creatinine 0.65 T. bili 1.0/alk phos 92/AST 37/ALT 65 improved Lipase normal yesterday at 24  Smiling, feeling better today, no complaints of abdominal pain no nausea or vomiting, apparently did not have any breakfast and is waiting for lunch.    Objective   Vital signs in last 24 hours: Temp:  [97.9 F (36.6 C)-98.3 F (36.8 C)] 98.3 F (36.8 C) (08/20 0817) Pulse Rate:  [50-61] 50 (08/20 0817) Resp:  [16-18] 17 (08/20 0817) BP: (132-145)/(50-59) 145/55 (08/20 0817) SpO2:  [94 %-98 %] 94 % (08/20 0817) Weight:  [95 kg] 95 kg (08/20 0500) Last BM Date : 02/09/22 General: Elderly African-American female in NAD Heart:  Regular rate and rhythm; no murmurs Lungs: Respirations even and unlabored, lungs CTA bilaterally Abdomen:  Soft, nontender and nondistended. Normal bowel sounds. Extremities:  Without edema. Neurologic:  Alert and oriented,  grossly normal neurologically. Psych:  Cooperative. Normal mood and affect.  Intake/Output from previous day: 08/19 0701 - 08/20 0700 In: -  Out: 1200 [Urine:1200] Intake/Output this shift: Total I/O In: 240 [P.O.:240] Out: -   Lab Results: Recent Labs    02/08/22 1421 02/10/22 0102 02/11/22 0155  WBC 5.8 8.0 6.2  HGB 12.5 12.4 11.9*  HCT 39.1 38.0 35.6*  PLT 246 240 226   BMET Recent Labs    02/08/22 1421 02/10/22 0102 02/11/22 0155  NA 136 139 137  K 4.2 3.8 3.8  CL 105 106 104  CO2 _0 GLUCOSE 102* 75 98  BUN 16 10 6*  CREATININE 0.73 0.75 0.65  CALCIUM 9.5 9.4 9.0   LFT Recent Labs     02/08/22 1933 02/10/22 0102 02/11/22 0155  PROT 6.4*   < > 5.6*  ALBUMIN 3.5   < > 3.0*  AST 91*   < > 37  ALT 102*   < > 65*  ALKPHOS 114   < > 92  BILITOT 2.5*   < > 1.0  BILIDIR 1.2*  --   --   IBILI 1.3*  --   --    < > = values in this interval not displayed.   PT/INR Recent Labs    02/09/22 1845  LABPROT 13.6  INR 1.1    Studies/Results: DG ERCP  Result Date: 02/10/2022 CLINICAL DATA:  Probable choledocholithiasis EXAM: ERCP TECHNIQUE: Multiple spot images obtained with the fluoroscopic device and submitted for interpretation post-procedure. FLUOROSCOPY: Radiation Exposure Index (as provided by the fluoroscopic device): 47.596 mGy Kerma COMPARISON:  CT abdomen/pelvis 02/09/2022 FINDINGS: Ten intraoperative saved images are submitted for review. The images demonstrate a flexible duodenal scope in the descending duodenum followed by wire cannulation of the common bile duct and cholangiogram. Mild biliary ductal dilatation. Faceted filling defect in the distal common bile duct consistent with choledocholithiasis. Subsequent images demonstrate balloon sphincterotomy and balloon sweeping of the common duct. Evidence of prior cholecystectomy is present with surgical clips and a cystic duct stump. IMPRESSION: 1. Choledocholithiasis. 2. ERCP with balloon sphincterotomy and balloon sweeping of the common duct. These images were submitted for radiologic  interpretation only. Please see the procedural report for the amount of contrast and the fluoroscopy time utilized. Electronically Signed   By: Jacqulynn Cadet M.D.   On: 02/10/2022 06:37       Assessment / Plan:    #34 81 year old female, admitted with acute severe epigastric pain nausea and vomiting found secondary to distal common bile duct stone.  She underwent ERCP with stone extraction on 02/09/2022 with no evidence for cholangitis She is status post cholecystectomy March 2022 with gangrenous gallbladder found at that  time.   She did not feel well yesterday some residual abdominal discomfort and nausea.  She is better today, hungry no complaints of abdominal pain or nausea.  LFTs are normalizing, lipase was normal yesterday  Plan; patient is stable for discharge to home this afternoon Continue her usual twice daily PPI She can advance to her regular diet over the next 24 hours  She does not need planned GI follow-up at this time, she is established with Dr. Fuller Plan and will continue her routine follow-up.     Principal Problem:   Common bile duct (CBD) obstruction Active Problems:   Essential hypertension     LOS: 2 days   Julie Scheiber EsterwoodPA-C  02/11/2022, 10:32 AM

## 2022-02-11 NOTE — Progress Notes (Signed)
Mobility Specialist Progress Note:   02/11/22 0900  Mobility  Activity Ambulated with assistance in hallway  Level of Assistance Contact guard assist, steadying assist  Assistive Device Cane  Distance Ambulated (ft) 300 ft  Activity Response Tolerated well  $Mobility charge 1 Mobility   Pt agreeable to mobility session. No physical assistance required. Pt with steady gait throughout. Back in room with all needs met.   Nelta Numbers Acute Rehab Secure Chat or Office Phone: [phone]

## 2022-02-12 ENCOUNTER — Encounter (HOSPITAL_COMMUNITY): Payer: Self-pay | Admitting: Gastroenterology

## 2022-02-13 NOTE — Anesthesia Postprocedure Evaluation (Signed)
Anesthesia Post Note  Patient: Jakeisha F Hawker  Procedure(s) Performed: ENDOSCOPIC RETROGRADE CHOLANGIOPANCREATOGRAPHY (ERCP) REMOVAL OF STONES SPHINCTEROTOMY BILIARY DILATION     Patient location during evaluation: Endoscopy Anesthesia Type: General Level of consciousness: awake and patient cooperative Pain management: pain level controlled Vital Signs Assessment: post-procedure vital signs reviewed and stable Respiratory status: spontaneous breathing, nonlabored ventilation, respiratory function stable and patient connected to nasal cannula oxygen Cardiovascular status: blood pressure returned to baseline and stable Postop Assessment: no apparent nausea or vomiting Anesthetic complications: no   No notable events documented.  Last Vitals:  Vitals:   02/11/22 0426 02/11/22 0817  BP: (!) 132/53 (!) 145/55  Pulse: (!) 58 (!) 50  Resp: 16 17  Temp: 36.6 C 36.8 C  SpO2: 95% 94%    Last Pain:  Vitals:   02/11/22 0817  TempSrc: Oral  PainSc:                  Cigi Bega

## 2022-03-06 ENCOUNTER — Other Ambulatory Visit: Payer: Self-pay | Admitting: Cardiology

## 2022-03-06 DIAGNOSIS — I10 Essential (primary) hypertension: Secondary | ICD-10-CM

## 2022-03-06 DIAGNOSIS — I5042 Chronic combined systolic (congestive) and diastolic (congestive) heart failure: Secondary | ICD-10-CM

## 2022-07-02 ENCOUNTER — Ambulatory Visit: Payer: Medicare Other | Admitting: Cardiology

## 2022-07-02 ENCOUNTER — Encounter: Payer: Self-pay | Admitting: Cardiology

## 2022-07-02 VITALS — BP 130/74 | HR 64 | Ht 60.0 in | Wt 209.0 lb

## 2022-07-02 DIAGNOSIS — I10 Essential (primary) hypertension: Secondary | ICD-10-CM

## 2022-07-02 DIAGNOSIS — I5032 Chronic diastolic (congestive) heart failure: Secondary | ICD-10-CM

## 2022-07-02 DIAGNOSIS — I350 Nonrheumatic aortic (valve) stenosis: Secondary | ICD-10-CM

## 2022-07-02 NOTE — Progress Notes (Signed)
Primary Physician/Referring:  Harrison Mons, PA  Patient ID: Julie Graves, female    DOB: April 23, 1941, 82 y.o.   MRN: [id number]  Chief Complaint  Patient presents with   Congestive Heart Failure   Coronary Artery Disease   Hypertension   HPI:    Julie Graves  is a 82 y.o. female patient with  morbid obesity, nonischemic cardiomyopathy with EF 40% by echo in June 2019 but normalized by medical therapy, insignificant CAD by coronary angiogram on 08/21/17 and revealed mid circumflex 50% and small with 2 distal diagonal 80% stenosis, mild aortic insufficiency and aortic stenosis, diabetes, hypertension, hyperlipidemia, former tobacco use with 15-pack-year history.  This is a 17-monthoffice visit.  No significant change in her weight.  She has not had any chest pain, dyspnea has remained stable.  She does have mild chronic leg edema.  No PND or orthopnea. Recent hospitalization August 2023 for CBD stone SP ERCP noted.  This is 673-monthffice visit.  Overall no change in her functional status.  Past Medical History:  Diagnosis Date   Anginal pain ([facility]   "small pain?nerves in chest"started 10/05/13    [place]inal pain ([facility]   cleared by cardiology 3/15   Anxiety    rx given recent not taken yet   Aortic insufficiency    mild on 9/11 cath   CAD (coronary artery disease)    nonobstructive let heart cath 3/11: 40% ostial D1, 30% mid CFX   CHF (congestive heart failure) ([place]   Colon polyps 02/01/2009    MULTIPLE FRAGMENTS OF TUBULAR ADENOMAS   Diabetes mellitus    Frozen shoulder    GERD (gastroesophageal reflux disease)    occ   History of blood transfusion    pregnancy   HLD (hyperlipidemia)    HTN (hypertension)    ACEI cough   Hx of cardiovascular stress test    Lexiscan Myoview (10/2013):  Fixed ant defect most c/w breast attenuation, cannot exclude apical infarct; no ischemia, EF 46%; Low Risk   Nonischemic cardiomyopathy ([facility]   Ech 3/11 difficult study, moderate  aortic insuficiency noted. Left evntriculogram 3/11   Obese    Osteoarthritis    Palpitation    PACs noted on telemetry while in the hospital   Pneumonia    hx   Stress incontinence    Social History   Tobacco Use   Smoking status: Former    Packs/day: 1.00    Years: 15.00    Total pack years: 15.00    Types: Cigarettes    Quit date: 10/10/1983    Years since quitting: 38.7   Smokeless tobacco: Never   Tobacco comments:    Quit 1970s  Substance Use Topics   Alcohol use: No    Alcohol/week: 0.0 standard drinks of alcohol  Marital Status: Married   ROS  Review of Systems  Cardiovascular:  Positive for dyspnea on exertion (stable), leg swelling (occasional) and palpitations. Negative for chest pain.   Objective  Blood pressure 130/74, pulse 64, height 5' (1.524 m), weight 209 lb (94.8 kg), SpO2 98 %.     07/02/2022   11:25 AM 07/02/2022   11:09 AM 02/11/2022    8:17 AM  Vitals with BMI  Height  '5\' 0"'$    Weight  209 lbs   BMI  4009.62 Systolic [id number] 74 77 55  Pulse  64 50     Physical Exam Constitutional:  Comments: Morbidly obese in no acute distress.  Neck:     Vascular: No carotid bruit or JVD.  Cardiovascular:     Rate and Rhythm: Normal rate and regular rhythm.     Pulses:          Dorsalis pedis pulses are 2+ on the right side and 2+ on the left side.       Posterior tibial pulses are 2+ on the right side and 2+ on the left side.     Heart sounds: Murmur heard.     Early systolic murmur is present with a grade of 1/6 at the upper right sternal border.     No gallop.  Pulmonary:     Effort: Pulmonary effort is normal.     Breath sounds: Normal breath sounds.  Abdominal:     General: Bowel sounds are normal.     Palpations: Abdomen is soft.     Comments: Obese. Pannus present  Musculoskeletal:     Right lower leg: Edema (1-2+ below the pitting edema stable) present.     Left lower leg: Edema (1-2+ below the pitting edema stable)  present.    Laboratory examination:   Recent Labs    02/08/22 1421 02/10/22 0102 02/11/22 0155  NA 136 139 137  K 4.2 3.8 3.8  CL 105 106 104  CO2 '24 25 25  '$ GLUCOSE 102* 75 98  BUN 16 10 6*  CREATININE 0.73 0.75 0.65  CALCIUM 9.5 9.4 9.0  GFRNONAA >60 >60 >60      Latest Ref Rng & Units 02/11/2022    1:55 AM 02/10/2022    1:02 AM 02/08/2022    7:33 PM  CMP  Glucose 70 - 99 mg/dL 98  75    BUN 8 - 23 mg/dL 6  10    Creatinine 0.44 - 1.00 mg/dL 0.65  0.75    Sodium 135 - 145 mmol/L 137  139    Potassium 3.5 - 5.1 mmol/L 3.8  3.8    Chloride 98 - 111 mmol/L 104  106    CO2 22 - 32 mmol/L 25  25    Calcium 8.9 - 10.3 mg/dL 9.0  9.4    Total Protein 6.5 - 8.1 g/dL 5.6  5.9  6.4   Total Bilirubin 0.3 - 1.2 mg/dL 1.0  1.2  2.5   Alkaline Phos 38 - 126 U/L 92  105  114   AST 15 - 41 U/L 37  62  91   ALT 0 - 44 U/L 65  86  102       Latest Ref Rng & Units 02/11/2022    1:55 AM 02/10/2022    1:02 AM 02/08/2022    2:21 PM  CBC  WBC 4.0 - 10.5 K/uL 6.2  8.0  5.8   Hemoglobin 12.0 - 15.0 g/dL 11.9  12.4  12.5   Hematocrit 36.0 - 46.0 % 35.6  38.0  39.1   Platelets 150 - 400 K/uL 226  240  246    glycosylated hemoglobin   External labs:   Labs soon 03/15/2022:  A1c 5.6%.  Hb 12.8/HCT 37.9, platelets: 41, normal indicis.  Serum glucose 98 mg, BUN 18, creatinine 0.70, EGFR 87 mL, LFTs normal.  Total cholesterol 143, triglycerides 135, HDL 63, LDL 57. Medications and allergies   Allergies  Allergen Reactions   Iodine Hives, Swelling and Other (See Comments)   Other Swelling and Other (See Comments)    Shellfish- swelling throat and  lips   Shellfish Allergy Swelling and Other (See Comments)    Swelling - throat and lips   Topiramate     Other reaction(s): Chest Pain   Losartan Hives and Other (See Comments)   Psyllium Other (See Comments)   Lisinopril Other (See Comments)    cough   Penicillins Hives, Rash and Other (See Comments)    Has patient had a PCN  reaction causing immediate rash, facial/tongue/throat swelling, SOB or lightheadedness with hypotension: Unknown Has patient had a PCN reaction causing severe rash involving mucus membranes or skin necrosis: No Has patient had a PCN reaction that required hospitalization: No Has patient had a PCN reaction occurring within the last 10 years: No If all of the above answers are "NO", then may proceed with Cephalosporin use.    Tramadol Other (See Comments)    confusion    Current Outpatient Medications:    acetaminophen (TYLENOL) 325 MG tablet, Take 2 tablets (650 mg total) by mouth every 6 (six) hours as needed., Disp: , Rfl:    alendronate (FOSAMAX) 70 MG tablet, Take 70 mg by mouth every Monday. Take with a full glass of water on an empty stomach. Take on mondays, Disp: , Rfl:    aspirin EC 81 MG tablet, Take 81 mg by mouth daily. Swallow whole., Disp: , Rfl:    Calcium Carb-Cholecalciferol (CALCIUM 600/VITAMIN D3 PO), Take 1 tablet by mouth daily., Disp: , Rfl:    carvedilol (COREG) 6.25 MG tablet, Take 6.25 mg by mouth 2 (two) times daily., Disp: , Rfl:    diazepam (VALIUM) 2 MG tablet, Take 1 mg by mouth daily as needed for anxiety., Disp: , Rfl:    HYDROcodone-acetaminophen (NORCO/VICODIN) 5-325 MG tablet, Take 1 tablet by mouth every 6 (six) hours as needed for moderate pain., Disp: , Rfl:    losartan (COZAAR) 100 MG tablet, TAKE 1 TABLET(100 MG) BY MOUTH DAILY, Disp: 90 tablet, Rfl: 3   metFORMIN (GLUCOPHAGE-XR) 500 MG 24 hr tablet, Take 500 mg by mouth daily., Disp: , Rfl:    pantoprazole (PROTONIX) 40 MG tablet, Take 1 tablet (40 mg total) by mouth 2 (two) times daily before a meal., Disp: 90 tablet, Rfl: 7   simvastatin (ZOCOR) 40 MG tablet, Take 40 mg by mouth at bedtime., Disp: , Rfl:    spironolactone (ALDACTONE) 25 MG tablet, TAKE 1 TABLET(25 MG) BY MOUTH DAILY (Patient taking differently: Take 25 mg by mouth daily.), Disp: 90 tablet, Rfl: 2    Radiology:   No results  found.  Cardiac Studies:   Stress nuclear study 11/05/2013:  Exercise Capacity:  Lexiscan with no exercise. BP Response:  Normal blood pressure response. Clinical Symptoms:  There is dyspnea. ECG Impression:  No significant ECG changes with Lexiscan. Comparison with Prior Nuclear Study: No images to compare to, however the clinical repeat appears to be similar. Overall Impression:  Low risk stress nuclear study With mild apical breast attenuation. Cannot exclude small apical infarct. LV Wall Motion:  Unable to adequately it says the overall cardiac function as the gated images were not adequately established to capture wall motion.  Coronary angiogram 07/2017:  Mid circumflex 50%, D1 ostial 50% and mid 80%, very small. Mild noncritical CAD other vessels. EF 45%.  Echocardiogram 08/23/2020:     1. Left ventricular ejection fraction, by estimation, is 50 to 55%. The left ventricle has low normal function. The left ventricle has no regional wall motion abnormalities. There is mild left ventricular hypertrophy. Left ventricular  diastolic parameters were normal.  2. Right ventricular systolic function is normal. The right ventricular size is normal.  3. Left atrial size was mildly dilated.  4. The mitral valve is normal in structure. Trivial mitral valve regurgitation. No evidence of mitral stenosis.  5. The aortic valve was not well visualized. Aortic valve regurgitation is mild. Mild aortic valve stenosis.  6. The inferior vena cava is normal in size with greater than 50% respiratory variability, suggesting right atrial pressure of 3 mmHg.  EKG  EKG 07/02/2022: Normal sinus rhythm at rate of 57 bpm, left axis deviation, left anterior fascicular block.  Poor R progression, cannot exclude anteroseptal infarct old.  IVCD, LVH.  Compared to 06/30/2021, no significant change.   Assessment     ICD-10-CM   1. Chronic diastolic heart failure (HCC)  I50.32 EKG 12-Lead    2. Essential hypertension   I10     3. Mild aortic stenosis  I35.0     4. Class 3 severe obesity due to excess calories with serious comorbidity and body mass index (BMI) of 40.0 to 44.9 in adult (HCC)  E66.01    Z68.41       No orders of the defined types were placed in this encounter.    Medications Discontinued During This Encounter  Medication Reason   sucralfate (CARAFATE) 1 GM/10ML suspension Completed Course     Discontinued by PCP due to recurrent UTI.  Recommendations:   Julie Graves  is a 82 y.o.female patient with  morbid obesity, nonischemic cardiomyopathy with EF 40% by echo in June 2019 but normalized by medical therapy, insignificant CAD by coronary angiogram on 08/21/17 and revealed mid circumflex 50% and small with 2 distal diagonal 80% stenosis, mild aortic insufficiency and aortic stenosis, diabetes, hypertension, hyperlipidemia, former tobacco use with 15-pack-year history.  1. Chronic diastolic heart failure ([place]) Patient is presently doing well, no clinical evidence of heart failure.  Her weight has remained stable.  Recent hospitalization August 2023 for CBD stone SP ERCP noted.  Overall stable from cardiac standpoint.  2. Essential hypertension Blood pressure is under excellent control with present medical regimen, no changes were done today.  Renal function is normal.  3. Mild aortic stenosis No change in aortic stenotic murmur, she has a very soft 1/6 to 2/6 systolic ejection murmur in the right sternal border.  Last echocardiogram was about a year ago, do not think she needs repeat echocardiogram.  Will continue to monitor this clinically.  4. Class 3 severe obesity due to excess calories with serious comorbidity and body mass index (BMI) of 40.0 to 44.9 in adult [facility]) Obesity continues to be a major issue, however she has maintained her weight, in spite of the holidays.  Office visit in a year or sooner if problems.    Adrian Prows, MD, [facility] 07/02/2022, 11:26 AM Office:  [phone] Pager: [phone]

## 2022-08-20 ENCOUNTER — Other Ambulatory Visit: Payer: Self-pay | Admitting: Cardiology

## 2022-08-20 DIAGNOSIS — I10 Essential (primary) hypertension: Secondary | ICD-10-CM

## 2022-08-20 DIAGNOSIS — I5032 Chronic diastolic (congestive) heart failure: Secondary | ICD-10-CM

## 2022-12-17 ENCOUNTER — Other Ambulatory Visit: Payer: Self-pay | Admitting: Cardiology

## 2022-12-17 DIAGNOSIS — I5042 Chronic combined systolic (congestive) and diastolic (congestive) heart failure: Secondary | ICD-10-CM

## 2022-12-17 DIAGNOSIS — I10 Essential (primary) hypertension: Secondary | ICD-10-CM

## 2022-12-18 ENCOUNTER — Ambulatory Visit: Payer: Medicare Other | Admitting: Orthopaedic Surgery

## 2023-01-15 ENCOUNTER — Ambulatory Visit: Payer: Medicare Other | Admitting: Orthopaedic Surgery

## 2023-01-15 ENCOUNTER — Other Ambulatory Visit (INDEPENDENT_AMBULATORY_CARE_PROVIDER_SITE_OTHER): Payer: Medicare Other

## 2023-01-15 VITALS — BP 112/55 | HR 66 | Ht 60.0 in | Wt 203.0 lb

## 2023-01-15 DIAGNOSIS — G8929 Other chronic pain: Secondary | ICD-10-CM

## 2023-01-15 DIAGNOSIS — M6281 Muscle weakness (generalized): Secondary | ICD-10-CM | POA: Diagnosis not present

## 2023-01-15 DIAGNOSIS — M25552 Pain in left hip: Secondary | ICD-10-CM

## 2023-01-15 DIAGNOSIS — M545 Low back pain, unspecified: Secondary | ICD-10-CM | POA: Diagnosis not present

## 2023-01-15 NOTE — Progress Notes (Signed)
Office Visit Note   Patient: Julie Graves           Date of Birth: 01-05-1941           MRN: [id number] Visit Date: 01/15/2023              Requested by: Porfirio Oar, PA [address] [place],  [place] [address] PCP: Porfirio Oar, PA   Assessment & Plan: Visit Diagnoses:  1. Pain in left hip   2. Chronic bilateral low back pain, unspecified whether sciatica present   3. Quadriceps weakness     Plan: Patient is in physical therapy she has significant quad weakness much worse on the left than right.  She can follow-up with me in a few months.  Follow-Up Instructions: No follow-ups on file.   Orders:  Orders Placed This Encounter  Procedures   XR HIP UNILAT W OR W/O PELVIS 2-3 VIEWS LEFT   XR Lumbar Spine 2-3 Views   Ambulatory referral to Physical Therapy   No orders of the defined types were placed in this encounter.     Procedures: No procedures performed   Clinical Data: No additional findings.   Subjective: Chief Complaint  Patient presents with   Lower Back - Pain   Left Hip - Pain    HPI 82 year old female post right total knee arthroplasty 2015 and left total knee arthroplasty 2011.  Patient had pain across her lower back and has been ambulating with a forward flexed position.  She can go up on a single step without hopping and significant pushing up with her arms.  She has had to use a cane and states she has weakness left leg.  Increased pain in the anterior thigh at the end of the day.  She is use Tylenol.  Lumbar MRI showed some facet arthrosis without compression 2015.  No neurogenic claudication symptoms.  Long history of morbid obesity she is gradually lost weight and now BMI is under 40 for the first time in 10 years.  Review of Systems 14 point system update otherwise unchanged from previous visit.   Objective: Vital Signs: BP (!) 112/55   Pulse 66   Ht 5' (1.524 m)   Wt 203 lb (92.1 kg)   BMI 39.65 kg/m    Physical Exam Constitutional:      Appearance: She is well-developed.  HENT:     Head: Normocephalic.     Right Ear: External ear normal.     Left Ear: External ear normal. There is no impacted cerumen.  Eyes:     Pupils: Pupils are equal, round, and reactive to light.  Neck:     Thyroid: No thyromegaly.     Trachea: No tracheal deviation.  Cardiovascular:     Rate and Rhythm: Normal rate.  Pulmonary:     Effort: Pulmonary effort is normal.  Abdominal:     Palpations: Abdomen is soft.  Musculoskeletal:     Cervical back: No rigidity.  Skin:    General: Skin is warm and dry.  Neurological:     Mental Status: She is alert and oriented to person, place, and time.  Psychiatric:        Behavior: Behavior normal.     Ortho Exam patient has bilateral quad weakness worse on the left than right.  She can go up on a single step with either leg without popping and significant pressure either forearms or pressure on her hands.  I can overcome quads  with 2 fingers on the right and 1 finger on the left in the sitting position.  Specialty Comments:  No specialty comments available.  Imaging: AP pelvis frog-leg left hip demonstrates some joint space narrowing with small marginal osteophytes left hip more than right hip.  Impression: Mild left hip osteoarthritis.  P and lateral lumbar spine images are obtained and reviewed.  Significant to space narrowing L3-4 with facet arthropathy.  1 mm anterolisthesis L4-5.  1 cm kidney stones noted right side.  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 more than L4-5 PMFS History: Patient Active Problem List   Diagnosis Date Noted   Common bile duct (CBD) obstruction 02/09/2022   Acute on chronic diastolic CHF (congestive heart failure) (HCC) 08/24/2020   Acute cholecystitis 08/23/2020   Elevated troponin 08/23/2020   Arthritis of left knee 05/13/2020   S/P total knee arthroplasty, left 05/13/2020   History of total knee  arthroplasty, right 03/31/2020   Precordial pain    Chest pain 08/13/2017   Mixed dyslipidemia 08/13/2017   CAD (coronary artery disease), native coronary artery 08/13/2017   Mild episode of recurrent major depressive disorder (HCC) 11/03/2013   Urge incontinence 04/02/2013   Spinal stenosis, lumbar region, without neurogenic claudication 10/27/2012   Spinal stenosis of lumbar region 10/01/2012   Allergic rhinitis 09/26/2011   Dizziness 01/23/2011   Type II diabetes mellitus (HCC) 06/27/2010   Morbid obesity (HCC) 06/27/2010   Impaired glucose tolerance 06/27/2010   Disorder of bone and cartilage 06/22/2010   HYPERLIPIDEMIA-MIXED 03/07/2010   Essential hypertension 03/07/2010   CARDIOMEGALY 11/30/2009   OTHER DYSPNEA AND RESPIRATORY ABNORMALITIES 11/30/2009   AORTIC REGURGITATION 09/28/2009   Chronic diastolic heart failure (HCC) 09/28/2009   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   Osteoarthrosis involving multiple sites 10/26/2008   Past Medical History:  Diagnosis Date   Anginal pain (HCC)    "small pain?nerves in chest"started 10/05/13    Anginal pain (HCC)    cleared by cardiology 3/15   Anxiety    rx given recent not taken yet   Aortic insufficiency    mild on 9/11 cath   CAD (coronary artery disease)    nonobstructive let heart cath 3/11: 40% ostial D1, 30% mid CFX   CHF (congestive heart failure) (HCC)    Colon polyps 02/01/2009    MULTIPLE FRAGMENTS OF TUBULAR ADENOMAS   Diabetes mellitus    Frozen shoulder    GERD (gastroesophageal reflux disease)    occ   History of blood transfusion    pregnancy   HLD (hyperlipidemia)    HTN (hypertension)    ACEI cough   Hx of cardiovascular stress test    Lexiscan Myoview (10/2013):  Fixed ant defect most c/w breast attenuation, cannot exclude apical infarct; no ischemia, EF 46%; Low Risk   Nonischemic cardiomyopathy (HCC)    Ech 3/11 difficult study, moderate aortic insuficiency noted. Left evntriculogram 3/11   Obese     Osteoarthritis    Palpitation    PACs noted on telemetry while in the hospital   Pneumonia    hx   Stress incontinence     Family History  Problem Relation Age of Onset   Stroke Mother 42   Heart attack Father    Heart attack Sister    Heart disease Sister    Diabetes Sister    Thyroid cancer Sister    Diabetes Brother    Colon cancer Brother    Breast cancer Neg Hx    Esophageal cancer Neg Hx  Pancreatic cancer Neg Hx    Stomach cancer Neg Hx     Past Surgical History:  Procedure Laterality Date   ABDOMINAL HYSTERECTOMY     APPENDECTOMY     BILIARY DILATION  02/09/2022   Procedure: BILIARY DILATION;  Surgeon: Lynann Bologna, MD;  Location: [facility] ENDOSCOPY;  Service: Gastroenterology;;   BLADDER NECK SUSPENSION  1985   CARDIAC CATHETERIZATION     CHOLECYSTECTOMY N/A 08/24/2020   Procedure: LAPAROSCOPIC CHOLECYSTECTOMY;  Surgeon: Harriette Bouillon, MD;  Location: [facility] ORS;  Service: General;  Laterality: N/A;   ERCP N/A 02/09/2022   Procedure: ENDOSCOPIC RETROGRADE CHOLANGIOPANCREATOGRAPHY (ERCP);  Surgeon: Lynann Bologna, MD;  Location: [facility] ENDOSCOPY;  Service: Gastroenterology;  Laterality: N/A;   EYE SURGERY     gall stones     2023   HAND SURGERY     right "had knots cut out"   KNEE ARTHROPLASTY Right 12/09/2013   Procedure: COMPUTER ASSISTED Right TOTAL KNEE ARTHROPLASTY;  Surgeon: Eldred Manges, MD;  Location: [facility] OR;  Service: Orthopedics;  Laterality: Right;   LEFT HEART CATH AND CORONARY ANGIOGRAPHY N/A 08/15/2017   Procedure: LEFT HEART CATH AND CORONARY ANGIOGRAPHY;  Surgeon: Corky Crafts, MD;  Location: [facility] INVASIVE CV LAB;  Service: Cardiovascular;  Laterality: N/A;   REMOVAL OF STONES  02/09/2022   Procedure: REMOVAL OF STONES;  Surgeon: Lynann Bologna, MD;  Location: [facility] ENDOSCOPY;  Service: Gastroenterology;;   Dennison Mascot  02/09/2022   Procedure: Dennison Mascot;  Surgeon: Lynann Bologna, MD;  Location[facility] ENDOSCOPY;  Service: Gastroenterology;;   TOTAL KNEE  ARTHROPLASTY Left 05/13/2020   Procedure: LEFT TOTAL KNEE ARTHROPLASTY;  Surgeon: Eldred Manges, MD;  Location: [facility] OR;  Service: Orthopedics;  Laterality: Left;   TUBAL LIGATION     Social History   Occupational History   Not on file  Tobacco Use   Smoking status: Former    Current packs/day: 0.00    Average packs/day: 1 pack/day for 15.0 years (15.0 ttl pk-yrs)    Types: Cigarettes    Start date: 10/09/1968    Quit date: 10/10/1983    Years since quitting: 39.3   Smokeless tobacco: Never   Tobacco comments:    Quit 1970s  Vaping Use   Vaping status: Never Used  Substance and Sexual Activity   Alcohol use: No    Alcohol/week: 0.0 standard drinks of alcohol   Drug use: No   Sexual activity: Not on file

## 2023-01-28 ENCOUNTER — Ambulatory Visit: Payer: Medicare Other | Admitting: Physical Therapy

## 2023-01-28 ENCOUNTER — Encounter: Payer: Self-pay | Admitting: Physical Therapy

## 2023-01-28 ENCOUNTER — Other Ambulatory Visit: Payer: Self-pay

## 2023-01-28 DIAGNOSIS — M6281 Muscle weakness (generalized): Secondary | ICD-10-CM

## 2023-01-28 DIAGNOSIS — R2681 Unsteadiness on feet: Secondary | ICD-10-CM

## 2023-01-28 DIAGNOSIS — M25662 Stiffness of left knee, not elsewhere classified: Secondary | ICD-10-CM | POA: Diagnosis not present

## 2023-01-28 DIAGNOSIS — M25661 Stiffness of right knee, not elsewhere classified: Secondary | ICD-10-CM | POA: Diagnosis not present

## 2023-01-28 DIAGNOSIS — R262 Difficulty in walking, not elsewhere classified: Secondary | ICD-10-CM

## 2023-01-28 NOTE — Therapy (Signed)
OUTPATIENT PHYSICAL THERAPY LOWER EXTREMITY EVALUATION   Patient Name: Julie Graves MRN: [id number] DOB:April 16, 1941, 82 y.o., female Today's Date: 01/28/2023  END OF SESSION:  PT End of Session - 01/28/23 1034     Visit Number 1    Number of Visits 13    Date for PT Re-Evaluation 03/11/23    Authorization Time Period 01/28/23 to 03/11/23    PT Start Time 1033   pt LATE   PT Stop Time 1100    PT Time Calculation (min) 27 min    Activity Tolerance Patient tolerated treatment well    Behavior During Therapy [facility] for tasks assessed/performed             Past Medical History:  Diagnosis Date   Anginal pain (HCC)    "small pain?nerves in chest"started 10/05/13    Anginal pain (HCC)    cleared by cardiology 3/15   Anxiety    rx given recent not taken yet   Aortic insufficiency    mild on 9/11 cath   CAD (coronary artery disease)    nonobstructive let heart cath 3/11: 40% ostial D1, 30% mid CFX   CHF (congestive heart failure) (HCC)    Colon polyps 02/01/2009    MULTIPLE FRAGMENTS OF TUBULAR ADENOMAS   Diabetes mellitus    Frozen shoulder    GERD (gastroesophageal reflux disease)    occ   History of blood transfusion    pregnancy   HLD (hyperlipidemia)    HTN (hypertension)    ACEI cough   Hx of cardiovascular stress test    Lexiscan Myoview (10/2013):  Fixed ant defect most c/w breast attenuation, cannot exclude apical infarct; no ischemia, EF 46%; Low Risk   Nonischemic cardiomyopathy (HCC)    Ech 3/11 difficult study, moderate aortic insuficiency noted. Left evntriculogram 3/11   Obese    Osteoarthritis    Palpitation    PACs noted on telemetry while in the hospital   Pneumonia    hx   Stress incontinence    Past Surgical History:  Procedure Laterality Date   ABDOMINAL HYSTERECTOMY     APPENDECTOMY     BILIARY DILATION  02/09/2022   Procedure: BILIARY DILATION;  Surgeon: Lynann Bologna, MD;  Location: [facility] ENDOSCOPY;  Service: Gastroenterology;;   BLADDER  NECK SUSPENSION  1985   CARDIAC CATHETERIZATION     CHOLECYSTECTOMY N/A 08/24/2020   Procedure: LAPAROSCOPIC CHOLECYSTECTOMY;  Surgeon: Harriette Bouillon, MD;  Location: [facility] ORS;  Service: General;  Laterality: N/A;   ERCP N/A 02/09/2022   Procedure: ENDOSCOPIC RETROGRADE CHOLANGIOPANCREATOGRAPHY (ERCP);  Surgeon: Lynann Bologna, MD;  Location: [facility] ENDOSCOPY;  Service: Gastroenterology;  Laterality: N/A;   EYE SURGERY     gall stones     2023   HAND SURGERY     right "had knots cut out"   KNEE ARTHROPLASTY Right 12/09/2013   Procedure: COMPUTER ASSISTED Right TOTAL KNEE ARTHROPLASTY;  Surgeon: Eldred Manges, MD;  Location: [facility] OR;  Service: Orthopedics;  Laterality: Right;   LEFT HEART CATH AND CORONARY ANGIOGRAPHY N/A 08/15/2017   Procedure: LEFT HEART CATH AND CORONARY ANGIOGRAPHY;  Surgeon: Corky Crafts, MD;  Location: [facility] INVASIVE CV LAB;  Service: Cardiovascular;  Laterality: N/A;   REMOVAL OF STONES  02/09/2022   Procedure: REMOVAL OF STONES;  Surgeon: Lynann Bologna, MD;  Location: [facility] ENDOSCOPY;  Service: Gastroenterology;;   Dennison Mascot  02/09/2022   Procedure: Dennison Mascot;  Surgeon: Lynann Bologna, MD;  Location: [facility] ENDOSCOPY;  Service: Gastroenterology;;   TOTAL  KNEE ARTHROPLASTY Left 05/13/2020   Procedure: LEFT TOTAL KNEE ARTHROPLASTY;  Surgeon: Eldred Manges, MD;  Location: [facility] OR;  Service: Orthopedics;  Laterality: Left;   TUBAL LIGATION     Patient Active Problem List   Diagnosis Date Noted   Common bile duct (CBD) obstruction 02/09/2022   Acute on chronic diastolic CHF (congestive heart failure) (HCC) 08/24/2020   Acute cholecystitis 08/23/2020   Elevated troponin 08/23/2020   Arthritis of left knee 05/13/2020   S/P total knee arthroplasty, left 05/13/2020   History of total knee arthroplasty, right 03/31/2020   Precordial pain    Chest pain 08/13/2017   Mixed dyslipidemia 08/13/2017   CAD (coronary artery disease), native coronary artery 08/13/2017   Mild  episode of recurrent major depressive disorder (HCC) 11/03/2013   Urge incontinence 04/02/2013   Spinal stenosis, lumbar region, without neurogenic claudication 10/27/2012   Spinal stenosis of lumbar region 10/01/2012   Allergic rhinitis 09/26/2011   Dizziness 01/23/2011   Type II diabetes mellitus (HCC) 06/27/2010   Morbid obesity (HCC) 06/27/2010   Impaired glucose tolerance 06/27/2010   Disorder of bone and cartilage 06/22/2010   HYPERLIPIDEMIA-MIXED 03/07/2010   Essential hypertension 03/07/2010   CARDIOMEGALY 11/30/2009   OTHER DYSPNEA AND RESPIRATORY ABNORMALITIES 11/30/2009   AORTIC REGURGITATION 09/28/2009   Chronic diastolic heart failure (HCC) 09/28/2009   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   Osteoarthrosis involving multiple sites 10/26/2008    PCP: Porfirio Oar PA   REFERRING PROVIDER: Eldred Manges, MD  REFERRING DIAG:  Diagnosis  M62.81 (ICD-10-CM) - Quadriceps weakness    THERAPY DIAG:  Muscle weakness (generalized)  Difficulty in walking, not elsewhere classified  Stiffness of left knee, not elsewhere classified  Stiffness of right knee, not elsewhere classified  Unsteadiness on feet  Rationale for Evaluation and Treatment: Rehabilitation  ONSET DATE: chronic   SUBJECTIVE:   SUBJECTIVE STATEMENT:  I had a knee replacement, I'm still using a cane. This was in 21 or 22 I think. They had to do emergency surgery I think due to gallstones, I had a to take a break from PT then went back to it. I still can't walk far without my cane. Doctor said that my muscle was weak. Having pain in my back. My back hurts especially when I stand up and sit down.  PERTINENT HISTORY: Anginal pain, anxiety, aortic insufficiency, CAD, CHF, DM, hx frozen shoulder, HLD, HTN, cardiomyopathy, obesity, OA, cardiac cath, B TKAs, hand surgery PAIN:  Are you having pain? No At worst 10/10 it makes me physically cry   PRECAUTIONS: None    WEIGHT BEARING RESTRICTIONS:  No  FALLS:  Has patient fallen in last 6 months? No  LIVING ENVIRONMENT: Lives with: lives with their spouse Lives in: House/apartment Stairs: has upstairs and downstairs in the house but can stay on first floor  Has following equipment at home: Single point cane, Walker - 2 wheeled, and bed side commode  OCCUPATION: retired   PLOF: Independent, Independent with basic ADLs, and Independent with household mobility with device  PATIENT GOALS: get away from cane, get stronger, address back pain   NEXT MD VISIT: Dr. Ophelia Charter 04/16/23  OBJECTIVE:   DIAGNOSTIC FINDINGS: AP pelvis frog-leg left hip demonstrates some joint space narrowing with  small marginal osteophytes left hip more than right hip.   Impression: Mild left hip osteoarthritis.  AP and lateral lumbar spine images are obtained and reviewed.  Significant  to space narrowing L3-4 with facet arthropathy.  1 mm anterolisthesis  L4-5.  1 cm kidney stones noted right side.   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 more than L4-5.  PATIENT SURVEYS:  FOTO next visit pt late  COGNITION: Overall cognitive status: Within functional limits for tasks assessed     SENSATION: Not tested      PALPATION:  R lumbar paraspinals tight and tender  LOWER EXTREMITY ROM:  Active ROM  Seated at edge of mat table  Right eval Left eval  Hip flexion    Hip extension    Hip abduction    Hip adduction    Hip internal rotation    Hip external rotation    Knee flexion 92* 88*  Knee extension -3* 2*  Ankle dorsiflexion    Ankle plantarflexion    Ankle inversion    Ankle eversion     (Blank rows = not tested)  LOWER EXTREMITY MMT:  MMT Right eval Left eval  Hip flexion 2+ 2+  Hip extension    Hip abduction    Hip adduction    Hip internal rotation    Hip external rotation    Knee flexion 4+ 3+  Knee extension 4+ 3  Ankle dorsiflexion 5 3   Ankle plantarflexion    Ankle inversion    Ankle eversion      (Blank rows = not tested)    FUNCTIONAL TESTS:  Timed up and go (TUG): next visit   GAIT: Distance walked: in clinic distances  Assistive device utilized: Single point cane Level of assistance: Modified independence Comments: short step lengths/stance times, tends to shuffle a bit, generally unsteady    TODAY'S TREATMENT:                                                                                                                              DATE:   Eval  Objective measures, appropriate education, care planning    TherEx  LAQs red TB x5 B HS curls red TB x5 B Seated hip ABD red TB x5 Seated marches no resistance x5 B     PATIENT EDUCATION:  Education details: exam findings, POC, HEP  Person educated: Patient Education method: Programmer, multimedia, Facilities manager, and Handouts Education comprehension: verbalized understanding, returned demonstration, and needs further education  HOME EXERCISE PROGRAM: Access Code: [email or web address] URL: [email or web address] Date: 01/28/2023 Prepared by: Nedra Hai  Exercises - Sitting Knee Extension with Resistance  - 1-2 x daily - 7 x weekly - 1 sets - 10 reps - 2 seconds  hold - Seated Hamstring Curl with Anchored Resistance  - 1-2 x daily - 7 x weekly - 1 sets - 10 reps - 2 seconds  hold - Seated Hip Abduction with Resistance  - 1-2 x daily - 7 x weekly - 1 sets - 10 reps - 2 seconds  hold - Seated March  - 1-2 x daily - 7 x weekly - 1 sets - 10 reps - 1 second  hold  ASSESSMENT:  CLINICAL IMPRESSION:  Patient is a 82 y.o. F who was seen today for physical therapy evaluation and treatment for quad weakness, she is also concerned about some back pain. She arrived quite late today and was very talkative, evaluation was extremely limited unfortunately. See above for objective measures taken today. Will trial PT to address her concerns and assist in reaching optimal level of function.    OBJECTIVE IMPAIRMENTS: Abnormal gait,  decreased activity tolerance, decreased balance, decreased coordination, decreased endurance, decreased mobility, difficulty walking, decreased ROM, decreased strength, impaired flexibility, impaired UE functional use, obesity, and pain.   ACTIVITY LIMITATIONS: standing, squatting, stairs, transfers, and locomotion level  PARTICIPATION LIMITATIONS: driving, shopping, community activity, and church  PERSONAL FACTORS: Age, Behavior pattern, Education, Fitness, Past/current experiences, Social background, and Time since onset of injury/illness/exacerbation are also affecting patient's functional outcome.   REHAB POTENTIAL: Fair chronicity of impairments  CLINICAL DECISION MAKING: Stable/uncomplicated  EVALUATION COMPLEXITY: Low   GOALS: Goals reviewed with patient? Yes  SHORT TERM GOALS: Target date: 02/18/2023   Will be compliant with appropriate progressive HEP  Baseline: Goal status: INITIAL  2.  Back pain to have improved by 50%  Baseline:  Goal status: INITIAL  3.  Will be able to ambulate household distances with improved stance/step times/lengths and no unsteadiness Baseline:  Goal status: INITIAL    LONG TERM GOALS: Target date: 03/11/2023    MMT to improve by 1 grade all weak groups  Baseline:  Goal status: INITIAL  2.  Will complete TUG in 15 seconds or less no device  Baseline:  Goal status: INITIAL  3.  Back pain to have improved by 75%  Baseline:  Goal status: INITIAL  4.  Will be able to ambulate at least household distances no device no unsteadiness  Baseline:  Goal status: INITIAL  5.  Knee flexion AROM to improve by 10 degrees B  Baseline:  Goal status: INITIAL     PLAN:  PT FREQUENCY: 2x/week  PT DURATION: 6 weeks  PLANNED INTERVENTIONS: Therapeutic exercises, Therapeutic activity, Neuromuscular re-education, Balance training, Gait training, Patient/Family education, Self Care, Joint mobilization, Stair training, DME instructions, Aquatic  Therapy, Dry Needling, Electrical stimulation, Spinal mobilization, Cryotherapy, Moist heat, Taping, Ultrasound, Ionotophoresis 4mg /ml Dexamethasone, Manual therapy, and Re-evaluation  PLAN FOR NEXT SESSION: FOTO, check leg length difference and place shoe lift if appropriate, needs TUG; otherwise strength, balance, gait training, knee ROM   Nedra Hai, PT, DPT 01/28/23 11:22 AM

## 2023-02-01 ENCOUNTER — Encounter: Payer: Self-pay | Admitting: Rehabilitative and Restorative Service Providers"

## 2023-02-01 ENCOUNTER — Ambulatory Visit: Payer: Medicare Other | Admitting: Rehabilitative and Restorative Service Providers"

## 2023-02-01 DIAGNOSIS — R2681 Unsteadiness on feet: Secondary | ICD-10-CM

## 2023-02-01 DIAGNOSIS — M25662 Stiffness of left knee, not elsewhere classified: Secondary | ICD-10-CM

## 2023-02-01 DIAGNOSIS — M6281 Muscle weakness (generalized): Secondary | ICD-10-CM | POA: Diagnosis not present

## 2023-02-01 DIAGNOSIS — R262 Difficulty in walking, not elsewhere classified: Secondary | ICD-10-CM

## 2023-02-01 DIAGNOSIS — M25661 Stiffness of right knee, not elsewhere classified: Secondary | ICD-10-CM | POA: Diagnosis not present

## 2023-02-01 NOTE — Therapy (Signed)
OUTPATIENT PHYSICAL THERAPY TREATMENT   Patient Name: Julie Graves MRN: [id number] DOB:September 19, 1940, 82 y.o., female Today's Date: 02/01/2023  END OF SESSION:  PT End of Session - 02/01/23 1102     Visit Number 2    Number of Visits 13    Date for PT Re-Evaluation 03/11/23    Authorization Time Period 01/28/23 to 03/11/23    PT Start Time 1115    PT Stop Time 1140    PT Time Calculation (min) 25 min    Activity Tolerance Patient tolerated treatment well    Behavior During Therapy [facility] for tasks assessed/performed              Past Medical History:  Diagnosis Date   Anginal pain (HCC)    "small pain?nerves in chest"started 10/05/13    Anginal pain (HCC)    cleared by cardiology 3/15   Anxiety    rx given recent not taken yet   Aortic insufficiency    mild on 9/11 cath   CAD (coronary artery disease)    nonobstructive let heart cath 3/11: 40% ostial D1, 30% mid CFX   CHF (congestive heart failure) (HCC)    Colon polyps 02/01/2009    MULTIPLE FRAGMENTS OF TUBULAR ADENOMAS   Diabetes mellitus    Frozen shoulder    GERD (gastroesophageal reflux disease)    occ   History of blood transfusion    pregnancy   HLD (hyperlipidemia)    HTN (hypertension)    ACEI cough   Hx of cardiovascular stress test    Lexiscan Myoview (10/2013):  Fixed ant defect most c/w breast attenuation, cannot exclude apical infarct; no ischemia, EF 46%; Low Risk   Nonischemic cardiomyopathy (HCC)    Ech 3/11 difficult study, moderate aortic insuficiency noted. Left evntriculogram 3/11   Obese    Osteoarthritis    Palpitation    PACs noted on telemetry while in the hospital   Pneumonia    hx   Stress incontinence    Past Surgical History:  Procedure Laterality Date   ABDOMINAL HYSTERECTOMY     APPENDECTOMY     BILIARY DILATION  02/09/2022   Procedure: BILIARY DILATION;  Surgeon: Lynann Bologna, MD;  Location: [facility] ENDOSCOPY;  Service: Gastroenterology;;   BLADDER NECK SUSPENSION  1985    CARDIAC CATHETERIZATION     CHOLECYSTECTOMY N/A 08/24/2020   Procedure: LAPAROSCOPIC CHOLECYSTECTOMY;  Surgeon: Harriette Bouillon, MD;  Location: [facility] ORS;  Service: General;  Laterality: N/A;   ERCP N/A 02/09/2022   Procedure: ENDOSCOPIC RETROGRADE CHOLANGIOPANCREATOGRAPHY (ERCP);  Surgeon: Lynann Bologna, MD;  Location: [facility] ENDOSCOPY;  Service: Gastroenterology;  Laterality: N/A;   EYE SURGERY     gall stones     2023   HAND SURGERY     right "had knots cut out"   KNEE ARTHROPLASTY Right 12/09/2013   Procedure: COMPUTER ASSISTED Right TOTAL KNEE ARTHROPLASTY;  Surgeon: Eldred Manges, MD;  Location: [facility] OR;  Service: Orthopedics;  Laterality: Right;   LEFT HEART CATH AND CORONARY ANGIOGRAPHY N/A 08/15/2017   Procedure: LEFT HEART CATH AND CORONARY ANGIOGRAPHY;  Surgeon: Corky Crafts, MD;  Location: [facility] INVASIVE CV LAB;  Service: Cardiovascular;  Laterality: N/A;   REMOVAL OF STONES  02/09/2022   Procedure: REMOVAL OF STONES;  Surgeon: Lynann Bologna, MD;  Location: [facility] ENDOSCOPY;  Service: Gastroenterology;;   Dennison Mascot  02/09/2022   Procedure: Dennison Mascot;  Surgeon: Lynann Bologna, MD;  Location: [facility] ENDOSCOPY;  Service: Gastroenterology;;   TOTAL KNEE ARTHROPLASTY Left 05/13/2020  Procedure: LEFT TOTAL KNEE ARTHROPLASTY;  Surgeon: Eldred Manges, MD;  Location: [facility] OR;  Service: Orthopedics;  Laterality: Left;   TUBAL LIGATION     Patient Active Problem List   Diagnosis Date Noted   Common bile duct (CBD) obstruction 02/09/2022   Acute on chronic diastolic CHF (congestive heart failure) (HCC) 08/24/2020   Acute cholecystitis 08/23/2020   Elevated troponin 08/23/2020   Arthritis of left knee 05/13/2020   S/P total knee arthroplasty, left 05/13/2020   History of total knee arthroplasty, right 03/31/2020   Precordial pain    Chest pain 08/13/2017   Mixed dyslipidemia 08/13/2017   CAD (coronary artery disease), native coronary artery 08/13/2017   Mild episode of recurrent major  depressive disorder (HCC) 11/03/2013   Urge incontinence 04/02/2013   Spinal stenosis, lumbar region, without neurogenic claudication 10/27/2012   Spinal stenosis of lumbar region 10/01/2012   Allergic rhinitis 09/26/2011   Dizziness 01/23/2011   Type II diabetes mellitus (HCC) 06/27/2010   Morbid obesity (HCC) 06/27/2010   Impaired glucose tolerance 06/27/2010   Disorder of bone and cartilage 06/22/2010   HYPERLIPIDEMIA-MIXED 03/07/2010   Essential hypertension 03/07/2010   CARDIOMEGALY 11/30/2009   OTHER DYSPNEA AND RESPIRATORY ABNORMALITIES 11/30/2009   AORTIC REGURGITATION 09/28/2009   Chronic diastolic heart failure (HCC) 09/28/2009   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   Osteoarthrosis involving multiple sites 10/26/2008    PCP: Porfirio Oar PA   REFERRING PROVIDER: Eldred Manges, MD  REFERRING DIAG:  Diagnosis  M62.81 (ICD-10-CM) - Quadriceps weakness    THERAPY DIAG:  Muscle weakness (generalized)  Difficulty in walking, not elsewhere classified  Stiffness of left knee, not elsewhere classified  Stiffness of right knee, not elsewhere classified  Unsteadiness on feet  Rationale for Evaluation and Treatment: Rehabilitation  ONSET DATE: chronic   SUBJECTIVE:   SUBJECTIVE STATEMENT: Pt indicated no specific knee pain upon arrival today.  Reported back hurting variably based off WB activity.    PERTINENT HISTORY: Anginal pain, anxiety, aortic insufficiency, CAD, CHF, DM, hx frozen shoulder, HLD, HTN, cardiomyopathy, obesity, OA, cardiac cath, B TKAs, hand surgery  PAIN:  NPRS scale: 5/10 Pain location: back upon arrival  Pain description: achy, spasms Aggravating factors: walking/standing Relieving factors: nothing specific    PRECAUTIONS: None  WEIGHT BEARING RESTRICTIONS: No  FALLS:  Has patient fallen in last 6 months? No  LIVING ENVIRONMENT: Lives with: lives with their spouse Lives in: House/apartment Stairs: has upstairs and downstairs  in the house but can stay on first floor  Has following equipment at home: Single point cane, Walker - 2 wheeled, and bed side commode  OCCUPATION: retired   PLOF: Independent, Independent with basic ADLs, and Independent with household mobility with device  PATIENT GOALS: get away from cane, get stronger, address back pain   NEXT MD VISIT: Dr. Ophelia Charter 04/16/23  OBJECTIVE:   DIAGNOSTIC FINDINGS: AP pelvis frog-leg left hip demonstrates some joint space narrowing with  small marginal osteophytes left hip more than right hip.   Impression: Mild left hip osteoarthritis.  AP and lateral lumbar spine images are obtained and reviewed.  Significant  to space narrowing L3-4 with facet arthropathy.  1 mm anterolisthesis  L4-5.  1 cm kidney stones noted right side.   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 more than L4-5.  PATIENT SURVEYS:  02/01/2023 intake on 2nd visit: 41  predicted:45  01/28/2023 FOTO next visit pt late  COGNITION: 01/28/2023 Overall cognitive status: Within functional limits for tasks assessed  SENSATION: 01/28/2023 Not tested  PALPATION: 01/28/2023 Rt lumbar paraspinals tight and tender  LOWER EXTREMITY ROM:  Active ROM  Seated at edge of mat table  Right 01/28/2023 Left 01/28/2023  Hip flexion    Hip extension    Hip abduction    Hip adduction    Hip internal rotation    Hip external rotation    Knee flexion 92* 88*  Knee extension -3* 2*  Ankle dorsiflexion    Ankle plantarflexion    Ankle inversion    Ankle eversion     (Blank rows = not tested)  LOWER EXTREMITY MMT:  MMT Right 01/28/2023 Left 01/28/2023  Hip flexion 2+ 2+  Hip extension    Hip abduction    Hip adduction    Hip internal rotation    Hip external rotation    Knee flexion 4+ 3+  Knee extension 4+ 3  Ankle dorsiflexion 5 3   Ankle plantarflexion    Ankle inversion    Ankle eversion     (Blank rows = not tested)    FUNCTIONAL TESTS:  02/01/2023: TUG c SPC 18.5  seconds   01/28/2023 Timed up and go (TUG): next visit   GAIT: 01/28/2023 Distance walked: in clinic distances  Assistive device utilized: Single point cane Level of assistance: Modified independence Comments: short step lengths/stance times, tends to shuffle a bit, generally unsteady    TODAY'S TREATMENT:                                                                         DATE: 02/01/2023 Therex Nustep lvl 5 10 mins UE/LE for ROM Supine lumbar trunk rotation stretch 15 sec x 3 bilateral Supine quad set Lt 5 sec hold x 10 Seated quad set education Additional time spent in review of HEP techniques added today.   Physical Performance Testing: TUG performance with SPC x  2(one false testing thrown out due to confusion on testing procedure).  Additional time spent in education of testing.   TODAY'S TREATMENT:                                                                         DATE: 01/28/2023 Eval Objective measures, appropriate education, care planning  TherEx LAQs red TB x5 B HS curls red TB x5 B Seated hip ABD red TB x5 Seated marches no resistance x5 B     PATIENT EDUCATION:  01/28/2023 Education details: exam findings, POC, HEP  Person educated: Patient Education method: Programmer, multimedia, Demonstration, and Handouts Education comprehension: verbalized understanding, returned demonstration, and needs further education  HOME EXERCISE PROGRAM: Access Code: [email or web address] URL: [email or web address] Date: 02/01/2023 Prepared by: Chyrel Masson  Exercises - Sitting Knee Extension with Resistance  - 1-2 x daily - 7 x weekly - 1 sets - 10 reps - 2 seconds  hold - Seated Hamstring Curl with Anchored Resistance  - 1-2 x daily - 7 x weekly - 1 sets - 10 reps - 2 seconds  hold - Seated Hip Abduction with Resistance  - 1-2 x daily - 7 x weekly - 1 sets - 10 reps - 2 seconds  hold - Seated March  - 1-2 x daily - 7 x weekly - 1 sets - 10 reps - 1 second  hold - Supine Lower  Trunk Rotation  - 2-3 x daily - 7 x weekly - 1 sets - 3-5 reps - 15 hold - Seated Quad Set  - 3-5 x daily - 7 x weekly - 1 sets - 10 reps - 5 hold  ASSESSMENT:  CLINICAL IMPRESSION: Additional time required today for FOTO intake and TUG testing.  Presentation of altered gait pattern due to strength/pain symptoms.  Altered gait absolutely impacts back/hip complaints.  Continued skilled PT services warranted at this time.   OBJECTIVE IMPAIRMENTS: Abnormal gait, decreased activity tolerance, decreased balance, decreased coordination, decreased endurance, decreased mobility, difficulty walking, decreased ROM, decreased strength, impaired flexibility, impaired UE functional use, obesity, and pain.   ACTIVITY LIMITATIONS: standing, squatting, stairs, transfers, and locomotion level  PARTICIPATION LIMITATIONS: driving, shopping, community activity, and church  PERSONAL FACTORS: Age, Behavior pattern, Education, Fitness, Past/current experiences, Social background, and Time since onset of injury/illness/exacerbation are also affecting patient's functional outcome.   REHAB POTENTIAL: Fair chronicity of impairments  CLINICAL DECISION MAKING: Stable/uncomplicated  EVALUATION COMPLEXITY: Low   GOALS: Goals reviewed with patient? Yes  SHORT TERM GOALS: Target date: 02/18/2023   Will be compliant with appropriate progressive HEP  Baseline: Goal status: INITIAL  2.  Back pain to have improved by 50%  Baseline:  Goal status: INITIAL  3.  Will be able to ambulate household distances with improved stance/step times/lengths and no unsteadiness Baseline:  Goal status: INITIAL    LONG TERM GOALS: Target date: 03/11/2023    MMT to improve by 1 grade all weak groups  Baseline:  Goal status: INITIAL  2.  Will complete TUG in 15 seconds or less no device  Baseline:  Goal status: INITIAL  3.  Back pain to have improved by 75%  Baseline:  Goal status: INITIAL  4.  Will be able to ambulate  at least household distances no device no unsteadiness  Baseline:  Goal status: INITIAL  5.  Knee flexion AROM to improve by 10 degrees B  Baseline:  Goal status: INITIAL  6. TUG 45 or greater  Goal status new:    PLAN:  PT FREQUENCY: 2x/week  PT DURATION: 6 weeks  PLANNED INTERVENTIONS: Therapeutic exercises, Therapeutic activity, Neuromuscular re-education, Balance training, Gait training, Patient/Family education, Self Care, Joint mobilization, Stair training, DME instructions, Aquatic Therapy, Dry Needling, Electrical stimulation, Spinal mobilization, Cryotherapy, Moist heat, Taping, Ultrasound, Ionotophoresis 4mg /ml Dexamethasone, Manual therapy, and Re-evaluation  PLAN FOR NEXT SESSION: Progressive mobility improvements for lumbar for symptom relief as well as general LE strengthening to tolerance.    Chyrel Masson, PT, DPT, OCS, ATC 02/01/23  11:56 AM

## 2023-02-04 ENCOUNTER — Ambulatory Visit: Payer: Medicare Other | Admitting: Physical Therapy

## 2023-02-04 ENCOUNTER — Encounter: Payer: Self-pay | Admitting: Physical Therapy

## 2023-02-04 DIAGNOSIS — M25661 Stiffness of right knee, not elsewhere classified: Secondary | ICD-10-CM

## 2023-02-04 DIAGNOSIS — M25662 Stiffness of left knee, not elsewhere classified: Secondary | ICD-10-CM

## 2023-02-04 DIAGNOSIS — R262 Difficulty in walking, not elsewhere classified: Secondary | ICD-10-CM

## 2023-02-04 DIAGNOSIS — M6281 Muscle weakness (generalized): Secondary | ICD-10-CM

## 2023-02-04 DIAGNOSIS — R2681 Unsteadiness on feet: Secondary | ICD-10-CM

## 2023-02-04 NOTE — Therapy (Signed)
OUTPATIENT PHYSICAL THERAPY TREATMENT   Patient Name: Julie Graves MRN: [id number] DOB:11-13-1940, 82 y.o., female Today's Date: 02/04/2023  END OF SESSION:  PT End of Session - 02/04/23 1300     Visit Number 3    Number of Visits 13    Date for PT Re-Evaluation 03/11/23    Authorization Time Period 01/28/23 to 03/11/23    Progress Note Due on Visit 10    PT Start Time 1301    PT Stop Time 1340    PT Time Calculation (min) 39 min    Activity Tolerance Patient tolerated treatment well;No increased pain    Behavior During Therapy [facility] for tasks assessed/performed               Past Medical History:  Diagnosis Date   Anginal pain (HCC)    "small pain?nerves in chest"started 10/05/13    Anginal pain (HCC)    cleared by cardiology 3/15   Anxiety    rx given recent not taken yet   Aortic insufficiency    mild on 9/11 cath   CAD (coronary artery disease)    nonobstructive let heart cath 3/11: 40% ostial D1, 30% mid CFX   CHF (congestive heart failure) (HCC)    Colon polyps 02/01/2009    MULTIPLE FRAGMENTS OF TUBULAR ADENOMAS   Diabetes mellitus    Frozen shoulder    GERD (gastroesophageal reflux disease)    occ   History of blood transfusion    pregnancy   HLD (hyperlipidemia)    HTN (hypertension)    ACEI cough   Hx of cardiovascular stress test    Lexiscan Myoview (10/2013):  Fixed ant defect most c/w breast attenuation, cannot exclude apical infarct; no ischemia, EF 46%; Low Risk   Nonischemic cardiomyopathy (HCC)    Ech 3/11 difficult study, moderate aortic insuficiency noted. Left evntriculogram 3/11   Obese    Osteoarthritis    Palpitation    PACs noted on telemetry while in the hospital   Pneumonia    hx   Stress incontinence    Past Surgical History:  Procedure Laterality Date   ABDOMINAL HYSTERECTOMY     APPENDECTOMY     BILIARY DILATION  02/09/2022   Procedure: BILIARY DILATION;  Surgeon: Lynann Bologna, MD;  Location: [facility] ENDOSCOPY;  Service:  Gastroenterology;;   BLADDER NECK SUSPENSION  1985   CARDIAC CATHETERIZATION     CHOLECYSTECTOMY N/A 08/24/2020   Procedure: LAPAROSCOPIC CHOLECYSTECTOMY;  Surgeon: Harriette Bouillon, MD;  Location: [facility] ORS;  Service: General;  Laterality: N/A;   ERCP N/A 02/09/2022   Procedure: ENDOSCOPIC RETROGRADE CHOLANGIOPANCREATOGRAPHY (ERCP);  Surgeon: Lynann Bologna, MD;  Location: [facility] ENDOSCOPY;  Service: Gastroenterology;  Laterality: N/A;   EYE SURGERY     gall stones     2023   HAND SURGERY     right "had knots cut out"   KNEE ARTHROPLASTY Right 12/09/2013   Procedure: COMPUTER ASSISTED Right TOTAL KNEE ARTHROPLASTY;  Surgeon: Eldred Manges, MD;  Location: [facility] OR;  Service: Orthopedics;  Laterality: Right;   LEFT HEART CATH AND CORONARY ANGIOGRAPHY N/A 08/15/2017   Procedure: LEFT HEART CATH AND CORONARY ANGIOGRAPHY;  Surgeon: Corky Crafts, MD;  Location: [facility] INVASIVE CV LAB;  Service: Cardiovascular;  Laterality: N/A;   REMOVAL OF STONES  02/09/2022   Procedure: REMOVAL OF STONES;  Surgeon: Lynann Bologna, MD;  Location: [facility] ENDOSCOPY;  Service: Gastroenterology;;   Dennison Mascot  02/09/2022   Procedure: Dennison Mascot;  Surgeon: Lynann Bologna, MD;  Location:  [facility] ENDOSCOPY;  Service: Gastroenterology;;   TOTAL KNEE ARTHROPLASTY Left 05/13/2020   Procedure: LEFT TOTAL KNEE ARTHROPLASTY;  Surgeon: Eldred Manges, MD;  Location: [facility] OR;  Service: Orthopedics;  Laterality: Left;   TUBAL LIGATION     Patient Active Problem List   Diagnosis Date Noted   Common bile duct (CBD) obstruction 02/09/2022   Acute on chronic diastolic CHF (congestive heart failure) (HCC) 08/24/2020   Acute cholecystitis 08/23/2020   Elevated troponin 08/23/2020   Arthritis of left knee 05/13/2020   S/P total knee arthroplasty, left 05/13/2020   History of total knee arthroplasty, right 03/31/2020   Precordial pain    Chest pain 08/13/2017   Mixed dyslipidemia 08/13/2017   CAD (coronary artery disease), native coronary  artery 08/13/2017   Mild episode of recurrent major depressive disorder (HCC) 11/03/2013   Urge incontinence 04/02/2013   Spinal stenosis, lumbar region, without neurogenic claudication 10/27/2012   Spinal stenosis of lumbar region 10/01/2012   Allergic rhinitis 09/26/2011   Dizziness 01/23/2011   Type II diabetes mellitus (HCC) 06/27/2010   Morbid obesity (HCC) 06/27/2010   Impaired glucose tolerance 06/27/2010   Disorder of bone and cartilage 06/22/2010   HYPERLIPIDEMIA-MIXED 03/07/2010   Essential hypertension 03/07/2010   CARDIOMEGALY 11/30/2009   OTHER DYSPNEA AND RESPIRATORY ABNORMALITIES 11/30/2009   AORTIC REGURGITATION 09/28/2009   Chronic diastolic heart failure (HCC) 09/28/2009   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   Osteoarthrosis involving multiple sites 10/26/2008    PCP: Porfirio Oar PA   REFERRING PROVIDER: Eldred Manges, MD  REFERRING DIAG:  Diagnosis  M62.81 (ICD-10-CM) - Quadriceps weakness    THERAPY DIAG:  Muscle weakness (generalized)  Difficulty in walking, not elsewhere classified  Stiffness of left knee, not elsewhere classified  Stiffness of right knee, not elsewhere classified  Unsteadiness on feet  Rationale for Evaluation and Treatment: Rehabilitation  ONSET DATE: chronic   SUBJECTIVE:   SUBJECTIVE STATEMENT: Pt states she felt good after last session, felt the low back seemed to respond well to new stretch. No pain at present, just a little tenderness about her hip. HEP going well, able to do about once a day  PERTINENT HISTORY: Anginal pain, anxiety, aortic insufficiency, CAD, CHF, DM, hx frozen shoulder, HLD, HTN, cardiomyopathy, obesity, OA, cardiac cath, B TKAs, hand surgery  PAIN:  NPRS scale: 5/10 Pain location: back upon arrival  Pain description: achy, spasms Aggravating factors: walking/standing Relieving factors: nothing specific    PRECAUTIONS: None  WEIGHT BEARING RESTRICTIONS: No  FALLS:  Has patient  fallen in last 6 months? No  LIVING ENVIRONMENT: Lives with: lives with their spouse Lives in: House/apartment Stairs: has upstairs and downstairs in the house but can stay on first floor  Has following equipment at home: Single point cane, Walker - 2 wheeled, and bed side commode  OCCUPATION: retired   PLOF: Independent, Independent with basic ADLs, and Independent with household mobility with device  PATIENT GOALS: get away from cane, get stronger, address back pain   NEXT MD VISIT: Dr. Ophelia Charter 04/16/23  OBJECTIVE: (objective measures completed at initial evaluation unless otherwise dated)   DIAGNOSTIC FINDINGS: AP pelvis frog-leg left hip demonstrates some joint space narrowing with  small marginal osteophytes left hip more than right hip.   Impression: Mild left hip osteoarthritis.  AP and lateral lumbar spine images are obtained and reviewed.  Significant  to space narrowing L3-4 with facet arthropathy.  1 mm anterolisthesis  L4-5.  1 cm kidney stones noted right side.  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 more than L4-5.  PATIENT SURVEYS:  02/01/2023 intake on 2nd visit: 41  predicted:45  01/28/2023 FOTO next visit pt late  COGNITION: 01/28/2023 Overall cognitive status: Within functional limits for tasks assessed     SENSATION: 01/28/2023 Not tested  PALPATION: 01/28/2023 Rt lumbar paraspinals tight and tender  LOWER EXTREMITY ROM:  Active ROM  Seated at edge of mat table  Right 01/28/2023 Left 01/28/2023  Hip flexion    Hip extension    Hip abduction    Hip adduction    Hip internal rotation    Hip external rotation    Knee flexion 92* 88*  Knee extension -3* 2*  Ankle dorsiflexion    Ankle plantarflexion    Ankle inversion    Ankle eversion     (Blank rows = not tested)  LOWER EXTREMITY MMT:  MMT Right 01/28/2023 Left 01/28/2023  Hip flexion 2+ 2+  Hip extension    Hip abduction    Hip adduction    Hip internal rotation    Hip  external rotation    Knee flexion 4+ 3+  Knee extension 4+ 3  Ankle dorsiflexion 5 3   Ankle plantarflexion    Ankle inversion    Ankle eversion     (Blank rows = not tested)    FUNCTIONAL TESTS:  02/01/2023: TUG c SPC 18.5 seconds   01/28/2023 Timed up and go (TUG): next visit   GAIT: 01/28/2023 Distance walked: in clinic distances  Assistive device utilized: Single point cane Level of assistance: Modified independence Comments: short step lengths/stance times, tends to shuffle a bit, generally unsteady    TODAY'S TREATMENT:                                                                          [facility] Adult PT Treatment:                                                DATE: 02/04/23 Therapeutic Exercise: Supine LTR x10 BIL cues for breath control  Supine L quad set 2x8 cues for quad contraction and breath control Seated hip abduction red band 2x10 cues for form and pacing  Seated marches red band 2x10 cues for pacing Sit to stand from lowest mat, 3x5 cues for improved pushing through LE  Standing heel raises at counter for support x12 cues for trunk mechanics/lean Standing cone taps x10 BIL LE HEP update/review + education   DATE: 02/01/2023 Therex Nustep lvl 5 10 mins UE/LE for ROM Supine lumbar trunk rotation stretch 15 sec x 3 bilateral Supine quad set Lt 5 sec hold x 10 Seated quad set education Additional time spent in review of HEP techniques added today.   Physical Performance Testing: TUG performance with SPC x  2(one false testing thrown out due to confusion on testing procedure).  Additional time spent in education of testing.   TODAY'S TREATMENT:  DATE: 01/28/2023 Eval Objective measures, appropriate education, care planning  TherEx LAQs red TB x5 B HS curls red TB x5 B Seated hip ABD red TB x5 Seated marches no resistance x5 B     PATIENT EDUCATION:  01/28/2023 Education details:  rationale for interventions, HEP  Person educated: Patient Education method: Programmer, multimedia, Demonstration, and Handouts Education comprehension: verbalized understanding, returned demonstration, and needs further education  HOME EXERCISE PROGRAM: Access Code: [email or web address] URL: [email or web address] Date: 02/04/2023 Prepared by: Fransisco Hertz  Exercises - Sitting Knee Extension with Resistance  - 1-2 x daily - 7 x weekly - 1 sets - 10 reps - 2 seconds  hold - Seated Hamstring Curl with Anchored Resistance  - 1-2 x daily - 7 x weekly - 1 sets - 10 reps - 2 seconds  hold - Seated Hip Abduction with Resistance  - 1-2 x daily - 7 x weekly - 1 sets - 10 reps - 2 seconds  hold - Supine Lower Trunk Rotation  - 2-3 x daily - 7 x weekly - 1 sets - 3-5 reps - 15 hold - Seated Quad Set  - 3-5 x daily - 7 x weekly - 1 sets - 10 reps - 5 hold - Standing March with Counter Support  - 2-3 x daily - 7 x weekly - 1 sets - 10 reps  ASSESSMENT:  CLINICAL IMPRESSION: Pt arrives w/o overt pain, reports back stretches were helpful last session. Today focusing on expanding volume with lower extremity strengthening and increasing time in WB. Pt tolerates well overall, no increases in resting pain and no adverse events. Cues as above, reports feeling less stiffness and no pain at end of session. Recommend continuing along current POC in order to address relevant deficits and improve functional tolerance. Pt departs today's session in no acute distress, all voiced questions/concerns addressed appropriately from PT perspective.     OBJECTIVE IMPAIRMENTS: Abnormal gait, decreased activity tolerance, decreased balance, decreased coordination, decreased endurance, decreased mobility, difficulty walking, decreased ROM, decreased strength, impaired flexibility, impaired UE functional use, obesity, and pain.   ACTIVITY LIMITATIONS: standing, squatting, stairs, transfers, and locomotion level  PARTICIPATION  LIMITATIONS: driving, shopping, community activity, and church  PERSONAL FACTORS: Age, Behavior pattern, Education, Fitness, Past/current experiences, Social background, and Time since onset of injury/illness/exacerbation are also affecting patient's functional outcome.   REHAB POTENTIAL: Fair chronicity of impairments  CLINICAL DECISION MAKING: Stable/uncomplicated  EVALUATION COMPLEXITY: Low   GOALS: Goals reviewed with patient? Yes  SHORT TERM GOALS: Target date: 02/18/2023   Will be compliant with appropriate progressive HEP  Baseline: Goal status: INITIAL  2.  Back pain to have improved by 50%  Baseline:  Goal status: INITIAL  3.  Will be able to ambulate household distances with improved stance/step times/lengths and no unsteadiness Baseline:  Goal status: INITIAL    LONG TERM GOALS: Target date: 03/11/2023    MMT to improve by 1 grade all weak groups  Baseline:  Goal status: INITIAL  2.  Will complete TUG in 15 seconds or less no device  Baseline:  Goal status: INITIAL  3.  Back pain to have improved by 75%  Baseline:  Goal status: INITIAL  4.  Will be able to ambulate at least household distances no device no unsteadiness  Baseline:  Goal status: INITIAL  5.  Knee flexion AROM to improve by 10 degrees B  Baseline:  Goal status: INITIAL  6. TUG 45 or greater  Goal status new:    PLAN:  PT FREQUENCY: 2x/week  PT DURATION: 6 weeks  PLANNED INTERVENTIONS: Therapeutic exercises, Therapeutic activity, Neuromuscular re-education, Balance training, Gait training, Patient/Family education, Self Care, Joint mobilization, Stair training, DME instructions, Aquatic Therapy, Dry Needling, Electrical stimulation, Spinal mobilization, Cryotherapy, Moist heat, Taping, Ultrasound, Ionotophoresis 4mg /ml Dexamethasone, Manual therapy, and Re-evaluation  PLAN FOR NEXT SESSION: Progressive mobility improvements for lumbar for symptom relief as well as general LE  strengthening to tolerance.    Ashley Murrain PT, DPT 02/04/2023 1:43 PM

## 2023-02-12 ENCOUNTER — Encounter: Payer: Medicare Other | Admitting: Physical Therapy

## 2023-02-14 ENCOUNTER — Encounter: Payer: Self-pay | Admitting: Rehabilitative and Restorative Service Providers"

## 2023-02-14 ENCOUNTER — Ambulatory Visit: Payer: Medicare Other | Admitting: Rehabilitative and Restorative Service Providers"

## 2023-02-14 DIAGNOSIS — M25661 Stiffness of right knee, not elsewhere classified: Secondary | ICD-10-CM | POA: Diagnosis not present

## 2023-02-14 DIAGNOSIS — M6281 Muscle weakness (generalized): Secondary | ICD-10-CM | POA: Diagnosis not present

## 2023-02-14 DIAGNOSIS — M25662 Stiffness of left knee, not elsewhere classified: Secondary | ICD-10-CM

## 2023-02-14 DIAGNOSIS — R2681 Unsteadiness on feet: Secondary | ICD-10-CM

## 2023-02-14 DIAGNOSIS — R262 Difficulty in walking, not elsewhere classified: Secondary | ICD-10-CM | POA: Diagnosis not present

## 2023-02-14 NOTE — Therapy (Signed)
OUTPATIENT PHYSICAL THERAPY TREATMENT   Patient Name: Julie Graves MRN: [id number] DOB:1940-10-03, 82 y.o., female Today's Date: 02/14/2023  END OF SESSION:  PT End of Session - 02/14/23 1107     Visit Number 4    Number of Visits 13    Date for PT Re-Evaluation 03/11/23    Authorization Time Period 01/28/23 to 03/11/23    Progress Note Due on Visit 10    PT Start Time 1101    PT Stop Time 1140    PT Time Calculation (min) 39 min    Activity Tolerance Patient tolerated treatment well    Behavior During Therapy [facility] for tasks assessed/performed                Past Medical History:  Diagnosis Date   Anginal pain (HCC)    "small pain?nerves in chest"started 10/05/13    Anginal pain (HCC)    cleared by cardiology 3/15   Anxiety    rx given recent not taken yet   Aortic insufficiency    mild on 9/11 cath   CAD (coronary artery disease)    nonobstructive let heart cath 3/11: 40% ostial D1, 30% mid CFX   CHF (congestive heart failure) (HCC)    Colon polyps 02/01/2009    MULTIPLE FRAGMENTS OF TUBULAR ADENOMAS   Diabetes mellitus    Frozen shoulder    GERD (gastroesophageal reflux disease)    occ   History of blood transfusion    pregnancy   HLD (hyperlipidemia)    HTN (hypertension)    ACEI cough   Hx of cardiovascular stress test    Lexiscan Myoview (10/2013):  Fixed ant defect most c/w breast attenuation, cannot exclude apical infarct; no ischemia, EF 46%; Low Risk   Nonischemic cardiomyopathy (HCC)    Ech 3/11 difficult study, moderate aortic insuficiency noted. Left evntriculogram 3/11   Obese    Osteoarthritis    Palpitation    PACs noted on telemetry while in the hospital   Pneumonia    hx   Stress incontinence    Past Surgical History:  Procedure Laterality Date   ABDOMINAL HYSTERECTOMY     APPENDECTOMY     BILIARY DILATION  02/09/2022   Procedure: BILIARY DILATION;  Surgeon: Lynann Bologna, MD;  Location: [facility] ENDOSCOPY;  Service:  Gastroenterology;;   BLADDER NECK SUSPENSION  1985   CARDIAC CATHETERIZATION     CHOLECYSTECTOMY N/A 08/24/2020   Procedure: LAPAROSCOPIC CHOLECYSTECTOMY;  Surgeon: Harriette Bouillon, MD;  Location: [facility] ORS;  Service: General;  Laterality: N/A;   ERCP N/A 02/09/2022   Procedure: ENDOSCOPIC RETROGRADE CHOLANGIOPANCREATOGRAPHY (ERCP);  Surgeon: Lynann Bologna, MD;  Location: [facility] ENDOSCOPY;  Service: Gastroenterology;  Laterality: N/A;   EYE SURGERY     gall stones     2023   HAND SURGERY     right "had knots cut out"   KNEE ARTHROPLASTY Right 12/09/2013   Procedure: COMPUTER ASSISTED Right TOTAL KNEE ARTHROPLASTY;  Surgeon: Eldred Manges, MD;  Location: [facility] OR;  Service: Orthopedics;  Laterality: Right;   LEFT HEART CATH AND CORONARY ANGIOGRAPHY N/A 08/15/2017   Procedure: LEFT HEART CATH AND CORONARY ANGIOGRAPHY;  Surgeon: Corky Crafts, MD;  Location: [facility] INVASIVE CV LAB;  Service: Cardiovascular;  Laterality: N/A;   REMOVAL OF STONES  02/09/2022   Procedure: REMOVAL OF STONES;  Surgeon: Lynann Bologna, MD;  Location: [facility] ENDOSCOPY;  Service: Gastroenterology;;   Dennison Mascot  02/09/2022   Procedure: Dennison Mascot;  Surgeon: Lynann Bologna, MD;  Location: [facility]  ENDOSCOPY;  Service: Gastroenterology;;   TOTAL KNEE ARTHROPLASTY Left 05/13/2020   Procedure: LEFT TOTAL KNEE ARTHROPLASTY;  Surgeon: Eldred Manges, MD;  Location: [facility] OR;  Service: Orthopedics;  Laterality: Left;   TUBAL LIGATION     Patient Active Problem List   Diagnosis Date Noted   Common bile duct (CBD) obstruction 02/09/2022   Acute on chronic diastolic CHF (congestive heart failure) (HCC) 08/24/2020   Acute cholecystitis 08/23/2020   Elevated troponin 08/23/2020   Arthritis of left knee 05/13/2020   S/P total knee arthroplasty, left 05/13/2020   History of total knee arthroplasty, right 03/31/2020   Precordial pain    Chest pain 08/13/2017   Mixed dyslipidemia 08/13/2017   CAD (coronary artery disease), native coronary  artery 08/13/2017   Mild episode of recurrent major depressive disorder (HCC) 11/03/2013   Urge incontinence 04/02/2013   Spinal stenosis, lumbar region, without neurogenic claudication 10/27/2012   Spinal stenosis of lumbar region 10/01/2012   Allergic rhinitis 09/26/2011   Dizziness 01/23/2011   Type II diabetes mellitus (HCC) 06/27/2010   Morbid obesity (HCC) 06/27/2010   Impaired glucose tolerance 06/27/2010   Disorder of bone and cartilage 06/22/2010   HYPERLIPIDEMIA-MIXED 03/07/2010   Essential hypertension 03/07/2010   CARDIOMEGALY 11/30/2009   OTHER DYSPNEA AND RESPIRATORY ABNORMALITIES 11/30/2009   AORTIC REGURGITATION 09/28/2009   Chronic diastolic heart failure (HCC) 09/28/2009   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   Osteoarthrosis involving multiple sites 10/26/2008    PCP: Porfirio Oar PA   REFERRING PROVIDER: Eldred Manges, MD  REFERRING DIAG:  Diagnosis  M62.81 (ICD-10-CM) - Quadriceps weakness    THERAPY DIAG:  Muscle weakness (generalized)  Difficulty in walking, not elsewhere classified  Stiffness of left knee, not elsewhere classified  Stiffness of right knee, not elsewhere classified  Unsteadiness on feet  Rationale for Evaluation and Treatment: Rehabilitation  ONSET DATE: chronic   SUBJECTIVE:   SUBJECTIVE STATEMENT: Pt indicated no knee pain complaints.  Reported Rt back still giving her a catch.  Sometimes there all day.   PERTINENT HISTORY: Anginal pain, anxiety, aortic insufficiency, CAD, CHF, DM, hx frozen shoulder, HLD, HTN, cardiomyopathy, obesity, OA, cardiac cath, B TKAs, hand surgery  PAIN:  NPRS scale: 8/10 Pain location: back upon arrival  Pain description: achy, spasms Aggravating factors: walking/standing, sitting at times Relieving factors: nothing specific    PRECAUTIONS: None  WEIGHT BEARING RESTRICTIONS: No  FALLS:  Has patient fallen in last 6 months? No  LIVING ENVIRONMENT: Lives with: lives with their  spouse Lives in: House/apartment Stairs: has upstairs and downstairs in the house but can stay on first floor  Has following equipment at home: Single point cane, Walker - 2 wheeled, and bed side commode  OCCUPATION: retired   PLOF: Independent, Independent with basic ADLs, and Independent with household mobility with device  PATIENT GOALS: get away from cane, get stronger, address back pain   NEXT MD VISIT: Dr. Ophelia Charter 04/16/23  OBJECTIVE: (objective measures completed at initial evaluation unless otherwise dated)   DIAGNOSTIC FINDINGS: AP pelvis frog-leg left hip demonstrates some joint space narrowing with  small marginal osteophytes left hip more than right hip.   Impression: Mild left hip osteoarthritis.  AP and lateral lumbar spine images are obtained and reviewed.  Significant  to space narrowing L3-4 with facet arthropathy.  1 mm anterolisthesis  L4-5.  1 cm kidney stones noted right side.   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 more than L4-5.  PATIENT SURVEYS:  02/01/2023 intake on  2nd visit: 41  predicted:45  01/28/2023 FOTO next visit pt late  COGNITION: 01/28/2023 Overall cognitive status: Within functional limits for tasks assessed     SENSATION: 01/28/2023 Not tested  PALPATION: 01/28/2023 Rt lumbar paraspinals tight and tender  LOWER EXTREMITY ROM:  Active ROM  Seated at edge of mat table  Right 01/28/2023 Left 01/28/2023  Hip flexion    Hip extension    Hip abduction    Hip adduction    Hip internal rotation    Hip external rotation    Knee flexion 92* 88*  Knee extension -3* 2*  Ankle dorsiflexion    Ankle plantarflexion    Ankle inversion    Ankle eversion     (Blank rows = not tested)  LOWER EXTREMITY MMT:  MMT Right 01/28/2023 Left 01/28/2023  Hip flexion 2+ 2+  Hip extension    Hip abduction    Hip adduction    Hip internal rotation    Hip external rotation    Knee flexion 4+ 3+  Knee extension 4+ 3  Ankle dorsiflexion 5 3    Ankle plantarflexion    Ankle inversion    Ankle eversion     (Blank rows = not tested)    FUNCTIONAL TESTS:  02/14/2023:  TUG c SPC:  12.7 seconds  02/01/2023: TUG c SPC 18.5 seconds   01/28/2023 Timed up and go (TUG): next visit   GAIT: 01/28/2023 Distance walked: in clinic distances  Assistive device utilized: Single point cane Level of assistance: Modified independence Comments: short step lengths/stance times, tends to shuffle a bit, generally unsteady                     TODAY'S TREATMENT:                                                                          DATE: 02/14/2023 Therex Nustep lvl 5 8 mins UE/LE for ROM Supine lumbar trunk rotation 15 sec x 3 bilateral Supine SKC 15 sec hold x 3 bilateral  Supine green band hip clam shell x 20 with contralateral isometric hold, performed bilaterally Supine bridge 3 x 5 Sit to stand to sit 18 inc chair, no UE x 10  TUG c SPC x 1   TODAY'S TREATMENT:                                                                          DATE: 02/04/23 Therapeutic Exercise: Supine LTR x10 BIL cues for breath control  Supine L quad set 2x8 cues for quad contraction and breath control Seated hip abduction red band 2x10 cues for form and pacing  Seated marches red band 2x10 cues for pacing Sit to stand from lowest mat, 3x5 cues for improved pushing through LE  Standing heel raises at counter for support x12 cues for trunk mechanics/lean Standing cone taps x10 BIL LE HEP update/review + education   TODAY'S TREATMENT:  DATE: 02/01/2023 Therex Nustep lvl 5 10 mins UE/LE for ROM Supine lumbar trunk rotation stretch 15 sec x 3 bilateral Supine quad set Lt 5 sec hold x 10 Seated quad set education Additional time spent in review of HEP techniques added today.   Physical Performance Testing: TUG performance with SPC x  2(one false testing thrown out due to confusion on  testing procedure).  Additional time spent in education of testing.      PATIENT EDUCATION:  01/28/2023 Education details: rationale for interventions, HEP  Person educated: Patient Education method: Explanation, Demonstration, and Handouts Education comprehension: verbalized understanding, returned demonstration, and needs further education  HOME EXERCISE PROGRAM: Access Code: [email or web address] URL: [email or web address] Date: 02/04/2023 Prepared by: Fransisco Hertz  Exercises - Sitting Knee Extension with Resistance  - 1-2 x daily - 7 x weekly - 1 sets - 10 reps - 2 seconds  hold - Seated Hamstring Curl with Anchored Resistance  - 1-2 x daily - 7 x weekly - 1 sets - 10 reps - 2 seconds  hold - Seated Hip Abduction with Resistance  - 1-2 x daily - 7 x weekly - 1 sets - 10 reps - 2 seconds  hold - Supine Lower Trunk Rotation  - 2-3 x daily - 7 x weekly - 1 sets - 3-5 reps - 15 hold - Seated Quad Set  - 3-5 x daily - 7 x weekly - 1 sets - 10 reps - 5 hold - Standing March with Counter Support  - 2-3 x daily - 7 x weekly - 1 sets - 10 reps  ASSESSMENT:  CLINICAL IMPRESSION: Continued complaints reported from back as chief complaint with walking and movement.  Continued to mix lumbar mobility interventions in with goals of LE strengthening. Due to back complaints, limited WB activity performed.   TUG score was improved as noted.  Continued skilled PT services indicated at this time to promote improved symptoms and mobility tolerance.    OBJECTIVE IMPAIRMENTS: Abnormal gait, decreased activity tolerance, decreased balance, decreased coordination, decreased endurance, decreased mobility, difficulty walking, decreased ROM, decreased strength, impaired flexibility, impaired UE functional use, obesity, and pain.   ACTIVITY LIMITATIONS: standing, squatting, stairs, transfers, and locomotion level  PARTICIPATION LIMITATIONS: driving, shopping, community activity, and church  PERSONAL  FACTORS: Age, Behavior pattern, Education, Fitness, Past/current experiences, Social background, and Time since onset of injury/illness/exacerbation are also affecting patient's functional outcome.   REHAB POTENTIAL: Fair chronicity of impairments  CLINICAL DECISION MAKING: Stable/uncomplicated  EVALUATION COMPLEXITY: Low   GOALS: Goals reviewed with patient? Yes  SHORT TERM GOALS: Target date: 02/18/2023   Will be compliant with appropriate progressive HEP  Baseline: Goal status: on going 02/14/2023  2.  Back pain to have improved by 50%  Baseline:  Goal status: on going 02/14/2023  3.  Will be able to ambulate household distances with improved stance/step times/lengths and no unsteadiness Baseline:  Goal status: on going 02/14/2023    LONG TERM GOALS: Target date: 03/11/2023    MMT to improve by 1 grade all weak groups  Baseline:  Goal status: INITIAL  2.  Will complete TUG in 15 seconds or less no device  Baseline:  Goal status: INITIAL  3.  Back pain to have improved by 75%  Baseline:  Goal status: INITIAL  4.  Will be able to ambulate at least household distances no device no unsteadiness  Baseline:  Goal status: INITIAL  5.  Knee flexion AROM to improve by 10 degrees B  Baseline:  Goal status: INITIAL  6. TUG 45 or greater  Goal status new:    PLAN:  PT FREQUENCY: 2x/week  PT DURATION: 6 weeks  PLANNED INTERVENTIONS: Therapeutic exercises, Therapeutic activity, Neuromuscular re-education, Balance training, Gait training, Patient/Family education, Self Care, Joint mobilization, Stair training, DME instructions, Aquatic Therapy, Dry Needling, Electrical stimulation, Spinal mobilization, Cryotherapy, Moist heat, Taping, Ultrasound, Ionotophoresis 4mg /ml Dexamethasone, Manual therapy, and Re-evaluation  PLAN FOR NEXT SESSION: STG date upcoming.  Continue to try to improve lumbar mobility/symptoms to promote ability to perform WB strengthening as tolerated.     Chyrel Masson, PT, DPT, OCS, ATC 02/14/23  11:38 AM

## 2023-02-19 ENCOUNTER — Ambulatory Visit: Payer: Medicare Other | Admitting: Physical Therapy

## 2023-02-19 ENCOUNTER — Encounter: Payer: Self-pay | Admitting: Physical Therapy

## 2023-02-19 DIAGNOSIS — M25661 Stiffness of right knee, not elsewhere classified: Secondary | ICD-10-CM

## 2023-02-19 DIAGNOSIS — R262 Difficulty in walking, not elsewhere classified: Secondary | ICD-10-CM | POA: Diagnosis not present

## 2023-02-19 DIAGNOSIS — M6281 Muscle weakness (generalized): Secondary | ICD-10-CM

## 2023-02-19 DIAGNOSIS — M25662 Stiffness of left knee, not elsewhere classified: Secondary | ICD-10-CM | POA: Diagnosis not present

## 2023-02-19 DIAGNOSIS — R2681 Unsteadiness on feet: Secondary | ICD-10-CM

## 2023-02-19 NOTE — Therapy (Signed)
OUTPATIENT PHYSICAL THERAPY TREATMENT   Patient Name: Julie Graves MRN: [id number] DOB:January 03, 1941, 82 y.o., female Today's Date: 02/19/2023  END OF SESSION:  PT End of Session - 02/19/23 1110     Visit Number 5    Number of Visits 13    Date for PT Re-Evaluation 03/11/23    Progress Note Due on Visit 10    PT Start Time 1104    PT Stop Time 1142    PT Time Calculation (min) 38 min    Activity Tolerance Patient tolerated treatment well    Behavior During Therapy [facility] for tasks assessed/performed                Past Medical History:  Diagnosis Date   Anginal pain (HCC)    "small pain?nerves in chest"started 10/05/13    Anginal pain (HCC)    cleared by cardiology 3/15   Anxiety    rx given recent not taken yet   Aortic insufficiency    mild on 9/11 cath   CAD (coronary artery disease)    nonobstructive let heart cath 3/11: 40% ostial D1, 30% mid CFX   CHF (congestive heart failure) (HCC)    Colon polyps 02/01/2009    MULTIPLE FRAGMENTS OF TUBULAR ADENOMAS   Diabetes mellitus    Frozen shoulder    GERD (gastroesophageal reflux disease)    occ   History of blood transfusion    pregnancy   HLD (hyperlipidemia)    HTN (hypertension)    ACEI cough   Hx of cardiovascular stress test    Lexiscan Myoview (10/2013):  Fixed ant defect most c/w breast attenuation, cannot exclude apical infarct; no ischemia, EF 46%; Low Risk   Nonischemic cardiomyopathy (HCC)    Ech 3/11 difficult study, moderate aortic insuficiency noted. Left evntriculogram 3/11   Obese    Osteoarthritis    Palpitation    PACs noted on telemetry while in the hospital   Pneumonia    hx   Stress incontinence    Past Surgical History:  Procedure Laterality Date   ABDOMINAL HYSTERECTOMY     APPENDECTOMY     BILIARY DILATION  02/09/2022   Procedure: BILIARY DILATION;  Surgeon: Lynann Bologna, MD;  Location: [facility] ENDOSCOPY;  Service: Gastroenterology;;   BLADDER NECK SUSPENSION  1985   CARDIAC  CATHETERIZATION     CHOLECYSTECTOMY N/A 08/24/2020   Procedure: LAPAROSCOPIC CHOLECYSTECTOMY;  Surgeon: Harriette Bouillon, MD;  Location: [facility] ORS;  Service: General;  Laterality: N/A;   ERCP N/A 02/09/2022   Procedure: ENDOSCOPIC RETROGRADE CHOLANGIOPANCREATOGRAPHY (ERCP);  Surgeon: Lynann Bologna, MD;  Location: [facility] ENDOSCOPY;  Service: Gastroenterology;  Laterality: N/A;   EYE SURGERY     gall stones     2023   HAND SURGERY     right "had knots cut out"   KNEE ARTHROPLASTY Right 12/09/2013   Procedure: COMPUTER ASSISTED Right TOTAL KNEE ARTHROPLASTY;  Surgeon: Eldred Manges, MD;  Location: [facility] OR;  Service: Orthopedics;  Laterality: Right;   LEFT HEART CATH AND CORONARY ANGIOGRAPHY N/A 08/15/2017   Procedure: LEFT HEART CATH AND CORONARY ANGIOGRAPHY;  Surgeon: Corky Crafts, MD;  Location: [facility] INVASIVE CV LAB;  Service: Cardiovascular;  Laterality: N/A;   REMOVAL OF STONES  02/09/2022   Procedure: REMOVAL OF STONES;  Surgeon: Lynann Bologna, MD;  Location: [facility] ENDOSCOPY;  Service: Gastroenterology;;   Dennison Mascot  02/09/2022   Procedure: Dennison Mascot;  Surgeon: Lynann Bologna, MD;  Location: [facility] ENDOSCOPY;  Service: Gastroenterology;;   TOTAL KNEE ARTHROPLASTY  Left 05/13/2020   Procedure: LEFT TOTAL KNEE ARTHROPLASTY;  Surgeon: Eldred Manges, MD;  Location: [facility] OR;  Service: Orthopedics;  Laterality: Left;   TUBAL LIGATION     Patient Active Problem List   Diagnosis Date Noted   Common bile duct (CBD) obstruction 02/09/2022   Acute on chronic diastolic CHF (congestive heart failure) (HCC) 08/24/2020   Acute cholecystitis 08/23/2020   Elevated troponin 08/23/2020   Arthritis of left knee 05/13/2020   S/P total knee arthroplasty, left 05/13/2020   History of total knee arthroplasty, right 03/31/2020   Precordial pain    Chest pain 08/13/2017   Mixed dyslipidemia 08/13/2017   CAD (coronary artery disease), native coronary artery 08/13/2017   Mild episode of recurrent major depressive  disorder (HCC) 11/03/2013   Urge incontinence 04/02/2013   Spinal stenosis, lumbar region, without neurogenic claudication 10/27/2012   Spinal stenosis of lumbar region 10/01/2012   Allergic rhinitis 09/26/2011   Dizziness 01/23/2011   Type II diabetes mellitus (HCC) 06/27/2010   Morbid obesity (HCC) 06/27/2010   Impaired glucose tolerance 06/27/2010   Disorder of bone and cartilage 06/22/2010   HYPERLIPIDEMIA-MIXED 03/07/2010   Essential hypertension 03/07/2010   CARDIOMEGALY 11/30/2009   OTHER DYSPNEA AND RESPIRATORY ABNORMALITIES 11/30/2009   AORTIC REGURGITATION 09/28/2009   Chronic diastolic heart failure (HCC) 09/28/2009   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   Osteoarthrosis involving multiple sites 10/26/2008    PCP: Porfirio Oar PA   REFERRING PROVIDER: Eldred Manges, MD  REFERRING DIAG:  Diagnosis  M62.81 (ICD-10-CM) - Quadriceps weakness    THERAPY DIAG:  Muscle weakness (generalized)  Difficulty in walking, not elsewhere classified  Stiffness of left knee, not elsewhere classified  Stiffness of right knee, not elsewhere classified  Unsteadiness on feet  Rationale for Evaluation and Treatment: Rehabilitation  ONSET DATE: chronic   SUBJECTIVE:   SUBJECTIVE STATEMENT: Pt still reporting fear of falls and having to use her straight cane at home and on community surfaces.   PERTINENT HISTORY: Anginal pain, anxiety, aortic insufficiency, CAD, CHF, DM, hx frozen shoulder, HLD, HTN, cardiomyopathy, obesity, OA, cardiac cath, B TKAs, hand surgery  PAIN:  NPRS scale: no specific pain reported this visit, but reports pain can reach 6-7/10 Pain location: back upon arrival  Pain description: achy, spasms Aggravating factors: walking/standing, sitting at times Relieving factors: nothing specific    PRECAUTIONS: None  WEIGHT BEARING RESTRICTIONS: No  FALLS:  Has patient fallen in last 6 months? No  LIVING ENVIRONMENT: Lives with: lives with their  spouse Lives in: House/apartment Stairs: has upstairs and downstairs in the house but can stay on first floor  Has following equipment at home: Single point cane, Walker - 2 wheeled, and bed side commode  OCCUPATION: retired   PLOF: Independent, Independent with basic ADLs, and Independent with household mobility with device  PATIENT GOALS: get away from cane, get stronger, address back pain   NEXT MD VISIT: Dr. Ophelia Charter 04/16/23  OBJECTIVE: (objective measures completed at initial evaluation unless otherwise dated)   DIAGNOSTIC FINDINGS: AP pelvis frog-leg left hip demonstrates some joint space narrowing with  small marginal osteophytes left hip more than right hip.   Impression: Mild left hip osteoarthritis.  AP and lateral lumbar spine images are obtained and reviewed.  Significant  to space narrowing L3-4 with facet arthropathy.  1 mm anterolisthesis  L4-5.  1 cm kidney stones noted right side.   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 more than L4-5.  PATIENT SURVEYS:  02/01/2023 intake  on 2nd visit: 41  predicted:45  01/28/2023 FOTO next visit pt late  COGNITION: 01/28/2023 Overall cognitive status: Within functional limits for tasks assessed     SENSATION: 01/28/2023 Not tested  PALPATION: 01/28/2023 Rt lumbar paraspinals tight and tender  LOWER EXTREMITY ROM:  Active ROM  Seated at edge of mat table  Right 01/28/2023 Left 01/28/2023  Hip flexion    Hip extension    Hip abduction    Hip adduction    Hip internal rotation    Hip external rotation    Knee flexion 92* 88*  Knee extension -3* 2*  Ankle dorsiflexion    Ankle plantarflexion    Ankle inversion    Ankle eversion     (Blank rows = not tested)  LOWER EXTREMITY MMT:  MMT Right 01/28/2023 Left 01/28/2023  Hip flexion 2+ 2+  Hip extension    Hip abduction    Hip adduction    Hip internal rotation    Hip external rotation    Knee flexion 4+ 3+  Knee extension 4+ 3  Ankle dorsiflexion 5 3    Ankle plantarflexion    Ankle inversion    Ankle eversion     (Blank rows = not tested)    FUNCTIONAL TESTS:  02/14/2023:  TUG c SPC:  12.7 seconds  02/01/2023: TUG c SPC 18.5 seconds   01/28/2023 Timed up and go (TUG): next visit   GAIT: 01/28/2023 Distance walked: in clinic distances  Assistive device utilized: Single point cane Level of assistance: Modified independence Comments: short step lengths/stance times, tends to shuffle a bit, generally unsteady                    TODAY'S TREATMENT:                                                                          DATE: 02/19/2023 Therex Recumbent bike: rocking back and forth x 5 minutes Standing calf raises: x 20 c UE support Standing hip flexion x 20 c UE support Standing hip abduction x 20 c UE support Sit to stand: x 10  Supine bridges: 2 x 10  Supine SLR: 2 x 10 bil  Supine clam shells red TB x 20 holding 3 sec Amb clinic distances straight cane, instructions to improve her step length. Pt's cane was lowered one notch to decrease shoulder hiking. Pt was also instructed to use her cane in her Rt UE since her left quad is weaker.      TODAY'S TREATMENT:                                                                          DATE: 02/14/2023 Therex Nustep lvl 5 8 mins UE/LE for ROM Supine lumbar trunk rotation 15 sec x 3 bilateral Supine SKC 15 sec hold x 3 bilateral  Supine green band hip clam shell x 20 with contralateral isometric hold, performed bilaterally Supine bridge 3 x  5 Sit to stand to sit 18 inc chair, no UE x 10  TUG c SPC x 1   TODAY'S TREATMENT:                                                                          DATE: 02/04/23 Therapeutic Exercise: Supine LTR x10 BIL cues for breath control  Supine L quad set 2x8 cues for quad contraction and breath control Seated hip abduction red band 2x10 cues for form and pacing  Seated marches red band 2x10 cues for pacing Sit to stand from lowest mat, 3x5  cues for improved pushing through LE  Standing heel raises at counter for support x12 cues for trunk mechanics/lean Standing cone taps x10 BIL LE HEP update/review + education    PATIENT EDUCATION:  01/28/2023 Education details: rationale for interventions, HEP  Person educated: Patient Education method: Programmer, multimedia, Demonstration, and Handouts Education comprehension: verbalized understanding, returned demonstration, and needs further education  HOME EXERCISE PROGRAM: Access Code: [email or web address] URL: [email or web address] Date: 02/04/2023 Prepared by: Fransisco Hertz  Exercises - Sitting Knee Extension with Resistance  - 1-2 x daily - 7 x weekly - 1 sets - 10 reps - 2 seconds  hold - Seated Hamstring Curl with Anchored Resistance  - 1-2 x daily - 7 x weekly - 1 sets - 10 reps - 2 seconds  hold - Seated Hip Abduction with Resistance  - 1-2 x daily - 7 x weekly - 1 sets - 10 reps - 2 seconds  hold - Supine Lower Trunk Rotation  - 2-3 x daily - 7 x weekly - 1 sets - 3-5 reps - 15 hold - Seated Quad Set  - 3-5 x daily - 7 x weekly - 1 sets - 10 reps - 5 hold - Standing March with Counter Support  - 2-3 x daily - 7 x weekly - 1 sets - 10 reps  ASSESSMENT:  CLINICAL IMPRESSION: Pt tolerating more weight bearing exercises in standing position today. Pt reporting no pain this visit in her low back. Pt's cane was adjusted to prevent shoulder hiking and pt was instructed to use it in her Rt UE for better support. Treatment still focusing on quad strengthening and lumbar mobility to hep to maximize pt's function.    OBJECTIVE IMPAIRMENTS: Abnormal gait, decreased activity tolerance, decreased balance, decreased coordination, decreased endurance, decreased mobility, difficulty walking, decreased ROM, decreased strength, impaired flexibility, impaired UE functional use, obesity, and pain.   ACTIVITY LIMITATIONS: standing, squatting, stairs, transfers, and locomotion level  PARTICIPATION  LIMITATIONS: driving, shopping, community activity, and church  PERSONAL FACTORS: Age, Behavior pattern, Education, Fitness, Past/current experiences, Social background, and Time since onset of injury/illness/exacerbation are also affecting patient's functional outcome.   REHAB POTENTIAL: Fair chronicity of impairments  CLINICAL DECISION MAKING: Stable/uncomplicated  EVALUATION COMPLEXITY: Low   GOALS: Goals reviewed with patient? Yes  SHORT TERM GOALS: Target date: 02/18/2023   Will be compliant with appropriate progressive HEP  Baseline: Goal status: MET 02/19/23  2.  Back pain to have improved by 50%  Baseline:  Goal status: Not MET 02/19/2023, pt stating it has improved about 25%  3.  Will be able to ambulate household distances with improved stance/step times/lengths and  no unsteadiness Baseline:  Goal status:  MET 02/19/23, using straight cane    LONG TERM GOALS: Target date: 03/11/2023    MMT to improve by 1 grade all weak groups  Baseline:  Goal status: INITIAL  2.  Will complete TUG in 15 seconds or less no device  Baseline:  Goal status: INITIAL  3.  Back pain to have improved by 75%  Baseline:  Goal status: INITIAL  4.  Will be able to ambulate at least household distances no device no unsteadiness  Baseline:  Goal status: INITIAL  5.  Knee flexion AROM to improve by 10 degrees B  Baseline:  Goal status: INITIAL       PLAN:  PT FREQUENCY: 2x/week  PT DURATION: 6 weeks  PLANNED INTERVENTIONS: Therapeutic exercises, Therapeutic activity, Neuromuscular re-education, Balance training, Gait training, Patient/Family education, Self Care, Joint mobilization, Stair training, DME instructions, Aquatic Therapy, Dry Needling, Electrical stimulation, Spinal mobilization, Cryotherapy, Moist heat, Taping, Ultrasound, Ionotophoresis 4mg /ml Dexamethasone, Manual therapy, and Re-evaluation  PLAN FOR NEXT SESSION:  Continue to try to improve lumbar  mobility/symptoms to promote ability to perform WB strengthening as tolerated.   How did using cane in Rt UE work?   Narda Amber, PT,MPT 02/19/23 11:28 AM   02/19/23  11:28 AM

## 2023-02-21 ENCOUNTER — Encounter: Payer: Medicare Other | Admitting: Rehabilitative and Restorative Service Providers"

## 2023-02-26 ENCOUNTER — Encounter: Payer: Self-pay | Admitting: Physical Therapy

## 2023-02-26 ENCOUNTER — Ambulatory Visit: Payer: Medicare Other | Admitting: Physical Therapy

## 2023-02-26 DIAGNOSIS — M25662 Stiffness of left knee, not elsewhere classified: Secondary | ICD-10-CM | POA: Diagnosis not present

## 2023-02-26 DIAGNOSIS — R262 Difficulty in walking, not elsewhere classified: Secondary | ICD-10-CM | POA: Diagnosis not present

## 2023-02-26 DIAGNOSIS — M6281 Muscle weakness (generalized): Secondary | ICD-10-CM

## 2023-02-26 DIAGNOSIS — M25661 Stiffness of right knee, not elsewhere classified: Secondary | ICD-10-CM

## 2023-02-26 NOTE — Therapy (Signed)
OUTPATIENT PHYSICAL THERAPY TREATMENT   Patient Name: Julie Graves MRN: [id number] DOB:09-05-1940, 82 y.o., female Today's Date: 02/26/2023  END OF SESSION:  PT End of Session - 02/26/23 1111     Visit Number 6    Number of Visits 13    Date for PT Re-Evaluation 03/11/23    Progress Note Due on Visit 10    PT Start Time 1107    PT Stop Time 1145    PT Time Calculation (min) 38 min    Activity Tolerance Patient tolerated treatment well    Behavior During Therapy [facility] for tasks assessed/performed                Past Medical History:  Diagnosis Date   Anginal pain (HCC)    "small pain?nerves in chest"started 10/05/13    Anginal pain (HCC)    cleared by cardiology 3/15   Anxiety    rx given recent not taken yet   Aortic insufficiency    mild on 9/11 cath   CAD (coronary artery disease)    nonobstructive let heart cath 3/11: 40% ostial D1, 30% mid CFX   CHF (congestive heart failure) (HCC)    Colon polyps 02/01/2009    MULTIPLE FRAGMENTS OF TUBULAR ADENOMAS   Diabetes mellitus    Frozen shoulder    GERD (gastroesophageal reflux disease)    occ   History of blood transfusion    pregnancy   HLD (hyperlipidemia)    HTN (hypertension)    ACEI cough   Hx of cardiovascular stress test    Lexiscan Myoview (10/2013):  Fixed ant defect most c/w breast attenuation, cannot exclude apical infarct; no ischemia, EF 46%; Low Risk   Nonischemic cardiomyopathy (HCC)    Ech 3/11 difficult study, moderate aortic insuficiency noted. Left evntriculogram 3/11   Obese    Osteoarthritis    Palpitation    PACs noted on telemetry while in the hospital   Pneumonia    hx   Stress incontinence    Past Surgical History:  Procedure Laterality Date   ABDOMINAL HYSTERECTOMY     APPENDECTOMY     BILIARY DILATION  02/09/2022   Procedure: BILIARY DILATION;  Surgeon: Lynann Bologna, MD;  Location: [facility] ENDOSCOPY;  Service: Gastroenterology;;   BLADDER NECK SUSPENSION  1985   CARDIAC  CATHETERIZATION     CHOLECYSTECTOMY N/A 08/24/2020   Procedure: LAPAROSCOPIC CHOLECYSTECTOMY;  Surgeon: Harriette Bouillon, MD;  Location: [facility] ORS;  Service: General;  Laterality: N/A;   ERCP N/A 02/09/2022   Procedure: ENDOSCOPIC RETROGRADE CHOLANGIOPANCREATOGRAPHY (ERCP);  Surgeon: Lynann Bologna, MD;  Location: [facility] ENDOSCOPY;  Service: Gastroenterology;  Laterality: N/A;   EYE SURGERY     gall stones     2023   HAND SURGERY     right "had knots cut out"   KNEE ARTHROPLASTY Right 12/09/2013   Procedure: COMPUTER ASSISTED Right TOTAL KNEE ARTHROPLASTY;  Surgeon: Eldred Manges, MD;  Location: [facility] OR;  Service: Orthopedics;  Laterality: Right;   LEFT HEART CATH AND CORONARY ANGIOGRAPHY N/A 08/15/2017   Procedure: LEFT HEART CATH AND CORONARY ANGIOGRAPHY;  Surgeon: Corky Crafts, MD;  Location: [facility] INVASIVE CV LAB;  Service: Cardiovascular;  Laterality: N/A;   REMOVAL OF STONES  02/09/2022   Procedure: REMOVAL OF STONES;  Surgeon: Lynann Bologna, MD;  Location: [facility] ENDOSCOPY;  Service: Gastroenterology;;   Dennison Mascot  02/09/2022   Procedure: Dennison Mascot;  Surgeon: Lynann Bologna, MD;  Location: [facility] ENDOSCOPY;  Service: Gastroenterology;;   TOTAL KNEE ARTHROPLASTY  Left 05/13/2020   Procedure: LEFT TOTAL KNEE ARTHROPLASTY;  Surgeon: Eldred Manges, MD;  Location: [facility] OR;  Service: Orthopedics;  Laterality: Left;   TUBAL LIGATION     Patient Active Problem List   Diagnosis Date Noted   Common bile duct (CBD) obstruction 02/09/2022   Acute on chronic diastolic CHF (congestive heart failure) (HCC) 08/24/2020   Acute cholecystitis 08/23/2020   Elevated troponin 08/23/2020   Arthritis of left knee 05/13/2020   S/P total knee arthroplasty, left 05/13/2020   History of total knee arthroplasty, right 03/31/2020   Precordial pain    Chest pain 08/13/2017   Mixed dyslipidemia 08/13/2017   CAD (coronary artery disease), native coronary artery 08/13/2017   Mild episode of recurrent major depressive  disorder (HCC) 11/03/2013   Urge incontinence 04/02/2013   Spinal stenosis, lumbar region, without neurogenic claudication 10/27/2012   Spinal stenosis of lumbar region 10/01/2012   Allergic rhinitis 09/26/2011   Dizziness 01/23/2011   Type II diabetes mellitus (HCC) 06/27/2010   Morbid obesity (HCC) 06/27/2010   Impaired glucose tolerance 06/27/2010   Disorder of bone and cartilage 06/22/2010   HYPERLIPIDEMIA-MIXED 03/07/2010   Essential hypertension 03/07/2010   CARDIOMEGALY 11/30/2009   OTHER DYSPNEA AND RESPIRATORY ABNORMALITIES 11/30/2009   AORTIC REGURGITATION 09/28/2009   Chronic diastolic heart failure (HCC) 09/28/2009   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   Osteoarthrosis involving multiple sites 10/26/2008    PCP: Porfirio Oar PA   REFERRING PROVIDER: Eldred Manges, MD  REFERRING DIAG:  Diagnosis  M62.81 (ICD-10-CM) - Quadriceps weakness    THERAPY DIAG:  Muscle weakness (generalized)  Difficulty in walking, not elsewhere classified  Stiffness of left knee, not elsewhere classified  Stiffness of right knee, not elsewhere classified  Rationale for Evaluation and Treatment: Rehabilitation  ONSET DATE: chronic   SUBJECTIVE:   SUBJECTIVE STATEMENT: Pt reporting no pain upon arrival. Pt still reporting some stiffness in her low back. Pt stating using her cane in her Rt UE has been going well but she has to remind herself at times to not use her left UE.   PERTINENT HISTORY: Anginal pain, anxiety, aortic insufficiency, CAD, CHF, DM, hx frozen shoulder, HLD, HTN, cardiomyopathy, obesity, OA, cardiac cath, B TKAs, hand surgery  PAIN:  NPRS scale: no pain upon arrival Pain location: back upon arrival  Pain description: achy, spasms Aggravating factors: walking/standing, sitting at times Relieving factors: nothing specific    PRECAUTIONS: None  WEIGHT BEARING RESTRICTIONS: No  FALLS:  Has patient fallen in last 6 months? No  LIVING  ENVIRONMENT: Lives with: lives with their spouse Lives in: House/apartment Stairs: has upstairs and downstairs in the house but can stay on first floor  Has following equipment at home: Single point cane, Walker - 2 wheeled, and bed side commode  OCCUPATION: retired   PLOF: Independent, Independent with basic ADLs, and Independent with household mobility with device  PATIENT GOALS: get away from cane, get stronger, address back pain   NEXT MD VISIT: Dr. Ophelia Charter 04/16/23  OBJECTIVE: (objective measures completed at initial evaluation unless otherwise dated)   DIAGNOSTIC FINDINGS: AP pelvis frog-leg left hip demonstrates some joint space narrowing with  small marginal osteophytes left hip more than right hip.   Impression: Mild left hip osteoarthritis.  AP and lateral lumbar spine images are obtained and reviewed.  Significant  to space narrowing L3-4 with facet arthropathy.  1 mm anterolisthesis  L4-5.  1 cm kidney stones noted right side.   Impression: Lumbar disc degeneration  narrowing L3-4 more than L4-5.  PATIENT SURVEYS:  02/01/2023 intake on 2nd visit: 41  predicted:45  01/28/2023 FOTO next visit pt late  COGNITION: 01/28/2023 Overall cognitive status: Within functional limits for tasks assessed     SENSATION: 01/28/2023 Not tested  PALPATION: 01/28/2023 Rt lumbar paraspinals tight and tender  LOWER EXTREMITY ROM:  Active ROM  Seated at edge of mat table  Right 01/28/2023 Left 01/28/2023 Rt / Left   Hip flexion     Hip extension     Hip abduction     Hip adduction     Hip internal rotation     Hip external rotation     Knee flexion 92* 88* 94 / 92  Knee extension -3* 2* -2 / 0  Ankle dorsiflexion     Ankle plantarflexion     Ankle inversion     Ankle eversion      (Blank rows = not tested)  LOWER EXTREMITY MMT:  MMT Right 01/28/2023 Left 01/28/2023 Rt / Left  Hip flexion 2+ 2+ 3  /  3   Hip extension     Hip abduction     Hip adduction     Hip internal  rotation     Hip external rotation     Knee flexion 4+ 3+ 5  /  4  Knee extension 4+ 3 4+  /  4-  Ankle dorsiflexion 5 3  5    /  4   Ankle plantarflexion     Ankle inversion     Ankle eversion      (Blank rows = not tested)    FUNCTIONAL TESTS:  02/14/2023:  TUG c SPC:  12.7 seconds  02/01/2023: TUG c SPC 18.5 seconds   01/28/2023 Timed up and go (TUG): next visit   GAIT: 01/28/2023 Distance walked: in clinic distances  Assistive device utilized: Single point cane Level of assistance: Modified independence Comments: short step lengths/stance times, tends to shuffle a bit, generally unsteady                    TODAY'S TREATMENT:                                                                          DATE: 02/26/2023 Therex Nustep: Level 6 x 8 minutes UE/LE Calf stretch on slant board x 2 holding 30 sec Standing hamstring curls x 10 bil LE Standing hip flexion 2 x 10 c UE support Standing hip abduction 2 x 10 c UE support Sit to stand: x 10  Side stepping in parallel bars x 3 each direction c UE support LAQ: 2 x 10 c 2# bil LE Supine trunk rotation x 3 bil holding 30 sec Supine bridge: x 10 holding 3 sec    TODAY'S TREATMENT:                                                                          DATE:  02/19/2023 Therex Recumbent bike: rocking back and forth x 5 minutes Standing calf raises: x 20 c UE support Standing hip flexion x 20 c UE support Standing hip abduction x 20 c UE support Sit to stand: x 10  Supine bridges: 2 x 10  Supine SLR: 2 x 10 bil  Supine clam shells red TB x 20 holding 3 sec Amb clinic distances straight cane, instructions to improve her step length. Pt's cane was lowered one notch to decrease shoulder hiking. Pt was also instructed to use her cane in her Rt UE since her left quad is weaker.     PATIENT EDUCATION:  01/28/2023 Education details: rationale for interventions, HEP  Person educated: Patient Education method: Explanation,  Demonstration, and Handouts Education comprehension: verbalized understanding, returned demonstration, and needs further education  HOME EXERCISE PROGRAM: Access Code: [email or web address] URL: [email or web address] Date: 02/04/2023 Prepared by: Fransisco Hertz  Exercises - Sitting Knee Extension with Resistance  - 1-2 x daily - 7 x weekly - 1 sets - 10 reps - 2 seconds  hold - Seated Hamstring Curl with Anchored Resistance  - 1-2 x daily - 7 x weekly - 1 sets - 10 reps - 2 seconds  hold - Seated Hip Abduction with Resistance  - 1-2 x daily - 7 x weekly - 1 sets - 10 reps - 2 seconds  hold - Supine Lower Trunk Rotation  - 2-3 x daily - 7 x weekly - 1 sets - 3-5 reps - 15 hold - Seated Quad Set  - 3-5 x daily - 7 x weekly - 1 sets - 10 reps - 5 hold - Standing March with Counter Support  - 2-3 x daily - 7 x weekly - 1 sets - 10 reps  ASSESSMENT:  CLINICAL IMPRESSION: Pt arriving today reporting no pain only stiffness at times in her low back. Pt tolerating all exercises well with muscle fatigue reported and rest breaks allowed during each set. Pt reporting she is adjusting to using her straight cane in her Rt UE. Continue skilled PT interventions progressing toward LTG's set.    OBJECTIVE IMPAIRMENTS: Abnormal gait, decreased activity tolerance, decreased balance, decreased coordination, decreased endurance, decreased mobility, difficulty walking, decreased ROM, decreased strength, impaired flexibility, impaired UE functional use, obesity, and pain.   ACTIVITY LIMITATIONS: standing, squatting, stairs, transfers, and locomotion level  PARTICIPATION LIMITATIONS: driving, shopping, community activity, and church  PERSONAL FACTORS: Age, Behavior pattern, Education, Fitness, Past/current experiences, Social background, and Time since onset of injury/illness/exacerbation are also affecting patient's functional outcome.   REHAB POTENTIAL: Fair chronicity of impairments  CLINICAL DECISION  MAKING: Stable/uncomplicated  EVALUATION COMPLEXITY: Low   GOALS: Goals reviewed with patient? Yes  SHORT TERM GOALS: Target date: 02/18/2023   Will be compliant with appropriate progressive HEP  Baseline: Goal status: MET 02/19/23  2.  Back pain to have improved by 50%  Baseline:  Goal status: Not MET 02/19/2023, pt stating it has improved about 25%  3.  Will be able to ambulate household distances with improved stance/step times/lengths and no unsteadiness Baseline:  Goal status:  MET 02/19/23, using straight cane    LONG TERM GOALS: Target date: 03/11/2023    MMT to improve by 1 grade all weak groups  Baseline:  Goal status: INITIAL  2.  Will complete TUG in 15 seconds or less no device  Baseline:  Goal status: INITIAL  3.  Back pain to have improved by 75%  Baseline:  Goal status: INITIAL  4.  Will  be able to ambulate at least household distances no device no unsteadiness  Baseline:  Goal status: INITIAL  5.  Knee flexion AROM to improve by 10 degrees B  Baseline:  Goal status: INITIAL       PLAN:  PT FREQUENCY: 2x/week  PT DURATION: 6 weeks  PLANNED INTERVENTIONS: Therapeutic exercises, Therapeutic activity, Neuromuscular re-education, Balance training, Gait training, Patient/Family education, Self Care, Joint mobilization, Stair training, DME instructions, Aquatic Therapy, Dry Needling, Electrical stimulation, Spinal mobilization, Cryotherapy, Moist heat, Taping, Ultrasound, Ionotophoresis 4mg /ml Dexamethasone, Manual therapy, and Re-evaluation  PLAN FOR NEXT SESSION:  Continue to try to improve lumbar mobility/symptoms to promote ability to perform WB strengthening as tolerated focusing on quad and hip strengthening.     Narda Amber, PT,MPT 02/26/23 12:02 PM   02/26/23  12:02 PM

## 2023-02-28 ENCOUNTER — Encounter: Payer: Medicare Other | Admitting: Rehabilitative and Restorative Service Providers"

## 2023-03-07 ENCOUNTER — Encounter: Payer: Self-pay | Admitting: Rehabilitative and Restorative Service Providers"

## 2023-03-07 ENCOUNTER — Ambulatory Visit (INDEPENDENT_AMBULATORY_CARE_PROVIDER_SITE_OTHER): Payer: Medicare Other | Admitting: Rehabilitative and Restorative Service Providers"

## 2023-03-07 DIAGNOSIS — M25661 Stiffness of right knee, not elsewhere classified: Secondary | ICD-10-CM

## 2023-03-07 DIAGNOSIS — R2681 Unsteadiness on feet: Secondary | ICD-10-CM

## 2023-03-07 DIAGNOSIS — R262 Difficulty in walking, not elsewhere classified: Secondary | ICD-10-CM

## 2023-03-07 DIAGNOSIS — M25662 Stiffness of left knee, not elsewhere classified: Secondary | ICD-10-CM | POA: Diagnosis not present

## 2023-03-07 DIAGNOSIS — M6281 Muscle weakness (generalized): Secondary | ICD-10-CM

## 2023-03-07 NOTE — Therapy (Signed)
OUTPATIENT PHYSICAL THERAPY TREATMENT   Patient Name: Julie Graves MRN: [id number] DOB:[birth year], 82 y.o., female Today's Date: 03/07/2023  END OF SESSION:  PT End of Session - 03/07/23 1102     Visit Number 7    Number of Visits 13    Date for PT Re-Evaluation 03/11/23    Authorization Type [employer]    Authorization Time Period 02/26/2023 - 04/09/2023 12 visits    Authorization - Visit Number 2    Authorization - Number of Visits 12    Progress Note Due on Visit 10    PT Start Time 1059    PT Stop Time 1138    PT Time Calculation (min) 39 min    Activity Tolerance Patient tolerated treatment well    Behavior During Therapy [facility] for tasks assessed/performed                 Past Medical History:  Diagnosis Date   Anginal pain (HCC)    "small pain?nerves in chest"started 10/05/13    Anginal pain (HCC)    cleared by cardiology 3/15   Anxiety    rx given recent not taken yet   Aortic insufficiency    mild on 9/11 cath   CAD (coronary artery disease)    nonobstructive let heart cath 3/11: 40% ostial D1, 30% mid CFX   CHF (congestive heart failure) (HCC)    Colon polyps 02/01/2009    MULTIPLE FRAGMENTS OF TUBULAR ADENOMAS   Diabetes mellitus    Frozen shoulder    GERD (gastroesophageal reflux disease)    occ   History of blood transfusion    pregnancy   HLD (hyperlipidemia)    HTN (hypertension)    ACEI cough   Hx of cardiovascular stress test    Lexiscan Myoview (10/2013):  Fixed ant defect most c/w breast attenuation, cannot exclude apical infarct; no ischemia, EF 46%; Low Risk   Nonischemic cardiomyopathy (HCC)    Ech 3/11 difficult study, moderate aortic insuficiency noted. Left evntriculogram 3/11   Obese    Osteoarthritis    Palpitation    PACs noted on telemetry while in the hospital   Pneumonia    hx   Stress incontinence    Past Surgical History:  Procedure Laterality Date   ABDOMINAL HYSTERECTOMY     APPENDECTOMY     BILIARY DILATION   02/09/2022   Procedure: BILIARY DILATION;  Surgeon: Lynann Bologna, MD;  Location: [facility] ENDOSCOPY;  Service: Gastroenterology;;   BLADDER NECK SUSPENSION  1985   CARDIAC CATHETERIZATION     CHOLECYSTECTOMY N/A 08/24/2020   Procedure: LAPAROSCOPIC CHOLECYSTECTOMY;  Surgeon: Harriette Bouillon, MD;  Location: [facility] ORS;  Service: General;  Laterality: N/A;   ERCP N/A 02/09/2022   Procedure: ENDOSCOPIC RETROGRADE CHOLANGIOPANCREATOGRAPHY (ERCP);  Surgeon: Lynann Bologna, MD;  Location: [facility] ENDOSCOPY;  Service: Gastroenterology;  Laterality: N/A;   EYE SURGERY     gall stones     2023   HAND SURGERY     right "had knots cut out"   KNEE ARTHROPLASTY Right 12/09/2013   Procedure: COMPUTER ASSISTED Right TOTAL KNEE ARTHROPLASTY;  Surgeon: Eldred Manges, MD;  Location: [facility] OR;  Service: Orthopedics;  Laterality: Right;   LEFT HEART CATH AND CORONARY ANGIOGRAPHY N/A 08/15/2017   Procedure: LEFT HEART CATH AND CORONARY ANGIOGRAPHY;  Surgeon: Corky Crafts, MD;  Location: [facility] INVASIVE CV LAB;  Service: Cardiovascular;  Laterality: N/A;   REMOVAL OF STONES  02/09/2022   Procedure: REMOVAL OF STONES;  Surgeon:  Lynann Bologna, MD;  Location: [facility] ENDOSCOPY;  Service: Gastroenterology;;   Dennison Mascot  02/09/2022   Procedure: Dennison Mascot;  Surgeon: Lynann Bologna, MD;  Location: [facility] ENDOSCOPY;  Service: Gastroenterology;;   TOTAL KNEE ARTHROPLASTY Left 05/13/2020   Procedure: LEFT TOTAL KNEE ARTHROPLASTY;  Surgeon: Eldred Manges, MD;  Location: [facility] OR;  Service: Orthopedics;  Laterality: Left;   TUBAL LIGATION     Patient Active Problem List   Diagnosis Date Noted   Common bile duct (CBD) obstruction 02/09/2022   Acute on chronic diastolic CHF (congestive heart failure) (HCC) 08/24/2020   Acute cholecystitis 08/23/2020   Elevated troponin 08/23/2020   Arthritis of left knee 05/13/2020   S/P total knee arthroplasty, left 05/13/2020   History of total knee arthroplasty, right 03/31/2020   Precordial pain     Chest pain 08/13/2017   Mixed dyslipidemia 08/13/2017   CAD (coronary artery disease), native coronary artery 08/13/2017   Mild episode of recurrent major depressive disorder (HCC) 11/03/2013   Urge incontinence 04/02/2013   Spinal stenosis, lumbar region, without neurogenic claudication 10/27/2012   Spinal stenosis of lumbar region 10/01/2012   Allergic rhinitis 09/26/2011   Dizziness 01/23/2011   Type II diabetes mellitus (HCC) 06/27/2010   Morbid obesity (HCC) 06/27/2010   Impaired glucose tolerance 06/27/2010   Disorder of bone and cartilage 06/22/2010   HYPERLIPIDEMIA-MIXED 03/07/2010   Essential hypertension 03/07/2010   CARDIOMEGALY 11/30/2009   OTHER DYSPNEA AND RESPIRATORY ABNORMALITIES 11/30/2009   AORTIC REGURGITATION 09/28/2009   Chronic diastolic heart failure (HCC) 09/28/2009   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   Osteoarthrosis involving multiple sites 10/26/2008    PCP: Porfirio Oar PA   REFERRING PROVIDER: Eldred Manges, MD  REFERRING DIAG:  Diagnosis  M62.81 (ICD-10-CM) - Quadriceps weakness    THERAPY DIAG:  Muscle weakness (generalized)  Difficulty in walking, not elsewhere classified  Stiffness of left knee, not elsewhere classified  Stiffness of right knee, not elsewhere classified  Unsteadiness on feet  Rationale for Evaluation and Treatment: Rehabilitation  ONSET DATE: chronic   SUBJECTIVE:   SUBJECTIVE STATEMENT: Pt reporting no pain upon arrival. Pt still reporting some stiffness in her low back. Pt stating using her cane in her Rt UE has been going well but she has to remind herself at times to not use her left UE.   PERTINENT HISTORY: Anginal pain, anxiety, aortic insufficiency, CAD, CHF, DM, hx frozen shoulder, HLD, HTN, cardiomyopathy, obesity, OA, cardiac cath, B TKAs, hand surgery  PAIN:  NPRS scale: no pain upon arrival Pain location: back upon arrival  Pain description: achy, spasms Aggravating factors:  walking/standing, sitting at times Relieving factors: nothing specific    PRECAUTIONS: None  WEIGHT BEARING RESTRICTIONS: No  FALLS:  Has patient fallen in last 6 months? No  LIVING ENVIRONMENT: Lives with: lives with their spouse Lives in: House/apartment Stairs: has upstairs and downstairs in the house but can stay on first floor  Has following equipment at home: Single point cane, Walker - 2 wheeled, and bed side commode  OCCUPATION: retired   PLOF: Independent, Independent with basic ADLs, and Independent with household mobility with device  PATIENT GOALS: get away from cane, get stronger, address back pain   NEXT MD VISIT: Dr. Ophelia Charter 04/16/23  OBJECTIVE: (objective measures completed at initial evaluation unless otherwise dated)   DIAGNOSTIC FINDINGS: AP pelvis frog-leg left hip demonstrates some joint space narrowing with  small marginal osteophytes left hip more than right hip.   Impression: Mild left hip osteoarthritis.  AP and lateral lumbar spine images are obtained and reviewed.  Significant  to space narrowing L3-4 with facet arthropathy.  1 mm anterolisthesis  L4-5.  1 cm kidney stones noted right side.   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 more than L4-5.  PATIENT SURVEYS:  03/07/2023:  FOTO update 53  02/01/2023 intake on 2nd visit: 41  predicted:45  01/28/2023 FOTO next visit pt late  COGNITION: 01/28/2023 Overall cognitive status: Within functional limits for tasks assessed     SENSATION: 01/28/2023 Not tested  PALPATION: 01/28/2023 Rt lumbar paraspinals tight and tender  LOWER EXTREMITY ROM:  Active ROM  Seated at edge of mat table  Right 01/28/2023 Left 01/28/2023 Rt / Left  02/26/2023  Hip flexion     Hip extension     Hip abduction     Hip adduction     Hip internal rotation     Hip external rotation     Knee flexion 92* 88* 94 / 92  Knee extension -3* 2* -2 / 0  Ankle dorsiflexion     Ankle plantarflexion     Ankle  inversion     Ankle eversion      (Blank rows = not tested)  LOWER EXTREMITY MMT:  MMT Right 01/28/2023 Left 01/28/2023 Rt / Left 02/26/2023  Hip flexion 2+ 2+ 3  /  3   Hip extension     Hip abduction     Hip adduction     Hip internal rotation     Hip external rotation     Knee flexion 4+ 3+ 5  /  4  Knee extension 4+ 3 4+  /  4-  Ankle dorsiflexion 5 3  5    /  4   Ankle plantarflexion     Ankle inversion     Ankle eversion      (Blank rows = not tested)    FUNCTIONAL TESTS:  02/14/2023:  TUG c SPC:  12.7 seconds  02/01/2023: TUG c SPC 18.5 seconds   01/28/2023 Timed up and go (TUG): next visit   GAIT: 01/28/2023 Distance walked: in clinic distances  Assistive device utilized: Single point cane Level of assistance: Modified independence Comments: short step lengths/stance times, tends to shuffle a bit, generally unsteady                     TODAY'S TREATMENT:                                                                          DATE: 03/07/2023 Therex Nustep: Lvl 5 10 mins UE/LE  Leg press double leg 62 lbs x 15, single leg 31 lbs x 15 bilateral  Lateral stepping in // bars 10 ft x 3 each way Standing hip extension x 15 bilateral with hands on bar Standing hip abduction x 15 bilateral with hands on bar Calf stretch on slant board x 3 holding 30 sec Supine lumbar trunk rotation 15 sec x 3 bilateral  Supine bridge x 15 2-3 sec hold     TODAY'S TREATMENT:  DATE: 02/26/2023 Therex Nustep: Level 6 x 8 minutes UE/LE Calf stretch on slant board x 2 holding 30 sec Standing hamstring curls x 10 bil LE Standing hip flexion 2 x 10 c UE support Standing hip abduction 2 x 10 c UE support Sit to stand: x 10  Side stepping in parallel bars x 3 each direction c UE support LAQ: 2 x 10 c 2# bil LE Supine trunk rotation x 3 bil holding 30 sec Supine bridge: x 10 holding 3 sec    TODAY'S TREATMENT:                                                                           DATE: 02/19/2023 Therex Recumbent bike: rocking back and forth x 5 minutes Standing calf raises: x 20 c UE support Standing hip flexion x 20 c UE support Standing hip abduction x 20 c UE support Sit to stand: x 10  Supine bridges: 2 x 10  Supine SLR: 2 x 10 bil  Supine clam shells red TB x 20 holding 3 sec Amb clinic distances straight cane, instructions to improve her step length. Pt's cane was lowered one notch to decrease shoulder hiking. Pt was also instructed to use her cane in her Rt UE since her left quad is weaker.     PATIENT EDUCATION:  01/28/2023 Education details: rationale for interventions, HEP  Person educated: Patient Education method: Explanation, Demonstration, and Handouts Education comprehension: verbalized understanding, returned demonstration, and needs further education  HOME EXERCISE PROGRAM: Access Code: [email or web address] URL: [email or web address] Date: 02/04/2023 Prepared by: Fransisco Hertz  Exercises - Sitting Knee Extension with Resistance  - 1-2 x daily - 7 x weekly - 1 sets - 10 reps - 2 seconds  hold - Seated Hamstring Curl with Anchored Resistance  - 1-2 x daily - 7 x weekly - 1 sets - 10 reps - 2 seconds  hold - Seated Hip Abduction with Resistance  - 1-2 x daily - 7 x weekly - 1 sets - 10 reps - 2 seconds  hold - Supine Lower Trunk Rotation  - 2-3 x daily - 7 x weekly - 1 sets - 3-5 reps - 15 hold - Seated Quad Set  - 3-5 x daily - 7 x weekly - 1 sets - 10 reps - 5 hold - Standing March with Counter Support  - 2-3 x daily - 7 x weekly - 1 sets - 10 reps  ASSESSMENT:  CLINICAL IMPRESSION: FOTO update showed improvement compared to evaluation.  Pt continued to report difficulty and limit in ability to walk longer distances and having to use the cane.  Pt to benefit from goal of transitioning towards HEP usage and gym return.  Pt voiced concern about cost of visits.  Hope to  continue gains to allow transition when appropriate.    OBJECTIVE IMPAIRMENTS: Abnormal gait, decreased activity tolerance, decreased balance, decreased coordination, decreased endurance, decreased mobility, difficulty walking, decreased ROM, decreased strength, impaired flexibility, impaired UE functional use, obesity, and pain.   ACTIVITY LIMITATIONS: standing, squatting, stairs, transfers, and locomotion level  PARTICIPATION LIMITATIONS: driving, shopping, community activity, and church  PERSONAL FACTORS: Age, Behavior pattern, Education, Fitness, Past/current experiences, Social background, and Time since onset  of injury/illness/exacerbation are also affecting patient's functional outcome.   REHAB POTENTIAL: Fair chronicity of impairments  CLINICAL DECISION MAKING: Stable/uncomplicated  EVALUATION COMPLEXITY: Low   GOALS: Goals reviewed with patient? Yes  SHORT TERM GOALS: Target date: 02/18/2023   Will be compliant with appropriate progressive HEP  Baseline: Goal status: MET 02/19/23  2.  Back pain to have improved by 50%  Baseline:  Goal status: Not MET 02/19/2023, pt stating it has improved about 25%  3.  Will be able to ambulate household distances with improved stance/step times/lengths and no unsteadiness Baseline:  Goal status:  MET 02/19/23, using straight cane    LONG TERM GOALS: Target date: 03/11/2023    MMT to improve by 1 grade all weak groups  Baseline:  Goal status: INITIAL  2.  Will complete TUG in 15 seconds or less no device  Baseline:  Goal status: INITIAL  3.  Back pain to have improved by 75%  Baseline:  Goal status: INITIAL  4.  Will be able to ambulate at least household distances no device no unsteadiness  Baseline:  Goal status: INITIAL  5.  Knee flexion AROM to improve by 10 degrees B  Baseline:  Goal status: INITIAL       PLAN:  PT FREQUENCY: 2x/week  PT DURATION: 6 weeks  PLANNED INTERVENTIONS: Therapeutic exercises,  Therapeutic activity, Neuromuscular re-education, Balance training, Gait training, Patient/Family education, Self Care, Joint mobilization, Stair training, DME instructions, Aquatic Therapy, Dry Needling, Electrical stimulation, Spinal mobilization, Cryotherapy, Moist heat, Taping, Ultrasound, Ionotophoresis 4mg /ml Dexamethasone, Manual therapy, and Re-evaluation  PLAN FOR NEXT SESSION:  Continue to try to improve lumbar mobility/symptoms to promote ability to perform WB strengthening as tolerated focusing on quad and hip strengthening.     02/26/2023-04/09/2023 - 12 visits approved by [facility]   Chyrel Masson, PT, DPT, OCS, ATC 03/07/23  11:33 AM

## 2023-03-14 ENCOUNTER — Encounter: Payer: Self-pay | Admitting: Rehabilitative and Restorative Service Providers"

## 2023-03-14 ENCOUNTER — Ambulatory Visit: Payer: Medicare Other | Admitting: Rehabilitative and Restorative Service Providers"

## 2023-03-14 DIAGNOSIS — M25662 Stiffness of left knee, not elsewhere classified: Secondary | ICD-10-CM | POA: Diagnosis not present

## 2023-03-14 DIAGNOSIS — M6281 Muscle weakness (generalized): Secondary | ICD-10-CM

## 2023-03-14 DIAGNOSIS — R262 Difficulty in walking, not elsewhere classified: Secondary | ICD-10-CM

## 2023-03-14 DIAGNOSIS — M25661 Stiffness of right knee, not elsewhere classified: Secondary | ICD-10-CM | POA: Diagnosis not present

## 2023-03-14 DIAGNOSIS — R2681 Unsteadiness on feet: Secondary | ICD-10-CM | POA: Diagnosis not present

## 2023-03-14 NOTE — Therapy (Signed)
OUTPATIENT PHYSICAL THERAPY TREATMENT /RECERTIFICATION [facility] NOTE   Patient Name: Julie Graves MRN: [id number] DOB:[birth year], 82 y.o., female Today's Date: 03/14/2023  Progress Note Reporting Period 02/28/2023 to 03/14/2023  See note below for Objective Data and Assessment of Progress/Goals.      END OF SESSION:  PT End of Session - 03/14/23 1156     Visit Number 8    Number of Visits 10    Date for PT Re-Evaluation 03/28/23    Authorization Type [employer]    Authorization Time Period 02/26/2023 - 04/09/2023 12 visits    Authorization - Visit Number 3    Authorization - Number of Visits 12    Progress Note Due on Visit 20    PT Start Time 1151    PT Stop Time 1229    PT Time Calculation (min) 38 min    Activity Tolerance Patient tolerated treatment well    Behavior During Therapy WFL for tasks assessed/performed                  Past Medical History:  Diagnosis Date   Anginal pain (HCC)    "small pain?nerves in chest"started 10/05/13    Anginal pain (HCC)    cleared by cardiology 3/15   Anxiety    rx given recent not taken yet   Aortic insufficiency    mild on 9/11 cath   CAD (coronary artery disease)    nonobstructive let heart cath 3/11: 40% ostial D1, 30% mid CFX   CHF (congestive heart failure) (HCC)    Colon polyps 02/01/2009    MULTIPLE FRAGMENTS OF TUBULAR ADENOMAS   Diabetes mellitus    Frozen shoulder    GERD (gastroesophageal reflux disease)    occ   History of blood transfusion    pregnancy   HLD (hyperlipidemia)    HTN (hypertension)    ACEI cough   Hx of cardiovascular stress test    Lexiscan Myoview (10/2013):  Fixed ant defect most c/w breast attenuation, cannot exclude apical infarct; no ischemia, EF 46%; Low Risk   Nonischemic cardiomyopathy (HCC)    Ech 3/11 difficult study, moderate aortic insuficiency noted. Left evntriculogram 3/11   Obese    Osteoarthritis    Palpitation    PACs noted on telemetry while in the  hospital   Pneumonia    hx   Stress incontinence    Past Surgical History:  Procedure Laterality Date   ABDOMINAL HYSTERECTOMY     APPENDECTOMY     BILIARY DILATION  02/09/2022   Procedure: BILIARY DILATION;  Surgeon: Lynann Bologna, MD;  Location: [facility] ENDOSCOPY;  Service: Gastroenterology;;   BLADDER NECK SUSPENSION  1985   CARDIAC CATHETERIZATION     CHOLECYSTECTOMY N/A 08/24/2020   Procedure: LAPAROSCOPIC CHOLECYSTECTOMY;  Surgeon: Harriette Bouillon, MD;  Location: [facility] ORS;  Service: General;  Laterality: N/A;   ERCP N/A 02/09/2022   Procedure: ENDOSCOPIC RETROGRADE CHOLANGIOPANCREATOGRAPHY (ERCP);  Surgeon: Lynann Bologna, MD;  Location: [facility] ENDOSCOPY;  Service: Gastroenterology;  Laterality: N/A;   EYE SURGERY     gall stones     2023   HAND SURGERY     right "had knots cut out"   KNEE ARTHROPLASTY Right 12/09/2013   Procedure: COMPUTER ASSISTED Right TOTAL KNEE ARTHROPLASTY;  Surgeon: Eldred Manges, MD;  Location: [facility] OR;  Service: Orthopedics;  Laterality: Right;   LEFT HEART CATH AND CORONARY ANGIOGRAPHY N/A 08/15/2017   Procedure: LEFT HEART CATH AND CORONARY ANGIOGRAPHY;  Surgeon: Corky Crafts, MD;  Location: [facility] INVASIVE CV LAB;  Service: Cardiovascular;  Laterality: N/A;   REMOVAL OF STONES  02/09/2022   Procedure: REMOVAL OF STONES;  Surgeon: Lynann Bologna, MD;  Location: [facility] ENDOSCOPY;  Service: Gastroenterology;;   Dennison Mascot  02/09/2022   Procedure: Dennison Mascot;  Surgeon: Lynann Bologna, MD;  Location: [facility] ENDOSCOPY;  Service: Gastroenterology;;   TOTAL KNEE ARTHROPLASTY Left 05/13/2020   Procedure: LEFT TOTAL KNEE ARTHROPLASTY;  Surgeon: Eldred Manges, MD;  Location: [facility] OR;  Service: Orthopedics;  Laterality: Left;   TUBAL LIGATION     Patient Active Problem List   Diagnosis Date Noted   Common bile duct (CBD) obstruction 02/09/2022   Acute on chronic diastolic CHF (congestive heart failure) (HCC) 08/24/2020   Acute cholecystitis 08/23/2020   Elevated  troponin 08/23/2020   Arthritis of left knee 05/13/2020   S/P total knee arthroplasty, left 05/13/2020   History of total knee arthroplasty, right 03/31/2020   Precordial pain    Chest pain 08/13/2017   Mixed dyslipidemia 08/13/2017   CAD (coronary artery disease), native coronary artery 08/13/2017   Mild episode of recurrent major depressive disorder (HCC) 11/03/2013   Urge incontinence 04/02/2013   Spinal stenosis, lumbar region, without neurogenic claudication 10/27/2012   Spinal stenosis of lumbar region 10/01/2012   Allergic rhinitis 09/26/2011   Dizziness 01/23/2011   Type II diabetes mellitus (HCC) 06/27/2010   Morbid obesity (HCC) 06/27/2010   Impaired glucose tolerance 06/27/2010   Disorder of bone and cartilage 06/22/2010   HYPERLIPIDEMIA-MIXED 03/07/2010   Essential hypertension 03/07/2010   CARDIOMEGALY 11/30/2009   OTHER DYSPNEA AND RESPIRATORY ABNORMALITIES 11/30/2009   AORTIC REGURGITATION 09/28/2009   Chronic diastolic heart failure (HCC) 09/28/2009   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   Osteoarthrosis involving multiple sites 10/26/2008    PCP: Porfirio Oar PA   REFERRING PROVIDER: Eldred Manges, MD  REFERRING DIAG:  Diagnosis  M62.81 (ICD-10-CM) - Quadriceps weakness    THERAPY DIAG:  Muscle weakness (generalized) - Plan: PT plan of care cert/re-cert  Difficulty in walking, not elsewhere classified - Plan: PT plan of care cert/re-cert  Stiffness of left knee, not elsewhere classified - Plan: PT plan of care cert/re-cert  Stiffness of right knee, not elsewhere classified - Plan: PT plan of care cert/re-cert  Unsteadiness on feet - Plan: PT plan of care cert/re-cert  Rationale for Evaluation and Treatment: Rehabilitation  ONSET DATE: chronic   SUBJECTIVE:   SUBJECTIVE STATEMENT: No pain upon arrival.  Reported she wanted today and next visit and then get to HEP/gym.    PERTINENT HISTORY: Anginal pain, anxiety, aortic insufficiency, CAD,  CHF, DM, hx frozen shoulder, HLD, HTN, cardiomyopathy, obesity, OA, cardiac cath, B TKAs, hand surgery  PAIN:  NPRS scale: no pain upon arrival Pain location:  Pain description: achy, spasms Aggravating factors: walking/standing, sitting at times Relieving factors: nothing specific    PRECAUTIONS: None  WEIGHT BEARING RESTRICTIONS: No  FALLS:  Has patient fallen in last 6 months? No  LIVING ENVIRONMENT: Lives with: lives with their spouse Lives in: House/apartment Stairs: has upstairs and downstairs in the house but can stay on first floor  Has following equipment at home: Single point cane, Walker - 2 wheeled, and bed side commode  OCCUPATION: retired   PLOF: Independent, Independent with basic ADLs, and Independent with household mobility with device  PATIENT GOALS: get away from cane, get stronger, address back pain   NEXT MD VISIT: Dr. Ophelia Charter 04/16/23  OBJECTIVE: (objective measures completed at initial evaluation unless otherwise  dated)   DIAGNOSTIC FINDINGS: AP pelvis frog-leg left hip demonstrates some joint space narrowing with  small marginal osteophytes left hip more than right hip.   Impression: Mild left hip osteoarthritis.  AP and lateral lumbar spine images are obtained and reviewed.  Significant  to space narrowing L3-4 with facet arthropathy.  1 mm anterolisthesis  L4-5.  1 cm kidney stones noted right side.   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 more than L4-5.  PATIENT SURVEYS:  03/07/2023:  FOTO update 53  02/01/2023 intake on 2nd visit: 41  predicted:45  01/28/2023 FOTO next visit pt late  COGNITION: 01/28/2023 Overall cognitive status: Within functional limits for tasks assessed     SENSATION: 01/28/2023 Not tested  PALPATION: 01/28/2023 Rt lumbar paraspinals tight and tender  LOWER EXTREMITY ROM:  Active ROM  Seated at edge of mat table  Right 01/28/2023 Left 01/28/2023 Rt / Left  02/26/2023  Hip flexion     Hip extension      Hip abduction     Hip adduction     Hip internal rotation     Hip external rotation     Knee flexion 92* 88* 94 / 92  Knee extension -3* 2* -2 / 0  Ankle dorsiflexion     Ankle plantarflexion     Ankle inversion     Ankle eversion      (Blank rows = not tested)  LOWER EXTREMITY MMT:  MMT Right 01/28/2023 Left 01/28/2023 Rt / Left 02/26/2023 Right 03/14/2023 Left 03/14/2023  Hip flexion 2+ 2+ 3  /  3  5/5 5/5  Hip extension       Hip abduction       Hip adduction       Hip internal rotation       Hip external rotation       Knee flexion 4+ 3+ 5  /  4 5/5 5/5  Knee extension 4+ 3 4+  /  4- 5/5 5/5  Ankle dorsiflexion 5 3  5    /  4     Ankle plantarflexion       Ankle inversion       Ankle eversion        (Blank rows = not tested)    FUNCTIONAL TESTS:  02/14/2023:  TUG c SPC:  12.7 seconds  02/01/2023: TUG c SPC 18.5 seconds   01/28/2023 Timed up and go (TUG): next visit   GAIT: 01/28/2023 Distance walked: in clinic distances  Assistive device utilized: Single point cane Level of assistance: Modified independence Comments: short step lengths/stance times, tends to shuffle a bit, generally unsteady                     TODAY'S TREATMENT:                                                                          DATE: 03/14/2023 Therex Nustep: Lvl 5 10 mins UE/LE  Standing hip extension x 15 bilateral with hands on bar Standing hip abduction x 15 bilateral with hands on bar Sit to stand to sit 18 inch chair 2 x 10  Calf stretch on slant board x 3 holding 30 sec   Neuro  Re-ed Feet close together on foam 1 min with SBA Tandem stance on foam 1 min x 1 bilaterally with SBA with occasional HHA on bar Alternating heel/toe lifts 2 x 10 c SBA  TODAY'S TREATMENT:                                                                          DATE: 03/07/2023 Therex Nustep: Lvl 5 10 mins UE/LE  Leg press double leg 62 lbs x 15, single leg 31 lbs x 15 bilateral  Lateral stepping in //  bars 10 ft x 3 each way Standing hip extension x 15 bilateral with hands on bar Standing hip abduction x 15 bilateral with hands on bar Calf stretch on slant board x 3 holding 30 sec Supine lumbar trunk rotation 15 sec x 3 bilateral  Supine bridge x 15 2-3 sec hold     TODAY'S TREATMENT:                                                                          DATE: 02/26/2023 Therex Nustep: Level 6 x 8 minutes UE/LE Calf stretch on slant board x 2 holding 30 sec Standing hamstring curls x 10 bil LE Standing hip flexion 2 x 10 c UE support Standing hip abduction 2 x 10 c UE support Sit to stand: x 10  Side stepping in parallel bars x 3 each direction c UE support LAQ: 2 x 10 c 2# bil LE Supine trunk rotation x 3 bil holding 30 sec Supine bridge: x 10 holding 3 sec   PATIENT EDUCATION:  01/28/2023 Education details: rationale for interventions, HEP  Person educated: Patient Education method: Programmer, multimedia, Demonstration, and Handouts Education comprehension: verbalized understanding, returned demonstration, and needs further education  HOME EXERCISE PROGRAM: Access Code: [email or web address] URL: [email or web address] Date: 02/04/2023 Prepared by: Fransisco Hertz  Exercises - Sitting Knee Extension with Resistance  - 1-2 x daily - 7 x weekly - 1 sets - 10 reps - 2 seconds  hold - Seated Hamstring Curl with Anchored Resistance  - 1-2 x daily - 7 x weekly - 1 sets - 10 reps - 2 seconds  hold - Seated Hip Abduction with Resistance  - 1-2 x daily - 7 x weekly - 1 sets - 10 reps - 2 seconds  hold - Supine Lower Trunk Rotation  - 2-3 x daily - 7 x weekly - 1 sets - 3-5 reps - 15 hold - Seated Quad Set  - 3-5 x daily - 7 x weekly - 1 sets - 10 reps - 5 hold - Standing March with Counter Support  - 2-3 x daily - 7 x weekly - 1 sets - 10 reps  ASSESSMENT:  CLINICAL IMPRESSION: Fair to good performance on static balance intervention in clinic today.  Pt to continue to benefit from  progressive strengthening and balance improvements with goal of next visit setting up trial HEP per Pt. Request.    OBJECTIVE  IMPAIRMENTS: Abnormal gait, decreased activity tolerance, decreased balance, decreased coordination, decreased endurance, decreased mobility, difficulty walking, decreased ROM, decreased strength, impaired flexibility, impaired UE functional use, obesity, and pain.   ACTIVITY LIMITATIONS: standing, squatting, stairs, transfers, and locomotion level  PARTICIPATION LIMITATIONS: driving, shopping, community activity, and church  PERSONAL FACTORS: Age, Behavior pattern, Education, Fitness, Past/current experiences, Social background, and Time since onset of injury/illness/exacerbation are also affecting patient's functional outcome.   REHAB POTENTIAL: Fair chronicity of impairments  CLINICAL DECISION MAKING: Stable/uncomplicated  EVALUATION COMPLEXITY: Low   GOALS: Goals reviewed with patient? Yes  SHORT TERM GOALS: Target date: 02/18/2023   Will be compliant with appropriate progressive HEP  Baseline: Goal status: MET 02/19/23  2.  Back pain to have improved by 50%  Baseline:  Goal status: Not MET 02/19/2023, pt stating it has improved about 25%  3.  Will be able to ambulate household distances with improved stance/step times/lengths and no unsteadiness Baseline:  Goal status:  MET 02/19/23, using straight cane    LONG TERM GOALS: Target date: 03/28/2023      MMT to improve by 1 grade all weak groups  Baseline:  Goal status: Met 03/14/2023  2.  Will complete TUG in 15 seconds or less no device  Baseline:  Goal status: Met 03/14/2023  3.  Back pain to have improved by 75%  Baseline:  Goal status: on going 03/14/2023  4.  Will be able to ambulate at least household distances no device no unsteadiness  Baseline:  Goal status: on going 03/14/2023  5.  Knee flexion AROM to improve by 10 degrees B  Baseline:  Goal status: on going  03/14/2023       PLAN:  PT FREQUENCY:1x/week  PT DURATION: up to 2x/weeks  PLANNED INTERVENTIONS: Therapeutic exercises, Therapeutic activity, Neuromuscular re-education, Balance training, Gait training, Patient/Family education, Self Care, Joint mobilization, Stair training, DME instructions, Aquatic Therapy, Dry Needling, Electrical stimulation, Spinal mobilization, Cryotherapy, Moist heat, Taping, Ultrasound, Ionotophoresis 4mg /ml Dexamethasone, Manual therapy, and Re-evaluation  PLAN FOR NEXT SESSION:  HEP review for trial HEP.  Measurement updates.     02/26/2023-04/09/2023 - 12 visits approved by [facility]   Chyrel Masson, PT, DPT, OCS, ATC 03/14/23  12:26 PM

## 2023-03-18 ENCOUNTER — Telehealth: Payer: Self-pay | Admitting: Cardiology

## 2023-03-18 NOTE — Telephone Encounter (Signed)
Called pt advised of pharmacist response:  Ok for pt to try stopping her simvastatin for 1-2 weeks to see if her symptoms improve. It looks like simvastatin 40mg  daily is the only statin she has been prescribed before. If her symptoms improve off of med, recommend trying rosuvastatin 10mg  daily instead - should be better tolerated. If her statin is changed, recommend rechecking lipid panel in 6-8 weeks as well to confirm LDL remains well controlled.    Pt advised to call our office in 2 weeks to give an update.  Pt has no further questions or concerns at this time.

## 2023-03-18 NOTE — Telephone Encounter (Signed)
Ok for pt to try stopping her simvastatin for 1-2 weeks to see if her symptoms improve. It looks like simvastatin 40mg  daily is the only statin she has been prescribed before. If her symptoms improve off of med, recommend trying rosuvastatin 10mg  daily instead - should be better tolerated. If her statin is changed, recommend rechecking lipid panel in 6-8 weeks as well to confirm LDL remains well controlled.

## 2023-03-18 NOTE — Telephone Encounter (Signed)
Spoke with pt who complains of increasing muscle aches/cramping of back, hips, legs and feet as well as some episodes of confusion/memory fog x at least one month.  Pt reports she has discussed these issues with her PCP and and her ortho.  Her mini mental testing was normal per the pt.  Pt is wondering if it might be related to her Statin.   Pt advised will forward to Dr Jacinto Halim and our PharmD for review and further recommendation.  Pt verbalizes understanding and agrees with current plan.

## 2023-03-18 NOTE — Telephone Encounter (Signed)
Pt c/o medication issue:  1. Name of Medication:   simvastatin (ZOCOR) 40 MG tablet    2. How are you currently taking this medication (dosage and times per day)?  Take 40 mg by mouth at bedtime.       3. Are you having a reaction (difficulty breathing--STAT)? No  4. What is your medication issue? Reuel Boom called from [place] suggesting we reach out to the pt regarding concerns with this medication. Pt stated she's been experiencing some muscle issues and losing train of thought throughout the day often. Please advise

## 2023-03-20 NOTE — Telephone Encounter (Signed)
Agree 

## 2023-03-21 ENCOUNTER — Ambulatory Visit (INDEPENDENT_AMBULATORY_CARE_PROVIDER_SITE_OTHER): Payer: Medicare Other | Admitting: Rehabilitative and Restorative Service Providers"

## 2023-03-21 ENCOUNTER — Encounter: Payer: Self-pay | Admitting: Rehabilitative and Restorative Service Providers"

## 2023-03-21 DIAGNOSIS — M25662 Stiffness of left knee, not elsewhere classified: Secondary | ICD-10-CM

## 2023-03-21 DIAGNOSIS — R262 Difficulty in walking, not elsewhere classified: Secondary | ICD-10-CM

## 2023-03-21 DIAGNOSIS — M25661 Stiffness of right knee, not elsewhere classified: Secondary | ICD-10-CM

## 2023-03-21 DIAGNOSIS — M6281 Muscle weakness (generalized): Secondary | ICD-10-CM

## 2023-03-21 DIAGNOSIS — R2681 Unsteadiness on feet: Secondary | ICD-10-CM

## 2023-03-21 NOTE — Therapy (Addendum)
OUTPATIENT PHYSICAL THERAPY TREATMENT  / DISCHARGE   Patient Name: Julie Graves MRN: [id number] DOB:1941/06/20, 82 y.o., female Today's Date: 03/21/2023  END OF SESSION:  PT End of Session - 03/21/23 1113     Visit Number 9    Number of Visits 10    Date for PT Re-Evaluation 03/28/23    Authorization Type [employer]    Authorization Time Period 02/26/2023 - 04/09/2023 12 visits    Authorization - Visit Number 4    Authorization - Number of Visits 12    Progress Note Due on Visit 20    PT Start Time 1106    PT Stop Time 1134    PT Time Calculation (min) 28 min    Activity Tolerance Patient tolerated treatment well    Behavior During Therapy [facility] for tasks assessed/performed                   Past Medical History:  Diagnosis Date   Anginal pain (HCC)    "small pain?nerves in chest"started 10/05/13    Anginal pain (HCC)    cleared by cardiology 3/15   Anxiety    rx given recent not taken yet   Aortic insufficiency    mild on 9/11 cath   CAD (coronary artery disease)    nonobstructive let heart cath 3/11: 40% ostial D1, 30% mid CFX   CHF (congestive heart failure) (HCC)    Colon polyps 02/01/2009    MULTIPLE FRAGMENTS OF TUBULAR ADENOMAS   Diabetes mellitus    Frozen shoulder    GERD (gastroesophageal reflux disease)    occ   History of blood transfusion    pregnancy   HLD (hyperlipidemia)    HTN (hypertension)    ACEI cough   Hx of cardiovascular stress test    Lexiscan Myoview (10/2013):  Fixed ant defect most c/w breast attenuation, cannot exclude apical infarct; no ischemia, EF 46%; Low Risk   Nonischemic cardiomyopathy (HCC)    Ech 3/11 difficult study, moderate aortic insuficiency noted. Left evntriculogram 3/11   Obese    Osteoarthritis    Palpitation    PACs noted on telemetry while in the hospital   Pneumonia    hx   Stress incontinence    Past Surgical History:  Procedure Laterality Date   ABDOMINAL HYSTERECTOMY     APPENDECTOMY      BILIARY DILATION  02/09/2022   Procedure: BILIARY DILATION;  Surgeon: Lynann Bologna, MD;  Location: [facility] ENDOSCOPY;  Service: Gastroenterology;;   BLADDER NECK SUSPENSION  1985   CARDIAC CATHETERIZATION     CHOLECYSTECTOMY N/A 08/24/2020   Procedure: LAPAROSCOPIC CHOLECYSTECTOMY;  Surgeon: Harriette Bouillon, MD;  Location: [facility] ORS;  Service: General;  Laterality: N/A;   ERCP N/A 02/09/2022   Procedure: ENDOSCOPIC RETROGRADE CHOLANGIOPANCREATOGRAPHY (ERCP);  Surgeon: Lynann Bologna, MD;  Location: [facility] ENDOSCOPY;  Service: Gastroenterology;  Laterality: N/A;   EYE SURGERY     gall stones     2023   HAND SURGERY     right "had knots cut out"   KNEE ARTHROPLASTY Right 12/09/2013   Procedure: COMPUTER ASSISTED Right TOTAL KNEE ARTHROPLASTY;  Surgeon: Eldred Manges, MD;  Location: [facility] OR;  Service: Orthopedics;  Laterality: Right;   LEFT HEART CATH AND CORONARY ANGIOGRAPHY N/A 08/15/2017   Procedure: LEFT HEART CATH AND CORONARY ANGIOGRAPHY;  Surgeon: Corky Crafts, MD;  Location: [facility] INVASIVE CV LAB;  Service: Cardiovascular;  Laterality: N/A;   REMOVAL OF STONES  02/09/2022   Procedure:  REMOVAL OF STONES;  Surgeon: Lynann Bologna, MD;  Location: [facility] ENDOSCOPY;  Service: Gastroenterology;;   Dennison Mascot  02/09/2022   Procedure: Dennison Mascot;  Surgeon: Lynann Bologna, MD;  Location: [facility] ENDOSCOPY;  Service: Gastroenterology;;   TOTAL KNEE ARTHROPLASTY Left 05/13/2020   Procedure: LEFT TOTAL KNEE ARTHROPLASTY;  Surgeon: Eldred Manges, MD;  Location: [facility] OR;  Service: Orthopedics;  Laterality: Left;   TUBAL LIGATION     Patient Active Problem List   Diagnosis Date Noted   Common bile duct (CBD) obstruction 02/09/2022   Acute on chronic diastolic CHF (congestive heart failure) (HCC) 08/24/2020   Acute cholecystitis 08/23/2020   Elevated troponin 08/23/2020   Arthritis of left knee 05/13/2020   S/P total knee arthroplasty, left 05/13/2020   History of total knee arthroplasty, right 03/31/2020    Precordial pain    Chest pain 08/13/2017   Mixed dyslipidemia 08/13/2017   CAD (coronary artery disease), native coronary artery 08/13/2017   Mild episode of recurrent major depressive disorder (HCC) 11/03/2013   Urge incontinence 04/02/2013   Spinal stenosis, lumbar region, without neurogenic claudication 10/27/2012   Spinal stenosis of lumbar region 10/01/2012   Allergic rhinitis 09/26/2011   Dizziness 01/23/2011   Type II diabetes mellitus (HCC) 06/27/2010   Morbid obesity (HCC) 06/27/2010   Impaired glucose tolerance 06/27/2010   Disorder of bone and cartilage 06/22/2010   HYPERLIPIDEMIA-MIXED 03/07/2010   Essential hypertension 03/07/2010   CARDIOMEGALY 11/30/2009   OTHER DYSPNEA AND RESPIRATORY ABNORMALITIES 11/30/2009   AORTIC REGURGITATION 09/28/2009   Chronic diastolic heart failure (HCC) 09/28/2009   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   Osteoarthrosis involving multiple sites 10/26/2008    PCP: Porfirio Oar PA   REFERRING PROVIDER: Eldred Manges, MD  REFERRING DIAG:  Diagnosis  M62.81 (ICD-10-CM) - Quadriceps weakness    THERAPY DIAG:  Muscle weakness (generalized)  Difficulty in walking, not elsewhere classified  Stiffness of left knee, not elsewhere classified  Stiffness of right knee, not elsewhere classified  Unsteadiness on feet  Rationale for Evaluation and Treatment: Rehabilitation  ONSET DATE: chronic   SUBJECTIVE:   SUBJECTIVE STATEMENT: Pt indicated some back complaints upon arrival today.  Otherwise doing very similar to last few visits.   PERTINENT HISTORY: Anginal pain, anxiety, aortic insufficiency, CAD, CHF, DM, hx frozen shoulder, HLD, HTN, cardiomyopathy, obesity, OA, cardiac cath, B TKAs, hand surgery  PAIN:  NPRS scale: no pain upon arrival Pain location:  Pain description: achy, spasms Aggravating factors: walking/standing, sitting at times Relieving factors: nothing specific    PRECAUTIONS: None  WEIGHT BEARING  RESTRICTIONS: No  FALLS:  Has patient fallen in last 6 months? No  LIVING ENVIRONMENT: Lives with: lives with their spouse Lives in: House/apartment Stairs: has upstairs and downstairs in the house but can stay on first floor  Has following equipment at home: Single point cane, Walker - 2 wheeled, and bed side commode  OCCUPATION: retired   PLOF: Independent, Independent with basic ADLs, and Independent with household mobility with device  PATIENT GOALS: get away from cane, get stronger, address back pain   NEXT MD VISIT: Dr. Ophelia Charter 04/16/23  OBJECTIVE: (objective measures completed at initial evaluation unless otherwise dated)   DIAGNOSTIC FINDINGS: AP pelvis frog-leg left hip demonstrates some joint space narrowing with  small marginal osteophytes left hip more than right hip.   Impression: Mild left hip osteoarthritis.  AP and lateral lumbar spine images are obtained and reviewed.  Significant  to space narrowing L3-4 with facet arthropathy.  1 mm  anterolisthesis  L4-5.  1 cm kidney stones noted right side.   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 more than L4-5.  PATIENT SURVEYS:  03/07/2023:  FOTO update 53  02/01/2023 intake on 2nd visit: 41  predicted:45  01/28/2023 FOTO next visit pt late  COGNITION: 01/28/2023 Overall cognitive status: Within functional limits for tasks assessed     SENSATION: 01/28/2023 Not tested  PALPATION: 01/28/2023 Rt lumbar paraspinals tight and tender  LOWER EXTREMITY ROM:  Active ROM  Seated at edge of mat table  Right 01/28/2023 Left 01/28/2023 Rt / Left  02/26/2023  Hip flexion     Hip extension     Hip abduction     Hip adduction     Hip internal rotation     Hip external rotation     Knee flexion 92* 88* 94 / 92  Knee extension -3* 2* -2 / 0  Ankle dorsiflexion     Ankle plantarflexion     Ankle inversion     Ankle eversion      (Blank rows = not tested)  LOWER EXTREMITY MMT:  MMT Right 01/28/2023  Left 01/28/2023 Rt / Left 02/26/2023 Right 03/14/2023 Left 03/14/2023  Hip flexion 2+ 2+ 3  /  3  5/5 5/5  Hip extension       Hip abduction       Hip adduction       Hip internal rotation       Hip external rotation       Knee flexion 4+ 3+ 5  /  4 5/5 5/5  Knee extension 4+ 3 4+  /  4- 5/5 5/5  Ankle dorsiflexion 5 3  5    /  4     Ankle plantarflexion       Ankle inversion       Ankle eversion        (Blank rows = not tested)    FUNCTIONAL TESTS:  02/14/2023:  TUG c SPC:  12.7 seconds  02/01/2023: TUG c SPC 18.5 seconds   01/28/2023 Timed up and go (TUG): next visit   GAIT: 01/28/2023 Distance walked: in clinic distances  Assistive device utilized: Single point cane Level of assistance: Modified independence Comments: short step lengths/stance times, tends to shuffle a bit, generally unsteady                     TODAY'S TREATMENT:                                                                          DATE: 03/21/2023 Therex Nustep: Lvl 5 10 mins UE/LE  Verbal review of existing HEP and adjustments.  Cues verbally and visually given.  Handout provided with updates.   Neuro Re-ed Tandem stance 30 sec x 1 bilateral in corner with education for home use Heel/toe alternating lift in corner with education for home use x 10  TODAY'S TREATMENT:  DATE: 03/14/2023 Therex Nustep: Lvl 5 10 mins UE/LE  Standing hip extension x 15 bilateral with hands on bar Standing hip abduction x 15 bilateral with hands on bar Sit to stand to sit 18 inch chair 2 x 10  Calf stretch on slant board x 3 holding 30 sec   Neuro Re-ed Feet close together on foam 1 min with SBA Tandem stance on foam 1 min x 1 bilaterally with SBA with occasional HHA on bar Alternating heel/toe lifts 2 x 10 c SBA  TODAY'S TREATMENT:                                                                          DATE: 03/07/2023 Therex Nustep: Lvl 5 10  mins UE/LE  Leg press double leg 62 lbs x 15, single leg 31 lbs x 15 bilateral  Lateral stepping in // bars 10 ft x 3 each way Standing hip extension x 15 bilateral with hands on bar Standing hip abduction x 15 bilateral with hands on bar Calf stretch on slant board x 3 holding 30 sec Supine lumbar trunk rotation 15 sec x 3 bilateral  Supine bridge x 15 2-3 sec hold    PATIENT EDUCATION:  03/21/2023 Education details: rationale for interventions, HEP  Person educated: Patient Education method: Explanation, Demonstration, and Handouts Education comprehension: verbalized understanding, returned demonstration, and needs further education  HOME EXERCISE PROGRAM: Access Code: [email or web address] URL: [email or web address] Date: 03/21/2023 Prepared by: Chyrel Masson  Exercises - Seated Hip Abduction with Resistance  - 1-2 x daily - 7 x weekly - 1 sets - 10 reps - 2 seconds  hold - Seated Quad Set  - 3-5 x daily - 7 x weekly - 1 sets - 10 reps - 5 hold - Sit to Stand  - 3 x daily - 7 x weekly - 1 sets - 10 reps - Standing Hip Extension with Counter Support  - 1 x daily - 7 x weekly - 1-2 sets - 10 reps - Standing Hip Abduction with Counter Support  - 1 x daily - 7 x weekly - 1-2 sets - 10 reps - Heel Toe Raises with Counter Support  - 1 x daily - 7 x weekly - 3 sets - 10 reps - Tandem Stance in Corner  - 1 x daily - 7 x weekly - 1 sets - 3-5 reps - 30 hold - Supine Bridge  - 1-2 x daily - 7 x weekly - 1-2 sets - 10 reps - 2 hold - Supine Lower Trunk Rotation  - 2-3 x daily - 7 x weekly - 1 sets - 3-5 reps - 15 hold - Hooklying Single Knee to Chest Stretch  - 2-3 x daily - 7 x weekly - 1 sets - 5 reps - 15 hold  ASSESSMENT:  CLINICAL IMPRESSION: Pt has attended 9 visits overall during course of treatment.  Gains have been noted in strength, FOTO functional improvements as symptoms as well.  Pt requested transitioning trial to HEP in part due to improvements and due to visit cost.   Reviewed plan today with good knowledge shown.   Plan to trial HEP at this time.    OBJECTIVE IMPAIRMENTS: Abnormal gait, decreased activity tolerance, decreased  balance, decreased coordination, decreased endurance, decreased mobility, difficulty walking, decreased ROM, decreased strength, impaired flexibility, impaired UE functional use, obesity, and pain.   ACTIVITY LIMITATIONS: standing, squatting, stairs, transfers, and locomotion level  PARTICIPATION LIMITATIONS: driving, shopping, community activity, and church  PERSONAL FACTORS: Age, Behavior pattern, Education, Fitness, Past/current experiences, Social background, and Time since onset of injury/illness/exacerbation are also affecting patient's functional outcome.   REHAB POTENTIAL: Fair chronicity of impairments  CLINICAL DECISION MAKING: Stable/uncomplicated  EVALUATION COMPLEXITY: Low   GOALS: Goals reviewed with patient? Yes  SHORT TERM GOALS: Target date: 02/18/2023   Will be compliant with appropriate progressive HEP  Baseline: Goal status: MET 02/19/23  2.  Back pain to have improved by 50%  Baseline:  Goal status: Not MET 02/19/2023, pt stating it has improved about 25%  3.  Will be able to ambulate household distances with improved stance/step times/lengths and no unsteadiness Baseline:  Goal status:  MET 02/19/23, using straight cane    LONG TERM GOALS: Target date: 03/28/2023      MMT to improve by 1 grade all weak groups  Baseline:  Goal status: Met 03/14/2023  2.  Will complete TUG in 15 seconds or less no device  Baseline:  Goal status: Met 03/14/2023  3.  Back pain to have improved by 75%  Baseline:  Goal status: on going 03/14/2023  4.  Will be able to ambulate at least household distances no device no unsteadiness  Baseline:  Goal status: partially met (able to perform in house) reluctant to turn cane loose  5.  Knee flexion AROM to improve by 10 degrees Bbilaterally Baseline:  Goal status:  on going 03/14/2023       PLAN:  PT FREQUENCY:1x/week  PT DURATION: up to 2x/weeks  PLANNED INTERVENTIONS: Therapeutic exercises, Therapeutic activity, Neuromuscular re-education, Balance training, Gait training, Patient/Family education, Self Care, Joint mobilization, Stair training, DME instructions, Aquatic Therapy, Dry Needling, Electrical stimulation, Spinal mobilization, Cryotherapy, Moist heat, Taping, Ultrasound, Ionotophoresis 4mg /ml Dexamethasone, Manual therapy, and Re-evaluation  PLAN FOR NEXT SESSION:  HEP trial.     02/26/2023-04/09/2023 - 12 visits approved by [facility]   Chyrel Masson, PT, DPT, OCS, ATC 03/21/23  11:36 AM    PHYSICAL THERAPY DISCHARGE SUMMARY  Visits from Start of Care: 9  Current functional level related to goals / functional outcomes: See note   Remaining deficits: See note   Education / Equipment: HEP  Patient goals were partially met. Patient is being discharged due to not returning since the last visit.  Chyrel Masson, PT, DPT, OCS, ATC 04/24/23  10:54 AM
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
## 2023-04-01 NOTE — Telephone Encounter (Signed)
Pt c/o medication issue:  1. Name of Medication:  simvastatin (ZOCOR) 40 MG tablet   2. How are you currently taking this medication (dosage and times per day)?   3. Are you having a reaction (difficulty breathing--STAT)?   4. What is your medication issue?    Patient stated she has been off this medication for 2 weeks now and wants to know next steps.

## 2023-04-01 NOTE — Telephone Encounter (Signed)
Agree 

## 2023-04-16 ENCOUNTER — Encounter: Payer: Self-pay | Admitting: Orthopaedic Surgery

## 2023-04-16 ENCOUNTER — Ambulatory Visit: Payer: Medicare Other | Admitting: Orthopaedic Surgery

## 2023-04-16 VITALS — BP 107/70 | HR 54 | Ht 60.0 in | Wt 205.0 lb

## 2023-04-16 DIAGNOSIS — M6281 Muscle weakness (generalized): Secondary | ICD-10-CM | POA: Insufficient documentation

## 2023-04-16 DIAGNOSIS — M48061 Spinal stenosis, lumbar region without neurogenic claudication: Secondary | ICD-10-CM | POA: Diagnosis not present

## 2023-04-16 NOTE — Progress Notes (Signed)
Office Visit Note   Patient: Julie Graves           Date of Birth: [birth year]           MRN: [id number] Visit Date: 04/16/2023              Requested by: Porfirio Oar, PA [address] [place],  [place] [address] PCP: Porfirio Oar, PA   Assessment & Plan: Visit Diagnoses:  1. Weakness of both quadriceps muscles     Plan: Patient will continue quad strengthening exercises at home and 1 over several extra additional ones she should work on.  She needs to get an MRI scan with her continued problems with leg weakness worse on the left than right to make sure she has not had progressive foraminal stenosis or developed severe stenosis from previous moderate stenosis that was present 2015.  Continue ambulation with a cane.  Follow-Up Instructions: No follow-ups on file.   Orders:  No orders of the defined types were placed in this encounter.  No orders of the defined types were placed in this encounter.     Procedures: No procedures performed   Clinical Data: No additional findings.   Subjective: Chief Complaint  Patient presents with   Lower Back - Pain, Follow-up   Left Leg - Follow-up, Pain    HPI follow-up right total knee arthroplasty 2015 and left total knee arthroplasty 2021 with satisfactory x-rays and residual bilateral quad weakness.  She has been through therapy which is improved her quad strength to some degree.  I can still overcome her left quad with 1-2 fingers and the opposite right side takes 3-4 finger pressure.  She continues to have increased problems with back pain mostly over the right SI joint region.  She notes some numbness in her legs problems with prolonged standing and walking.  Previous MRI scan showed some disc bulge at L4-5.  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  Review of Systems positive for significant weight loss, hyperlipidemia, depression, heart  failure.   Objective: Vital Signs: BP 107/70   Pulse (!) 54   Ht 5' (1.524 m)   Wt 205 lb (93 kg)   BMI 40.04 kg/m   Physical Exam Constitutional:      Appearance: She is well-developed.  HENT:     Head: Normocephalic.     Right Ear: External ear normal.     Left Ear: External ear normal. There is no impacted cerumen.  Eyes:     Pupils: Pupils are equal, round, and reactive to light.  Neck:     Thyroid: No thyromegaly.     Trachea: No tracheal deviation.  Cardiovascular:     Rate and Rhythm: Normal rate.  Pulmonary:     Effort: Pulmonary effort is normal.  Abdominal:     Palpations: Abdomen is soft.  Musculoskeletal:     Cervical back: No rigidity.  Skin:    General: Skin is warm and dry.  Neurological:     Mental Status: She is alert and oriented to person, place, and time.  Psychiatric:        Behavior: Behavior normal.     Ortho Exam patient can lift her leg in sitting position straight.  Significant quad weakness on the left moderate on the right hamstrings are strong anterior tib EHL gastrocsoleus is strong pulses are normal.  No pain with logroll the hips.  Tenderness over the right  SI joint.  Mild trochanteric bursal tenderness worse on the left than right no pain with internal rotation of the right or left hip.  Short stride gait slight circumduction of her legs.  She has great difficulty getting up on a stepstool requiring her arms to pull forward and then push-up and also she uses a hop and then when she is on it she then extends at the hips.  Significant bilateral quad weakness worse on the left than right.  Specialty Comments:  No specialty comments available.  Imaging: No results found.   PMFS History: Patient Active Problem List   Diagnosis Date Noted   Weakness of both quadriceps muscles 04/16/2023   Common bile duct (CBD) obstruction 02/09/2022   Acute on chronic diastolic CHF (congestive heart failure) (HCC) 08/24/2020   Acute cholecystitis  08/23/2020   Elevated troponin 08/23/2020   Arthritis of left knee 05/13/2020   S/P total knee arthroplasty, left 05/13/2020   History of total knee arthroplasty, right 03/31/2020   Precordial pain    Chest pain 08/13/2017   Mixed dyslipidemia 08/13/2017   CAD (coronary artery disease), native coronary artery 08/13/2017   Mild episode of recurrent major depressive disorder (HCC) 11/03/2013   Urge incontinence 04/02/2013   Spinal stenosis, lumbar region, without neurogenic claudication 10/27/2012   Spinal stenosis of lumbar region 10/01/2012   Allergic rhinitis 09/26/2011   Dizziness 01/23/2011   Type II diabetes mellitus (HCC) 06/27/2010   Morbid obesity (HCC) 06/27/2010   Impaired glucose tolerance 06/27/2010   Disorder of bone and cartilage 06/22/2010   HYPERLIPIDEMIA-MIXED 03/07/2010   Essential hypertension 03/07/2010   CARDIOMEGALY 11/30/2009   OTHER DYSPNEA AND RESPIRATORY ABNORMALITIES 11/30/2009   AORTIC REGURGITATION 09/28/2009   Chronic diastolic heart failure (HCC) 09/28/2009   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   Osteoarthrosis involving multiple sites 10/26/2008   Past Medical History:  Diagnosis Date   Anginal pain (HCC)    "small pain?nerves in chest"started 10/05/13    Anginal pain (HCC)    cleared by cardiology 3/15   Anxiety    rx given recent not taken yet   Aortic insufficiency    mild on 9/11 cath   CAD (coronary artery disease)    nonobstructive let heart cath 3/11: 40% ostial D1, 30% mid CFX   CHF (congestive heart failure) (HCC)    Colon polyps 02/01/2009    MULTIPLE FRAGMENTS OF TUBULAR ADENOMAS   Diabetes mellitus    Frozen shoulder    GERD (gastroesophageal reflux disease)    occ   History of blood transfusion    pregnancy   HLD (hyperlipidemia)    HTN (hypertension)    ACEI cough   Hx of cardiovascular stress test    Lexiscan Myoview (10/2013):  Fixed ant defect most c/w breast attenuation, cannot exclude apical infarct; no ischemia, EF  46%; Low Risk   Nonischemic cardiomyopathy (HCC)    Ech 3/11 difficult study, moderate aortic insuficiency noted. Left evntriculogram 3/11   Obese    Osteoarthritis    Palpitation    PACs noted on telemetry while in the hospital   Pneumonia    hx   Stress incontinence     Family History  Problem Relation Age of Onset   Stroke Mother 59   Heart attack Father    Heart attack Sister    Heart disease Sister    Diabetes Sister    Thyroid cancer Sister    Diabetes Brother    Colon cancer Brother    Breast  cancer Neg Hx    Esophageal cancer Neg Hx    Pancreatic cancer Neg Hx    Stomach cancer Neg Hx     Past Surgical History:  Procedure Laterality Date   ABDOMINAL HYSTERECTOMY     APPENDECTOMY     BILIARY DILATION  02/09/2022   Procedure: BILIARY DILATION;  Surgeon: Lynann Bologna, MD;  Location: [facility] ENDOSCOPY;  Service: Gastroenterology;;   BLADDER NECK SUSPENSION  1985   CARDIAC CATHETERIZATION     CHOLECYSTECTOMY N/A 08/24/2020   Procedure: LAPAROSCOPIC CHOLECYSTECTOMY;  Surgeon: Harriette Bouillon, MD;  Location: [facility] ORS;  Service: General;  Laterality: N/A;   ERCP N/A 02/09/2022   Procedure: ENDOSCOPIC RETROGRADE CHOLANGIOPANCREATOGRAPHY (ERCP);  Surgeon: Lynann Bologna, MD;  Location: [facility] ENDOSCOPY;  Service: Gastroenterology;  Laterality: N/A;   EYE SURGERY     gall stones     2023   HAND SURGERY     right "had knots cut out"   KNEE ARTHROPLASTY Right 12/09/2013   Procedure: COMPUTER ASSISTED Right TOTAL KNEE ARTHROPLASTY;  Surgeon: Eldred Manges, MD;  Location: [facility] OR;  Service: Orthopedics;  Laterality: Right;   LEFT HEART CATH AND CORONARY ANGIOGRAPHY N/A 08/15/2017   Procedure: LEFT HEART CATH AND CORONARY ANGIOGRAPHY;  Surgeon: Corky Crafts, MD;  Location: [facility] INVASIVE CV LAB;  Service: Cardiovascular;  Laterality: N/A;   REMOVAL OF STONES  02/09/2022   Procedure: REMOVAL OF STONES;  Surgeon: Lynann Bologna, MD;  Location: [facility] ENDOSCOPY;  Service: Gastroenterology;;    Dennison Mascot  02/09/2022   Procedure: Dennison Mascot;  Surgeon: Lynann Bologna, MD;  Location: [facility] ENDOSCOPY;  Service: Gastroenterology;;   TOTAL KNEE ARTHROPLASTY Left 05/13/2020   Procedure: LEFT TOTAL KNEE ARTHROPLASTY;  Surgeon: Eldred Manges, MD;  Location: [facility] OR;  Service: Orthopedics;  Laterality: Left;   TUBAL LIGATION     Social History   Occupational History   Not on file  Tobacco Use   Smoking status: Former    Current packs/day: 0.00    Average packs/day: 1 pack/day for 15.0 years (15.0 ttl pk-yrs)    Types: Cigarettes    Start date: 10/09/1968    Quit date: 10/10/1983    Years since quitting: 39.5   Smokeless tobacco: Never   Tobacco comments:    Quit 1970s  Vaping Use   Vaping status: Never Used  Substance and Sexual Activity   Alcohol use: No    Alcohol/week: 0.0 standard drinks of alcohol   Drug use: No   Sexual activity: Not on file

## 2023-04-16 NOTE — Addendum Note (Signed)
Addended by: Rogers Seeds on: 04/16/2023 11:23 AM   Modules accepted: Orders

## 2023-05-20 ENCOUNTER — Ambulatory Visit
Admission: RE | Admit: 2023-05-20 | Discharge: 2023-05-20 | Disposition: A | Discharge status: Home / Self Care | Payer: Medicare Other | Source: Ambulatory Visit | Attending: Orthopaedic Surgery | Admitting: Orthopaedic Surgery

## 2023-05-20 DIAGNOSIS — M48061 Spinal stenosis, lumbar region without neurogenic claudication: Secondary | ICD-10-CM

## 2023-05-20 DIAGNOSIS — M6281 Muscle weakness (generalized): Secondary | ICD-10-CM

## 2023-06-14 ENCOUNTER — Other Ambulatory Visit: Payer: Self-pay | Admitting: Cardiology

## 2023-06-14 ENCOUNTER — Other Ambulatory Visit: Payer: Self-pay | Admitting: Gastroenterology

## 2023-06-14 DIAGNOSIS — I10 Essential (primary) hypertension: Secondary | ICD-10-CM

## 2023-06-14 DIAGNOSIS — I5032 Chronic diastolic (congestive) heart failure: Secondary | ICD-10-CM

## 2023-06-22 ENCOUNTER — Emergency Department (HOSPITAL_BASED_OUTPATIENT_CLINIC_OR_DEPARTMENT_OTHER)
Admission: EM | Admit: 2023-06-22 | Discharge: 2023-06-22 | Disposition: A | Discharge status: Home / Self Care | Payer: Medicare Other

## 2023-06-22 ENCOUNTER — Encounter (HOSPITAL_BASED_OUTPATIENT_CLINIC_OR_DEPARTMENT_OTHER): Payer: Self-pay | Admitting: Emergency Medicine

## 2023-06-22 DIAGNOSIS — Z7982 Long term (current) use of aspirin: Secondary | ICD-10-CM | POA: Insufficient documentation

## 2023-06-22 DIAGNOSIS — R21 Rash and other nonspecific skin eruption: Secondary | ICD-10-CM | POA: Diagnosis present

## 2023-06-22 MED ORDER — DIPHENHYDRAMINE HCL 25 MG PO CAPS
25.0000 mg | ORAL_CAPSULE | Freq: Once | ORAL | Status: AC
  Administered 2023-06-22: 25 mg via ORAL
  Filled 2023-06-22: qty 1

## 2023-06-22 MED ORDER — DEXAMETHASONE 4 MG PO TABS
4.0000 mg | ORAL_TABLET | Freq: Once | ORAL | Status: AC
  Administered 2023-06-22: 4 mg via ORAL
  Filled 2023-06-22: qty 1

## 2023-06-22 NOTE — ED Provider Notes (Signed)
[place] EMERGENCY DEPARTMENT AT [facility] Provider Note   CSN: [id number] Arrival date & time: 06/22/23  1040     History  No chief complaint on file.   Julie Graves is a 82 y.o. female.  82 year old female to the emergency department with rash.  Onset 2 days ago.  Notes red splotchy rash to forehead, right decubitus area, and to torso.  No angioedema, wheezing difficulty swallowing.  Notes the rash is quite itchy.  No inciting factors that she can recall.        Home Medications Prior to Admission medications   Medication Sig Start Date End Date Taking? Authorizing Provider  acetaminophen (TYLENOL) 325 MG tablet Take 2 tablets (650 mg total) by mouth every 6 (six) hours as needed. 08/26/20   Adam Phenix, PA-C  alendronate (FOSAMAX) 70 MG tablet Take 70 mg by mouth every Monday. Take with a full glass of water on an empty stomach. Take on mondays    [provider]  aspirin EC 81 MG tablet Take 81 mg by mouth daily. Swallow whole.    [provider]  Calcium Carb-Cholecalciferol (CALCIUM 600/VITAMIN D3 PO) Take 1 tablet by mouth daily.    [provider]  carvedilol (COREG) 6.25 MG tablet Take 6.25 mg by mouth 2 (two) times daily.    [provider]  diazepam (VALIUM) 2 MG tablet Take 1 mg by mouth daily as needed for anxiety. 11/25/19   [provider]  HYDROcodone-acetaminophen (NORCO/VICODIN) 5-325 MG tablet Take 1 tablet by mouth every 6 (six) hours as needed for moderate pain. Patient not taking: Reported on 01/15/2023    [provider]  losartan (COZAAR) 100 MG tablet TAKE 1 TABLET(100 MG) BY MOUTH DAILY 12/17/22   Yates Decamp, MD  metFORMIN (GLUCOPHAGE-XR) 500 MG 24 hr tablet Take 500 mg by mouth daily. 07/08/19   [provider]  pantoprazole (PROTONIX) 40 MG tablet Take 1 tablet (40 mg total) by mouth 2 (two) times daily before a meal. 10/31/21   Meryl Dare, MD  simvastatin (ZOCOR) 40  MG tablet Take 40 mg by mouth at bedtime.    [provider]  spironolactone (ALDACTONE) 25 MG tablet TAKE 1 TABLET(25 MG) BY MOUTH DAILY 06/14/23   Yates Decamp, MD      Allergies    Iodine, Other, Shellfish allergy, Topiramate, Losartan, Psyllium, Lisinopril, Penicillins, and Tramadol    Review of Systems   Review of Systems  Physical Exam Updated Vital Signs BP (!) 141/82 (BP Location: Right Arm)   Pulse 91   Temp 98.1 F (36.7 C)   Resp 17   SpO2 98%  Physical Exam Vitals and nursing note reviewed.  Constitutional:      General: She is not in acute distress.    Appearance: She is not toxic-appearing.  HENT:     Head: Normocephalic and atraumatic.     Nose: Nose normal.     Mouth/Throat:     Mouth: Mucous membranes are moist.     Pharynx: No oropharyngeal exudate or posterior oropharyngeal erythema.  Eyes:     Conjunctiva/sclera: Conjunctivae normal.  Cardiovascular:     Rate and Rhythm: Normal rate and regular rhythm.  Pulmonary:     Effort: Pulmonary effort is normal.  Abdominal:     General: Abdomen is flat. There is no distension.     Tenderness: There is no guarding or rebound.  Musculoskeletal:     Comments: Urticarial type rash on forehead,  also noted on upper arm, some smaller patches on torso. Does not involve mucus membranes.   Neurological:     Mental Status: She is alert and oriented to person, place, and time.  Psychiatric:        Mood and Affect: Mood normal.        Behavior: Behavior normal.     ED Results / Procedures / Treatments   Labs (all labs ordered are listed, but only abnormal results are displayed) Labs Reviewed - No data to display  EKG None  Radiology No results found.  Procedures Procedures    Medications Ordered in ED Medications  dexamethasone (DECADRON) tablet 4 mg (has no administration in time range)  diphenhydrAMINE (BENADRYL) capsule 25 mg (has no administration in time range)    ED Course/ Medical  Decision Making/ A&P                                 Medical Decision Making Well-appearing 82 year old female presenting emergency department with rash.  Afebrile vitals reassuring.  Diffuse urticarial type rash noted on patient.  It is quite itchy.  Does not involve mucous membranes to suggest Stevens-Johnson/TN or pemphigoid vulgaris.  No fever, tachycardia and rash not consistent with meningitis.  Does not appear to have bacterial component.  Labs unlikely to change management disposition at this time.  Treat supportively Decadron and Benadryl for itching.  Follow-up PCP.  Return precautions given.  Risk Prescription drug management.          Final Clinical Impression(s) / ED Diagnoses Final diagnoses:  None    Rx / DC Orders ED Discharge Orders     None         Coral Spikes, DO 06/22/23 1142

## 2023-06-22 NOTE — ED Triage Notes (Signed)
Rash behind her ears started about 2 days ago and now is on thighs.buttocks and abdomen. Itchy but not painful. No fevers. Pt hasn't tried any otc medications

## 2023-06-22 NOTE — Discharge Instructions (Signed)
He may take over-the-counter Benadryl as directed on the packaging for itching.  You may also try over-the-counter hydrocortisone cream to the most affected areas.  Please follow-up with your primary doctor.

## 2023-07-04 ENCOUNTER — Ambulatory Visit: Payer: Self-pay | Admitting: Cardiology

## 2023-07-26 ENCOUNTER — Other Ambulatory Visit: Payer: Self-pay | Admitting: Radiology

## 2023-07-26 DIAGNOSIS — M48061 Spinal stenosis, lumbar region without neurogenic claudication: Secondary | ICD-10-CM

## 2023-08-06 ENCOUNTER — Ambulatory Visit: Payer: Medicare Other | Attending: Cardiology | Admitting: Cardiology

## 2023-08-06 ENCOUNTER — Encounter: Payer: Self-pay | Admitting: Cardiology

## 2023-08-06 VITALS — BP 128/70 | HR 53 | Resp 16 | Ht 60.0 in | Wt 203.8 lb

## 2023-08-06 DIAGNOSIS — I10 Essential (primary) hypertension: Secondary | ICD-10-CM | POA: Diagnosis not present

## 2023-08-06 DIAGNOSIS — I5032 Chronic diastolic (congestive) heart failure: Secondary | ICD-10-CM

## 2023-08-06 DIAGNOSIS — I350 Nonrheumatic aortic (valve) stenosis: Secondary | ICD-10-CM | POA: Diagnosis not present

## 2023-08-06 DIAGNOSIS — E7800 Pure hypercholesterolemia, unspecified: Secondary | ICD-10-CM | POA: Diagnosis not present

## 2023-08-06 NOTE — Patient Instructions (Signed)
Medication Instructions:  Your physician recommends that you continue on your current medications as directed. Please refer to the Current Medication list given to you today.  *If you need a refill on your cardiac medications before your next appointment, please call your pharmacy*   Lab Work: none If you have labs (blood work) drawn today and your tests are completely normal, you will receive your results only by: MyChart Message (if you have MyChart) OR A paper copy in the mail If you have any lab test that is abnormal or we need to change your treatment, we will call you to review the results.   Testing/Procedures: none   Follow-Up: At [facility], you and your health needs are our priority.  As part of our continuing mission to provide you with exceptional heart care, we have created designated Provider Care Teams.  These Care Teams include your primary Cardiologist (physician) and Advanced Practice Providers (APPs -  Physician Assistants and Nurse Practitioners) who all work together to provide you with the care you need, when you need it.  We recommend signing up for the patient portal called "MyChart".  Sign up information is provided on this After Visit Summary.  MyChart is used to connect with patients for Virtual Visits (Telemedicine).  Patients are able to view lab/test results, encounter notes, upcoming appointments, etc.  Non-urgent messages can be sent to your provider as well.   To learn more about what you can do with MyChart, go to [email or web address].    Your next appointment:   As needed  Provider:   Dr Jacinto Halim    Other Instructions

## 2023-08-06 NOTE — Progress Notes (Signed)
 Cardiology Office Note:  .   Date:  08/06/2023  ID:  Julie Graves, DOB [birth year], MRN [id number] PCP: Porfirio Oar, PA  [facility] Providers Cardiologist:  None   History of Present Illness: .   Julie Graves is a 83 y.o. female patient with  morbid obesity, nonischemic cardiomyopathy with EF 40% by echo in June 2019 but normalized by medical therapy, insignificant CAD by coronary angiogram on 08/21/17 and revealed mid circumflex 50% and small with 2 distal diagonal 80% stenosis, mild aortic insufficiency and aortic stenosis, diabetes, hypertension, hyperlipidemia, former tobacco use with 15-pack-year history.    Discussed the use of AI scribe software for clinical note transcription with the patient, who gave verbal consent to proceed.  History of Present Illness   The patient, with a history of heart failure, presents for a routine follow-up. She reports no specific health concerns and has been maintaining a stable weight. She has been adhering to a low-salt diet and has not noticed any significant leg swelling. She has been managing her heart failure with losartan and spironolactone. She also reports some difficulty with mobility due to back and leg issues.     Labs   External Labs:  Care Everywhere labs 07/18/2023:  A1c 5.9%.  TSH normal at 1.260.  Total cholesterol 138, triglycerides 92, HDL 69, LDL 52.  Serum glucose 92 mg, BUN 23, creatinine 0.64, potassium 4.6, LFTs normal.  Hb 12.6/HCT 38.6, platelets 211.  Normal indicis.  Review of Systems  Cardiovascular:  Positive for dyspnea on exertion (stable). Negative for chest pain and leg swelling.   Physical Exam:   VS:  BP 128/70 (BP Location: Left Wrist, Patient Position: Sitting, Cuff Size: Normal)   Pulse (!) 53   Resp 16   Ht 5' (1.524 m)   Wt 203 lb 12.8 oz (92.4 kg)   SpO2 98%   BMI 39.80 kg/m    Wt Readings from Last 3 Encounters:  08/06/23 203 lb 12.8 oz (92.4 kg)  04/16/23 205 lb (93 kg)   01/15/23 203 lb (92.1 kg)    Physical Exam Neck:     Vascular: No JVD.  Cardiovascular:     Rate and Rhythm: Normal rate and regular rhythm.     Pulses: Intact distal pulses.     Heart sounds: S1 normal and S2 normal. Murmur heard.     Early systolic murmur is present with a grade of 2/6 at the upper right sternal border.     No gallop.  Pulmonary:     Effort: Pulmonary effort is normal.     Breath sounds: Normal breath sounds.  Abdominal:     General: Bowel sounds are normal.     Palpations: Abdomen is soft.  Musculoskeletal:     Right lower leg: No edema.     Left lower leg: No edema.    Studies Reviewed: .    Echocardiogram 08/23/2020:     1. Left ventricular ejection fraction, by estimation, is 50 to 55%. The left ventricle has low normal function. The left ventricle has no regional wall motion abnormalities. There is mild left ventricular hypertrophy. Left ventricular diastolic parameters were normal. 2. The aortic valve was not well visualized. Aortic valve regurgitation is mild. Mild aortic valve stenosis. Mean gradient 12.5 mmHg, peak 21 mmHg, calculated aortic valve area 1.73 cm.  EKG:    EKG Interpretation Date/Time:  Tuesday August 06 2023 11:13:03 EST Ventricular Rate:  55 PR Interval:  158 QRS Duration:  126 QT Interval:  424 QTC Calculation: 405 R Axis:   -35  Text Interpretation: EKG 08/06/2023: Sinus bradycardia at rate of 55 bpm, left anterior fascicular block.  Poor R progression, probably normal variant, cannot exclude anterior infarct old.  IVCD, LVH.  No significant change from 02/08/2022 Confirmed by Delrae Rend [phone]) on 08/06/2023 11:17:37 AM    EKG 07/02/2022: Normal sinus rhythm at rate of 57 bpm, left axis deviation, left anterior fascicular block. Poor R progression, cannot exclude anteroseptal infarct old. IVCD, LVH.   Medications and allergies    Allergies  Allergen Reactions   Iodine Hives, Swelling and Other (See Comments)    Other Swelling and Other (See Comments)    Shellfish- swelling throat and lips   Shellfish Allergy Swelling and Other (See Comments)    Swelling - throat and lips   Topiramate     Other reaction(s): Chest Pain   Losartan Hives and Other (See Comments)   Psyllium Other (See Comments)   Lisinopril Other (See Comments)    cough   Penicillins Hives, Rash and Other (See Comments)    Has patient had a PCN reaction causing immediate rash, facial/tongue/throat swelling, SOB or lightheadedness with hypotension: Unknown Has patient had a PCN reaction causing severe rash involving mucus membranes or skin necrosis: No Has patient had a PCN reaction that required hospitalization: No Has patient had a PCN reaction occurring within the last 10 years: No If all of the above answers are "NO", then may proceed with Cephalosporin use.    Tramadol Other (See Comments)    confusion     Current Outpatient Medications:    acetaminophen (TYLENOL) 325 MG tablet, Take 2 tablets (650 mg total) by mouth every 6 (six) hours as needed., Disp: , Rfl:    alendronate (FOSAMAX) 70 MG tablet, Take 70 mg by mouth every Monday. Take with a full glass of water on an empty stomach. Take on mondays, Disp: , Rfl:    aspirin EC 81 MG tablet, Take 81 mg by mouth daily. Swallow whole., Disp: , Rfl:    Calcium Carb-Cholecalciferol (CALCIUM 600/VITAMIN D3 PO), Take 1 tablet by mouth daily., Disp: , Rfl:    carvedilol (COREG) 6.25 MG tablet, Take 6.25 mg by mouth 2 (two) times daily., Disp: , Rfl:    diazepam (VALIUM) 2 MG tablet, Take 1 mg by mouth daily as needed for anxiety., Disp: , Rfl:    HYDROcodone-acetaminophen (NORCO/VICODIN) 5-325 MG tablet, Take 1 tablet by mouth every 6 (six) hours as needed for moderate pain (pain score 4-6)., Disp: , Rfl:    losartan (COZAAR) 100 MG tablet, TAKE 1 TABLET(100 MG) BY MOUTH DAILY, Disp: 90 tablet, Rfl: 3   metFORMIN (GLUCOPHAGE-XR) 500 MG 24 hr tablet, Take 500 mg by mouth daily., Disp:  , Rfl:    simvastatin (ZOCOR) 40 MG tablet, Take 40 mg by mouth at bedtime., Disp: , Rfl:    spironolactone (ALDACTONE) 25 MG tablet, TAKE 1 TABLET(25 MG) BY MOUTH DAILY, Disp: 90 tablet, Rfl: 0   pantoprazole (PROTONIX) 20 MG tablet, Take 20 mg by mouth daily., Disp: , Rfl:    ASSESSMENT AND PLAN: .      ICD-10-CM   1. Chronic diastolic heart failure (HCC)  X91.47 EKG 12-Lead    2. Essential hypertension  I10     3. Mild aortic stenosis  I35.0     4. Hypercholesteremia  E78.00      Assessment and Plan    Heart Failure Heart  failure is well-controlled with no signs of fluid overload or edema. EKG is stable, and she is asymptomatic. Medications and dietary restrictions are followed, with losartan and spironolactone proving effective. Emphasize maintaining a low salt and low saturated fat diet and regular physical activity, such as using a stationary bicycle. Continue losartan and spironolactone.  Hypertension Blood pressure is well-controlled at 128/70 mmHg. Losartan and spironolactone are effective. Continue both medications.  Aortic Valve Stenosis Mild aortic valve stenosis is stable and not expected to progress significantly in the near term (5-10 years). No immediate intervention is required. Monitor for symptoms and progression.  Hyperlipidemia Cholesterol levels are well-controlled with simvastatin, with LDL at 52 mg/dL. Continue simvastatin.  General Health Maintenance Encourage maintaining physical activity and weight management. Discuss the benefits of regular exercise and a balanced diet in preventing cardiovascular events.  Follow-up Follow up with primary care physician, Dr. Theora Gianotti, as needed. Contact cardiologist if new symptoms or concerns arise.   Signed,  Yates Decamp, MD, [facility] [place] [facility] 08/06/2023, 12:29 PM [facility] [address] [place], [place] [address] Phone: [phone]. Fax:  [phone]

## 2023-08-14 ENCOUNTER — Ambulatory Visit (INDEPENDENT_AMBULATORY_CARE_PROVIDER_SITE_OTHER): Payer: Medicare Other | Admitting: Orthopedic Surgery

## 2023-08-14 ENCOUNTER — Other Ambulatory Visit (INDEPENDENT_AMBULATORY_CARE_PROVIDER_SITE_OTHER): Payer: Medicare Other

## 2023-08-14 VITALS — BP 141/61 | HR 66 | Ht 60.0 in | Wt 201.0 lb

## 2023-08-14 DIAGNOSIS — M48061 Spinal stenosis, lumbar region without neurogenic claudication: Secondary | ICD-10-CM

## 2023-08-14 DIAGNOSIS — M7061 Trochanteric bursitis, right hip: Secondary | ICD-10-CM | POA: Diagnosis not present

## 2023-08-14 NOTE — Progress Notes (Signed)
 Orthopedic Spine Surgery Office Note  Assessment: Patient is a 83 y.o. female with right posterior lateral hip pain.  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  She has weakness with hip abduction and pain over the bursa, so I suspect hip as etiology   Plan: -Explained that initially conservative treatment is tried as a significant number of patients may experience relief with these treatment modalities. Discussed that the conservative treatments include:  -activity modification  -physical therapy  -over the counter pain medications  -medrol dosepak  -steroid injections -Patient has tried Tylenol, Aleve -Recommended PT.  Referral provided to her today -If she is not doing any better at her next visit, we will order an MRI of the right hip -Patient should return to office in 6 weeks, x-rays at next visit: None   Patient expressed understanding of the plan and all questions were answered to the patient's satisfaction.   ___________________________________________________________________________   History:  Patient is a 83 y.o. female who presents today for lumbar spine.  Patient has had right posterior and lateral hip pain for over 6 months now.  She says she feels it mostly when she is active or changing positions.  She does not notice it as much when she is at rest.  She says she sometimes will have the leg give way as result of the pain.  She has not had any falls.  She has no pain radiating past the hip on the right side.  No pain radiating into the left lower extremity.  She has weakness with hip flexion on the left which she said has been present since her total knee arthroplasty on that side in 2021.  She has not noticed any changes in her strength recently on that side.   Weakness: Yes, feels her left hip flexor and quad has been weaker since her total knee arthroplasty in 2021.  No other weakness noted Symptoms of  imbalance: Denies Paresthesias and numbness: Denies Bowel or bladder incontinence: Yes, has a history of stress incontinence.  No recent changes.  No change in bowel habits or incontinence. Saddle anesthesia: Denies  Treatments tried: Tylenol, Aleve  Review of systems: Denies fevers and chills, night sweats, unexplained weight loss, history of cancer, pain that wakes them at night  Past medical history: Anxiety CHF DM GERD HTN HDL Stress incontinence  Allergies: Iodine, topiramate, losartan, lisinopril, penicillin, tramadol  Past surgical history:  Bilateral TKA Appendectomy Hysterectomy Cholecystectomy Bladder neck suspension Biliary dilation Sphincterotomy Tubal ligation  Social history: Denies use of nicotine product (smoking, vaping, patches, smokeless) Alcohol use: Denies Denies recreational drug use   Physical Exam:  BMI of 39.3  General: no acute distress, appears stated age Neurologic: alert, answering questions appropriately, following commands Respiratory: unlabored breathing on room air, symmetric chest rise Psychiatric: appropriate affect, normal cadence to speech   MSK (spine):  -Strength exam      Left  Right EHL    5/5  5/5 TA    5/5  5/5 GSC    5/5  5/5 Knee extension  5/5  5/5 Hip flexion   5/5  5/5  -Sensory exam    Sensation intact to light touch in L3-S1 nerve distributions of bilateral lower extremities  -Achilles DTR: 1/4 on the left, 1/4 on the right -Patellar tendon DTR: 1/4 on the left, 1/4 on the right  -Straight leg raise: Negative bilaterally -Femoral nerve stretch test: Negative bilaterally -Clonus:  no beats bilaterally  -Left hip exam: No pain through range of motion, negative Stinchfield, negative FABER -Right hip exam: No pain through range of motion, negative Stinchfield, negative FABER, TTP over the hip abductors and greater trochanter, no other tenderness to palpation, weakness with hip abduction.  Could not  abduct her hip against gravity fully in the lateral position.  Antalgic gait  Imaging: XRs of the lumbar spine from 08/14/2023 were independently reviewed and interpreted, showing disc height loss at L2/3 and L3/4.  Grade 1 spondylolisthesis at L4/5.  No evidence of instability on flexion/extension views.  No fracture or dislocation seen.  MRI of the lumbar spine from 05/20/2023 was independently reviewed and interpreted, showing central and lateral recess stenosis at L2/3.  Bilateral foraminal stenosis at L3/4.  Grade 1 spondylolisthesis at L4/5.   Patient name: Julie Graves Patient MRN: [id number] Date of visit: 08/14/23

## 2023-08-30 ENCOUNTER — Encounter: Payer: Self-pay | Admitting: Physical Therapy

## 2023-08-30 ENCOUNTER — Other Ambulatory Visit: Payer: Self-pay

## 2023-08-30 ENCOUNTER — Ambulatory Visit: Payer: Medicare Other | Admitting: Physical Therapy

## 2023-08-30 DIAGNOSIS — M6281 Muscle weakness (generalized): Secondary | ICD-10-CM | POA: Diagnosis not present

## 2023-08-30 DIAGNOSIS — R29898 Other symptoms and signs involving the musculoskeletal system: Secondary | ICD-10-CM | POA: Diagnosis not present

## 2023-08-30 DIAGNOSIS — M25551 Pain in right hip: Secondary | ICD-10-CM

## 2023-08-30 DIAGNOSIS — R262 Difficulty in walking, not elsewhere classified: Secondary | ICD-10-CM

## 2023-08-30 NOTE — Therapy (Signed)
 OUTPATIENT PHYSICAL THERAPY LOWER EXTREMITY EVALUATION   Patient Name: Julie Graves MRN: [id number] DOB:[birth year], 83 y.o., female Today's Date: 08/30/2023  END OF SESSION:  PT End of Session - 08/30/23 1156     Visit Number 1    Number of Visits 7    Date for PT Re-Evaluation 09/20/23    Authorization Type [employer] MCR    Authorization Time Period 08/30/23 to 09/20/23    Progress Note Due on Visit 10    PT Start Time 1110   patient late   PT Stop Time 1144    PT Time Calculation (min) 34 min    Activity Tolerance Patient tolerated treatment well    Behavior During Therapy [facility] for tasks assessed/performed             Past Medical History:  Diagnosis Date   Anginal pain (HCC)    "small pain?nerves in chest"started 10/05/13    Anginal pain (HCC)    cleared by cardiology 3/15   Anxiety    rx given recent not taken yet   Aortic insufficiency    mild on 9/11 cath   CAD (coronary artery disease)    nonobstructive let heart cath 3/11: 40% ostial D1, 30% mid CFX   CHF (congestive heart failure) (HCC)    Colon polyps 02/01/2009    MULTIPLE FRAGMENTS OF TUBULAR ADENOMAS   Diabetes mellitus    Frozen shoulder    GERD (gastroesophageal reflux disease)    occ   History of blood transfusion    pregnancy   HLD (hyperlipidemia)    HTN (hypertension)    ACEI cough   Hx of cardiovascular stress test    Lexiscan Myoview (10/2013):  Fixed ant defect most c/w breast attenuation, cannot exclude apical infarct; no ischemia, EF 46%; Low Risk   Nonischemic cardiomyopathy (HCC)    Ech 3/11 difficult study, moderate aortic insuficiency noted. Left evntriculogram 3/11   Obese    Osteoarthritis    Palpitation    PACs noted on telemetry while in the hospital   Pneumonia    hx   Stress incontinence    Past Surgical History:  Procedure Laterality Date   ABDOMINAL HYSTERECTOMY     APPENDECTOMY     BILIARY DILATION  02/09/2022   Procedure: BILIARY DILATION;  Surgeon: Lynann Bologna,  MD;  Location: [facility] ENDOSCOPY;  Service: Gastroenterology;;   BLADDER NECK SUSPENSION  1985   CARDIAC CATHETERIZATION     CHOLECYSTECTOMY N/A 08/24/2020   Procedure: LAPAROSCOPIC CHOLECYSTECTOMY;  Surgeon: Harriette Bouillon, MD;  Location: [facility] ORS;  Service: General;  Laterality: N/A;   ERCP N/A 02/09/2022   Procedure: ENDOSCOPIC RETROGRADE CHOLANGIOPANCREATOGRAPHY (ERCP);  Surgeon: Lynann Bologna, MD;  Location: [facility] ENDOSCOPY;  Service: Gastroenterology;  Laterality: N/A;   EYE SURGERY     gall stones     2023   HAND SURGERY     right "had knots cut out"   KNEE ARTHROPLASTY Right 12/09/2013   Procedure: COMPUTER ASSISTED Right TOTAL KNEE ARTHROPLASTY;  Surgeon: Eldred Manges, MD;  Location: [facility] OR;  Service: Orthopedics;  Laterality: Right;   LEFT HEART CATH AND CORONARY ANGIOGRAPHY N/A 08/15/2017   Procedure: LEFT HEART CATH AND CORONARY ANGIOGRAPHY;  Surgeon: Corky Crafts, MD;  Location: [facility] INVASIVE CV LAB;  Service: Cardiovascular;  Laterality: N/A;   REMOVAL OF STONES  02/09/2022   Procedure: REMOVAL OF STONES;  Surgeon: Lynann Bologna, MD;  Location: [facility] ENDOSCOPY;  Service: Gastroenterology;;   Dennison Mascot  02/09/2022   Procedure:  SPHINCTEROTOMY;  Surgeon: Lynann Bologna, MD;  Location: [facility] ENDOSCOPY;  Service: Gastroenterology;;   TOTAL KNEE ARTHROPLASTY Left 05/13/2020   Procedure: LEFT TOTAL KNEE ARTHROPLASTY;  Surgeon: Eldred Manges, MD;  Location: [facility] OR;  Service: Orthopedics;  Laterality: Left;   TUBAL LIGATION     Patient Active Problem List   Diagnosis Date Noted   Weakness of both quadriceps muscles 04/16/2023   Common bile duct (CBD) obstruction 02/09/2022   Acute on chronic diastolic CHF (congestive heart failure) (HCC) 08/24/2020   Acute cholecystitis 08/23/2020   Elevated troponin 08/23/2020   Arthritis of left knee 05/13/2020   S/P total knee arthroplasty, left 05/13/2020   History of total knee arthroplasty, right 03/31/2020   Precordial pain    Chest pain  08/13/2017   Mixed dyslipidemia 08/13/2017   CAD (coronary artery disease), native coronary artery 08/13/2017   Mild episode of recurrent major depressive disorder (HCC) 11/03/2013   Urge incontinence 04/02/2013   Spinal stenosis, lumbar region, without neurogenic claudication 10/27/2012   Spinal stenosis of lumbar region 10/01/2012   Allergic rhinitis 09/26/2011   Dizziness 01/23/2011   Type II diabetes mellitus (HCC) 06/27/2010   Morbid obesity (HCC) 06/27/2010   Impaired glucose tolerance 06/27/2010   Disorder of bone and cartilage 06/22/2010   HYPERLIPIDEMIA-MIXED 03/07/2010   Essential hypertension 03/07/2010   CARDIOMEGALY 11/30/2009   OTHER DYSPNEA AND RESPIRATORY ABNORMALITIES 11/30/2009   AORTIC REGURGITATION 09/28/2009   Chronic diastolic heart failure (HCC) 09/28/2009   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   Osteoarthrosis involving multiple sites 10/26/2008    PCP: Porfirio Oar PA   REFERRING PROVIDER: London Sheer, MD  REFERRING DIAG: Diagnosis M70.61 (ICD-10-CM) - Trochanteric bursitis, right hip  THERAPY DIAG:  Muscle weakness (generalized)  Difficulty in walking, not elsewhere classified  Pain in right hip  Other symptoms and signs involving the musculoskeletal system  Rationale for Evaluation and Treatment: Rehabilitation  ONSET DATE: "off and on,its not the first time"   SUBJECTIVE:   SUBJECTIVE STATEMENT:   I don't know if its the hip or back that's bothering me, I get a catch in the back of my right hip. It won't go away sometimes, sometimes its gone in half a day and some times its gone in half a week. This isn't new its been happening for years and usually I just lay down pop tylenol and sleep, now I'm older   PERTINENT HISTORY: See above  PAIN:  Are you having pain? Yes: NPRS scale: 3/10 now, 10/10 at worst   Pain location: R hip  Pain description: catching  Aggravating factors: unclear "it comes on me some times" standing up,  bending to get something  Relieving factors: "it just has its course"   PRECAUTIONS: None  RED FLAGS: None   WEIGHT BEARING RESTRICTIONS: No  FALLS:  Has patient fallen in last 6 months? No  LIVING ENVIRONMENT: Lives with: lives with their spouse Lives in: House/apartment Stairs: 3 story home, stairs to second floor and basement but lives on first floor  Has following equipment at home: Single point cane and Environmental consultant - 2 wheeled  OCCUPATION: retired  PLOF: Independent, Independent with basic ADLs, Independent with gait, and Independent with transfers  PATIENT GOALS: "the doctor put me here to help me, I didn't want to come because I never got anything out of PT ever before"   NEXT MD VISIT: Dr. Christell Constant in 4 weeks (about)  OBJECTIVE:  Note: Objective measures were completed at Evaluation unless otherwise noted.  PATIENT SURVEYS:   Patient-Specific Activity Scoring Scheme  "0" represents "unable to perform." "10" represents "able to perform at prior level. 0 1 2 3 4 5 6 7 8 9  10 (Date and Score)   Activity Eval     1. Sitting to standing  10     2. Walking without cane   5    3.     4.    5.    Score 7.5    Total score = sum of the activity scores/number of activities Minimum detectable change (90%CI) for average score = 2 points Minimum detectable change (90%CI) for single activity score = 3 points     COGNITION: Overall cognitive status: Within functional limits for tasks assessed       MUSCLE LENGTH:   Piriformis severe limitation B  HS OK B       LOWER EXTREMITY MMT:  MMT Right eval Left eval  Hip flexion 4+ 2  Hip extension    Hip abduction 4+ 2  Hip adduction    Hip internal rotation    Hip external rotation    Knee flexion 4- 3-  Knee extension 4+ 3-  Ankle dorsiflexion 5 5  Ankle plantarflexion    Ankle inversion    Ankle eversion     (Blank rows = not tested)   5xSTS 16 seconds no UEs                                                                                                                                TREATMENT DATE:     08/30/23  Eval, POC, HEP   HS stretches 3x30 seconds R LE Supine lumbar rotations 6x10 seconds L LE seated marches x10  L LE LAQs x10       PATIENT EDUCATION:  Education details: exam findings, POC, HEP; extensive education that it will take time and targeted exercises to address her symptoms, will likely not resolve on its own but if she is more interested in getting the shot in her leg we could help set this up with MD.  Person educated: Patient Education method: Explanation, Demonstration, and Handouts Education comprehension: verbalized understanding, returned demonstration, and needs further education  HOME EXERCISE PROGRAM: Access Code: [email or web address] URL: [email or web address] Date: 08/30/2023 Prepared by: Nedra Hai  Exercises - Seated Hamstring Stretch  - 1 x daily - 7 x weekly - 1 sets - 3 reps - 30 seconds  hold - Supine Lower Trunk Rotation  - 1 x daily - 7 x weekly - 1 sets - 3 reps - 30 seconds hold - Seated March  - 1 x daily - 7 x weekly - 1 sets - 10 reps - 1 second  hold - Seated Long Arc Quad  - 1 x daily - 7 x weekly - 1 sets - 10 reps - 1 second  hold  ASSESSMENT:  CLINICAL IMPRESSION: Patient is a 83 y.o. F who was  seen today for physical therapy evaluation and treatment for Diagnosis M70.61 (ICD-10-CM) - Trochanteric bursitis, right hip. Unfortunately she is very self limiting when it comes to exercise and activity. L LE is extremely weak and she needed multiple cues to stop using UEs to move L LE around. She can only come once a week due to multiple other doctor's appointments and is extremely sedentary in general. We will trial PT however will plan to refer back to MD if she shows limited progress/benefit.  OBJECTIVE IMPAIRMENTS: Abnormal gait, cardiopulmonary status limiting activity, decreased ROM, decreased strength, hypomobility,  increased fascial restrictions, impaired perceived functional ability, increased muscle spasms, impaired flexibility, obesity, and pain.   ACTIVITY LIMITATIONS: sitting, standing, squatting, sleeping, stairs, transfers, and locomotion level  PARTICIPATION LIMITATIONS: driving, shopping, and community activity  PERSONAL FACTORS: Age, Behavior pattern, Education, Fitness, Past/current experiences, Social background, and Time since onset of injury/illness/exacerbation are also affecting patient's functional outcome.   REHAB POTENTIAL: Poor very low buy in to PT, self limiting behavior, sedentary life style, chronicity of pain   CLINICAL DECISION MAKING: Stable/uncomplicated  EVALUATION COMPLEXITY: Low   GOALS: Goals reviewed with patient? No  SHORT TERM GOALS: Target date: 09/20/2023   Will be compliant with appropriate progressive HEP  Baseline: Goal status: INITIAL  2.  MMT to have improved by 1 grade in all weak groups  Baseline:  Goal status: INITIAL  3.  HS and Piriformis flexibility to have improved by 50% Baseline:  Goal status: INITIAL  4.  Pain to be no more than 7/10 at worst  Baseline:  Goal status: INITIAL    LONG TERM GOALS: Target date: STGs = LTGs      PLAN:  PT FREQUENCY: 2x/week  PT DURATION: 3 weeks  PLANNED INTERVENTIONS: 97110-Therapeutic exercises, 97530- Therapeutic activity, 97112- Neuromuscular re-education, 97535- Self Care, 82956- Manual therapy, [id number]- Gait training, Taping, and Dry Needling  PLAN FOR NEXT SESSION: gently get her moving as able. DC back to MD if she fails to make reasonable progress/does not start to feel better in limited PT trial   Nedra Hai, PT, DPT 08/30/23 11:59 AM    Date of referral: 08/14/23 Referring provider: Willia Craze  Referring diagnosis? London Sheer, MD Treatment diagnosis? (if different than referring diagnosis) M62.81, R26.2, M25.551, R29.898  What was this (referring dx) caused by?  Ongoing Issue  [place]by Dawes of Condition: Recurrent (multiple episodes of < 3 months)   Laterality: Rt  Current Functional Measure Score: Other PSFS 7.5   Objective measurements identify impairments when they are compared to normal values, the uninvolved extremity, and prior level of function.  [x]  Yes  []  No  Objective assessment of functional ability: Severe functional limitations   Briefly describe symptoms: comes and goes, hard to predict pain and how long it will last   How did symptoms start: insidiously   Average pain intensity:  Last 24 hours: 5  Past week: 5  How often does the pt experience symptoms? Frequently  How much have the symptoms interfered with usual daily activities? Quite a bit  How has condition changed since care began at this facility? NA - initial visit  In general, how is the patients overall health? Fair   BACK PAIN (STarT Back Screening Tool) No

## 2023-09-17 ENCOUNTER — Encounter: Payer: Self-pay | Admitting: Rehabilitative and Restorative Service Providers"

## 2023-09-17 ENCOUNTER — Ambulatory Visit (INDEPENDENT_AMBULATORY_CARE_PROVIDER_SITE_OTHER): Admitting: Rehabilitative and Restorative Service Providers"

## 2023-09-17 DIAGNOSIS — M25551 Pain in right hip: Secondary | ICD-10-CM | POA: Diagnosis not present

## 2023-09-17 DIAGNOSIS — R262 Difficulty in walking, not elsewhere classified: Secondary | ICD-10-CM

## 2023-09-17 DIAGNOSIS — M6281 Muscle weakness (generalized): Secondary | ICD-10-CM | POA: Diagnosis not present

## 2023-09-17 DIAGNOSIS — R29898 Other symptoms and signs involving the musculoskeletal system: Secondary | ICD-10-CM | POA: Diagnosis not present

## 2023-09-17 NOTE — Therapy (Signed)
 OUTPATIENT PHYSICAL THERAPY TREATMENT   Patient Name: Julie Graves MRN: [id number] DOB:29-Jun-1940, 83 y.o., female Today's Date: 09/17/2023  END OF SESSION:  PT End of Session - 09/17/23 1101     Visit Number 2    Number of Visits 7    Date for PT Re-Evaluation 09/20/23    Authorization Type [employer] MCR $20 copay    Authorization Time Period 08/30/23 to 09/20/23    Progress Note Due on Visit 10    PT Start Time 1056    PT Stop Time 1136    PT Time Calculation (min) 40 min    Activity Tolerance Patient tolerated treatment well    Behavior During Therapy [facility] for tasks assessed/performed              Past Medical History:  Diagnosis Date   Anginal pain (HCC)    "small pain?nerves in chest"started 10/05/13    Anginal pain (HCC)    cleared by cardiology 3/15   Anxiety    rx given recent not taken yet   Aortic insufficiency    mild on 9/11 cath   CAD (coronary artery disease)    nonobstructive let heart cath 3/11: 40% ostial D1, 30% mid CFX   CHF (congestive heart failure) (HCC)    Colon polyps 02/01/2009    MULTIPLE FRAGMENTS OF TUBULAR ADENOMAS   Diabetes mellitus    Frozen shoulder    GERD (gastroesophageal reflux disease)    occ   History of blood transfusion    pregnancy   HLD (hyperlipidemia)    HTN (hypertension)    ACEI cough   Hx of cardiovascular stress test    Lexiscan Myoview (10/2013):  Fixed ant defect most c/w breast attenuation, cannot exclude apical infarct; no ischemia, EF 46%; Low Risk   Nonischemic cardiomyopathy (HCC)    Ech 3/11 difficult study, moderate aortic insuficiency noted. Left evntriculogram 3/11   Obese    Osteoarthritis    Palpitation    PACs noted on telemetry while in the hospital   Pneumonia    hx   Stress incontinence    Past Surgical History:  Procedure Laterality Date   ABDOMINAL HYSTERECTOMY     APPENDECTOMY     BILIARY DILATION  02/09/2022   Procedure: BILIARY DILATION;  Surgeon: Lynann Bologna, MD;  Location: [facility]  ENDOSCOPY;  Service: Gastroenterology;;   BLADDER NECK SUSPENSION  1985   CARDIAC CATHETERIZATION     CHOLECYSTECTOMY N/A 08/24/2020   Procedure: LAPAROSCOPIC CHOLECYSTECTOMY;  Surgeon: Harriette Bouillon, MD;  Location: [facility] ORS;  Service: General;  Laterality: N/A;   ERCP N/A 02/09/2022   Procedure: ENDOSCOPIC RETROGRADE CHOLANGIOPANCREATOGRAPHY (ERCP);  Surgeon: Lynann Bologna, MD;  Location: [facility] ENDOSCOPY;  Service: Gastroenterology;  Laterality: N/A;   EYE SURGERY     gall stones     2023   HAND SURGERY     right "had knots cut out"   KNEE ARTHROPLASTY Right 12/09/2013   Procedure: COMPUTER ASSISTED Right TOTAL KNEE ARTHROPLASTY;  Surgeon: Eldred Manges, MD;  Location: [facility] OR;  Service: Orthopedics;  Laterality: Right;   LEFT HEART CATH AND CORONARY ANGIOGRAPHY N/A 08/15/2017   Procedure: LEFT HEART CATH AND CORONARY ANGIOGRAPHY;  Surgeon: Corky Crafts, MD;  Location: [facility] INVASIVE CV LAB;  Service: Cardiovascular;  Laterality: N/A;   REMOVAL OF STONES  02/09/2022   Procedure: REMOVAL OF STONES;  Surgeon: Lynann Bologna, MD;  Location: [facility] ENDOSCOPY;  Service: Gastroenterology;;   Dennison Mascot  02/09/2022   Procedure: SPHINCTEROTOMY;  Surgeon: Lynann Bologna, MD;  Location: [facility] ENDOSCOPY;  Service: Gastroenterology;;   TOTAL KNEE ARTHROPLASTY Left 05/13/2020   Procedure: LEFT TOTAL KNEE ARTHROPLASTY;  Surgeon: Eldred Manges, MD;  Location: [facility] OR;  Service: Orthopedics;  Laterality: Left;   TUBAL LIGATION     Patient Active Problem List   Diagnosis Date Noted   Weakness of both quadriceps muscles 04/16/2023   Common bile duct (CBD) obstruction 02/09/2022   Acute on chronic diastolic CHF (congestive heart failure) (HCC) 08/24/2020   Acute cholecystitis 08/23/2020   Elevated troponin 08/23/2020   Arthritis of left knee 05/13/2020   S/P total knee arthroplasty, left 05/13/2020   History of total knee arthroplasty, right 03/31/2020   Precordial pain    Chest pain 08/13/2017   Mixed  dyslipidemia 08/13/2017   CAD (coronary artery disease), native coronary artery 08/13/2017   Mild episode of recurrent major depressive disorder (HCC) 11/03/2013   Urge incontinence 04/02/2013   Spinal stenosis, lumbar region, without neurogenic claudication 10/27/2012   Spinal stenosis of lumbar region 10/01/2012   Allergic rhinitis 09/26/2011   Dizziness 01/23/2011   Type II diabetes mellitus (HCC) 06/27/2010   Morbid obesity (HCC) 06/27/2010   Impaired glucose tolerance 06/27/2010   Disorder of bone and cartilage 06/22/2010   HYPERLIPIDEMIA-MIXED 03/07/2010   Essential hypertension 03/07/2010   CARDIOMEGALY 11/30/2009   OTHER DYSPNEA AND RESPIRATORY ABNORMALITIES 11/30/2009   AORTIC REGURGITATION 09/28/2009   Chronic diastolic heart failure (HCC) 09/28/2009   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   Osteoarthrosis involving multiple sites 10/26/2008    PCP: Porfirio Oar PA   REFERRING PROVIDER: London Sheer, MD  REFERRING DIAG: Diagnosis M70.61 (ICD-10-CM) - Trochanteric bursitis, right hip  THERAPY DIAG:  Muscle weakness (generalized)  Difficulty in walking, not elsewhere classified  Pain in right hip  Other symptoms and signs involving the musculoskeletal system  Rationale for Evaluation and Treatment: Rehabilitation  ONSET DATE: "off and on,its not the first time"   SUBJECTIVE:   SUBJECTIVE STATEMENT: Pt indicated feeling stiff in back and Lt leg.  Pt indicated no having specific pain upon arrival today.   PERTINENT HISTORY: See above   PAIN:  NPRS scale: in last few days:  5/10 Pain location: back and Lt leg Pain description: stiffness  Aggravating factors: unclear "it comes on me some times" standing up, bending to get something  Relieving factors: "it just has its course"   PRECAUTIONS: None  RED FLAGS: None   WEIGHT BEARING RESTRICTIONS: No  FALLS:  Has patient fallen in last 6 months? No  LIVING ENVIRONMENT: Lives with: lives with  their spouse Lives in: House/apartment Stairs: 3 story home, stairs to second floor and basement but lives on first floor  Has following equipment at home: Single point cane and Environmental consultant - 2 wheeled  OCCUPATION: retired  PLOF: Independent, Independent with basic ADLs, Independent with gait, and Independent with transfers  PATIENT GOALS: "the doctor put me here to help me, I didn't want to come because I never got anything out of PT ever before"   NEXT MD VISIT: Dr. Christell Constant in 4 weeks (about)  OBJECTIVE:  Note: Objective measures were completed at Evaluation unless otherwise noted.    PATIENT SURVEYS:   Patient-Specific Activity Scoring Scheme  "0" represents "unable to perform." "10" represents "able to perform at prior level. 0 1 2 3 4 5 6 7 8 9  10 (Date and Score)   Activity 08/30/23    1. Sitting to standing  10  2. Walking without cane   5    3.     4.    5.    Score 7.5    Total score = sum of the activity scores/number of activities Minimum detectable change (90%CI) for average score = 2 points Minimum detectable change (90%CI) for single activity score = 3 points  COGNITION: 08/30/23 Overall cognitive status: Within functional limits for tasks assessed     MUSCLE LENGTH: 08/30/23 Piriformis severe limitation B  HS OK B   LOWER EXTREMITY MMT:  MMT Right 08/30/23 Left 08/30/23 Right  Left   Hip flexion 4+ 2    Hip extension      Hip abduction 4+ 2    Hip adduction      Hip internal rotation      Hip external rotation      Knee flexion 4- 3-    Knee extension 4+ 3-    Ankle dorsiflexion 5 5    Ankle plantarflexion      Ankle inversion      Ankle eversion       (Blank rows = not tested)  FUNCTIONAL TESTING: 08/30/23 5xSTS 16 seconds no UEs   GAIT 09/17/2023:  Able to perform short distances without cane in clinic.  Deviation in stance bilateral at times, reduced step length.  Trendelenburg noted bilaterally at times.                                                                                                                                               TREATMENT         DATE:  09/17/2023 Therex: Nustep lvl 5 8 mins UE/LE Verbal review of HEP c cues for usage techniques. Supine lumbar trunk rotation stretch 15-20 sec hold x 3 bilateral  Seated LAQ x 10 bilateral  Supine bridge 2 x 10 2-3 sec hold Supine hooklying hip clam shell green band x 20 bilateral with contralateral leg isometric hold.   Neuro Re-ed (muscle recruitment, stabilization improvements, balance) Supine quad set 5 sec hold x 15 bilaterally for muscle recruitment Standing Tandem stance modified 1 min x 1 bilateral  c SBA    TherActivity (to improve tranfers, squatting, stairs) Leg press double leg 75 lbs x 15, single leg x 15 performed bilaterally  Sit to stand to sit transfers 18 inch without UE assist Supine to sit to supine transfer education for log rolling techniques to improve movement.   TREATMENT         DATE:  08/30/23 Eval, POC, HEP   HS stretches 3x30 seconds R LE Supine lumbar rotations 6x10 seconds L LE seated marches x10  L LE LAQs x10   PATIENT EDUCATION:  09/17/2023 HEP update Person educated: Patient Education method: Explanation, Demonstration, and Handouts Education comprehension: verbalized understanding, returned demonstration, and needs further education  HOME EXERCISE PROGRAM: Access Code: [email or web address]  URL: [email or web address] Date: 09/17/2023 Prepared by: Chyrel Masson  Exercises - Seated Hamstring Stretch  - 1 x daily - 7 x weekly - 1 sets - 3 reps - 30 seconds  hold - Supine Lower Trunk Rotation  - 1 x daily - 7 x weekly - 1 sets - 3 reps - 30 seconds hold - Seated March  - 1 x daily - 7 x weekly - 1 sets - 10 reps - 1 second  hold - Seated Long Arc Quad  - 1 x daily - 7 x weekly - 1 sets - 10 reps - 1 second  hold - Seated Quad Set  - 3-5 x daily - 7 x weekly - 1 sets - 10 reps - 5 hold - Supine  Bridge  - 1-2 x daily - 7 x weekly - 1-2 sets - 10 reps - 2 hold - Hooklying Isometric Clamshell  - 1-2 x daily - 7 x weekly - 2-3 sets - 10-15 reps - Sit to Stand  - 3 x daily - 7 x weekly - 1 sets - 5-10 reps  ASSESSMENT:  CLINICAL IMPRESSION: Return today after several weeks after eval with stiffness/weakness chief complaint.  Cues necessary in activity for HEP recall. Progressed to include additional muscle activation/strength activity to promote improve LE movement and progressive mobility.   OBJECTIVE IMPAIRMENTS: Abnormal gait, cardiopulmonary status limiting activity, decreased ROM, decreased strength, hypomobility, increased fascial restrictions, impaired perceived functional ability, increased muscle spasms, impaired flexibility, obesity, and pain.   ACTIVITY LIMITATIONS: sitting, standing, squatting, sleeping, stairs, transfers, and locomotion level  PARTICIPATION LIMITATIONS: driving, shopping, and community activity  PERSONAL FACTORS: Age, Behavior pattern, Education, Fitness, Past/current experiences, Social background, and Time since onset of injury/illness/exacerbation are also affecting patient's functional outcome.   REHAB POTENTIAL: Poor very low buy in to PT, self limiting behavior, sedentary life style, chronicity of pain   CLINICAL DECISION MAKING: Stable/uncomplicated  EVALUATION COMPLEXITY: Low   GOALS: Goals reviewed with patient? No  SHORT TERM GOALS: Target date: 09/20/2023   Will be compliant with appropriate progressive HEP  Baseline: Goal status: on going 09/17/2023  2.  MMT to have improved by 1 grade in all weak groups  Baseline:  Goal status:  on going 09/17/2023  3.  HS and Piriformis flexibility to have improved by 50% Baseline:  Goal status:  on going 09/17/2023  4.  Pain to be no more than 7/10 at worst  Baseline on going 09/17/2023  Goal status: INITIAL    LONG TERM GOALS: Target date: STGs = LTGs    PLAN:  PT FREQUENCY:  2x/week  PT DURATION: 3 weeks  PLANNED INTERVENTIONS: 97110-Therapeutic exercises, 97530- Therapeutic activity, 97112- Neuromuscular re-education, 97535- Self Care, 16109- Manual therapy, 97116- Gait training, Taping, and Dry Needling  PLAN FOR NEXT SESSION: Recert required due to date on next visit.  Goals will need adjusting.   Chyrel Masson, PT, DPT, OCS, ATC 09/17/23  11:39 AM     Date of referral: 08/14/23 Referring provider: Willia Craze  Referring diagnosis? London Sheer, MD Treatment diagnosis? (if different than referring diagnosis) M62.81, R26.2, M25.551, R29.898  What was this (referring dx) caused by? Ongoing Issue  Ashby Dawes of Condition: Recurrent (multiple episodes of < 3 months)   Laterality: Rt  Current Functional Measure Score: Other PSFS 7.5   Objective measurements identify impairments when they are compared to normal values, the uninvolved extremity, and prior level of function.  [x]  Yes  []  No  Objective assessment of  functional ability: Severe functional limitations   Briefly describe symptoms: comes and goes, hard to predict pain and how long it will last   How did symptoms start: insidiously   Average pain intensity:  Last 24 hours: 5  Past week: 5  How often does the pt experience symptoms? Frequently  How much have the symptoms interfered with usual daily activities? Quite a bit  How has condition changed since care began at this facility? NA - initial visit  In general, how is the patients overall health? Fair   BACK PAIN (STarT Back Screening Tool) No

## 2023-09-19 ENCOUNTER — Ambulatory Visit (INDEPENDENT_AMBULATORY_CARE_PROVIDER_SITE_OTHER): Admitting: Rehabilitative and Restorative Service Providers"

## 2023-09-19 ENCOUNTER — Encounter: Payer: Self-pay | Admitting: Rehabilitative and Restorative Service Providers"

## 2023-09-19 DIAGNOSIS — R29898 Other symptoms and signs involving the musculoskeletal system: Secondary | ICD-10-CM

## 2023-09-19 DIAGNOSIS — M25551 Pain in right hip: Secondary | ICD-10-CM

## 2023-09-19 DIAGNOSIS — R262 Difficulty in walking, not elsewhere classified: Secondary | ICD-10-CM

## 2023-09-19 DIAGNOSIS — M6281 Muscle weakness (generalized): Secondary | ICD-10-CM | POA: Diagnosis not present

## 2023-09-19 NOTE — Therapy (Addendum)
 OUTPATIENT PHYSICAL THERAPY TREATMENT / DISCHARGE   Patient Name: Julie Graves MRN: [id number] DOB:02/08/1941, 83 y.o., female Today's Date: 09/19/2023  END OF SESSION:  PT End of Session - 09/19/23 1058     Visit Number 3    Number of Visits 7    Date for PT Re-Evaluation 09/20/23    Authorization Type [employer] MCR $20 copay    Authorization Time Period 08/30/23 to 10/26/2023    Authorization - Visit Number 3    Progress Note Due on Visit 10    PT Start Time 1058    PT Stop Time 1138    PT Time Calculation (min) 40 min    Activity Tolerance Patient tolerated treatment well    Behavior During Therapy [facility] for tasks assessed/performed               Past Medical History:  Diagnosis Date   Anginal pain (HCC)    small pain?nerves in cheststarted 10/05/13    Anginal pain (HCC)    cleared by cardiology 3/15   Anxiety    rx given recent not taken yet   Aortic insufficiency    mild on 9/11 cath   CAD (coronary artery disease)    nonobstructive let heart cath 3/11: 40% ostial D1, 30% mid CFX   CHF (congestive heart failure) (HCC)    Colon polyps 02/01/2009    MULTIPLE FRAGMENTS OF TUBULAR ADENOMAS   Diabetes mellitus    Frozen shoulder    GERD (gastroesophageal reflux disease)    occ   History of blood transfusion    pregnancy   HLD (hyperlipidemia)    HTN (hypertension)    ACEI cough   Hx of cardiovascular stress test    Lexiscan  Myoview  (10/2013):  Fixed ant defect most c/w breast attenuation, cannot exclude apical infarct; no ischemia, EF 46%; Low Risk   Nonischemic cardiomyopathy (HCC)    Ech 3/11 difficult study, moderate aortic insuficiency noted. Left evntriculogram 3/11   Obese    Osteoarthritis    Palpitation    PACs noted on telemetry while in the hospital   Pneumonia    hx   Stress incontinence    Past Surgical History:  Procedure Laterality Date   ABDOMINAL HYSTERECTOMY     APPENDECTOMY     BILIARY DILATION  02/09/2022   Procedure: BILIARY  DILATION;  Surgeon: Charlanne Groom, MD;  Location: [facility] ENDOSCOPY;  Service: Gastroenterology;;   BLADDER NECK SUSPENSION  1985   CARDIAC CATHETERIZATION     CHOLECYSTECTOMY N/A 08/24/2020   Procedure: LAPAROSCOPIC CHOLECYSTECTOMY;  Surgeon: Vanderbilt Ned, MD;  Location: [facility] ORS;  Service: General;  Laterality: N/A;   ERCP N/A 02/09/2022   Procedure: ENDOSCOPIC RETROGRADE CHOLANGIOPANCREATOGRAPHY (ERCP);  Surgeon: Charlanne Groom, MD;  Location: [facility] ENDOSCOPY;  Service: Gastroenterology;  Laterality: N/A;   EYE SURGERY     gall stones     2023   HAND SURGERY     right had knots cut out   KNEE ARTHROPLASTY Right 12/09/2013   Procedure: COMPUTER ASSISTED Right TOTAL KNEE ARTHROPLASTY;  Surgeon: Oneil JAYSON Herald, MD;  Location: [facility] OR;  Service: Orthopedics;  Laterality: Right;   LEFT HEART CATH AND CORONARY ANGIOGRAPHY N/A 08/15/2017   Procedure: LEFT HEART CATH AND CORONARY ANGIOGRAPHY;  Surgeon: Dann Candyce RAMAN, MD;  Location: [facility] INVASIVE CV LAB;  Service: Cardiovascular;  Laterality: N/A;   REMOVAL OF STONES  02/09/2022   Procedure: REMOVAL OF STONES;  Surgeon: Charlanne Groom, MD;  Location: [facility] ENDOSCOPY;  Service:  Gastroenterology;;   ANNETT  02/09/2022   Procedure: ANNETT;  Surgeon: Charlanne Groom, MD;  Location: [facility] ENDOSCOPY;  Service: Gastroenterology;;   TOTAL KNEE ARTHROPLASTY Left 05/13/2020   Procedure: LEFT TOTAL KNEE ARTHROPLASTY;  Surgeon: Barbarann Oneil BROCKS, MD;  Location: [facility] OR;  Service: Orthopedics;  Laterality: Left;   TUBAL LIGATION     Patient Active Problem List   Diagnosis Date Noted   Weakness of both quadriceps muscles 04/16/2023   Common bile duct (CBD) obstruction 02/09/2022   Acute on chronic diastolic CHF (congestive heart failure) (HCC) 08/24/2020   Acute cholecystitis 08/23/2020   Elevated troponin 08/23/2020   Arthritis of left knee 05/13/2020   S/P total knee arthroplasty, left 05/13/2020   History of total knee arthroplasty, right 03/31/2020    Precordial pain    Chest pain 08/13/2017   Mixed dyslipidemia 08/13/2017   CAD (coronary artery disease), native coronary artery 08/13/2017   Mild episode of recurrent major depressive disorder (HCC) 11/03/2013   Urge incontinence 04/02/2013   Spinal stenosis, lumbar region, without neurogenic claudication 10/27/2012   Spinal stenosis of lumbar region 10/01/2012   Allergic rhinitis 09/26/2011   Dizziness 01/23/2011   Type II diabetes mellitus (HCC) 06/27/2010   Morbid obesity (HCC) 06/27/2010   Impaired glucose tolerance 06/27/2010   Disorder of bone and cartilage 06/22/2010   HYPERLIPIDEMIA-MIXED 03/07/2010   Essential hypertension 03/07/2010   CARDIOMEGALY 11/30/2009   OTHER DYSPNEA AND RESPIRATORY ABNORMALITIES 11/30/2009   AORTIC REGURGITATION 09/28/2009   Chronic diastolic heart failure (HCC) 09/28/2009   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   Osteoarthrosis involving multiple sites 10/26/2008    PCP: Juliane Che PA   REFERRING PROVIDER: Georgina Ozell LABOR, MD  REFERRING DIAG: Diagnosis M70.61 (ICD-10-CM) - Trochanteric bursitis, right hip  THERAPY DIAG:  Muscle weakness (generalized)  Difficulty in walking, not elsewhere classified  Pain in right hip  Other symptoms and signs involving the musculoskeletal system  Rationale for Evaluation and Treatment: Rehabilitation  ONSET DATE: off and on,its not the first time   SUBJECTIVE:   SUBJECTIVE STATEMENT: Pt indicated no specific pain with walking into clinic today.  Pt indicated no soreness increase after last visit.    PERTINENT HISTORY: See above   PAIN:  NPRS scale: in last few days:  5/10 Pain location: back and Lt leg Pain description: stiffness  Aggravating factors: unclear it comes on me some times standing up, bending to get something  Relieving factors: it just has its course   PRECAUTIONS: None  RED FLAGS: None   WEIGHT BEARING RESTRICTIONS: No  FALLS:  Has patient fallen in last 6  months? No  LIVING ENVIRONMENT: Lives with: lives with their spouse Lives in: House/apartment Stairs: 3 story home, stairs to second floor and basement but lives on first floor  Has following equipment at home: Single point cane and Environmental consultant - 2 wheeled  OCCUPATION: retired  PLOF: Independent, Independent with basic ADLs, Independent with gait, and Independent with transfers  PATIENT GOALS: the doctor put me here to help me, I didn't want to come because I never got anything out of PT ever before   NEXT MD VISIT: Dr. Georgina in 4 weeks (about)  OBJECTIVE:  Note: Objective measures were completed at Evaluation unless otherwise noted.    PATIENT SURVEYS:   Patient-Specific Activity Scoring Scheme  0 represents "unable to perform." 10 represents "able to perform at prior level. 0 1 2 3 4 5 6 7 8 9  10 (Date and Score)   Activity  08/30/23    1. Sitting to standing  10     2. Walking without cane   5    3.     4.    5.    Score 7.5    Total score = sum of the activity scores/number of activities Minimum detectable change (90%CI) for average score = 2 points Minimum detectable change (90%CI) for single activity score = 3 points  COGNITION: 08/30/23 Overall cognitive status: Within functional limits for tasks assessed     MUSCLE LENGTH: 08/30/23 Piriformis severe limitation B  HS OK B   LOWER EXTREMITY MMT:  MMT Right 08/30/23 Left 08/30/23 Right  Left   Hip flexion 4+ 2    Hip extension      Hip abduction 4+ 2    Hip adduction      Hip internal rotation      Hip external rotation      Knee flexion 4- 3-    Knee extension 4+ 3-    Ankle dorsiflexion 5 5    Ankle plantarflexion      Ankle inversion      Ankle eversion       (Blank rows = not tested)  FUNCTIONAL TESTING: 08/30/23 5xSTS 16 seconds no UEs   GAIT 09/17/2023:  Able to perform short distances without cane in clinic.  Deviation in stance bilateral at times, reduced step length.   Trendelenburg noted bilaterally at times.                                                                                                                                              TREATMENT         DATE:  09/19/2023 Therex: Nustep lvl 5 15 mins UE/LE Standing lumbar extension AROM x 5  Supine hooklying hip clam shell green band x 20 bilateral with contralateral leg isometric hold.   Neuro Re-ed (muscle recruitment, stabilization improvements, balance) Lateral stepping in // bars 10 ft x 3 each way Standing Tandem stance 1 min x 2 bilateral in // bars with occasional HHA on bar.  Standing on foam normal stance anterior/posterior weight shift x 20    TherActivity (to improve tranfers, squatting, stairs) Leg press double leg 75 lbs x 20, single leg x 20  performed bilaterally  31 lbs  Sit to stand to sit x 5 in clinic   TREATMENT         DATE:  09/17/2023 Therex: Nustep lvl 5 8 mins UE/LE Verbal review of HEP c cues for usage techniques. Supine lumbar trunk rotation stretch 15-20 sec hold x 3 bilateral  Seated LAQ x 10 bilateral  Supine bridge 2 x 10 2-3 sec hold Supine hooklying hip clam shell green band x 20 bilateral with contralateral leg isometric hold.   Neuro Re-ed (muscle recruitment, stabilization improvements, balance) Supine quad set 5 sec hold  x 15 bilaterally for muscle recruitment Standing Tandem stance modified 1 min x 1 bilateral  c SBA    TherActivity (to improve tranfers, squatting, stairs) Leg press double leg 75 lbs x 15, single leg x 15 performed bilaterally  Sit to stand to sit transfers 18 inch without UE assist Supine to sit to supine transfer education for log rolling techniques to improve movement.     PATIENT EDUCATION:  09/17/2023 HEP update Person educated: Patient Education method: Explanation, Demonstration, and Handouts Education comprehension: verbalized understanding, returned demonstration, and needs further education  HOME EXERCISE  PROGRAM: Access Code: [email or web address] URL: [email or web address] Date: 09/17/2023 Prepared by: Ozell Silvan  Exercises - Seated Hamstring Stretch  - 1 x daily - 7 x weekly - 1 sets - 3 reps - 30 seconds  hold - Supine Lower Trunk Rotation  - 1 x daily - 7 x weekly - 1 sets - 3 reps - 30 seconds hold - Seated March  - 1 x daily - 7 x weekly - 1 sets - 10 reps - 1 second  hold - Seated Long Arc Quad  - 1 x daily - 7 x weekly - 1 sets - 10 reps - 1 second  hold - Seated Quad Set  - 3-5 x daily - 7 x weekly - 1 sets - 10 reps - 5 hold - Supine Bridge  - 1-2 x daily - 7 x weekly - 1-2 sets - 10 reps - 2 hold - Hooklying Isometric Clamshell  - 1-2 x daily - 7 x weekly - 2-3 sets - 10-15 reps - Sit to Stand  - 3 x daily - 7 x weekly - 1 sets - 5-10 reps  ASSESSMENT:  CLINICAL IMPRESSION: No worsened symptoms not from activity performed last visit.  Static balance showed some improvement today.  Continued strengthening to continue to help WB activity and ambulation.    OBJECTIVE IMPAIRMENTS: Abnormal gait, cardiopulmonary status limiting activity, decreased ROM, decreased strength, hypomobility, increased fascial restrictions, impaired perceived functional ability, increased muscle spasms, impaired flexibility, obesity, and pain.   ACTIVITY LIMITATIONS: sitting, standing, squatting, sleeping, stairs, transfers, and locomotion level  PARTICIPATION LIMITATIONS: driving, shopping, and community activity  PERSONAL FACTORS: Age, Behavior pattern, Education, Fitness, Past/current experiences, Social background, and Time since onset of injury/illness/exacerbation are also affecting patient's functional outcome.   REHAB POTENTIAL: Poor very low buy in to PT, self limiting behavior, sedentary life style, chronicity of pain   CLINICAL DECISION MAKING: Stable/uncomplicated  EVALUATION COMPLEXITY: Low   GOALS: Goals reviewed with patient? No  SHORT TERM GOALS: Target date: 09/20/2023    Will be compliant with appropriate progressive HEP  Baseline: Goal status: on going 09/17/2023  2.  MMT to have improved by 1 grade in all weak groups  Baseline:  Goal status:  on going 09/17/2023  3.  HS and Piriformis flexibility to have improved by 50% Baseline:  Goal status:  on going 09/17/2023  4.  Pain to be no more than 7/10 at worst  Baseline on going 09/17/2023  Goal status: INITIAL    LONG TERM GOALS: Target date: STGs = LTGs    PLAN:  PT FREQUENCY: 2x/week  PT DURATION: 3 weeks  PLANNED INTERVENTIONS: 97110-Therapeutic exercises, 97530- Therapeutic activity, 97112- Neuromuscular re-education, 97535- Self Care, [id number]- Manual therapy, 97116- Gait training, Taping, and Dry Needling  PLAN FOR NEXT SESSION: Recert required on return (date was 09/20/2023).  [employer] medicare date is through 10/26/2023 so its fine until visits met (7 total)  Ozell Silvan, PT, DPT, OCS, ATC 09/19/23  11:38 AM    PHYSICAL THERAPY DISCHARGE SUMMARY  Visits from Start of Care: 3  Current functional level related to goals / functional outcomes: See note   Remaining deficits: See note   Education / Equipment: HEP  Patient goals were partially met. Patient is being discharged due to not returning since the last visit.    Date of referral: 08/14/23 Referring provider: Georgina Ozell  Referring diagnosis? Georgina Ozell LABOR, MD Treatment diagnosis? (if different than referring diagnosis) M62.81, R26.2, M25.551, R29.898  What was this (referring dx) caused by? Ongoing Issue  Lysle of Condition: Recurrent (multiple episodes of < 3 months)   Laterality: Rt  Current Functional Measure Score: Other PSFS 7.5   Objective measurements identify impairments when they are compared to normal values, the uninvolved extremity, and prior level of function.  [x]  Yes  []  No  Objective assessment of functional ability: Severe functional limitations   Briefly describe symptoms: comes and goes,  hard to predict pain and how long it will last   How did symptoms start: insidiously   Average pain intensity:  Last 24 hours: 5  Past week: 5  How often does the pt experience symptoms? Frequently  How much have the symptoms interfered with usual daily activities? Quite a bit  How has condition changed since care began at this facility? NA - initial visit  In general, how is the patients overall health? Fair   BACK PAIN (STarT Back Screening Tool) No

## 2023-09-23 ENCOUNTER — Other Ambulatory Visit: Payer: Self-pay | Admitting: Cardiology

## 2023-09-23 DIAGNOSIS — I5032 Chronic diastolic (congestive) heart failure: Secondary | ICD-10-CM

## 2023-09-23 DIAGNOSIS — I10 Essential (primary) hypertension: Secondary | ICD-10-CM

## 2023-09-24 ENCOUNTER — Encounter: Admitting: Physical Therapy

## 2023-09-25 ENCOUNTER — Ambulatory Visit: Payer: Medicare Other | Admitting: Orthopedic Surgery

## 2023-09-25 DIAGNOSIS — M25551 Pain in right hip: Secondary | ICD-10-CM | POA: Diagnosis not present

## 2023-09-25 NOTE — Progress Notes (Signed)
 Orthopedic Spine Surgery Office Note   Assessment: Patient is a 83 y.o. female with right posterior lateral hip pain.  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  She has weakness with hip abduction and pain over the bursa, so I suspect hip as etiology     Plan: -Patient has tried PT, Tylenol, Aleve -Patient is not currently having any pain, so I told her to keep doing the home exercise program -Will hold off on getting any further imaging as she does not have any pain -If pain returns, would consider getting an MRI of the right hip to evaluate for -Patient should return to office on an as-needed basis     Patient expressed understanding of the plan and all questions were answered to the patient's satisfaction.    ___________________________________________________________________________     History:   Patient is a 83 y.o. female who presents today for follow-up on her right hip pain.  She said since she was last seen, her pain has resolved.  She is going to a couple PT sessions.  She has been doing home exercises.  She said this is pretty typical for her hip pain in that it tends to come and go. She said the longest one her episodes of pain has lasted was a week. She said she still has some difficulty walking but is able to get around with her cane.  She has not noticed any new weakness.  She has not developed any new symptoms since she was last in the office.   Treatments tried: PT, Tylenol, Aleve     Physical Exam:   General: no acute distress, appears stated age Neurologic: alert, answering questions appropriately, following commands Respiratory: unlabored breathing on room air, symmetric chest rise Psychiatric: appropriate affect, normal cadence to speech     MSK (spine):   -Strength exam                                                   Left                  Right EHL                              5/5                   5/5 TA                                 5/5                  5/5 GSC                             5/5                  5/5 Knee extension            5/5                  5/5 Hip flexion  5/5                  5/5   -Sensory exam                           Sensation intact to light touch in L3-S1 nerve distributions of bilateral lower extremities  -Right hip exam: No pain through range of motion, negative Stinchfield, negative FABER, TTP over the hip abductors and greater trochanter, no other tenderness to palpation, weakness with hip abduction.  Cannot abduct her hip against gravity fully in the lateral position   Imaging: XRs of the lumbar spine from 08/14/2023 were previously independently reviewed and interpreted, showing disc height loss at L2/3 and L3/4.  Grade 1 spondylolisthesis at L4/5.  No evidence of instability on flexion/extension views.  No fracture or dislocation seen.   MRI of the lumbar spine from 05/20/2023 was previously independently reviewed and interpreted, showing central and lateral recess stenosis at L2/3.  Bilateral foraminal stenosis at L3/4.  Grade 1 spondylolisthesis at L4/5.     Patient name: Julie Graves Patient MRN: [id number] Date of visit: 09/25/23

## 2023-09-27 ENCOUNTER — Encounter: Admitting: Physical Therapy

## 2023-10-01 ENCOUNTER — Encounter: Admitting: Physical Therapy

## 2023-10-04 ENCOUNTER — Encounter: Admitting: Physical Therapy

## 2023-10-08 ENCOUNTER — Encounter: Admitting: Physical Therapy

## 2023-12-30 ENCOUNTER — Other Ambulatory Visit: Payer: Self-pay | Admitting: Cardiology

## 2023-12-30 DIAGNOSIS — I5042 Chronic combined systolic (congestive) and diastolic (congestive) heart failure: Secondary | ICD-10-CM

## 2023-12-30 DIAGNOSIS — I10 Essential (primary) hypertension: Secondary | ICD-10-CM

## 2024-03-16 ENCOUNTER — Telehealth: Payer: Self-pay | Admitting: Cardiology

## 2024-03-16 NOTE — Telephone Encounter (Signed)
 Pt c/o of Chest Pain: STAT if active (IN THIS MOMENT) CP, including tightness, pressure, jaw pain, shoulder/upper arm/back pain, SOB, nausea, and vomiting.  1. Are you having CP right now (tightness, pressure, or discomfort)? No  2. Are you experiencing any other symptoms (ex. SOB, nausea, vomiting, sweating)? No - pt says it feels like flutters and heaviness in chest  3. How long have you been experiencing CP? 1 week  4. Is your CP continuous or coming and going? Coming and going - mostly in the morning  5. Have you taken Nitroglycerin ? No  6. If CP returns before callback, please consider calling 911. ?

## 2024-03-16 NOTE — Telephone Encounter (Signed)
 Spoke with pt regarding chest heaviness. Pt stated she feels a heaviness in the left side of her chest when she wakes up in the morning. Eventually this heaviness subsides. Pt stated she is not sure how to describe the heaviness but that it doesn't feel normal. Pt stated this has been happening for about a week. Pt denied any chest pain, lightheadedness/dizziness or shortness of breath. Pt denied that the heaviness radiates. Pt stated she has felt more tired lately. Pt was given Ed precautions and an appointment was made with Dr. Ladona on 9/25. Pt verbalized understanding. All questions if any were answered.

## 2024-03-17 ENCOUNTER — Ambulatory Visit: Admitting: Physician Assistant

## 2024-03-19 ENCOUNTER — Other Ambulatory Visit (HOSPITAL_COMMUNITY): Payer: Self-pay

## 2024-03-19 ENCOUNTER — Ambulatory Visit: Attending: Cardiology | Admitting: Cardiology

## 2024-03-19 ENCOUNTER — Encounter (HOSPITAL_COMMUNITY): Payer: Self-pay | Admitting: *Deleted

## 2024-03-19 ENCOUNTER — Encounter: Payer: Self-pay | Admitting: Cardiology

## 2024-03-19 VITALS — BP 126/62 | HR 56 | Ht 60.0 in | Wt 199.6 lb

## 2024-03-19 DIAGNOSIS — I10 Essential (primary) hypertension: Secondary | ICD-10-CM | POA: Diagnosis not present

## 2024-03-19 DIAGNOSIS — K219 Gastro-esophageal reflux disease without esophagitis: Secondary | ICD-10-CM

## 2024-03-19 DIAGNOSIS — I350 Nonrheumatic aortic (valve) stenosis: Secondary | ICD-10-CM | POA: Diagnosis not present

## 2024-03-19 DIAGNOSIS — I25118 Atherosclerotic heart disease of native coronary artery with other forms of angina pectoris: Secondary | ICD-10-CM

## 2024-03-19 MED ORDER — PANTOPRAZOLE SODIUM 40 MG PO TBEC
40.0000 mg | DELAYED_RELEASE_TABLET | Freq: Every day | ORAL | 0 refills | Status: DC
  Filled 2024-03-19: qty 90, 90d supply, fill #0

## 2024-03-19 NOTE — Progress Notes (Signed)
 Cardiology Office Note:  .   Date:  03/19/2024  ID:  Julie Graves, DOB 01-Apr-1941, MRN [id number] PCP: Juliane Che, PA  [facility] Providers Cardiologist:  None   History of Present Illness: .   Julie Graves is a 83 y.o. female patient with morbid obesity, nonischemic cardiomyopathy with EF 40% by echo in June 2019 but normalized by medical therapy by echocardiogram on 08/23/2020 which also revealed mild aortic stenosis, CAD by cardiac catheterization on 08/21/17 and revealed mid circumflex 50% and small with 2 distal diagonal 80% stenosis, mild aortic insufficiency and aortic stenosis, diabetes, hypertension, hyperlipidemia, former tobacco use with 15-pack-year history.  She made an appointment to see me in view of new onset of chest pain.  Patient chest pain symptoms occur only after night or in the evening, sometimes associated with the type of food she eats.  She also notices chest discomfort when she is anxious or stressed and is relieved with rest.  She is accompanied by her daughter. Patient is extremely anxious and has been fatigued taking care of her husband who has got significant dementia.  Cardiac Studies relevent.    Echocardiogram 08/23/2020:     1. Left ventricular ejection fraction, by estimation, is 50 to 55%. The left ventricle has low normal function. The left ventricle has no regional wall motion abnormalities. There is mild left ventricular hypertrophy. Left ventricular diastolic parameters were normal. 2. The aortic valve was not well visualized. Aortic valve regurgitation is mild. Mild aortic valve stenosis. Mean gradient 12.5 mmHg, peak 21 mmHg, calculated aortic valve area 1.73 cm  CARDIAC CATHETERIZATION 08/15/2017      Discussed the use of AI scribe software for clinical note transcription with the patient, who gave verbal consent to proceed.  History of Present Illness She experiences chest heaviness primarily in the mornings, lasting  about half of the morning, with dyspnea and palpitations, especially under stress. These symptoms have persisted for two to three weeks. Nocturnal palpitations occur, with episodes of waking up with a racing heart and fear. Symptoms improve by the afternoon, and she holds her chest in the mornings, with discomfort easing as the day progresses.  Physical activity is limited due to pain and difficulty walking, necessitating the use of a cane. She desires increased activity but is constrained by caregiving responsibilities for her husband with dementia, which impacts her personal activities and social life.  She has coronary artery disease with known blockages from a 2019 cardiac catheterization. Current medications include carvedilol  6.25 mg twice daily, losartan  100 mg daily, simvastatin  40 mg daily, and a baby aspirin . She discontinued pantoprazole  due to concerns about memory loss.  Labs   Care everywhere/Faxed External Labs:  Labs 01/21/2024:  A1c 5.9%.  Hb 12.7/HCT 39.1, platelets 230.  Total cholesterol 147, triglycerides 149, HDL 68, LDL 54.  BUN 20, creatinine 0.70, eGFR 86 mL, potassium 5.1, LFTs normal.  Urinary albumin to creatinine ratio 6.  Labs 07/17/2022:  TSH normal at 1.260.  ROS  Review of Systems  Cardiovascular:  Positive for chest pain and palpitations. Negative for dyspnea on exertion and leg swelling.  Gastrointestinal:  Positive for heartburn.  Psychiatric/Behavioral:  Positive for depression and hypervigilance. Negative for suicidal ideas. The patient is nervous/anxious.    Physical Exam:   VS:  BP 126/62 (BP Location: Right Arm, Patient Position: Sitting, Cuff Size: Normal)   Pulse (!) 56   Ht 5' (1.524 m)   Wt 199 lb 9.6 oz (90.5 kg)  BMI 38.98 kg/m    Wt Readings from Last 3 Encounters:  03/19/24 199 lb 9.6 oz (90.5 kg)  08/14/23 201 lb (91.2 kg)  08/06/23 203 lb 12.8 oz (92.4 kg)    BP Readings from Last 3 Encounters:  03/19/24 126/62  08/14/23  (!) 141/61  08/06/23 128/70   Physical Exam Constitutional:      Appearance: She is obese.  Neck:     Vascular: No JVD.  Cardiovascular:     Rate and Rhythm: Normal rate and regular rhythm.     Pulses: Intact distal pulses.     Heart sounds: S1 normal and S2 normal. Murmur heard.     Early systolic murmur is present with a grade of 2/6 at the upper right sternal border.     No gallop.  Pulmonary:     Effort: Pulmonary effort is normal.     Breath sounds: Normal breath sounds.  Abdominal:     General: Bowel sounds are normal.     Palpations: Abdomen is soft.  Musculoskeletal:     Right lower leg: No edema.     Left lower leg: No edema.    EKG:    EKG Interpretation Date/Time:  Thursday March 19 2024 10:15:07 EDT Ventricular Rate:  56 PR Interval:  152 QRS Duration:  120 QT Interval:  424 QTC Calculation: 409 R Axis:   -34  Text Interpretation: EKG 03/19/2024: Normal sinus rhythm at rate of 56 bpm, left anterior fascicular block.  IVCD, LVH.  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 Compared to 08/06/2023, no significant change. Confirmed by Terrin Imparato, Jagadeesh ([id number]) on 03/19/2024 10:25:54 AM    ASSESSMENT AND PLAN: .      ICD-10-CM   1. Coronary artery disease of native artery of native heart with stable angina pectoris  I25.118 EKG 12-Lead    MYOCARDIAL PERFUSION IMAGING    Cardiac Stress Test: Informed Consent Details: Physician/Practitioner Attestation; Transcribe to consent form and obtain patient signature    2. Essential hypertension  I10     3. Mild aortic stenosis  I35.0     4. Gastroesophageal reflux disease without esophagitis  K21.9      Assessment & Plan Gastroesophageal reflux disease (GERD) Intermittent chest pain and heart fluttering, primarily in the morning and at night, likely due to acid reflux. Symptoms improve by afternoon. Previous cessation of pantoprazole  due to concerns about memory loss. Current symptoms suggestive of GERD  exacerbation. - Prescribe pantoprazole  40 mg daily for two weeks on an empty stomach, then as needed for heartburn. - Educate on taking pantoprazole  an hour before meals. - Ensure prescription is available for pickup.  Coronary artery disease Previous cardiac catheterization in 2019 showed minor blockages in small arteries. Current symptoms not indicative of cardiac origin. EKG and physical exam unchanged. Plan to rule out cardiac involvement due to diabetes history. - Order stress test to evaluate cardiac function.  Caregiver fatigue Significant stress and fatigue from caregiving responsibilities, impacting mental health and daily activities. Emphasized the need for behavioral medicine consultation to prevent burnout and improve coping strategies. - Refer to behavioral medicine for psychological support and coping strategies.  Type 2 diabetes mellitus, well controlled Diabetes is well controlled with current medication regimen. Recent lab results show normal kidney function and well-controlled cholesterol levels.  Mild aortic stenosis Mild aortic stenosis with no significant change in condition. Current examination does not indicate worsening of the valve condition. - Last echocardiogram in 2022.  Normal LVEF, mild AS.  Do not think she needs repeat echocardiogram.  Follow-Up - Schedule stress test. - Coordinate behavioral medicine referral with primary care provider. - Follow up with primary care provider on October 29.   Follow up: As needed unless stress test is abnormal she is on appropriate medical therapy, lipids are well-controlled, diabetes is well-controlled and blood pressure is well-controlled.  Her medications were reviewed.  Daughter present and all questions answered.  Signed,  Gordy Bergamo, MD, [facility] 03/19/2024, 9:14 PM [facility] [address] [place], [place] [address] Phone: [phone]. Fax:  [phone]

## 2024-03-19 NOTE — Patient Instructions (Signed)
 Medication Instructions:  Your physician recommends that you continue on your current medications as directed. Please refer to the Current Medication list given to you today.  *If you need a refill on your cardiac medications before your next appointment, please call your pharmacy*  Lab Work: none If you have labs (blood work) drawn today and your tests are completely normal, you will receive your results only by: MyChart Message (if you have MyChart) OR A paper copy in the mail If you have any lab test that is abnormal or we need to change your treatment, we will call you to review the results.  Testing/Procedures: Your physician has requested that you have a lexiscan  myoview . For further information please visit [email or web address] Please follow instruction sheet, as given.   Follow-Up: At [facility], you and your health needs are our priority.  As part of our continuing mission to provide you with exceptional heart care, our providers are all part of one team.  This team includes your primary Cardiologist (physician) and Advanced Practice Providers or APPs (Physician Assistants and Nurse Practitioners) who all work together to provide you with the care you need, when you need it.  Your next appointment:   As needed  Provider:   Dr. Ladona  We recommend signing up for the patient portal called MyChart.  Sign up information is provided on this After Visit Summary.  MyChart is used to connect with patients for Virtual Visits (Telemedicine).  Patients are able to view lab/test results, encounter notes, upcoming appointments, etc.  Non-urgent messages can be sent to your provider as well.   To learn more about what you can do with MyChart, go to [email or web address].   Other Instructions none

## 2024-03-25 ENCOUNTER — Other Ambulatory Visit: Payer: Self-pay | Admitting: Cardiology

## 2024-03-25 DIAGNOSIS — I25118 Atherosclerotic heart disease of native coronary artery with other forms of angina pectoris: Secondary | ICD-10-CM

## 2024-03-26 ENCOUNTER — Ambulatory Visit (HOSPITAL_COMMUNITY)
Admission: RE | Admit: 2024-03-26 | Discharge: 2024-03-26 | Disposition: A | Discharge status: Home / Self Care | Source: Ambulatory Visit | Attending: Cardiology | Admitting: Cardiology

## 2024-03-26 DIAGNOSIS — I25118 Atherosclerotic heart disease of native coronary artery with other forms of angina pectoris: Secondary | ICD-10-CM | POA: Insufficient documentation

## 2024-03-26 LAB — MYOCARDIAL PERFUSION IMAGING
Base ST Depression (mm): 0 mm
LV dias vol: 119 mL (ref 46–106)
LV sys vol: 59 mL (ref 3.8–5.2)
Nuc Stress EF: 50 %
Peak HR: 76 {beats}/min
Rest HR: 60 {beats}/min
Rest Nuclear Isotope Dose: 10.9 mCi
SDS: 3
SRS: 2
SSS: 5
ST Depression (mm): 0 mm
Stress Nuclear Isotope Dose: 30.4 mCi
TID: 0.98

## 2024-03-26 MED ORDER — TECHNETIUM TC 99M TETROFOSMIN IV KIT
30.4000 | PACK | Freq: Once | INTRAVENOUS | Status: AC | PRN
  Administered 2024-03-26: 30.4 via INTRAVENOUS

## 2024-03-26 MED ORDER — REGADENOSON 0.4 MG/5ML IV SOLN
0.4000 mg | Freq: Once | INTRAVENOUS | Status: AC
  Administered 2024-03-26: 0.4 mg via INTRAVENOUS

## 2024-03-26 MED ORDER — REGADENOSON 0.4 MG/5ML IV SOLN
INTRAVENOUS | Status: AC
  Filled 2024-03-26: qty 5

## 2024-03-26 MED ORDER — TECHNETIUM TC 99M TETROFOSMIN IV KIT
10.9000 | PACK | Freq: Once | INTRAVENOUS | Status: AC | PRN
  Administered 2024-03-26: 10.9 via INTRAVENOUS

## 2024-03-27 ENCOUNTER — Ambulatory Visit: Payer: Self-pay | Admitting: Cardiology

## 2024-03-27 NOTE — Progress Notes (Signed)
 Nuclear stress test is abnormal suggesting mid to distal anterior wall ischemia and also significant three-vessel coronary calcification.  In view of her being on best medical therapy and in spite of is having shortness of breath, chest pain and palpitations, would recommend cardiac catheterization.  Schedule for cardiac catheterization, and possible angioplasty. We discussed regarding risks, benefits, alternatives to this including stress testing, CTA and continued medical therapy. Patient wants to proceed. Understands <1-2% risk of death, stroke, MI, urgent CABG, bleeding, infection, renal failure but not limited to these.  She has had cardiac catheterization in 2019 and had shown mild disease.  If she is willing please schedule her for cardiac catheterization.  If she still has more questions happy to answer her.  I had discussed this with the patient and her daughter on her office visit recently.

## 2024-03-30 ENCOUNTER — Telehealth: Payer: Self-pay | Admitting: Cardiology

## 2024-03-30 ENCOUNTER — Ambulatory Visit: Attending: Internal Medicine | Admitting: Physician Assistant

## 2024-03-30 ENCOUNTER — Encounter: Payer: Self-pay | Admitting: Physician Assistant

## 2024-03-30 ENCOUNTER — Other Ambulatory Visit (HOSPITAL_COMMUNITY): Payer: Self-pay

## 2024-03-30 VITALS — BP 127/75 | HR 70 | Ht 60.0 in | Wt 197.0 lb

## 2024-03-30 DIAGNOSIS — I2089 Other forms of angina pectoris: Secondary | ICD-10-CM

## 2024-03-30 DIAGNOSIS — I350 Nonrheumatic aortic (valve) stenosis: Secondary | ICD-10-CM | POA: Diagnosis not present

## 2024-03-30 DIAGNOSIS — I25118 Atherosclerotic heart disease of native coronary artery with other forms of angina pectoris: Secondary | ICD-10-CM | POA: Diagnosis not present

## 2024-03-30 DIAGNOSIS — E119 Type 2 diabetes mellitus without complications: Secondary | ICD-10-CM

## 2024-03-30 MED ORDER — PREDNISONE 50 MG PO TABS: ORAL_TABLET | ORAL | 0 refills | Status: DC

## 2024-03-30 MED ORDER — PREDNISONE 50 MG PO TABS
ORAL_TABLET | ORAL | 0 refills | Status: AC
Start: 2024-03-30 — End: ?
  Filled 2024-03-30: qty 3, 1d supply, fill #0
  Filled 2024-03-30: qty 3, 2d supply, fill #0

## 2024-03-30 NOTE — Telephone Encounter (Signed)
 Patient stated that Ladona was going to refer her to a councilor to help with her stress level.  Please reach out to discuss.  Thank you.

## 2024-03-30 NOTE — Patient Instructions (Addendum)
 Medication Instructions:  No changes  *If you need a refill on your cardiac medications before your next appointment, please call your pharmacy*   Lab Work: BMP CBC If you have labs (blood work) drawn today and your tests are completely normal, you will receive your results only by: MyChart Message (if you have MyChart) OR A paper copy in the mail If you have any lab test that is abnormal or we need to change your treatment, we will call you to review the results.   Testing/Procedures: Your physician has requested that you have a cardiac catheterization. Cardiac catheterization is used to diagnose and/or treat various heart conditions. Doctors may recommend this procedure for a number of different reasons. The most common reason is to evaluate chest pain. Chest pain can be a symptom of coronary artery disease (CAD), and cardiac catheterization can show whether plaque is narrowing or blocking your heart's arteries. This procedure is also used to evaluate the valves, as well as measure the blood flow and oxygen levels in different parts of your heart. For further information please visit [email or web address] Please follow instruction sheet, as given.    Follow-Up: At [facility], you and your health needs are our priority.  As part of our continuing mission to provide you with exceptional heart care, we have created designated Provider Care Teams.  These Care Teams include your primary Cardiologist (physician) and Advanced Practice Providers (APPs -  Physician Assistants and Nurse Practitioners) who all work together to provide you with the care you need, when you need it.     Your next appointment:   2 to 3  week(s)  The format for your next appointment:   In Person  Provider:   Gordy Bergamo, MD- If Dr Bergamo not available can see EMERSON Pavy PA   Other Instructions    [facility] A DEPT OF [place]. [facility] [address] A DEPT OF THE [place]. [place]  [facility] [address] [place] [address] Dept: [phone] Loc: [phone]  Julie Graves  03/30/2024  You are scheduled for a Cardiac Catheterization on Wednesday, October 8 with Dr. Gordy Bergamo.  1. Please arrive at the [facility] (Main Entrance A) at [facility]: [address] [place], [place] [address] at 11:00 AM (This time is 2 hour(s) before your procedure to ensure your preparation).   Free valet parking service is available. You will check in at ADMITTING. The support person will be asked to wait in the waiting room.  It is OK to have someone drop you off and come back when you are ready to be discharged.    Special note: Every effort is made to have your procedure done on time. Please understand that emergencies sometimes delay scheduled procedures.  2. Diet: Light meals may be eaten up to 6 hours before scheduled procedures from 12N and after; please stop eating at 7:00 AM   Light meal consist of plain toast, fruit, light soups, crackers.   3. Hydration: You need to be well hydrated before your procedure. On October 8, you may drink approved liquids (see below) until 2 hours before the procedure, with 16 oz of water as your last intake.   List of approved liquids water, clear juice, clear tea, black coffee, fruit juices, non-citric and without pulp, carbonated beverages, Gatorade, Kool -Aid, plain Jello-O and plain ice popsicles.  4. Labs: You will need to have blood drawn on( CBC, BMP), October 6  at [facility] (1st Floor), [address] [place], [place] [address]. You do not need to be fasting.  5. Medication instructions in preparation for your procedure:   Contrast Allergy: Yes, Please take Prednisone  50mg  by mouth at: Thirteen hours prior to cath 11:00pm on Tuesday Seven hours prior to cath 5:00am on Wednesday And prior to leaving home please take last dose of Prednisone  50mg  and Benadryl  50mg  by  mouth. 11:am     Stop taking Metformin    the day of the procedure and 48 hours after.     On the morning of your procedure, take your Aspirin  81 mg and any morning medicines NOT listed above.  You may use sips of water.  6. Plan to go home the same day, you will only stay overnight if medically necessary. 7. Bring a current list of your medications and current insurance cards. 8. You MUST have a responsible person to drive you home. 9. Someone MUST be with you the first 24 hours after you arrive home or your discharge will be delayed. 10. Please wear clothes that are easy to get on and off and wear slip-on shoes.  Thank you for allowing us  to care for you!   --  Invasive Cardiovascular services

## 2024-03-30 NOTE — Telephone Encounter (Signed)
 I sent a message to her PCP to do that

## 2024-03-30 NOTE — Progress Notes (Signed)
 Cardiology Office Note:  .   Date:  03/30/2024  ID:  Julie Graves, DOB 10/20/1940, MRN [id number] PCP: Juliane Che, PA   [facility]Care Providers Cardiologist:  Gordy Bergamo, MD    History of Present Illness: .   Julie Graves is a 83 y.o. female  with morbid obesity, nonischemic cardiomyopathy with EF 40% by echo in June 2019 but normalized by medical therapy by echocardiogram on 08/23/2020 which also revealed mild aortic stenosis, CAD by cardiac catheterization on 08/21/17 and revealed mid circumflex 50% and small with 2 distal diagonal 80% stenosis, mild aortic insufficiency and aortic stenosis, diabetes, hypertension, hyperlipidemia, former tobacco use with 15-pack-year history.      She saw Dr. Bergamo 03/19/24 with chest pain and Nuclear stress test is abnormal suggesting mid to distal anterior wall ischemia and also significant three-vessel coronary calcification. In view of her being on best medical therapy and in spite of is having shortness of breath, chest pain and palpitations, would recommend cardiac catheterization. She has an iodine allergy  ROS:    Studies Reviewed: SABRA         Prior CV Studies: GATED SPECT MYO PERF W/LEXISCAN  STRESS 1D 03/26/2024  Interpretation Summary   Stress ECG nondiagnostic for ischemia, pharmacological stress.   Medium sized, mild intensity, reversible perfusion defect suggestive of ischemia in the mid to distal LAD distribution.  Small sized, mild intensity, fixed perfusion defect at the apex likely secondary to artifact.   Calculated LVEF 50%, no regional wall motion abnormalities, wall thickness preserved, LV cavity size normal.   Coronary calcium  was present on the attenuation correction CT images. Severe coronary calcifications were present. Coronary calcifications were present in the left anterior descending artery, left circumflex artery and right coronary artery distribution(s).  Aortic atherosclerosis.   Prior study available for  comparison from 11/05/2013. Low risk study (per report)   Intermediate risk study.       Risk Assessment/Calculations:             Physical Exam:   VS:  BP 127/75 (BP Location: Left Arm, Patient Position: Sitting, Cuff Size: Large)   Pulse 70   Ht 5' (1.524 m)   Wt 197 lb (89.4 kg)   SpO2 98%   BMI 38.47 kg/m    Orhtostatics: No data found. Wt Readings from Last 3 Encounters:  03/30/24 197 lb (89.4 kg)  03/26/24 199 lb (90.3 kg)  03/19/24 199 lb 9.6 oz (90.5 kg)    GEN: Obese, in no acute distress NECK: No JVD; No carotid bruits CARDIAC:  RRR,2/6 systolic murmur LSB RESPIRATORY:  Clear to auscultation without rales, wheezing or rhonchi  ABDOMEN: Soft, non-tender, non-distended EXTREMITIES:  No edema; No deformity   ASSESSMENT AND PLAN: .    Chest pain with abnormal NST as above. Dr. Bergamo recommends LHC. Patient agreeable to proceed. Iodine allergy-will premedicate with prednisone . Check bmet and bnp today. Hold metformin  am of cath and 2 days after.  I have reviewed the risks, indications, and alternatives to angioplasty and stenting with the patient. Risks include but are not limited to bleeding, infection, vascular injury, stroke, myocardial infection, arrhythmia, kidney injury, radiation-related injury in the case of prolonged fluoroscopy use, emergency cardiac surgery, and death. The patient understands the risks of serious complication is low (<1%) and patient agrees to proceed.   Coronary artery disease Previous cardiac catheterization in 2019 showed blockages in small arteries. NST abnormal see above.   Type 2 diabetes mellitus, well controlled Diabetes is  well controlled with current medication regimen.   Mild aortic stenosis Mild aortic stenosis with no significant change in condition.  - Last echocardiogram in 2022.  Normal LVEF, mild AS.             Informed Consent   Shared Decision Making/Informed Consent The risks [stroke (1 in 1000), death (1 in  1000), kidney failure [usually temporary] (1 in 500), bleeding (1 in 200), allergic reaction [possibly serious] (1 in 200)], benefits (diagnostic support and management of coronary artery disease) and alternatives of a cardiac catheterization were discussed in detail with Julie Graves and she is willing to proceed.     Dispo: f/u after cath.   Signed, Olivia Pavy, PA-C

## 2024-03-30 NOTE — Telephone Encounter (Signed)
 Called and spoke with patient's daughter Raven FurnasGLENWOOD Molt per verbal permission from patient. Patient's daughter had many questions regarding patient's stress test and heart catheterization procedure. Scheduled patient to be seen by Olivia Pavy PA-C today, 03/30/24, at 12:15 pm. Patient's daughter requested that heart catheterization be scheduled for 04/01/24. Advised patient's daughter that cath lab would be called to schedule heart catheterization for 04/01/2024 and patient will be given instructions for procedure at her appointment today. Patient's daughter verbalized understanding and agreeable to plan. Cath lab called and procedure scheduled for 04/01/2024 at 1:00 pm. Olivia Pavy PA-C notified.

## 2024-03-30 NOTE — H&P (View-Only) (Signed)
 Cardiology Office Note:  .   Date:  03/30/2024  ID:  AVIELLE IMBERT, DOB 10/20/1940, MRN [id number] PCP: Juliane Che, PA   [facility] Providers Cardiologist:  Gordy Bergamo, MD    History of Present Illness: .   Julie Graves is a 83 y.o. female  with morbid obesity, nonischemic cardiomyopathy with EF 40% by echo in June 2019 but normalized by medical therapy by echocardiogram on 08/23/2020 which also revealed mild aortic stenosis, CAD by cardiac catheterization on 08/21/17 and revealed mid circumflex 50% and small with 2 distal diagonal 80% stenosis, mild aortic insufficiency and aortic stenosis, diabetes, hypertension, hyperlipidemia, former tobacco use with 15-pack-year history.      She saw Dr. Bergamo 03/19/24 with chest pain and Nuclear stress test is abnormal suggesting mid to distal anterior wall ischemia and also significant three-vessel coronary calcification. In view of her being on best medical therapy and in spite of is having shortness of breath, chest pain and palpitations, would recommend cardiac catheterization. She has an iodine allergy  ROS:    Studies Reviewed: SABRA         Prior CV Studies: GATED SPECT MYO PERF W/LEXISCAN  STRESS 1D 03/26/2024  Interpretation Summary   Stress ECG nondiagnostic for ischemia, pharmacological stress.   Medium sized, mild intensity, reversible perfusion defect suggestive of ischemia in the mid to distal LAD distribution.  Small sized, mild intensity, fixed perfusion defect at the apex likely secondary to artifact.   Calculated LVEF 50%, no regional wall motion abnormalities, wall thickness preserved, LV cavity size normal.   Coronary calcium  was present on the attenuation correction CT images. Severe coronary calcifications were present. Coronary calcifications were present in the left anterior descending artery, left circumflex artery and right coronary artery distribution(s).  Aortic atherosclerosis.   Prior study available for  comparison from 11/05/2013. Low risk study (per report)   Intermediate risk study.       Risk Assessment/Calculations:             Physical Exam:   VS:  BP 127/75 (BP Location: Left Arm, Patient Position: Sitting, Cuff Size: Large)   Pulse 70   Ht 5' (1.524 m)   Wt 197 lb (89.4 kg)   SpO2 98%   BMI 38.47 kg/m    Orhtostatics: No data found. Wt Readings from Last 3 Encounters:  03/30/24 197 lb (89.4 kg)  03/26/24 199 lb (90.3 kg)  03/19/24 199 lb 9.6 oz (90.5 kg)    GEN: Obese, in no acute distress NECK: No JVD; No carotid bruits CARDIAC:  RRR,2/6 systolic murmur LSB RESPIRATORY:  Clear to auscultation without rales, wheezing or rhonchi  ABDOMEN: Soft, non-tender, non-distended EXTREMITIES:  No edema; No deformity   ASSESSMENT AND PLAN: .    Chest pain with abnormal NST as above. Dr. Bergamo recommends LHC. Patient agreeable to proceed. Iodine allergy-will premedicate with prednisone . Check bmet and bnp today. Hold metformin  am of cath and 2 days after.  I have reviewed the risks, indications, and alternatives to angioplasty and stenting with the patient. Risks include but are not limited to bleeding, infection, vascular injury, stroke, myocardial infection, arrhythmia, kidney injury, radiation-related injury in the case of prolonged fluoroscopy use, emergency cardiac surgery, and death. The patient understands the risks of serious complication is low (<1%) and patient agrees to proceed.   Coronary artery disease Previous cardiac catheterization in 2019 showed blockages in small arteries. NST abnormal see above.   Type 2 diabetes mellitus, well controlled Diabetes is  well controlled with current medication regimen.   Mild aortic stenosis Mild aortic stenosis with no significant change in condition.  - Last echocardiogram in 2022.  Normal LVEF, mild AS.             Informed Consent   Shared Decision Making/Informed Consent The risks [stroke (1 in 1000), death (1 in  1000), kidney failure [usually temporary] (1 in 500), bleeding (1 in 200), allergic reaction [possibly serious] (1 in 200)], benefits (diagnostic support and management of coronary artery disease) and alternatives of a cardiac catheterization were discussed in detail with Ms. Upson and she is willing to proceed.     Dispo: f/u after cath.   Signed, Olivia Pavy, PA-C

## 2024-03-31 ENCOUNTER — Ambulatory Visit: Payer: Self-pay | Admitting: Cardiology

## 2024-03-31 ENCOUNTER — Ambulatory Visit: Payer: Self-pay | Admitting: Physician Assistant

## 2024-03-31 DIAGNOSIS — E041 Nontoxic single thyroid nodule: Secondary | ICD-10-CM

## 2024-03-31 LAB — BASIC METABOLIC PANEL WITH GFR
BUN/Creatinine Ratio: 29 — ABNORMAL HIGH (ref 12–28)
BUN: 20 mg/dL (ref 8–27)
CO2: 24 mmol/L (ref 20–29)
Calcium: 10.1 mg/dL (ref 8.7–10.3)
Chloride: 101 mmol/L (ref 96–106)
Creatinine, Ser: 0.69 mg/dL (ref 0.57–1.00)
Glucose: 88 mg/dL (ref 70–99)
Potassium: 4.5 mmol/L (ref 3.5–5.2)
Sodium: 137 mmol/L (ref 134–144)
eGFR: 86 mL/min/1.73 (ref >59)

## 2024-03-31 LAB — CBC
Hematocrit: 39 % (ref 34.0–46.6)
Hemoglobin: 12.5 g/dL (ref 11.1–15.9)
MCH: 31.5 pg (ref 26.6–33.0)
MCHC: 32.1 g/dL (ref 31.5–35.7)
MCV: 98 fL — ABNORMAL HIGH (ref 79–97)
Platelets: 233 x10E3/uL (ref 150–450)
RBC: 3.97 x10E6/uL (ref 3.77–5.28)
RDW: 12 % (ref 11.7–15.4)
WBC: 6.3 x10E3/uL (ref 3.4–10.8)

## 2024-03-31 NOTE — Progress Notes (Signed)
 ICD-10-CM   1. Left thyroid nodule  E04.1 US  Soft Tissue Head/Neck     Orders Placed This Encounter  Procedures   US  Soft Tissue Head/Neck    Standing Status:   Future    Expiration Date:   03/31/2025    Reason for Exam (SYMPTOM  OR DIAGNOSIS REQUIRED):   Thyroid nodule    Preferred imaging location?:   [facility]    Call Results- Best Contact Number?:   Bernita Ned, PA    I placed this order due to nodule found incidentally see PET stress report

## 2024-04-01 ENCOUNTER — Ambulatory Visit (HOSPITAL_COMMUNITY)
Admission: RE | Admit: 2024-04-01 | Discharge: 2024-04-01 | Disposition: A | Discharge status: Home / Self Care | Attending: Cardiology | Admitting: Cardiology

## 2024-04-01 ENCOUNTER — Encounter (HOSPITAL_COMMUNITY)
Admission: RE | Disposition: A | Discharge status: Home / Self Care | Payer: Self-pay | Source: Home / Self Care | Attending: Cardiology

## 2024-04-01 ENCOUNTER — Other Ambulatory Visit: Payer: Self-pay

## 2024-04-01 DIAGNOSIS — I352 Nonrheumatic aortic (valve) stenosis with insufficiency: Secondary | ICD-10-CM | POA: Diagnosis not present

## 2024-04-01 DIAGNOSIS — I2510 Atherosclerotic heart disease of native coronary artery without angina pectoris: Secondary | ICD-10-CM | POA: Insufficient documentation

## 2024-04-01 DIAGNOSIS — I2089 Other forms of angina pectoris: Secondary | ICD-10-CM

## 2024-04-01 DIAGNOSIS — I428 Other cardiomyopathies: Secondary | ICD-10-CM | POA: Insufficient documentation

## 2024-04-01 DIAGNOSIS — E119 Type 2 diabetes mellitus without complications: Secondary | ICD-10-CM | POA: Diagnosis not present

## 2024-04-01 DIAGNOSIS — Z91041 Radiographic dye allergy status: Secondary | ICD-10-CM | POA: Diagnosis not present

## 2024-04-01 DIAGNOSIS — Z87891 Personal history of nicotine dependence: Secondary | ICD-10-CM | POA: Diagnosis not present

## 2024-04-01 HISTORY — PX: CORONARY ANGIOGRAPHY: CATH118303

## 2024-04-01 LAB — GLUCOSE, CAPILLARY: Glucose-Capillary: 136 mg/dL — ABNORMAL HIGH (ref 70–99)

## 2024-04-01 SURGERY — CORONARY ANGIOGRAPHY (CATH LAB)
Anesthesia: LOCAL

## 2024-04-01 MED ORDER — MIDAZOLAM HCL 2 MG/2ML IJ SOLN
INTRAMUSCULAR | Status: DC | PRN
  Administered 2024-04-01: 1 mg via INTRAVENOUS

## 2024-04-01 MED ORDER — IOHEXOL 350 MG/ML SOLN
INTRAVENOUS | Status: DC | PRN
  Administered 2024-04-01: 55 mL

## 2024-04-01 MED ORDER — VERAPAMIL HCL 2.5 MG/ML IV SOLN
INTRAVENOUS | Status: AC
  Filled 2024-04-01: qty 2

## 2024-04-01 MED ORDER — SODIUM CHLORIDE 0.9 % IV SOLN: 250.0000 mL | INTRAVENOUS | Status: DC | PRN

## 2024-04-01 MED ORDER — FENTANYL CITRATE (PF) 100 MCG/2ML IJ SOLN
INTRAMUSCULAR | Status: AC
  Filled 2024-04-01: qty 2

## 2024-04-01 MED ORDER — HEPARIN SODIUM (PORCINE) 1000 UNIT/ML IJ SOLN
INTRAMUSCULAR | Status: AC
  Filled 2024-04-01: qty 10

## 2024-04-01 MED ORDER — LIDOCAINE HCL (PF) 1 % IJ SOLN
INTRAMUSCULAR | Status: AC
  Filled 2024-04-01: qty 30

## 2024-04-01 MED ORDER — ACETAMINOPHEN 325 MG PO TABS: 650.0000 mg | ORAL_TABLET | ORAL | Status: DC | PRN

## 2024-04-01 MED ORDER — SODIUM CHLORIDE 0.9% FLUSH: 3.0000 mL | Freq: Two times a day (BID) | INTRAVENOUS | Status: DC

## 2024-04-01 MED ORDER — FENTANYL CITRATE (PF) 100 MCG/2ML IJ SOLN
INTRAMUSCULAR | Status: DC | PRN
  Administered 2024-04-01 (×3): 25 ug via INTRAVENOUS

## 2024-04-01 MED ORDER — ASPIRIN 81 MG PO CHEW: 81.0000 mg | CHEWABLE_TABLET | ORAL | Status: DC

## 2024-04-01 MED ORDER — HEPARIN (PORCINE) IN NACL 1000-0.9 UT/500ML-% IV SOLN
INTRAVENOUS | Status: DC | PRN
  Administered 2024-04-01: 1000 mL via SURGICAL_CAVITY

## 2024-04-01 MED ORDER — LIDOCAINE HCL (PF) 1 % IJ SOLN
INTRAMUSCULAR | Status: DC | PRN
  Administered 2024-04-01: 2 mL

## 2024-04-01 MED ORDER — HEPARIN SODIUM (PORCINE) 1000 UNIT/ML IJ SOLN
INTRAMUSCULAR | Status: DC | PRN
  Administered 2024-04-01: 3000 [IU] via INTRAVENOUS

## 2024-04-01 MED ORDER — MIDAZOLAM HCL 2 MG/2ML IJ SOLN
INTRAMUSCULAR | Status: AC
  Filled 2024-04-01: qty 2

## 2024-04-01 MED ORDER — HYDRALAZINE HCL 20 MG/ML IJ SOLN: 10.0000 mg | INTRAMUSCULAR | Status: DC | PRN

## 2024-04-01 MED ORDER — FREE WATER: 500.0000 mL | Freq: Once | Status: DC

## 2024-04-01 MED ORDER — ONDANSETRON HCL 4 MG/2ML IJ SOLN: 4.0000 mg | Freq: Four times a day (QID) | INTRAMUSCULAR | Status: DC | PRN

## 2024-04-01 MED ORDER — SODIUM CHLORIDE 0.9% FLUSH: 3.0000 mL | INTRAVENOUS | Status: DC | PRN

## 2024-04-01 MED ORDER — VERAPAMIL HCL 2.5 MG/ML IV SOLN
INTRAVENOUS | Status: DC | PRN
  Administered 2024-04-01: 10 mL via INTRA_ARTERIAL

## 2024-04-01 MED ORDER — LABETALOL HCL 5 MG/ML IV SOLN: 10.0000 mg | INTRAVENOUS | Status: DC | PRN

## 2024-04-01 MED ORDER — FREE WATER
500.0000 mL | Freq: Once | Status: AC
  Administered 2024-04-01: 500 mL via ORAL

## 2024-04-01 SURGICAL SUPPLY — 13 items
CATH INFINITI 5 FR JL3.5 (CATHETERS) IMPLANT
CATH INFINITI AMBI 5FR TG (CATHETERS) IMPLANT
CATH INFINITI MULTIPACK ANG 4F (CATHETERS) IMPLANT
CATH LAUNCHER 5F AL1 (CATHETERS) IMPLANT
DEVICE RAD TR BAND REGULAR (VASCULAR PRODUCTS) IMPLANT
GLIDESHEATH SLEND A-KIT 6F 22G (SHEATH) IMPLANT
GUIDEWIRE INQWIRE 1.5J.035X260 (WIRE) IMPLANT
PACK CARDIAC CATHETERIZATION (CUSTOM PROCEDURE TRAY) ×1 IMPLANT
SET ATX-X65L (MISCELLANEOUS) IMPLANT
SHEATH 6FR 75 DEST SLENDER (SHEATH) IMPLANT
SHEATH PINNACLE 5F 10CM (SHEATH) IMPLANT
SHEATH PROBE COVER 6X72 (BAG) IMPLANT
WIRE MICRO SET SILHO 5FR 7 (SHEATH) IMPLANT

## 2024-04-01 NOTE — Discharge Instructions (Signed)

## 2024-04-01 NOTE — Progress Notes (Signed)
 TR Band removed no s/s of complications at incision site. PT ambulated to the bathroom was able to void without difficulty. Discharge instructions reviewed with patient denies questions or concerns verbalizes understanding. PT escorted from the unit via wheel chair to personal vehicle.

## 2024-04-01 NOTE — Interval H&P Note (Signed)
 History and Physical Interval Note:  04/01/2024 1:54 PM  Julie Graves  has presented today for surgery, with the diagnosis of cad.  The various methods of treatment have been discussed with the patient and family. After consideration of risks, benefits and other options for treatment, the patient has consented to  Procedure(s): LEFT HEART CATH AND CORONARY ANGIOGRAPHY (N/A) and possible coronary angioplasty as a surgical intervention.  The patient's history has been reviewed, patient examined, no change in status, stable for surgery.  I have reviewed the patient's chart and labs.  Questions were answered to the patient's satisfaction.     Gordy Bergamo

## 2024-04-02 ENCOUNTER — Encounter (HOSPITAL_COMMUNITY): Payer: Self-pay | Admitting: Cardiology

## 2024-04-06 ENCOUNTER — Ambulatory Visit
Admission: RE | Admit: 2024-04-06 | Discharge: 2024-04-06 | Disposition: A | Source: Ambulatory Visit | Attending: Cardiology | Admitting: Cardiology

## 2024-04-06 DIAGNOSIS — E041 Nontoxic single thyroid nodule: Secondary | ICD-10-CM

## 2024-04-07 ENCOUNTER — Ambulatory Visit: Payer: Self-pay | Admitting: Cardiology

## 2024-04-07 NOTE — Progress Notes (Signed)
 Please let patient know to follow up with PCP regarding the abnormal thyroid scan. If they do not hear from them then to contact us . I have forwarded the results to them

## 2024-04-07 NOTE — Progress Notes (Signed)
 Cardiology Office Note:  .   Date:  04/21/2024  ID:  Julie Graves, DOB [birth year], MRN [id number] PCP: Juliane Che, PA  [facility] Providers Cardiologist:  Gordy Bergamo, MD    History of Present Illness: .   Julie Graves is a 83 y.o. female with morbid obesity, nonischemic cardiomyopathy with EF 40% by echo in June 2019 but normalized by medical therapy by echocardiogram on 08/23/2020 which also revealed mild aortic stenosis, CAD by cardiac catheterization on 08/21/17 and revealed mid circumflex 50% and small with 2 distal diagonal 80% stenosis, mild aortic insufficiency and aortic stenosis, diabetes, hypertension, hyperlipidemia, former tobacco use with 15-pack-year history.       She saw Dr. Bergamo 03/19/24 with chest pain and Nuclear stress test is abnormal suggesting mid to distal anterior wall ischemia and also significant three-vessel coronary calcification. In view of her being on best medical therapy and in spite of is having shortness of breath, chest pain and palpitations, would recommend cardiac catheterization. She has an iodine allergy. Cath unchanged from 2019. False positive stress test.  Patient comes in for f/u with her husband who I'm also seeing and her daughter. Patient has no further chest pain. Concerned about thyroid nodules and has appt with PCP tomorrow.    ROS:    Studies Reviewed: SABRA         Prior CV Studies:   Cath 04/01/24 Cardiac Catheterization 04/01/24: LM: Large-caliber vessel, widely patent. LAD: Large vessel, and is just before the apex, gives origin to moderate-sized D1 with a mid 50% stenosis and a s secondary small uperior branch has a focal 80% stenosis.  Minimal disease in the LAD. LCx: Gives origin to moderate-sized OM1 OM 2, mid Cx has a 40 to 50% stenosis. RCA: Large-caliber vessel, very minimal disease is evident in the RCA.      Impression and recommendations: Cardiac catheterization reveals coronary anatomy unchanged from  08/15/2017.  False-positive stress test.  GATED SPECT MYO PERF W/LEXISCAN  STRESS 1D 03/26/2024   Interpretation Summary   Stress ECG nondiagnostic for ischemia, pharmacological stress.   Medium sized, mild intensity, reversible perfusion defect suggestive of ischemia in the mid to distal LAD distribution.  Small sized, mild intensity, fixed perfusion defect at the apex likely secondary to artifact.   Calculated LVEF 50%, no regional wall motion abnormalities, wall thickness preserved, LV cavity size normal.   Coronary calcium  was present on the attenuation correction CT images. Severe coronary calcifications were present. Coronary calcifications were present in the left anterior descending artery, left circumflex artery and right coronary artery distribution(s).  Aortic atherosclerosis.   Prior study available for comparison from 11/05/2013. Low risk study (per report)   Intermediate risk study.       Risk Assessment/Calculations:             Physical Exam:   VS:  BP 126/64   Pulse 62   Ht 5' (1.524 m)   Wt 194 lb 11.2 oz (88.3 kg)   SpO2 98%   BMI 38.02 kg/m    Orhtostatics: No data found. Wt Readings from Last 3 Encounters:  04/21/24 194 lb 11.2 oz (88.3 kg)  04/01/24 192 lb (87.1 kg)  03/30/24 197 lb (89.4 kg)    GEN: Well nourished, well developed in no acute distress NECK: No JVD; No carotid bruits CARDIAC:  RRR, 2/6 systolic murmur LSB RESPIRATORY:  Clear to auscultation without rales, wheezing or rhonchi  ABDOMEN: Soft, non-tender, non-distended EXTREMITIES:  right wrist at  cath site stable without hematoma or hemorrhage, good radial and brachial pulse. No edema; No deformity   ASSESSMENT AND PLAN: .    Chest pain with abnormal NST felt to be false positive.  LHC no change from 2019. No further chest pain. Continue risk reduction.    Coronary artery disease Previous cardiac catheterization in 2019 showed blockages in small arteries. NST abnormal see above. Repeat cath  04/01/24 unchanged   Type 2 diabetes mellitus, well controlled Diabetes is well controlled with current medication regimen.   Mild aortic stenosis Mild aortic stenosis with no significant change in condition.  - Last echocardiogram in 2022.  Normal LVEF, mild AS.    Thyroid nodule solid needs biopsy-f/u with PCP        Dispo: f/u Dr. Ladona 6 months.  Signed, Olivia Pavy, PA-C

## 2024-04-13 ENCOUNTER — Telehealth: Payer: Self-pay | Admitting: Cardiology

## 2024-04-13 NOTE — Telephone Encounter (Signed)
 Pt's daughter would like a c/b regarding pt's records regarding Thyroid issue being sent to pt's PCP. Fax number to PCP is below. Please advise  Fax - [phone]

## 2024-04-13 NOTE — Telephone Encounter (Signed)
 Daughter wants a call back to discuss patient's thyroid issue and stated patient's PCP has not received patient's last office visit.

## 2024-04-21 ENCOUNTER — Encounter: Payer: Self-pay | Admitting: Physician Assistant

## 2024-04-21 ENCOUNTER — Ambulatory Visit: Attending: Physician Assistant | Admitting: Physician Assistant

## 2024-04-21 VITALS — BP 126/64 | HR 62 | Ht 60.0 in | Wt 194.7 lb

## 2024-04-21 DIAGNOSIS — E119 Type 2 diabetes mellitus without complications: Secondary | ICD-10-CM

## 2024-04-21 DIAGNOSIS — I25118 Atherosclerotic heart disease of native coronary artery with other forms of angina pectoris: Secondary | ICD-10-CM | POA: Diagnosis not present

## 2024-04-21 DIAGNOSIS — I350 Nonrheumatic aortic (valve) stenosis: Secondary | ICD-10-CM | POA: Diagnosis not present

## 2024-04-21 DIAGNOSIS — E041 Nontoxic single thyroid nodule: Secondary | ICD-10-CM

## 2024-04-21 DIAGNOSIS — R0789 Other chest pain: Secondary | ICD-10-CM

## 2024-04-21 NOTE — Patient Instructions (Signed)
 Medication Instructions:  Your physician recommends that you continue on your current medications as directed. Please refer to the Current Medication list given to you today.  *If you need a refill on your cardiac medications before your next appointment, please call your pharmacy*  Lab Work: None ordered  If you have labs (blood work) drawn today and your tests are completely normal, you will receive your results only by: MyChart Message (if you have MyChart) OR A paper copy in the mail If you have any lab test that is abnormal or we need to change your treatment, we will call you to review the results.  Testing/Procedures: None ordered.   Follow-Up: At [facility], you and your health needs are our priority.  As part of our continuing mission to provide you with exceptional heart care, our providers are all part of one team.  This team includes your primary Cardiologist (physician) and Advanced Practice Providers or APPs (Physician Assistants and Nurse Practitioners) who all work together to provide you with the care you need, when you need it.  Your next appointment:   6 months with Dr Ganji

## 2024-04-27 ENCOUNTER — Encounter: Payer: Self-pay | Admitting: Radiology

## 2024-07-17 ENCOUNTER — Other Ambulatory Visit: Payer: Self-pay | Admitting: Cardiology

## 2024-07-21 ENCOUNTER — Other Ambulatory Visit (HOSPITAL_COMMUNITY): Payer: Self-pay

## 2024-07-21 ENCOUNTER — Encounter (HOSPITAL_COMMUNITY): Payer: Self-pay

## 2024-07-25 ENCOUNTER — Encounter: Payer: Self-pay | Admitting: Cardiology

## 2024-07-27 MED ORDER — PANTOPRAZOLE SODIUM 40 MG PO TBEC: 40.0000 mg | DELAYED_RELEASE_TABLET | Freq: Every day | ORAL | 1 refills | Status: AC
